# Patient Record
Sex: Male | Born: 1950 | Race: White | Hispanic: No | Marital: Married | Smoking: Never smoker
Health system: Southern US, Community
[De-identification: ages and names within clinical notes are randomized; demographics above are authoritative.]

## PROBLEM LIST (undated history)

## (undated) DIAGNOSIS — M5136 Other intervertebral disc degeneration, lumbar region: Secondary | ICD-10-CM

## (undated) DIAGNOSIS — M51369 Other intervertebral disc degeneration, lumbar region without mention of lumbar back pain or lower extremity pain: Secondary | ICD-10-CM

## (undated) DIAGNOSIS — E669 Obesity, unspecified: Secondary | ICD-10-CM

## (undated) DIAGNOSIS — M1990 Unspecified osteoarthritis, unspecified site: Secondary | ICD-10-CM

## (undated) DIAGNOSIS — I2510 Atherosclerotic heart disease of native coronary artery without angina pectoris: Secondary | ICD-10-CM

## (undated) DIAGNOSIS — E119 Type 2 diabetes mellitus without complications: Secondary | ICD-10-CM

## (undated) DIAGNOSIS — Z955 Presence of coronary angioplasty implant and graft: Secondary | ICD-10-CM

## (undated) DIAGNOSIS — Z973 Presence of spectacles and contact lenses: Secondary | ICD-10-CM

## (undated) DIAGNOSIS — I739 Peripheral vascular disease, unspecified: Secondary | ICD-10-CM

## (undated) DIAGNOSIS — Z87820 Personal history of traumatic brain injury: Secondary | ICD-10-CM

## (undated) DIAGNOSIS — C801 Malignant (primary) neoplasm, unspecified: Secondary | ICD-10-CM

## (undated) DIAGNOSIS — I10 Essential (primary) hypertension: Secondary | ICD-10-CM

## (undated) DIAGNOSIS — I8390 Asymptomatic varicose veins of unspecified lower extremity: Secondary | ICD-10-CM

## (undated) DIAGNOSIS — Z794 Long term (current) use of insulin: Secondary | ICD-10-CM

## (undated) DIAGNOSIS — D494 Neoplasm of unspecified behavior of bladder: Secondary | ICD-10-CM

## (undated) DIAGNOSIS — E785 Hyperlipidemia, unspecified: Secondary | ICD-10-CM

## (undated) HISTORY — PX: TONSILLECTOMY: SUR1361

## (undated) HISTORY — PX: JOINT REPLACEMENT: SHX530

## (undated) HISTORY — DX: Essential (primary) hypertension: I10

## (undated) HISTORY — PX: KNEE ARTHROSCOPY: SUR90

## (undated) HISTORY — PX: OTHER SURGICAL HISTORY: SHX169

## (undated) HISTORY — DX: Unspecified osteoarthritis, unspecified site: M19.90

## (undated) HISTORY — PX: POSTERIOR LUMBAR FUSION: SHX6036

## (undated) HISTORY — PX: CORONARY ANGIOPLASTY WITH STENT PLACEMENT: SHX49

## (undated) HISTORY — PX: BACK SURGERY: SHX140

## (undated) HISTORY — PX: SHOULDER ARTHROSCOPY WITH ROTATOR CUFF REPAIR AND SUBACROMIAL DECOMPRESSION: SHX5686

## (undated) HISTORY — DX: Obesity, unspecified: E66.9

## (undated) HISTORY — PX: APPENDECTOMY: SHX54

## (undated) HISTORY — PX: CORONARY ANGIOPLASTY: SHX604

## (undated) HISTORY — DX: Atherosclerotic heart disease of native coronary artery without angina pectoris: I25.10

## (undated) HISTORY — PX: TOTAL KNEE ARTHROPLASTY: SHX125

## (undated) HISTORY — DX: Hyperlipidemia, unspecified: E78.5

---
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
## 1993-01-17 HISTORY — PX: VARICOSE VEIN SURGERY: SHX832

## 2002-03-22 ENCOUNTER — Encounter (INDEPENDENT_AMBULATORY_CARE_PROVIDER_SITE_OTHER): Payer: Self-pay | Admitting: *Deleted

## 2002-03-22 ENCOUNTER — Ambulatory Visit (HOSPITAL_COMMUNITY): Admission: RE | Admit: 2002-03-22 | Discharge: 2002-03-22 | Payer: Self-pay | Admitting: Gastroenterology

## 2004-01-22 ENCOUNTER — Ambulatory Visit (HOSPITAL_BASED_OUTPATIENT_CLINIC_OR_DEPARTMENT_OTHER): Admission: RE | Admit: 2004-01-22 | Discharge: 2004-01-22 | Payer: Self-pay | Admitting: Orthopedic Surgery

## 2004-01-22 ENCOUNTER — Ambulatory Visit (HOSPITAL_COMMUNITY): Admission: RE | Admit: 2004-01-22 | Discharge: 2004-01-22 | Payer: Self-pay | Admitting: Orthopedic Surgery

## 2004-02-25 ENCOUNTER — Ambulatory Visit: Payer: Self-pay | Admitting: Physical Medicine & Rehabilitation

## 2004-02-25 ENCOUNTER — Inpatient Hospital Stay (HOSPITAL_COMMUNITY): Admission: RE | Admit: 2004-02-25 | Discharge: 2004-02-28 | Payer: Self-pay | Admitting: Orthopedic Surgery

## 2007-10-04 ENCOUNTER — Ambulatory Visit: Payer: Self-pay | Admitting: Cardiology

## 2007-10-10 ENCOUNTER — Ambulatory Visit: Payer: Self-pay

## 2007-10-18 ENCOUNTER — Ambulatory Visit: Payer: Self-pay | Admitting: Cardiology

## 2007-10-18 LAB — CONVERTED CEMR LAB
Basophils Absolute: 0 10*3/uL (ref 0.0–0.1)
Basophils Relative: 0.5 % (ref 0.0–3.0)
Calcium: 9.1 mg/dL (ref 8.4–10.5)
Creatinine, Ser: 1.1 mg/dL (ref 0.4–1.5)
Eosinophils Absolute: 0.3 10*3/uL (ref 0.0–0.7)
GFR calc non Af Amer: 73 mL/min
HCT: 38.9 % — ABNORMAL LOW (ref 39.0–52.0)
Hemoglobin: 13.6 g/dL (ref 13.0–17.0)
MCHC: 35 g/dL (ref 30.0–36.0)
MCV: 89.9 fL (ref 78.0–100.0)
Neutro Abs: 3.3 10*3/uL (ref 1.4–7.7)
RBC: 4.32 M/uL (ref 4.22–5.81)
aPTT: 42.5 s — ABNORMAL HIGH (ref 21.7–29.8)

## 2007-10-22 ENCOUNTER — Inpatient Hospital Stay (HOSPITAL_BASED_OUTPATIENT_CLINIC_OR_DEPARTMENT_OTHER): Admission: RE | Admit: 2007-10-22 | Discharge: 2007-10-22 | Payer: Self-pay | Admitting: Cardiology

## 2007-10-22 ENCOUNTER — Ambulatory Visit: Payer: Self-pay | Admitting: Cardiology

## 2007-10-24 ENCOUNTER — Ambulatory Visit (HOSPITAL_COMMUNITY): Admission: RE | Admit: 2007-10-24 | Discharge: 2007-10-25 | Payer: Self-pay | Admitting: Cardiology

## 2007-10-24 ENCOUNTER — Ambulatory Visit: Payer: Self-pay | Admitting: Cardiology

## 2007-10-24 DIAGNOSIS — Z955 Presence of coronary angioplasty implant and graft: Secondary | ICD-10-CM

## 2007-10-24 HISTORY — DX: Presence of coronary angioplasty implant and graft: Z95.5

## 2007-11-08 ENCOUNTER — Ambulatory Visit: Payer: Self-pay | Admitting: Cardiology

## 2008-04-07 DIAGNOSIS — I10 Essential (primary) hypertension: Secondary | ICD-10-CM

## 2008-04-07 DIAGNOSIS — E785 Hyperlipidemia, unspecified: Secondary | ICD-10-CM

## 2008-04-07 DIAGNOSIS — R5383 Other fatigue: Secondary | ICD-10-CM

## 2008-04-07 DIAGNOSIS — E119 Type 2 diabetes mellitus without complications: Secondary | ICD-10-CM

## 2008-04-07 DIAGNOSIS — R5381 Other malaise: Secondary | ICD-10-CM

## 2008-04-07 DIAGNOSIS — R0602 Shortness of breath: Secondary | ICD-10-CM

## 2008-04-09 ENCOUNTER — Encounter: Payer: Self-pay | Admitting: Cardiology

## 2008-04-09 ENCOUNTER — Ambulatory Visit: Payer: Self-pay | Admitting: Cardiology

## 2008-04-09 DIAGNOSIS — I2510 Atherosclerotic heart disease of native coronary artery without angina pectoris: Secondary | ICD-10-CM | POA: Insufficient documentation

## 2008-06-04 DIAGNOSIS — K649 Unspecified hemorrhoids: Secondary | ICD-10-CM | POA: Insufficient documentation

## 2008-06-05 ENCOUNTER — Ambulatory Visit: Payer: Self-pay | Admitting: Gastroenterology

## 2008-06-05 DIAGNOSIS — R05 Cough: Secondary | ICD-10-CM

## 2008-06-05 DIAGNOSIS — R221 Localized swelling, mass and lump, neck: Secondary | ICD-10-CM

## 2008-06-05 DIAGNOSIS — R220 Localized swelling, mass and lump, head: Secondary | ICD-10-CM

## 2008-06-05 DIAGNOSIS — K219 Gastro-esophageal reflux disease without esophagitis: Secondary | ICD-10-CM

## 2008-06-05 LAB — CONVERTED CEMR LAB
Basophils Relative: 0.5 % (ref 0.0–3.0)
Eosinophils Relative: 2.1 % (ref 0.0–5.0)
HCT: 39.8 % (ref 39.0–52.0)
Iron: 85 ug/dL (ref 42–165)
MCV: 89.2 fL (ref 78.0–100.0)
Monocytes Absolute: 0.5 10*3/uL (ref 0.1–1.0)
Monocytes Relative: 7 % (ref 3.0–12.0)
Neutrophils Relative %: 72.9 % (ref 43.0–77.0)
RBC: 4.46 M/uL (ref 4.22–5.81)
Saturation Ratios: 20.9 % (ref 20.0–50.0)
TSH: 1.39 microintl units/mL (ref 0.35–5.50)
WBC: 6.7 10*3/uL (ref 4.5–10.5)

## 2008-06-13 ENCOUNTER — Ambulatory Visit: Payer: Self-pay | Admitting: Gastroenterology

## 2008-06-13 DIAGNOSIS — K2970 Gastritis, unspecified, without bleeding: Secondary | ICD-10-CM | POA: Insufficient documentation

## 2008-06-13 DIAGNOSIS — K2990 Gastroduodenitis, unspecified, without bleeding: Secondary | ICD-10-CM

## 2008-08-15 ENCOUNTER — Encounter (INDEPENDENT_AMBULATORY_CARE_PROVIDER_SITE_OTHER): Payer: Self-pay | Admitting: *Deleted

## 2008-10-17 ENCOUNTER — Encounter: Payer: Self-pay | Admitting: Cardiology

## 2008-10-30 ENCOUNTER — Ambulatory Visit: Payer: Self-pay | Admitting: Gastroenterology

## 2008-10-30 DIAGNOSIS — R1011 Right upper quadrant pain: Secondary | ICD-10-CM

## 2008-10-31 LAB — CONVERTED CEMR LAB
Alkaline Phosphatase: 58 units/L (ref 39–117)
Bilirubin, Direct: 0 mg/dL (ref 0.0–0.3)
Ferritin: 68.9 ng/mL (ref 22.0–322.0)
Folate: 12.1 ng/mL
Iron: 95 ug/dL (ref 42–165)
Saturation Ratios: 23 % (ref 20.0–50.0)
Total Bilirubin: 0.7 mg/dL (ref 0.3–1.2)
Total Protein: 6.5 g/dL (ref 6.0–8.3)
Transferrin: 295.4 mg/dL (ref 212.0–360.0)

## 2008-11-04 ENCOUNTER — Ambulatory Visit (HOSPITAL_COMMUNITY): Admission: RE | Admit: 2008-11-04 | Discharge: 2008-11-04 | Payer: Self-pay | Admitting: Gastroenterology

## 2008-11-04 ENCOUNTER — Encounter: Payer: Self-pay | Admitting: Cardiology

## 2008-11-17 ENCOUNTER — Encounter: Admission: RE | Admit: 2008-11-17 | Discharge: 2008-11-17 | Payer: Self-pay | Admitting: Otolaryngology

## 2008-11-17 ENCOUNTER — Encounter: Payer: Self-pay | Admitting: Gastroenterology

## 2008-11-19 ENCOUNTER — Encounter: Payer: Self-pay | Admitting: Gastroenterology

## 2008-11-27 ENCOUNTER — Ambulatory Visit (HOSPITAL_COMMUNITY): Admission: RE | Admit: 2008-11-27 | Discharge: 2008-11-27 | Payer: Self-pay | Admitting: Otolaryngology

## 2008-11-27 ENCOUNTER — Encounter (INDEPENDENT_AMBULATORY_CARE_PROVIDER_SITE_OTHER): Payer: Self-pay | Admitting: Otolaryngology

## 2009-04-27 DIAGNOSIS — R079 Chest pain, unspecified: Secondary | ICD-10-CM | POA: Insufficient documentation

## 2009-04-27 DIAGNOSIS — R42 Dizziness and giddiness: Secondary | ICD-10-CM | POA: Insufficient documentation

## 2009-04-27 DIAGNOSIS — R9431 Abnormal electrocardiogram [ECG] [EKG]: Secondary | ICD-10-CM

## 2009-04-28 ENCOUNTER — Ambulatory Visit: Payer: Self-pay | Admitting: Cardiology

## 2009-05-04 ENCOUNTER — Encounter: Admission: RE | Admit: 2009-05-04 | Discharge: 2009-05-04 | Payer: Self-pay | Admitting: Podiatry

## 2009-05-04 ENCOUNTER — Telehealth (INDEPENDENT_AMBULATORY_CARE_PROVIDER_SITE_OTHER): Payer: Self-pay | Admitting: *Deleted

## 2009-05-05 ENCOUNTER — Ambulatory Visit: Payer: Self-pay | Admitting: Cardiovascular Disease

## 2009-05-05 ENCOUNTER — Ambulatory Visit: Payer: Self-pay

## 2009-05-05 ENCOUNTER — Encounter: Payer: Self-pay | Admitting: Cardiology

## 2009-05-05 ENCOUNTER — Encounter (HOSPITAL_COMMUNITY): Admission: RE | Admit: 2009-05-05 | Discharge: 2009-07-17 | Payer: Self-pay | Admitting: Cardiology

## 2009-11-12 ENCOUNTER — Ambulatory Visit (HOSPITAL_BASED_OUTPATIENT_CLINIC_OR_DEPARTMENT_OTHER): Admission: RE | Admit: 2009-11-12 | Discharge: 2009-11-12 | Payer: Self-pay | Admitting: Orthopedic Surgery

## 2010-01-17 HISTORY — PX: COLONOSCOPY: SHX174

## 2010-02-18 NOTE — Letter (Signed)
Summary: [facility] Office Note  [facility] Note   Imported By: Roderic Ovens 05/01/2009 13:37:19  _____________________________________________________________________  External Attachment:    Type:   Image     Comment:   External Document

## 2010-02-18 NOTE — Progress Notes (Signed)
Summary: Nuclear Pre-procedure  Phone Note Outgoing Call   Call placed by: Milana Na, EMT-P,  May 04, 2009 11:26 AM Summary of Call: Reviewed information on Myoview Information Sheet (see scanned document for further details).  Spoke with patient.     Nuclear Med Background Indications for Stress Test: Evaluation for Ischemia, Stent Patency   History: Abnormal EKG, Angioplasty, Heart Catheterization, Myocardial Perfusion Study, Stents  History Comments: 09/09 MPS EF 61% mild anterobasal ischemia 10/10 Heart Cath EF 60% 1 vessel Dz residual N/O CAD (LAD) 10/10 Stent Dx (LAD)  Symptoms: Chest Pain, Diaphoresis, Fatigue    Nuclear Pre-Procedure Cardiac Risk Factors: Family History - CAD, Hypertension, Lipids, NIDDM Height (in): 70  Nuclear Med Study Referring MD:  T.Wall

## 2010-02-18 NOTE — Assessment & Plan Note (Signed)
Summary: Cardiology Nuclear Study  Nuclear Med Background Indications for Stress Test: Evaluation for Ischemia, Stent Patency, PTCA Patency   History: Abnormal EKG, Angioplasty, Heart Catheterization, Myocardial Perfusion Study, Stents  History Comments: 9/09 NWG:NFAO antero-basal ischemia, EF=61%; 10/10 PTCA/Stent-LAD, EF=60%  Symptoms: Chest Pain, Diaphoresis, Fatigue  Symptoms Comments: Last episode of ZH:YQMVHQ, 05/01/09.   Nuclear Pre-Procedure Cardiac Risk Factors: Carotid Disease, Family History - CAD, Hypertension, Lipids, NIDDM, Obesity Caffeine/Decaff Intake: None NPO After: 7:00 PM Lungs: Clear IV 0.9% NS with Angio Cath: 22g     IV Site: (R) AC IV Started by: Irean Hong RN Chest Size (in) 48     Height (in): 70 Weight (lb): 236 BMI: 33.98 Tech Comments: FBS 5:30 am =143.  Nuclear Med Study 1 or 2 day study:  1 day     Stress Test Type:  Stress Reading MD:  Charlton Haws, MD     Referring MD:  Valera Castle, MD Resting Radionuclide:  Technetium 36m Tetrofosmin     Resting Radionuclide Dose:  11 mCi  Stress Radionuclide:  Technetium 69m Tetrofosmin     Stress Radionuclide Dose:  33 mCi   Stress Protocol Exercise Time (min):  9:19 min     Max HR:  141 bpm     Predicted Max HR:  161 bpm  Max Systolic BP: 235 mm Hg     Percent Max HR:  87.58 %     METS: 10.4 Rate Pressure Product:  46962    Stress Test Technologist:  Rea College CMA-N     Nuclear Technologist:  Domenic Polite CNMT  Rest Procedure  Myocardial perfusion imaging was performed at rest 45 minutes following the intravenous administration of Myoview Technetium 57m Tetrofosmin.  Stress Procedure  The patient exercised for 9:19.  The patient stopped due to fatigue and denied any chest pain.  There were no diagnostic ST-T wave changes, there were nonspecific T-wave changes and occasional PAC's.  Myoview was injected at peak exercise and myocardial perfusion imaging was performed after a brief  delay.  QPS Raw Data Images:  Normal; no motion artifact; normal heart/lung ratio. Stress Images:  NI: Uniform and normal uptake of tracer in all myocardial segments. Rest Images:  Normal homogeneous uptake in all areas of the myocardium. Subtraction (SDS):  SDS 5 abnormal inferior base Transient Ischemic Dilatation:  .94  (Normal <1.22)  Lung/Heart Ratio:  .40  (Normal <0.45)  Quantitative Gated Spect Images QGS EDV:  101 ml QGS ESV:  40 ml QGS EF:  60 % QGS cine images:  normal  Findings Low risk nuclear study      Overall Impression  Exercise Capacity: Good exercise capacity. BP Response: Hypertensive blood pressure response. Clinical Symptoms: Dyspnea ECG Impression: 1 mm  ST segment depression in the inferior leads during recovery Overall Impression: Low risk stress nuclear study. Overall Impression Comments: Thinning of the inferior base not thought to be sifnificant.  No scintigraphic evidence of ishcemia.  ECG positive T  Appended Document: Cardiology Nuclear Study no ischemia as before stent. no need for cath. continue current meds.  Appended Document: Cardiology Nuclear Study PT AWARE./CY

## 2010-02-18 NOTE — Assessment & Plan Note (Signed)
Summary: f1y/chest pain/abn ekg/dizziness   Visit Type:  1 yr f/u Referring Provider:  Herb Grays, MD Primary Provider:  Herb Grays, MD  CC:  pt had sharp cp this past Friday..said he felt "clammy"..has extreme fatigue....  History of Present Illness: Chad Yu comes in today because of chest discomfort, extreme fatigue, and abnormal EKG.  He is currently 60 years of age. He has a history of coronary artery disease. He initially presented with dyspnea on exertion. Stress Myoview showed anterior basal ischemia. Cardiac catheterization showed a high-grade diagonal that was openedwith a drug-eluting stent.  Her last several weeks, and had extreme fatigue for no explain reason. He denies any fever, chills, melena or hematochezia. His appetite has not changed, he denies any polyuria, polydipsia. He has lost 20 pounds voluntarily.  He's also had some chest discomfort across his left shoulder and left breast. It is not exertion related. He has not had significant dyspnea on exertion.  Electrocardiogram in primary care early R-wave progression in V2 and V3. Looking back, this is not substantially changed.  Current Medications (verified): 1)  Bc Headache 325-16-95 Mg Tabs (Aspirin-Salicylamide-Caffeine) .... Take 2 Tablets By Mouth Two Times A Day As Needed 2)  Cyclobenzaprine Hcl 10 Mg Tabs (Cyclobenzaprine Hcl) .... Use As Directed.as Needed 3)  Metformin Hcl 500 Mg Tabs (Metformin Hcl) .... 2 Tab Two Times A Day 4)  Lovaza 1 Gm Caps (Omega-3-Acid Ethyl Esters) .... Take 1 Capsule By Mouth Two Times A Day 5)  Quinidine Sulfate 300 Mg Tabs (Quinidine Sulfate) .... As Needed 6)  Nitrostat 0.4 Mg Subl (Nitroglycerin) .Marland Kitchen.. 1 Tablet Under Tongue At Onset of Chest Pain; You May Repeat Every 5 Minutes For Up To 3 Doses.  Allergies: 1)  ! * Statins 2)  ! Lipitor (Atorvastatin) 3)  ! Lisinopril  Past History:  Past Medical History: Last updated: 04/27/2009 CHEST PAIN (ICD-786.50) ABNORMAL  ELECTROCARDIOGRAM (ICD-794.31) DIZZINESS (ICD-780.4) CAD, NATIVE VESSEL (ICD-414.01) HYPERLIPIDEMIA-MIXED (ICD-272.4) HYPERTENSION, UNSPECIFIED (ICD-401.9) FATIGUE / MALAISE (ICD-780.79) DYSPNEA (ICD-786.05) DM (ICD-250.00) GERD (ICD-530.81) ABDOMINAL PAIN-RUQ (ICD-789.01) GASTRITIS (ICD-535.50) SWELLING MASS OR LUMP IN HEAD AND NECK (ICD-784.2) COUGH (ICD-786.2) ADENOCARCINOMA, COLON, FAMILY HX (ICD-V16.0) HEMORRHOIDS (ICD-455.6)  Past Surgical History: Last updated: 04/27/2009 Right knee replaced in 2/06 varicose vein surgery in 1995.  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  Alcohol Use - no Daily Caffeine Use 2 cups/day Illicit Drug Use - no  Risk Factors: Smoking Status: never (04/07/2008)  Review of Systems       negative other than history of present illness  Vital Signs:  Patient profile:   60 year old male Height:      70 inches Weight:      67 pounds Pulse rhythm:   regular BP sitting:   118 / 70  (left arm) Cuff size:   large  Vitals Entered By: Danielle Rankin, CMA (April 28, 2009 1:35 PM)  Physical Exam  General:  obese.  he has lost some weight. No acute distress Head:  normocephalic and atraumatic Eyes:  PERRLA/EOM intact; conjunctiva and lids normal. Neck:  Neck supple, no JVD. No masses, thyromegaly or abnormal cervical nodes. Chest Jolaine Fryberger:  no  deformities or breast masses noted Lungs:  Clear bilaterally to auscultation and percussion. Heart:  Non-displaced PMI, chest non-tender; regular rate and rhythm, S1, S2 without murmurs, rubs or gallops.  Carotid upstroke normal, no bruit. Normal abdominal aortic size, no bruits. Femorals normal pulses, no bruits. Pedals normal pulses. No edema, no varicosities. Abdomen:  Bowel sounds positive; abdomen soft and non-tender without masses, organomegaly, or hernias noted. No hepatosplenomegaly. Msk:  Back normal, normal gait. Muscle strength and tone normal. Pulses:  pulses normal in all 4 extremities Extremities:  No clubbing or cyanosis. Neurologic:  Alert and oriented x 3. Skin:  Intact without lesions or rashes. Psych:  Normal affect.   EKG  Procedure date:  04/28/2009  Findings:      normal sinus rhythm, early R wave progression V2 V3  Impression & Recommendations:  Problem # 1:  CHEST PAIN (ICD-786.50) Assessment New This does not sound coronary. He did not have typical symptoms before. His fatigue unexplained bothers may worsen this does. He has lost weight he should ask him more energy. I will obtain a stress Myoview to rule out any obstructive coronary disease.    Aspirin 325 Mg Tabs (Aspirin) His updated medication list for this problem includes:    Nitrostat 0.4 Mg Subl (Nitroglycerin) .Marland Kitchen... 1 tablet under tongue at onset of chest pain; you may repeat every 5 minutes for up to 3 doses.  Orders: EKG w/ Interpretation (93000) Nuclear Stress Test (Nuc Stress Test)  Problem # 2:  ABNORMAL ELECTROCARDIOGRAM (ICD-794.31) Assessment: Unchanged  The following medications were removed from the medication list:    Aspirin 325 Mg Tabs (Aspirin) His updated medication list for this problem includes:    Quinidine Sulfate 300 Mg Tabs (Quinidine sulfate) .Marland Kitchen... As needed    Nitrostat 0.4 Mg Subl (Nitroglycerin) .Marland Kitchen... 1 tablet under tongue at onset of chest pain; you may repeat every 5 minutes for up to 3 doses.  Problem # 3:  CAD, NATIVE VESSEL (ICD-414.01)  The following medications were removed from the medication list:    Aspirin 325 Mg Tabs (Aspirin) His updated medication list  for this problem includes:    Nitrostat 0.4 Mg Subl (Nitroglycerin) .Marland Kitchen... 1 tablet under tongue at onset of chest pain; you may repeat every 5 minutes for up to 3 doses.  Problem # 4:  FATIGUE / MALAISE (ICD-780.79) Assessment: Deteriorated  Problem # 5:  DYSPNEA (ICD-786.05) Assessment: Improved  The following medications were removed from the medication list:    Aspirin 325 Mg Tabs (Aspirin)  Patient Instructions: 1)  Your physician recommends that you schedule a follow-up appointment in: YEAR WITH DR Daysha Ashmore 2)  Your physician recommends that you continue on your current medications as directed. Please refer to the Current Medication list given to you today. 3)  Your physician has requested that you have an exercise stress myoview.  For further information please visit [email or web address]  Please follow instruction sheet, as given. Prescriptions: NITROSTAT 0.4 MG SUBL (NITROGLYCERIN) 1 tablet under tongue at onset of chest pain; you may repeat every 5 minutes for up to 3 doses.  #25 x 9   Entered by:   Danielle Rankin, CMA   Authorized by:   Gaylord Shih, MD, [facility]   Signed by:   Danielle Rankin, CMA on 04/28/2009   Method used:   Electronically to        [id number] (ret[facility])       [address]  [place], [place]  [address]       Ph: [phone] or [phone]       Fax: [phone]   RxID:   [id number]

## 2010-02-19 NOTE — Letter (Signed)
Summary: [facility] Office Note  [facility] Note   Imported By: Roderic Ovens 05/01/2009 13:37:41  _____________________________________________________________________  External Attachment:    Type:   Image     Comment:   External Document

## 2010-03-31 LAB — BASIC METABOLIC PANEL
BUN: 20 mg/dL (ref 6–23)
Calcium: 9.3 mg/dL (ref 8.4–10.5)
Chloride: 105 mEq/L (ref 96–112)
Creatinine, Ser: 1.07 mg/dL (ref 0.4–1.5)
GFR calc Af Amer: >60 mL/min (ref >60)
GFR calc non Af Amer: >60 mL/min (ref >60)

## 2010-03-31 LAB — GLUCOSE, CAPILLARY: Glucose-Capillary: 113 mg/dL — ABNORMAL HIGH (ref 70–99)

## 2010-03-31 LAB — POCT HEMOGLOBIN-HEMACUE: Hemoglobin: 14.5 g/dL (ref 13.0–17.0)

## 2010-04-21 LAB — URINALYSIS, ROUTINE W REFLEX MICROSCOPIC
Bilirubin Urine: NEGATIVE
Glucose, UA: 100 mg/dL — AB
Hgb urine dipstick: NEGATIVE
Ketones, ur: NEGATIVE mg/dL
Nitrite: NEGATIVE
Protein, ur: NEGATIVE mg/dL
Specific Gravity, Urine: 1.018 (ref 1.005–1.030)
Urobilinogen, UA: 0.2 mg/dL (ref 0.0–1.0)
pH: 5 (ref 5.0–8.0)

## 2010-04-21 LAB — CBC
HCT: 39.6 % (ref 39.0–52.0)
Hemoglobin: 13.9 g/dL (ref 13.0–17.0)
MCHC: 35.1 g/dL (ref 30.0–36.0)
MCV: 89.7 fL (ref 78.0–100.0)
Platelets: 155 10*3/uL (ref 150–400)
RBC: 4.41 MIL/uL (ref 4.22–5.81)
RDW: 13.1 % (ref 11.5–15.5)
WBC: 5.9 K/uL (ref 4.0–10.5)

## 2010-04-21 LAB — BASIC METABOLIC PANEL WITH GFR
Chloride: 102 meq/L (ref 96–112)
GFR calc Af Amer: >60 mL/min (ref >60)
Potassium: 4.4 meq/L (ref 3.5–5.1)
Sodium: 135 meq/L (ref 135–145)

## 2010-04-21 LAB — GLUCOSE, CAPILLARY
Glucose-Capillary: 132 mg/dL — ABNORMAL HIGH (ref 70–99)
Glucose-Capillary: 139 mg/dL — ABNORMAL HIGH (ref 70–99)
Glucose-Capillary: 153 mg/dL — ABNORMAL HIGH (ref 70–99)

## 2010-04-21 LAB — BASIC METABOLIC PANEL
BUN: 15 mg/dL (ref 6–23)
CO2: 24 mEq/L (ref 19–32)
Calcium: 9.3 mg/dL (ref 8.4–10.5)
Creatinine, Ser: 0.98 mg/dL (ref 0.4–1.5)
GFR calc non Af Amer: >60 mL/min (ref >60)
Glucose, Bld: 151 mg/dL — ABNORMAL HIGH (ref 70–99)

## 2010-04-27 LAB — GLUCOSE, CAPILLARY: Glucose-Capillary: 98 mg/dL (ref 70–99)

## 2010-06-01 NOTE — Discharge Summary (Signed)
Chad Yu, Chad Yu             ACCOUNT NO.:  [id number]   MEDICAL RECORD NO.:  [id number]          PATIENT TYPE:  OIB   LOCATION:  6526                         FACILITY:  [facility]   PHYSICIAN:  Chad C. Wall, MD, FACCDATE OF BIRTH:  [birth year]   DATE OF ADMISSION:  10/24/2007  DATE OF DISCHARGE:  10/25/2007                               DISCHARGE SUMMARY   PRIMARY CARDIOLOGIST:  Chad Fus C. Daleen Squibb, MD, [facility]   PRIMARY CARE Chad Yu:  Chad R. Chad Scotland, MD   DISCHARGE DIAGNOSIS:  Unstable angina.   SECONDARY DIAGNOSES:  1. Coronary artery disease, status post successful percutaneous      coronary intervention and stenting of the first diagonal branch of      the left anterior descending with placement of 2.25 x 20-mm Taxus      Liberte Atom drug-eluting stent, this admission.  2. Type 2 diabetes mellitus.  3. Hyperlipidemia.  4. Hypertension.   ALLERGIES:  STATIN.  LIPITOR as caused myalgias in the past.  The  patient is unwilling to take alternate statins.   HISTORY OF PRESENT ILLNESS:  A 60 year old married Caucasian male who  recently had an abnormal stress test showing mild anterior basal  ischemia.  He subsequently underwent left heart cardiac catheterization  on October 22, 2007, revealing an 80% stenosis in the first diagonal, and  otherwise nonobstructive coronary artery disease.  He has EF of 60%.  Decision was made to pursue PCI on an outpatient basis.   HOSPITAL COURSE:  The patient presented to the cath lab on October 24, 2007, and underwent successful PCI and stenting of the first diagonal  branch of the old LAD with placement of 2.25 x 20-mm Taxus Liberte Atom  drug-eluting stent.  The patient tolerated this procedure well and  postprocedure, he has been ambulating without recurrent symptoms or  limitations.  He has been seen by cardiac rehab and will be discharged  home today in good condition.   We have discussed the possibility of initiating statin therapy with  Mr.  Chad Yu.  He has informed us that he has been treated with Lipitor in  the past and this caused significant myalgias.  At this point, he is not  willing to try alternate statin as they would like to continue taking  Lovaza, WelChol, and fibrate therapy, which he is tolerating just fine.  We will not be initiating statin at this time.   DISCHARGE LABORATORIES:  Hemoglobin 13.9, hematocrit 40.6, WBC is 6.6,  platelets 187, and MCV 90.7.  Sodium 129, potassium 4.0, chloride 97,  CO2 of 23, BUN 17, creatinine 1.0, glucose 128, and calcium 9.1.   DISPOSITION:  The patient is being discharged home today in good  condition.   FOLLOWUP PLANS AND APPOINTMENTS:  We would arrange follow up with Dr.  Valera Yu on November 08, 2007, at 12:15 p.m.  He is also to follow up  Dr. Collins Yu as previously scheduled.   DISCHARGE MEDICATIONS:  1. Aspirin 325 mg daily.  2. Plavix 75 mg daily.  3. Lisinopril 20 mg daily.  4. Lovaza 1 g two tablets daily.  5. Metformin 1000 mg b.i.d. to be resumed, October 27, 2007.  6. Nitroglycerin 0.4 mg sublingual p.r.n. chest pain.  7. Trilipix DR 135 mg daily.  8. WelChol 625 mg three tablets b.i.d.  9. Qualaquin 324 mg p.r.n.  10.BC powder p.r.n.   OUTSTANDING LABORATORY STUDIES:  None.   DURATION OF DISCHARGE ENCOUNTER:  45 minutes including physician time.      Nicolasa Ducking, ANP      Jesse Sans. Daleen Squibb, MD, [facility]  Electronically Signed    CB/MEDQ  D:  10/25/2007  T:  10/25/2007  Job:  [id number]   cc:   Chad Yu, M.D.

## 2010-06-01 NOTE — Assessment & Plan Note (Signed)
[facility] HEALTHCARE                            CARDIOLOGY OFFICE NOTE   SHELIA, MAGALLON                      MRN:          [id number]  DATE:11/08/2007                            DOB:          1950-03-02    Mr. Belluomini comes in today after having his coronary intervention.  He  presented with some dyspnea on exertion and some fatigue, very little  chest pain.  He had a positive stress Myoview.  He had stenting of the  first diagonal branch of the LAD with a Taxus Liberte Atom drug-eluting  stent.   He has felt a little bit better, but really cannot tell how much  difference since discharge.  He certainly not had any chest pain.   He denies any problems with his cath site.   His medications are unchanged, except now he is on Plavix.  He  understands the importance of staying on that.   PHYSICAL EXAMINATION:  VITAL SIGNS:  His blood pressure today was  122/78, his pulse is 80 and regular, and his weight is 247.  HEENT:  Unchanged.  NECK:  Carotid upstrokes are equal bilaterally without bruits, no JVD.  Thyroid is not enlarged.  Trachea is midline.  LUNGS:  Clear.  HEART:  Soft S1-S2.  PMI was hard to appreciate.  ABDOMEN:  Unchanged.  EXTREMITIES:  No edema.   EKG is essentially normal, except for some nonspecific T-wave changes.   ASSESSMENT AND PLAN:  Ms. Mcconville is doing well.  We will see him back  in 5 months.  If he has any symptoms, he will let us know.     Thomas C. Daleen Squibb, MD, [facility]  Electronically Signed    TCW/MedQ  DD: 11/08/2007  DT: 11/09/2007  Job #: [id number]   cc:   Tammy R. Collins Scotland, M.D.

## 2010-06-01 NOTE — Cardiovascular Report (Signed)
NAMEPRINCESTON, BLIZZARD             ACCOUNT NO.:  [id number]   MEDICAL RECORD NO.:  [id number]          PATIENT TYPE:  OIB   LOCATION:  1963                         FACILITY:  [facility]   PHYSICIAN:  Everardo Beals. Juanda Chance, MD, FACCDATE OF BIRTH:  05/24/1950   DATE OF PROCEDURE:  DATE OF DISCHARGE:  10/22/2007                            CARDIAC CATHETERIZATION   PAST MEDICAL HISTORY:  Mr. 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  He has multiple risk  factors including diabetes, hypertension, hyperlipidemia, excess weight,  and a family history with a brother who has had stent procedures.  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 Collins Scotland who arranged for him to  see Dr. Daleen Squibb.  He had an exercise Myoview scan which suggested a small  area of anterobasal ischemia.  For this reason, he is scheduled for  evaluation with catheterization.   PROCEDURE:  The procedure was performed via the right femoral artery, an  arterial sheath, and 4-French preformed coronary catheters.  A front  wall arterial puncture was performed and Omnipaque contrast was used.  The patient tolerated the procedure well and left the laboratory in  satisfactory condition.   RESULTS:  The aortic pressure was 138/77 with a mean of 101.  Left  ventricle pressure was 138/15.   Left main coronary artery:  Left main coronary artery was free of  significant disease.   Left anterior descending artery:  The left anterior descending artery  gave rise to 2 septal perforators and diagonal branch.  There was 40%  narrowing in the proximal LAD after the diagonal branch.  There was 80%  narrowing in the midportion of diagonal branch.  This appeared to be  about 2.25 vessel.   The circumflex artery:  The circumflex artery is a dominant vessel, gave  rise to small ramus branch, a marginal branch, a posterolateral  branch,  and a posterior descending branch.  These vessels were free of  significant disease.   The right coronary artery:  The right coronary artery is a nondominant  vessel, gave rise to a conus branch and the right ventricular branch.  These vessels were free of significant disease.   The left ventriculogram:  The left ventriculogram was performed in the  RAO projection showed a good wall motion with no areas of hypokinesis.  The estimated ejection fraction was 60%.   CONCLUSION:  Coronary artery disease with 40% narrowing in the proximal  LAD, 80% narrowing in the first diagonal branch of the LAD, no  significant obstruction in the dominant circumflex artery, and no  significant obstruction in the nondominant right coronary artery and  normal LV function.   RECOMMENDATIONS:  The patient has a lesion in the diagonal branch, which  is probably in the distribution of the ischemia on the Myoview scan.  He  does have some symptoms of chest pain, but his symptoms appear mild at  present.  I will review this with my colleagues before making  a decision  whether we should manage this with a stent procedure of the diagonal  branch versus the medical therapy.      Bruce Elvera Lennox Juanda Chance, MD, [facility]  Electronically Signed     BRB/MEDQ  D:  10/22/2007  T:  10/23/2007  Job:  [id number]   cc:   Tammy R. Collins Scotland, M.D.  [name], MD, [facility]

## 2010-06-01 NOTE — Assessment & Plan Note (Signed)
[facility] HEALTHCARE                            CARDIOLOGY OFFICE NOTE   ANTWANE, GROSE                      MRN:          [id number]  DATE:10/04/2007                            DOB:          [birth year]    I was asked by Dr. Herb Yu to consult on Chad Yu for an  abnormal EKG.   Chad Yu is a pleasant 60 year old married white male who had some  upper left chest pain described as a tightness going up into his left  side of his neck.  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   He went to see Chad Yu who checked a chest x-ray which was within  normal limits and also did an EKG.  EKG showed some small Q-waves  inferiorly with some ST-segment changes.  He was placed on some muscle  relaxant which actually since that time has helped.   He denies any exertional chest tightness or pressure.  He does have some  dyspnea on exertion.   He has a really bad family history with a brother having several stents  in his early 22s.  His father also had heart disease at age 74-60.   He also has diabetes, is overweight.   He does not smoke.  He does not exercise.  He does not have high  cholesterol far as he knows.   He has no known drug allergies.   He is currently on metformin 500 mg p.o. b.i.d., prednisone 20 mg p.o.  daily, cyclobenzaprine 10 mg, and ibuprofen 800 mg.   SOCIAL HISTORY:  He does not smoke.  He does not drink.   SURGICAL HISTORY:  Right knee replacement 2005, varicose vein surgery in  1995.   FAMILY HISTORY:  As above.   REVIEW OF SYSTEMS:  Other than in the HPI is negative.  He denies any  orthopnea, PND, peripheral edema, tachy palpitations, presyncope,  syncope.   PHYSICAL EXAMINATION:  VITAL SIGNS:  His blood pressure today is 150/90,  his pulse 69 and regular.  He is 6 feet tall, weighs 251 pounds.  HEENT:  Essentially normal except for earlobe creases.   His dentition is  in need to repair.  Oral mucosa otherwise normal.  Carotid upstrokes are  equal bilaterally without bruits.  No JVD.  Thyroid is not enlarged.  Trachea is midline.  NECK:  Supple.  LUNGS:  Clear to auscultation and percussion.  HEART:  A nondisplaced PMI.  Soft S1 and S2.  No gallop.  ABDOMEN:  Protuberant, good bowel sounds.  No midline bruit.  No  hepatomegaly.  EXTREMITIES:  No cyanosis, clubbing, or edema.  Pulses are intact.  NEUROLOGIC:  Intact.  SKIN:  Unremarkable.   I repeated an EKG today which is essentially normal.  He has some low  voltage in the inferior leads with slight left axis deviation.   I had a long talk with Chad Yu.  With his age, markedly positive  family history, obesity, and diabetes, I suspect that he has coronary  disease.  The question is whether or not he has any obstructive disease.  After a long discussion, I have recommend exercise rest stress Myoview.  He does heavy duty manual work, and I will make sure he is safe doing  so.   If his stress test is abnormal, he will need a cath.  If not, we will  see him back on a p.r.n. basis.     Chad C. Daleen Squibb, MD, [facility]  Electronically Signed    TCW/MedQ  DD: 10/04/2007  DT: 10/05/2007  Job #: [id number]   cc:   Chad Yu, M.D.

## 2010-06-01 NOTE — Assessment & Plan Note (Signed)
[facility]                            CARDIOLOGY OFFICE NOTE   Chad Yu, Chad Yu                      MRN:          [id number]  DATE:10/18/2007                            DOB:          1950/10/22    CHIEF COMPLAINT:  Shortness of breath and chest tightness.   HISTORY OF PRESENT ILLNESS:  Chad Yu is a 60 year old married white  male who I saw in the office October 04, 2007.  He was referred by Dr.  Herb Yu with the above complaints.   His EKG showed small Q-waves inferiorly with some ST-segment changes.   He has multiple cardiac risk factors including family history, diabetes,  obesity, and sedentary lifestyle.   We performed an exercise stress Myoview.  He went 8 minutes, stopped  secondary to fatigue.  He had no significant ST-segment changes.  Scan  showed EF of 61% with normal contractility and thickening in all areas  of myocardium.  He had some potential mild anterior basal ischemia.   He has been arranged to have a cath.   PAST MEDICAL HISTORY:   ALLERGIES:  He has no known drug allergies.   MEDICATIONS:  He is currently on metformin 500 b.i.d., prednisone 20 mg  a day, cyclobenzaprine 10 mg a day, and ibuprofen 800 mg p.r.n.   He has no dye allergy.   SOCIAL HISTORY:  He does not smoke.  He does not drink.  He is married.   SURGICAL HISTORY:  Right knee replaced in 2005, varicose vein surgery in  1995.   FAMILY HISTORY:  As above.   REVIEW OF SYSTEMS:  Other than HPI is negative.  Please refer to my  original clinic note.   PHYSICAL EXAMINATION:  VITAL SIGNS:  His blood pressure is 130/84, his  pulse 72 and regular, and weight is 247 down 4.  HEENT:  Essentially normal left earlobe creases.  Dentition is in need  of repair.  Oral mucosa is normal.  NECK:  Carotid upstrokes are equal bilaterally without bruits.  No JVD.  Thyroid is not enlarged.  Trachea is midline.  Neck is supple.  LUNGS:  Clear to  auscultation and percussion.  HEART:  Nondisplaced PMI.  Soft S1 and S2.  No gallop.  ABDOMEN:  Protuberant.  No midline bruit.  No organomegaly.  Good bowel  sounds.  EXTREMITIES:  No cyanosis, clubbing, or edema.  Pulses are intact.  NEUROLOGIC:  Grossly intact.  SKIN:  Unremarkable.   EKG in our office actually was essentially normal.  He does have low-  voltage with slight left axis deviation.   ASSESSMENT AND PLAN:  I had a long talk with Chad Yu and his wife  today.  I am suspicious that he may have an obstructive lesion in his  coronaries.  We will arrange for him have an outpatient cath early next  week.  Indications, risks, and potential benefits were discussed.  He  agrees to proceed.     Thomas C. Daleen Squibb, MD, [facility]  Electronically Signed    TCW/MedQ  DD: 10/18/2007  DT: 10/18/2007  Job #: [id number]  cc:   Chad Yu, M.D.

## 2010-06-01 NOTE — Cardiovascular Report (Signed)
Chad Yu, Chad Yu             ACCOUNT NO.:  [id number]   MEDICAL RECORD NO.:  [id number]          PATIENT TYPE:  OIB   LOCATION:  6526                         FACILITY:  [facility]   PHYSICIAN:  Bruce R. Juanda Chance, MD, FACCDATE OF BIRTH:  March 20, 1950   DATE OF PROCEDURE:  10/24/2007  DATE OF DISCHARGE:                            CARDIAC CATHETERIZATION   CLINICAL HISTORY:  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  He recently was seen by Dr. Daleen Squibb for  evaluation of chest pain.  He had a Myoview scan, which showed  anterolateral ischemia.  He had a catheterization in the [facility] lab, which  showed a tight lesion in the diagonal branch of the LAD and was brought  back today for intervention.   PROCEDURE:  The procedure was performed by the right femoral artery  arterial sheath and a 6-French Q-4 guiding catheter with side holes.  The patient had been given antiemetics, bolus infusion, and had been  previously loaded with Plavix and given chewable aspirin.  We passed a  Prowater wire into the diagonal branch across the lesion without  difficulty.  We predilated with a 2.0 x 15-mm apex balloon performing  two inflations up to 8 atmospheres for 30 seconds.  We then deployed a  2.25 x 20-mm Taxus Atom Liberte stent deploying this with one inflation  of 10 atmospheres for 30 seconds.  We postdilated a 2.5 x 15-mm Livingston  Voyager performing three inflations of 15 atmospheres for 30 seconds.  Final diagnostics were then performed through the guiding catheter.  The  patient tolerated the procedure well and left laboratory in satisfactory  condition.  There was TIMI II flow after the stent deployment and this  responded to IC verapamil and restored TIMI III flow.   RESULTS:  Initially, stenosis in the diagonal branch was estimated at  90%.  Following stenting this improved to 0%.   CONCLUSION:  Successful PCI of the diagonal branch of the LAD using a  Taxus  Liberte drug-eluting stent with improvement in the central  narrowing from 90% to 0%.   DISPOSITION:  The patient returned to post angio room for further  observation.      Bruce Elvera Lennox Juanda Chance, MD, [facility]  Electronically Signed     BRB/MEDQ  D:  10/24/2007  T:  10/25/2007  Job:  [id number]   cc:   [name], MD, [facility]  Tammy R. Collins Scotland, M.D.

## 2010-06-04 NOTE — Discharge Summary (Signed)
Chad Yu, PAYSON NO.:  [id number]   MEDICAL RECORD NO.:  [id number]          PATIENT TYPE:  INP   LOCATION:  5016                         FACILITY:  [facility]   PHYSICIAN:  Loreta Ave, M.D. DATE OF BIRTH:  1950-11-10   DATE OF ADMISSION:  02/25/2004  DATE OF DISCHARGE:  02/28/2004                                 DISCHARGE SUMMARY   FINAL DIAGNOSES:  1.  Status post right total knee replacement for end-stage degenerative      joint disease.  2.  Post hemorrhagic anemia.  3.  Long term use of anticoagulants.  4.  Type 2 diabetes mellitus.   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 Presents  to our office for preoperative evaluation for total knee replacement. He has  progressive worsening of symptoms with failed response to conservative  treatment. Significant decrease in his daily activities due to the ongoing  complaint.   HOSPITAL COURSE:  On February 25, 2004 the patient was taken to the [facility] operating room and a right total knee replacement procedure performed.  Surgeon was Loreta Ave, M.D. and assistant Fayrene Fearing _______. Anesthesia  general. Estimated blood loss minimal. Two Hemovac drains were placed. There  were no surgical or anesthesia complications and patient was transferred to  recovery in stable condition. Tourniquet time 90 minutes. Pharmacy protocol  Coumadin.  On February 26, 2004 the patient had good pain control.  Temperature 100.5, pulse 103, respirations 20, blood pressure 123/50.  Hemoglobin 11.0. Sodium 133, potassium 4.2, chloride 100, CO2 27, BUN 20,  creatinine 1.2, glucose 191.  Dressing clean, dry and intact. Calf nontender. Neurovascular was intact.  We ordered PT and OT consults. On February 27, 2004 the patient complained  of some dizziness with standing after getting a PCA pump. Temperature 101.3,  pulse 92,  respirations 20, blood pressure 139/49. Hemoglobin 9.8. Glucose  152. INR 1.7. Wound looked good, staples intact. No signs of infection. Calf  nontender and neurovascular was intact distally. Hemovac drains x2 were  pulled. Discontinued Foley, morphine PCA. Heparin locked IV.  On February 28, 2004 the patient was doing extremely well. Temperature 99.6,  pulse 86, respirations 20, blood pressure 145/83. He has been ambulating  well and completed stairs. Hemoglobin 11.9. Glucose 137. INR 1.6. The wound  looked good, staples intact. No signs of infection. Calf nontender and  neurovascular intact distally. He is ready for discharge home.   DISCHARGE MEDICATIONS:  Percocet, Coumadin, Robaxin.   CONDITION ON DISCHARGE:  Good and stable.   DISPOSITION:  Discharged home.   The patient will work with Home Health physical therapy/occupational therapy  to improve ambulation, range of motion and strengthening. Home Health R. N.  to monitor Coumadin x4 weeks for deep venous thrombosis prophylaxis. Staples  to be removed 2 weeks postoperatively. Follow up with Dr. Eulah Pont in 2-3  weeks for recheck. Return sooner if needed.       DFM/MEDQ  D:  07/03/2004  T:  07/03/2004  Job:  [id number] 

## 2010-06-04 NOTE — Op Note (Signed)
Chad Yu, ZIMBELMAN NO.:  [id number]   MEDICAL RECORD NO.:  [id number]          PATIENT TYPE:  INP   LOCATION:  2899                         FACILITY:  [facility]   PHYSICIAN:  Loreta Ave, M.D. DATE OF BIRTH:  [birth year]   DATE OF PROCEDURE:  02/25/2004  DATE OF DISCHARGE:                                 OPERATIVE REPORT   PREOPERATIVE DIAGNOSIS:  1.  End stage degenerative arthritis, right knee.  2.  Varus alignment and flexion contracture.   POSTOPERATIVE DIAGNOSIS:  1.  End stage degenerative arthritis, right knee.  2.  Varus alignment and flexion contracture.   OPERATION PERFORMED:  Right total knee replacement with soft tissue  balancing.  Cemented #9 posterior stabilized femoral component.  Cemented #9  tibial component with a 12 mm flex insert.  Cemented #7 resurfacing patella  component.   SURGEON:  Loreta Ave, M.D.   ASSISTANT:  Garnette Scheuermann, P.A.   ANESTHESIA:  General.   ESTIMATED BLOOD LOSS:  Minimal.   TOURNIQUET TIME:  One hour, 30 minutes.   SPECIMENS:  Excised bone and soft tissue.   CULTURES:  None.   COMPLICATIONS:  None.   DRESSINGS:  Sterile, compressive with knee immobilizer.   DRAINS:  Hemovac x2.   PROCEDURE IN DETAIL:  The patient was brought to the operating room and  placed on the operating table in the supine position.  After adequate  anesthesia had been obtained tourniquet applied to the upper aspect of the  right leg.  Examined, 5 degrees of flexion contraction, 5 degrees varus  correctable to neutral.  Flexion a little bit better than 90 degrees.   After being prepped and draped in the usual sterile fashion, exsanguinated  with Esmarch a tourniquet was inflated to 350 mmHg.  A straight incision  above the patella down to tibial tubercle.  Medial parapatellar arthrotomy.  Numerous loose bodies and articular spurs, redness of menisci appreciated.  Ligaments all removed.  Distal femur exposed.  A 10 mm  resection set at 5  degrees of valgus.  Sized for #9 component.  Jig was put in place and  definitive cuts made.  Recess exam, all spurs removed.  All evident loose  bodies were removed.  Tibia exposed.  Tibial spine removed with the saw.  The intramedullary guide was placed.  Proximal cut 5 degrees posterior slant  removing 5 mm off the medial side. It gave me a nice bed of good bone  perpendicular to the shaft.  Patella was assessed and best treated with a  resurfacing rather than a recessed component, 10 mm resection.  Sized and  drilled for a #7 component.  The trial was put in place, #9 femur, #9 on  tibia and a #7 on the patella.  With the 12 mm flex insert, full extension,  full flexion, good alignment, no lift-off inflection and good stability in  flexion and extension.  The tibia was marked for appropriate rotation and  hand reamed.  All trials were removed.   Copious irrigation with pulse irrigating device.  Recess exam and all loose  fragments  removed.  Cement placed on all components and were firmly seated.  Polyethylene attached to the tibia.  Patella component firmly cemented.  All  excess cement was removed.  The knee reduced.  Once the cement hardened he  was re-examined.  Good patella and femoral tracking.  Nicely balanced knee  at 5 degrees of valgus after medial cuts were released which had been done  previously.  Full extension, full flexion, good stability.  Wound irrigated.  Hemovacs were placed and brought out through separate stab wounds.   Arthrotomy closed with #1 Vicryl, skin and subcutaneous tissue with Vicryl  and staples.  Large wounds injection of Marcaine as was the knee, Hemovacs  were clamped.  Sterile compression dressing applied.  Tourniquet was  deflated and removed. Knee immobilizer applied.  Anesthesia reversed and he  was brought to the recovery room.  He tolerated the surgery well with no  complications.      DFM/MEDQ  D:  02/25/2004  T:   02/25/2004  Job:  [id number]

## 2010-06-04 NOTE — Op Note (Signed)
   NAMENOCHOLAS, DAMASO                         ACCOUNT NO.:  [id number]   MEDICAL RECORD NO.:  [id number]                   PATIENT TYPE:  AMB   LOCATION:  ENDO                                 FACILITY:  [facility]   PHYSICIAN:  Petra Kuba, M.D.                 DATE OF BIRTH:  1950/02/09   DATE OF PROCEDURE:  03/22/2002  DATE OF DISCHARGE:                                 OPERATIVE REPORT   PROCEDURE PERFORMED:  Colonoscopy.   INDICATIONS FOR PROCEDURE:  Patient with Dad with questionable metastatic  adenocarcinoma of the abdomen due for colonic screening.  Consent was signed  after risks, benefits, methods and options were thoroughly discussed in the  office.   MEDICINES USED:  Demerol 70 and Versed 7.   DESCRIPTION OF PROCEDURE:  Rectal inspection was pertinent for external  hemorrhoids, small.  Digital exam was negative.  Video colonoscope inserted  and easily advanced around the colon to the cecum.  No obvious abnormality  was seen on insertion.  This did not require any abdominal pressure or any  position changes in fact the scope was inserted a short ways into the  terminal ileum which was normal.  Photo documentation was obtained.  The  scope was slowly withdrawn.  On slow withdraw through the colon, the prep  was fairly adequate.  Did require a moderate amount of washing and  suctioning for adequate visualization.  On slow withdrawn back to the rectum  no abnormalities were seen, specifically no polyps, diverticula or masses.  Once back in the rectum, the scope was retroflexed pertinent for some  internal hemorrhoids.  The scope was readvanced a short ways up the left  side of the colon.  Air was suctioned and scope removed.  The patient  tolerated the procedure well.  There was no obvious immediate complication.   ENDOSCOPIC DIAGNOSES:  1. Internal and external hemorrhoids, small.  2. Otherwise within normal limits to the terminal ileum.   PLAN:  Happy to see back  p.r.n.  Return care to Dr. Collins Scotland for the customary  health care maintenance to include yearly rectals and guaiacs.  Otherwise  probable recheck screening in five years.                                               Petra Kuba, M.D.    MEM/MEDQ  D:  03/22/2002  T:  03/22/2002  Job:  [id number]   cc:   Tammy R. Collins Scotland, M.D.  [address]  [place]  [place] [address]  Fax: [phone]

## 2010-06-04 NOTE — Op Note (Signed)
NAMEPROSPERO, MAHNKE             ACCOUNT NO.:  [id number]   MEDICAL RECORD NO.:  [id number]          PATIENT TYPE:  AMB   LOCATION:  DSC                          FACILITY:  [facility]   PHYSICIAN:  Loreta Ave, M.D. DATE OF BIRTH:  1950/05/30   DATE OF PROCEDURE:  01/22/2004  DATE OF DISCHARGE:                                 OPERATIVE REPORT   PREOPERATIVE DIAGNOSES:  Medial meniscal tear with tricompartmental  degenerative arthritis, left knee.   POSTOPERATIVE DIAGNOSES:  Medial meniscal tear with tricompartmental  degenerative arthritis, left knee.   OPERATION PERFORMED:  Left examination under anesthesia, arthroscopy,  partial medial meniscectomy.  Diffuse superficial chondroplasty all three  compartments.   SURGEON:  Loreta Ave, M.D.   ASSISTANT:  Genene Churn. Denton Meek.   ANESTHESIA:  General.   ESTIMATED BLOOD LOSS:  Minimal.   TOURNIQUET TIME:  Not employed.   SPECIMENS:  None.   CULTURES:  None.   COMPLICATIONS:  None.   DRESSING:  Soft compressive.   DESCRIPTION OF PROCEDURE:  The patient was brought to the operating room and  placed on the operating table in supine position.  After adequate anesthesia  had been obtained, the left knee examined.  Full motion, good stability.  Patellofemoral crepitus but good tracking.  Positive medial McMurray's.  Tourniquet and leg holder applied.  Leg prepped and draped in the usual  sterile fashion.  Three portals were created, one superolateral, one each  medial and lateral parapatellar.  Inflow catheter introduced.  Knee  distended.  Arthroscope introduced, knee inspected.  Medial meniscus torn  off the posterior attachment extending into the posterior third,  displaceable.  Posterior third removed, tapered into remaining meniscus  salvaging reasonable amount in the anterior and middle thirds.  Moderate  grade 3 changes, weightbearing dome, femoral condyle, debrided to a stable  surface.  Patellofemoral joint had  some grade 2, mild grade 3 changes,  especially lateral trochlea which was debrided.  Good tracking.  Lateral  meniscus and lateral compartment intact other than some mild focal grade 2  changes on the condyle which were debrided.  Cruciate ligaments were intact.  At completion, the entire knee examined.  No other  findings appreciated.  Instruments and fluid removed.  Portals of the knee  injected with Marcaine.  Portals were closed with 4-0 nylon.  Sterile  compressive dressing applied.  Anesthesia reversed.  Brought to recovery  room.  Tolerated surgery well.  No complications.      Valentino Saxon   DFM/MEDQ  D:  01/22/2004  T:  01/22/2004  Job:  [id number]

## 2010-08-25 ENCOUNTER — Inpatient Hospital Stay (HOSPITAL_COMMUNITY)
Admission: EM | Admit: 2010-08-25 | Discharge: 2010-08-27 | DRG: 251 | Disposition: A | Discharge status: Home / Self Care | Payer: 59 | Attending: Cardiology | Admitting: Cardiology

## 2010-08-25 ENCOUNTER — Emergency Department (HOSPITAL_COMMUNITY): Payer: 59

## 2010-08-25 DIAGNOSIS — Z7902 Long term (current) use of antithrombotics/antiplatelets: Secondary | ICD-10-CM

## 2010-08-25 DIAGNOSIS — I10 Essential (primary) hypertension: Secondary | ICD-10-CM | POA: Diagnosis present

## 2010-08-25 DIAGNOSIS — I200 Unstable angina: Secondary | ICD-10-CM | POA: Diagnosis present

## 2010-08-25 DIAGNOSIS — E119 Type 2 diabetes mellitus without complications: Secondary | ICD-10-CM | POA: Diagnosis present

## 2010-08-25 DIAGNOSIS — R51 Headache: Secondary | ICD-10-CM | POA: Diagnosis not present

## 2010-08-25 DIAGNOSIS — M1990 Unspecified osteoarthritis, unspecified site: Secondary | ICD-10-CM | POA: Diagnosis present

## 2010-08-25 DIAGNOSIS — R079 Chest pain, unspecified: Secondary | ICD-10-CM

## 2010-08-25 DIAGNOSIS — Z9861 Coronary angioplasty status: Secondary | ICD-10-CM

## 2010-08-25 DIAGNOSIS — Z8249 Family history of ischemic heart disease and other diseases of the circulatory system: Secondary | ICD-10-CM

## 2010-08-25 DIAGNOSIS — Z79899 Other long term (current) drug therapy: Secondary | ICD-10-CM

## 2010-08-25 DIAGNOSIS — I2510 Atherosclerotic heart disease of native coronary artery without angina pectoris: Principal | ICD-10-CM | POA: Diagnosis present

## 2010-08-25 DIAGNOSIS — Z7982 Long term (current) use of aspirin: Secondary | ICD-10-CM

## 2010-08-25 DIAGNOSIS — E785 Hyperlipidemia, unspecified: Secondary | ICD-10-CM | POA: Diagnosis present

## 2010-08-25 DIAGNOSIS — E781 Pure hyperglyceridemia: Secondary | ICD-10-CM | POA: Diagnosis present

## 2010-08-25 DIAGNOSIS — Z96659 Presence of unspecified artificial knee joint: Secondary | ICD-10-CM

## 2010-08-25 DIAGNOSIS — Z888 Allergy status to other drugs, medicaments and biological substances status: Secondary | ICD-10-CM

## 2010-08-25 LAB — DIFFERENTIAL
Lymphs Abs: 1.9 10*3/uL (ref 0.7–4.0)
Monocytes Relative: 9 % (ref 3–12)
Neutro Abs: 4.9 10*3/uL (ref 1.7–7.7)
Neutrophils Relative %: 64 % (ref 43–77)

## 2010-08-25 LAB — CBC
HCT: 43 % (ref 39.0–52.0)
Hemoglobin: 15.5 g/dL (ref 13.0–17.0)
MCH: 31.3 pg (ref 26.0–34.0)
MCV: 86.7 fL (ref 78.0–100.0)
RBC: 4.96 MIL/uL (ref 4.22–5.81)

## 2010-08-25 LAB — GLUCOSE, CAPILLARY: Glucose-Capillary: 111 mg/dL — ABNORMAL HIGH (ref 70–99)

## 2010-08-25 LAB — POCT I-STAT TROPONIN I: Troponin i, poc: 0 ng/mL (ref 0.00–0.08)

## 2010-08-25 LAB — CK TOTAL AND CKMB (NOT AT ARMC)
CK, MB: 3.2 ng/mL (ref 0.3–4.0)
CK, MB: 2.9 ng/mL (ref 0.3–4.0)
Relative Index: 2.8 — ABNORMAL HIGH (ref 0.0–2.5)

## 2010-08-25 LAB — BASIC METABOLIC PANEL
CO2: 26 mEq/L (ref 19–32)
Calcium: 10.5 mg/dL (ref 8.4–10.5)
Creatinine, Ser: 0.82 mg/dL (ref 0.50–1.35)
Glucose, Bld: 114 mg/dL — ABNORMAL HIGH (ref 70–99)

## 2010-08-26 DIAGNOSIS — I2510 Atherosclerotic heart disease of native coronary artery without angina pectoris: Secondary | ICD-10-CM

## 2010-08-26 LAB — CBC
HCT: 41.6 % (ref 39.0–52.0)
Hemoglobin: 14.5 g/dL (ref 13.0–17.0)
MCHC: 34.9 g/dL (ref 30.0–36.0)

## 2010-08-26 LAB — LIPID PANEL: Cholesterol: 233 mg/dL — ABNORMAL HIGH (ref 0–200)

## 2010-08-26 LAB — CARDIAC PANEL(CRET KIN+CKTOT+MB+TROPI)
Relative Index: INVALID (ref 0.0–2.5)
Troponin I: <0.3 ng/mL (ref <0.30)

## 2010-08-26 LAB — GLUCOSE, CAPILLARY
Glucose-Capillary: 115 mg/dL — ABNORMAL HIGH (ref 70–99)
Glucose-Capillary: 143 mg/dL — ABNORMAL HIGH (ref 70–99)
Glucose-Capillary: 200 mg/dL — ABNORMAL HIGH (ref 70–99)

## 2010-08-26 LAB — BASIC METABOLIC PANEL
Calcium: 9.6 mg/dL (ref 8.4–10.5)
Chloride: 100 mEq/L (ref 96–112)
Creatinine, Ser: 0.88 mg/dL (ref 0.50–1.35)
GFR calc Af Amer: >60 mL/min (ref >60)
GFR calc non Af Amer: >60 mL/min (ref >60)

## 2010-08-26 LAB — PROTIME-INR: INR: 1.03 (ref 0.00–1.49)

## 2010-08-26 LAB — POCT ACTIVATED CLOTTING TIME: Activated Clotting Time: 457 seconds

## 2010-08-26 LAB — HEMOGLOBIN A1C
Hgb A1c MFr Bld: 8.6 % — ABNORMAL HIGH (ref <5.7)
Mean Plasma Glucose: 200 mg/dL — ABNORMAL HIGH (ref <117)

## 2010-08-27 DIAGNOSIS — I200 Unstable angina: Secondary | ICD-10-CM

## 2010-08-27 LAB — CBC
HCT: 39.4 % (ref 39.0–52.0)
MCHC: 35.3 g/dL (ref 30.0–36.0)
MCV: 86.8 fL (ref 78.0–100.0)
RDW: 12.8 % (ref 11.5–15.5)

## 2010-08-27 LAB — GLUCOSE, CAPILLARY
Glucose-Capillary: 172 mg/dL — ABNORMAL HIGH (ref 70–99)
Glucose-Capillary: 198 mg/dL — ABNORMAL HIGH (ref 70–99)

## 2010-08-27 LAB — BASIC METABOLIC PANEL
BUN: 14 mg/dL (ref 6–23)
Creatinine, Ser: 0.9 mg/dL (ref 0.50–1.35)
Potassium: 3.9 mEq/L (ref 3.5–5.1)

## 2010-08-31 ENCOUNTER — Encounter: Payer: Self-pay | Admitting: *Deleted

## 2010-09-01 ENCOUNTER — Encounter: Payer: Self-pay | Admitting: Cardiology

## 2010-09-02 ENCOUNTER — Encounter: Payer: Self-pay | Admitting: Cardiology

## 2010-09-02 ENCOUNTER — Ambulatory Visit (INDEPENDENT_AMBULATORY_CARE_PROVIDER_SITE_OTHER): Payer: 59 | Admitting: Cardiology

## 2010-09-02 VITALS — BP 138/78 | HR 62 | Ht 72.0 in | Wt 235.0 lb

## 2010-09-02 DIAGNOSIS — E785 Hyperlipidemia, unspecified: Secondary | ICD-10-CM

## 2010-09-02 DIAGNOSIS — I10 Essential (primary) hypertension: Secondary | ICD-10-CM

## 2010-09-02 DIAGNOSIS — I2510 Atherosclerotic heart disease of native coronary artery without angina pectoris: Secondary | ICD-10-CM

## 2010-09-02 NOTE — Cardiovascular Report (Signed)
NAMEBYRL, LATIN NO.:  [id number]  MEDICAL RECORD NO.:  [id number]  LOCATION:  6526                         FACILITY:  [facility]  PHYSICIAN:  Chad Morton. Riley Kill, MD, FACCDATE OF BIRTH:  1950/12/06  DATE OF PROCEDURE:  08/26/2010 DATE OF DISCHARGE:                           CARDIAC CATHETERIZATION   INDICATIONS:  Chad Yu presented with unstable angina.  He previously had a stent placed to the diagonal.  Just in front of the stent is a subtotal occlusion over a segmental area.  It was done from the wrist by Dr. Shirlee Latch.  We discussed the case with the patient and I met with his wife.  Preparations were made for attempted percutaneous intervention.  PROCEDURE:  Percutaneous transluminal coronary angioplasty of the diagonal.  DESCRIPTION OF PROCEDURE:  The patient had a 5-French sheath in for the radial approach.  Bivalirudin was given according to protocol and prasugrel was administered as a 60 mg oral load.  We exchanged a 5- [place] sheath for a 6-French sheath and a JL-3.0 guide was utilized.  We were able to get access with good backup.  ACT was checked and found to be appropriate.  We initially tried to cross with a traverse wire, and it would not cross the near total occlusion just proximal to the stent. A PT2 wire was then attempted followed by a Luge, and with both wires we were unable to cross.  We then placed 2 wires at the lesion, hopefully one in a false channel and one in a true channel, but again we were unable to cross.  I then took a small 1.5-mm 6-mm length over the wire balloon with a Prowater wire.  The wire was taken up to the lesion and the balloon was fashioned down to the site.  We were subsequently able to get the wire across the lesion.  However, multiple attempts to try to get the balloon across were unsuccessful.  Several dilatations were performed right at the entry site, but not quite across the lesion into the stent.  We tried  to reposition the wire perhaps in the center portion of the stent.  We ultimately reached approximately 46 minutes of fluoro time, and 3.37 gray.  The patient remained perfectly stable, but given the radiation load it was felt that further attempts should cease. The case was stopped.  The patient remained stable throughout.  He was taken to the holding area after placement of the TR band with good hemostasis.  There were no major complications.  ANGIOGRAPHIC DATA:  Prior to the procedure, there was a 50% area followed by a subtotal area leading into the stent.  The stent itself is a Taxus stent that remains widely patent prior to the distal bifurcation.  Following attempts, there was no change in the appearance of the lesion.  CONCLUSIONS:  Unsuccessful attempt at percutaneous intervention of the diagonal branch due to inability to cross with a balloon.  DISPOSITION:  We reached our flow limit.  Further attempts to cross were not tried.  I discussed the case with Dr. Excell Seltzer, and we may elect to retry at a later date especially if the patient remains symptomatic.  I will discuss this  with the family in detail.     Chad Morton. Riley Kill, MD, [facility]     TDS/MEDQ  D:  08/26/2010  T:  08/26/2010  Job:  [id number]  cc:   Marca Ancona, MD Tammy R. Collins Scotland, M.D. [facility]  Electronically Signed by Shawnie Pons MD [facility] on 09/02/2010 09:54:01 AM

## 2010-09-02 NOTE — Patient Instructions (Signed)
Your physician has requested that you have an exercise tolerance test. For further information please visit [email or web address] Please also follow instruction sheet, as given. With Dr. Daleen Squibb.

## 2010-09-02 NOTE — Discharge Summary (Signed)
NAMEVIJAY, Chad Yu NO.:  [id number]  MEDICAL RECORD NO.:  [id number]  LOCATION:  6526                         FACILITY:  [facility]  PHYSICIAN:  Arturo Morton. Riley Kill, MD, FACCDATE OF BIRTH:  29-Aug-1950  DATE OF ADMISSION:  08/25/2010 DATE OF DISCHARGE:  08/27/2010                              DISCHARGE SUMMARY   PRIMARY CARE PROVIDER:  Tammy R. Collins Scotland, MD  PRIMARY CARDIOLOGIST:  Jesse Sans. Wall, MD, [facility]  DISCHARGE DIAGNOSIS:  Unstable angina.  SECONDARY DIAGNOSES: 1. Coronary disease status post prior stenting of the diagonal in     2009, with attempted percutaneous coronary intervention of the same     vessel this admission. 2. Hypertension. 3. Hyperlipidemia with history of STATIN intolerance. 4. Type 2 diabetes mellitus. 5. Degenerative joint disease. 6. Hypertriglyceridemia. 7. History of migraine headaches.  ALLERGIES:  Intolerance to STATIN and LISINOPRIL.  PROCEDURES:  Left heart cardiac catheterization performed on August 26, 2010, revealing 99% stenosis in the first diagonal proximal to the previously placed stent which itself was patent.  Right coronary artery was small and nondominant.  The diagonal branch was successfully wired, however balloon was not able to be successfully passed and after multiple attempts in 46 minutes of total fluoro time, the procedure was aborted.  HISTORY OF PRESENT ILLNESS:  A 60 year old male with prior history of coronary artery disease status post stenting of the first diagonal in 2009, who was in his usual state of health until several weeks prior to admission he began to experience increasing fatigue as well as exertional chest discomfort.  The patient went to see his primary care provider on August 25, 2010, for evaluation of leg cramps, but while in the office developed chest discomfort, and was sent to the [facility] ED where ECG was nonacute and point-of-care markers were negative.  The patient admitted for  evaluation.  HOSPITAL COURSE:  The patient ruled out for MI.  He had no recurrent chest discomfort.  Given exertional symptoms, decision was made to pursue cardiac catheterization which took place on August 26, 2010, revealing 99% proximal stenosis in the first diagonal.  The previously placed diagonal stent was widely patent.  Films were reviewed and decision made was made to pursue percutaneous intervention of the proximal diagonal branch.  This area was successfully wired, however, the interventional team could not successfully pass a balloon and after multiple attempts, decision was made to abort the procedure. Postprocedure, the patient complained of the left frontal headache without neurologic changes and this was subsequently managed with oral narcotic.  The patient has had no recurrence of a headache.  The patient has been ambulating well without recurrent symptoms or limitations.  His therapy going forward has been extensively discussed with him, his wife, and Dr. Riley Kill, and for the time being, we are planning trial of medical therapy.  We have added low-dose long-acting nitrate as well as beta-blocker and Plavix therapy to his regimen. Further, given history of STATIN intolerance with hypertriglyceridemia and triglycerides of 674, we would plan to add TriCor therapy at discharge.  The patient will be discharged home today in good condition, and had to follow up with Dr. Riley Kill, next week.  DISCHARGE LABORATORY DATA:  Hemoglobin 13.9, hematocrit 39.4, WBC 7.3, platelets 170, INR 1.03.  Sodium 137, potassium 3.9, chloride 99, CO2 of 26, BUN 14, creatinine 0.90, glucose 147, calcium 9.0, hemoglobin A1c 8.6, CK 97, MB 2.8, troponin-I less than 0.30, total cholesterol 233, triglycerides 674, HDL 33.  MRSA screen was negative.  DISPOSITION:  The patient will be discharged home today in good condition.  FOLLOWUP PLANS AND APPOINTMENTS:  The patient follows up with Dr. Riley Kill  on September 02, 2010, at 11:30 a.m.  Follow up with Dr. Herb Grays as scheduled for further management of diabetes.  DISCHARGE MEDICATIONS: 1. Imdur 30 mg half-tablet daily. 2. Metoprolol 25 mg b.i.d. 3. Nitroglycerin 0.4 mg p.r.n. chest pain. 4. Plavix 75 mg daily. 5. TriCor 145 mg at bedtime. 6. Aspirin 81 mg daily. 7. Metformin 500 mg 3 tabs in the a.m., 2 tabs in the p.m., 2 resumed     on August 28, 2010.  OUTSTANDING LAB STUDIES:  None.  DURATION OF DISCHARGE ENCOUNTER:  Sixty minutes including physician time.     Chad Yu, ANP   ______________________________ Arturo Morton. Riley Kill, MD, [facility]    CB/MEDQ  D:  08/27/2010  T:  08/27/2010  Job:  [id number]  cc:   Tammy R. Collins Scotland, M.D.  Electronically Signed by Chad Yu ANP on 08/30/2010 03:11:57 PM Electronically Signed by Shawnie Pons MD [facility] on 09/02/2010 09:54:05 AM

## 2010-09-04 NOTE — Progress Notes (Signed)
HPI:  Patient is in for a follow up visit.  He is stable in general.  He underwent cath which revealed a high grade stenosis proximal to his previously placed diagonal stent (DES).  We were able to cross after some difficulty, but unable to cross with a balloon despite good distal wire position.  The case had been done diagnostically from the radial approach by one of my colleagues, but the backup with reasonable.  A 1.25 balloon could not cross, and we abandoned the procedure because of radiation concerns at about 45 minutes.  We used several angles, and tried to keep on a larger field of view to minimize radiation.  I have also notified patient of the things to watch out for, given his size.  Please see cath report for information.  Since discharge, he really has not had much in the way of chest discomfort.  He, his wife, and I all reviewed the films today in detail, and I pointed out specifics to them.  We discussed options.  He really feels pretty good.  He had a headache post cath, but this happened last time as well.  He has tolerated so far the isosorbide dinitrate.    Current Outpatient Prescriptions  Medication Sig Dispense Refill  . aspirin 81 MG tablet Take 81 mg by mouth daily.        . clopidogrel (PLAVIX) 75 MG tablet Take 75 mg by mouth daily.        . fenofibrate (TRICOR) 145 MG tablet Take 145 mg by mouth daily.        . isosorbide mononitrate (IMDUR) 30 MG 24 hr tablet Take 15 mg by mouth daily.        . metFORMIN (GLUCOPHAGE) 500 MG tablet Take by mouth. 3 tablets in the morning, 2 tablets in the evening       . metoprolol tartrate (LOPRESSOR) 25 MG tablet Take 25 mg by mouth 2 (two) times daily.        . nitroGLYCERIN (NITROLINGUAL) 0.4 MG/SPRAY spray Place 1 spray under the tongue every 5 (five) minutes as needed.          Allergies  Allergen Reactions  . Atorvastatin     REACTION: myalgies  . Lisinopril   . Statins     Past Medical History  Diagnosis Date  . Coronary  artery disease   . Diabetes mellitus   . Hyperlipidemia   . Hypertension   . Degenerative joint disease     Past Surgical History  Procedure Date  . Right knee replacement  in 2005   . Post varicose surgery-1995   . Left rotator cuff repair     No family history on file.  History   Social History  . Marital Status: Married    Spouse Name: N/A    Number of Children: N/A  . Years of Education: N/A   Occupational History  . Not on file.   Social History Main Topics  . Smoking status: Never Smoker   . Smokeless tobacco: Not on file  . Alcohol Use: No  . Drug Use: Not on file  . Sexually Active: Not on file   Other Topics Concern  . Not on file   Social History Narrative   The patient lives in [place] with his wife. He works in [employer]. He denies tobacco or alcohol abuse . He denies regular exercise.    ROS: Please see the HPI.  All other systems reviewed and negative.  PHYSICAL EXAM:  BP 138/78  Pulse 62  Ht 6' (1.829 m)  Wt 235 lb (106.595 kg)  BMI 31.87 kg/m2  General: Well developed, well nourished, in no acute distress. Head:  Normocephalic and atraumatic. Neck: no JVD Lungs: Clear to auscultation and percussion. Heart: Normal S1 and S2.  No murmur, rubs or gallops.  Abdomen:  Normal bowel sounds; soft; non tender; no organomegaly Pulses: Pulses normal in all 4 extremities. Extremities: No clubbing or cyanosis. No edema. Neurologic: Alert and oriented x 3. Skin:  No changes to suggest burn.  EKG:  NSR.  WNL.  No acute changes.  ASSESSMENT AND PLAN:

## 2010-09-05 NOTE — Assessment & Plan Note (Signed)
Appears to be controlled.  

## 2010-09-05 NOTE — Assessment & Plan Note (Addendum)
See overview regarding anatomy.  Patient had a prior DES to a modest size diag.  This remains open, but proximal to the stent is a lesion that is severe, and the diffuse disease extends back to the ostium.  Despite successfully crossing with a wire and documentation of access to the distal lumen, unable to advance balloon across the lesion which has its most severe contour at the stent entry site.  We could reattempt from the groin with better backup, and likely cross, but the disease in the ostium is also problematic in that the angle of takeoff from the LAD does not leave a good landing zone.  He is only minimally symptomatic at this point, and based on this it seems most prudent to first attempt a trial of medical therapy.  We could retry, but the patient is also of the opinion that medical therapy would be best given the chance of success, and less than optimal stenting characteristics of the lesion.  We will have him do a GXT with Dr. Daleen Squibb, and adjust his isosorbide dose.   He will have follow up in the clinic here.  I also made him aware that we will need to watch out for any rashes on his back, and the he should report them to [place].

## 2010-09-05 NOTE — Assessment & Plan Note (Signed)
Will need to be assessed by Dr. Daleen Squibb in follow up, with appropriate adjustments to medications.

## 2010-09-21 ENCOUNTER — Ambulatory Visit: Payer: 59 | Admitting: Cardiology

## 2010-09-22 ENCOUNTER — Ambulatory Visit (INDEPENDENT_AMBULATORY_CARE_PROVIDER_SITE_OTHER): Payer: 59 | Admitting: Physician Assistant

## 2010-09-22 DIAGNOSIS — I2510 Atherosclerotic heart disease of native coronary artery without angina pectoris: Secondary | ICD-10-CM

## 2010-09-22 MED ORDER — METOPROLOL TARTRATE 50 MG PO TABS: 50.0000 mg | ORAL_TABLET | Freq: Two times a day (BID) | ORAL | Status: DC

## 2010-09-22 MED ORDER — ISOSORBIDE MONONITRATE ER 30 MG PO TB24: 30.0000 mg | ORAL_TABLET | Freq: Every day | ORAL | Status: DC

## 2010-09-22 NOTE — Patient Instructions (Signed)
Increase Metoprolol to 50 mg twice daily.  You can take 2 of the 25 mg to make 50 mg. Schedule follow up with Dr. Daleen Squibb in 3-4 weeks.

## 2010-09-22 NOTE — Progress Notes (Signed)
Exercise Treadmill Test  Pre-Exercise Testing Evaluation Rhythm: normal sinus  Rate: 75   PR:  .17 QRS:  .08  QT:  .37 QTc: .41     Test  Exercise Tolerance Test Ordering MD: Shawnie Pons, MD  Interpreting MD:  Tereso Newcomer, PA-C  Unique Test No: 1  Treadmill:  1  Indication for ETT: known ASHD  Contraindication to ETT: No   Stress Modality: exercise - treadmill  Cardiac Imaging Performed: non   Protocol: standard Bruce - maximal  Max BP: 186/76  Max MPHR (bpm):  160 85% MPR (bpm):  136  MPHR obtained (bpm): 131 % MPHR obtained:  82  Reached 85% MPHR (min:sec):   Total Exercise Time (min-sec):  9:10  Workload in METS:  9.6 Borg Scale: 9.6  Reason ETT Terminated:  patient's desire to stop    ST Segment Analysis At Rest: normal ST segments - no evidence of significant ST depression With Exercise: significant ischemic ST depression  Other Information Arrhythmia:  No Angina during ETT:  absent (0) Quality of ETT:  diagnostic  ETT Interpretation:  abnormal - evidence of ST depression consistent with ischemia  Comments: Fair exercise tolerance. Submaximal ETT with inability to reach target heart rate. No chest pain. Normal BP response to exercise. Inferolateral ST segment depression consistent with ischemia noted at maximal effort.  Recommendations: Discussed with Dr. Riley Kill. Patient had no chest pain. Test done with patient taking metoprolol. EKG changes expected with known CAD. Will advance medical therapy by increasing metoprolol to 50 mg BID. Schedule follow up with Dr. Daleen Squibb.

## 2010-10-02 NOTE — Cardiovascular Report (Signed)
  NAMEFISHER, HARGADON NO.:  [id number]  MEDICAL RECORD NO.:  [id number]  LOCATION:                                 FACILITY:  PHYSICIAN:  Marca Ancona, MD      DATE OF BIRTH:  [birth year]  DATE OF PROCEDURE: DATE OF DISCHARGE:                           CARDIAC CATHETERIZATION   PROCEDURES: 1. Left heart catheterization. 2. Coronary angiography.  OPERATOR:  Marca Ancona, MD  INDICATIONS:  Unstable angina.  PROCEDURE NOTE:  After informed consent was obtained, the patient underwent Allen testing on his right wrist.  The right ulnar artery gave good collateral circulation to the radial side of the hand constituting positive test.  The right radial area was then sterilely prepped and draped.  Lidocaine 1% was used to locally anesthetize the right radial area.  Right radial artery was entered using modified Seldinger technique and a 5-French arterial sheath was placed.  The patient received 3 mg of intraarterial verapamil and 4000 units of IV heparin. The right coronary artery was engaged using the JR-4 catheter.  The left coronary artery was engaged using the JL-3.5 catheter and left ventricle was entered using angled pigtail catheter.  There were no complications.  FINDINGS: 1. Hemodynamics:  LV 113/0.  Aorta 106/64. 2. Left ventriculography.  Left ventriculogram was not done. 3. Right coronary artery:  The right coronary artery was a small,     nondominant vessel. 4. Left main:  The left main was short with no angiographic coronary     artery disease. 5. Left circumflex:  The left circumflex system was dominant.  There     were luminal irregularities.  There was a left-sided PDA as well as a     large PLOM. 6. LAD system:  The LAD itself had luminal irregularities.  There was     a moderate-sized first diagonal.  The first diagonal had a drug-     eluting stent about midway down the vessel.  This was patent,     however, there was a 99% stenosis  just prior to the stent with TIMI     2 flow down the rest of the vessel.  The stenosis extended almost     back, but not quite back to the ostium.  IMPRESSION:  The patient has 99% stenosis in the proximal moderate-sized first diagonal with TIMI 2 flow in the diagonal that is the culprit of his symptoms.  We will plan for percutaneous intervention to the diagonal today.  He will receive prasugrel.     Marca Ancona, MD     DM/MEDQ  D:  08/26/2010  T:  08/26/2010  Job:  [id number]  cc:   Tammy R. Collins Scotland, M.D.  Electronically Signed by Marca Ancona MD on 10/02/2010 10:54:23 PM

## 2010-10-02 NOTE — H&P (Signed)
Chad Yu, Chad Yu NO.:  [id number]  MEDICAL RECORD NO.:  [id number]  LOCATION:  3728                         FACILITY:  [facility]  PHYSICIAN:  Chad Ancona, MD      DATE OF BIRTH:  11-06-1950  DATE OF ADMISSION:  08/25/2010 DATE OF DISCHARGE:                             HISTORY & PHYSICAL   PRIMARY CARDIOLOGIST:  Chad Fus C. Daleen Squibb, MD, [facility]  PRIMARY CARE PROVIDER:  Tammy R. Collins Scotland, MD  CHIEF COMPLAINT:  Chest pain.  HISTORY OF PRESENT ILLNESS:  This is a 60 year old Caucasian gentleman with history of coronary artery disease status post cardiac catheterization in 2009, showing a high-grade diagonal, does open with a drug-eluting stent.  The patient also has a history of diabetes mellitus, hypertension, and hyperlipidemia.  He is currently taking metformin for his diabetes mellitus, but is on no other antihypertensives, aspirin, or statin.  The patient is intolerant to statins.  He states that over the past several years, he has been doing well.  Per note, the patient did have a stress test April, although results are unavailable at this time.  Per patient, they were fine with no need for further workup.  The patient states that over the last several weeks, he has noticed increased fatigue.  He has also noticed progressive exertional chest pain.  He states the pain is a pressure/stabbing sensation in the center of his chest.  He also has bilateral arm fatigue.  The sensation do resolve after approximately 10 minutes of rest.  This pain only comes on with heavy exertion.  He denies pain with rest or ambulation.  He states he may be mildly diaphoretic, but denied shortness of breath, nausea, or vomiting.  He states that fatigue is similar to his prior angina, but he does not recall having chest pain at that time.  The patient went to see Dr. Yehuda Yu for evaluation of leg cramps, but then the chest pain was discussed and Dr. Yehuda Yu sent patient to the emergency  department for further evaluation of unstable angina.  In the emergency department, the patient's EKG demonstrated normal sinus rhythm with nonspecific anterior T-wave flattening, but no acute changes.  First set of cardiac markers were negative x1.  The patient is currently without chest pain.  Vital signs are stable.  Cardiology was asked to admit the patient for further evaluation.  PAST MEDICAL HISTORY: 1. Coronary artery disease status post percutaneous coronary     intervention and stenting of the first diagonal branch of the left     anterior descending with a 2.25 x 20 mm Taxus Liberte Atom drug-     eluting stent. 2. Non-insulin-dependent diabetes mellitus. 3. Hyperlipidemia.     a.     Statin intolerance. 4. Hypertension. 5. Degenerative joint disease. 6. Status post right knee replacement in 2005. 7. Status post varicose vein surgery 1995. 8. Status post left rotator cuff repair.  SOCIAL HISTORY:  The patient lives in [place] with his wife.  He works in [employer].  He denies tobacco or alcohol abuse.  He denies regular exercise.  FAMILY HISTORY:  Pertinent for premature coronary artery disease.  His father with his first myocardial infarction  in 30s.  His brother underwent percutaneous coronary intervention in his early 80s.  ALLERGIES/INTOLERANCES:  STATIN causing muscle aches and extreme fatigue.  HOME MEDICATIONS: 1. Metformin (dose unknown). 2. BC Powders as needed.  REVIEW OF SYSTEMS:  All pertinent positives and negatives as stated in HPI.  Other systems have been reviewed and are negative.  PHYSICAL EXAM:  VITAL SIGNS:  Temperature 97.9, pulse 65-84, respirations 16, blood pressure 132/78, O2 saturation 93% on room air. GENERAL:  This is a well-developed, well-nourished middle-aged gentleman.  He is no acute distress. HEENT:  Normal.  Neck is supple without bruit or JVD. HEART:  Regular rate and rhythm with S1 and S2.  S4 noted.  No  murmur. LUNGS:  Clear to auscultation bilaterally without wheezes, rales, or rhonchi. ABDOMEN:  Soft, nontender, positive bowel sounds x4. EXTREMITIES:  No clubbing, cyanosis, or edema. MUSCULOSKELETAL:  No joint deformity or effusions. NEUROLOGIC:  Alert and oriented x3, cranial nerves II-XII grossly intact.  Chest x-ray show no acute cardiopulmonary findings.  LABS:  Cardiac enzymes negative x1.  Sodium 139, potassium 4.4, BUN 24, creatinine 0.82, glucose 114.  WBC 7.8, hemoglobin 15.5, hematocrit 43.0, platelets 188.  ASSESSMENT AND PLAN:  This is a 60 year old Caucasian gentleman with history of coronary artery disease status post drug-eluting stent to the first diagonal who presents with symptoms worrisome for unstable angina as patient has had progressive exertional chest pain.  1.  Coronary artery disease, now his symptoms concerning for unstable angina.  The patient is currently without chest pain.  With his symptoms and prior coronary artery disease, we will proceed with left heart catheterization in the morning.  Risks, benefits, and indications have been discussed with the patient.  He agrees to proceed.  Cardiac markers will be cycled throughout the evening and heparin will be added if cardiac enzymes become positive or there is recurrent chest pain.  We will also start patient on low-dose aspirin, Lopressor, and lisinopril. We will hold off on adding a statin at this time secondary to intolerance, but can consider Niaspan versus Zetia at discharge.  2.  Diabetes mellitus.  At this time, we will hold patient's metformin secondary to left heart catheterization in the morning.  We will place the patient on a sliding scale insulin.  Further treatment will be dependent upon the above.     Chad Monarch, PA-C   ______________________________ Chad Ancona, MD    NB/MEDQ  D:  08/25/2010  T:  08/26/2010  Job:  [id number]  cc:   Chad C. Wall, MD, [facility] Chad R.  Collins Yu, M.D.  Electronically Signed by Alen Blew P.A. on 08/27/2010 01:44:17 PM Electronically Signed by Chad Ancona MD on 10/02/2010 10:52:29 PM

## 2010-10-18 LAB — CBC
Hemoglobin: 14.6
MCHC: 33.9
MCV: 90.7
Platelets: 187
RBC: 4.48
RBC: 4.75
WBC: 5.1
WBC: 6.6

## 2010-10-18 LAB — BASIC METABOLIC PANEL
BUN: 17
CO2: 26
Calcium: 9.1
Calcium: 9.5
Chloride: 97
Creatinine, Ser: 1
Creatinine, Ser: 1.02
GFR calc Af Amer: >60
GFR calc Af Amer: >60
GFR calc non Af Amer: >60
Sodium: 137

## 2010-10-18 LAB — POCT I-STAT GLUCOSE
Glucose, Bld: 112 — ABNORMAL HIGH
Operator id: 133881

## 2010-10-18 LAB — GLUCOSE, CAPILLARY
Glucose-Capillary: 120 — ABNORMAL HIGH
Glucose-Capillary: 147 — ABNORMAL HIGH
Glucose-Capillary: 192 — ABNORMAL HIGH
Glucose-Capillary: 211 — ABNORMAL HIGH

## 2010-10-19 ENCOUNTER — Ambulatory Visit (INDEPENDENT_AMBULATORY_CARE_PROVIDER_SITE_OTHER): Payer: 59 | Admitting: Cardiology

## 2010-10-19 ENCOUNTER — Encounter: Payer: Self-pay | Admitting: Cardiology

## 2010-10-19 VITALS — BP 122/76 | HR 60 | Ht 66.0 in | Wt 239.0 lb

## 2010-10-19 DIAGNOSIS — R9431 Abnormal electrocardiogram [ECG] [EKG]: Secondary | ICD-10-CM

## 2010-10-19 DIAGNOSIS — I2510 Atherosclerotic heart disease of native coronary artery without angina pectoris: Secondary | ICD-10-CM

## 2010-10-19 DIAGNOSIS — R079 Chest pain, unspecified: Secondary | ICD-10-CM

## 2010-10-19 NOTE — Assessment & Plan Note (Signed)
Stable. Discomfort he is having is not angina or ischemic. Reinforced the use of nitroglycerin and the characteristics of angina as well as an acute coronary syndrome. I will see him back in a year.

## 2010-10-19 NOTE — Progress Notes (Signed)
Chad Yu returns today for his history of coronary disease and chest pain.  Catheterization in August resulted in a inability to open a 99% proximal diagonal stenosis. This lesion was proximal to a previously placed stent. There were no complications.  He has a low-risk stress test and medical therapy is being continued. He is chest pain at rest but none with exertion. There is no ischemic equivalence. He has not had to use nitroglycerin.  Social history, family history, past medical history all the same from previous evaluations.  His examination shows stable vital signs. No other changes noted from previous exams.

## 2010-10-19 NOTE — Assessment & Plan Note (Signed)
Noncardiac

## 2010-10-19 NOTE — Patient Instructions (Signed)
Your physician wants you to follow-up in: 1 year with Dr. Wall. You will receive a reminder letter in the mail two months in advance. If you don't receive a letter, please call our office to schedule the follow-up appointment.  

## 2010-12-15 ENCOUNTER — Encounter (HOSPITAL_COMMUNITY): Payer: Self-pay | Admitting: *Deleted

## 2010-12-15 ENCOUNTER — Inpatient Hospital Stay (HOSPITAL_COMMUNITY)
Admission: EM | Admit: 2010-12-15 | Discharge: 2010-12-17 | DRG: 310 | Disposition: A | Discharge status: Home / Self Care | Payer: 59 | Source: Ambulatory Visit | Attending: Cardiology | Admitting: Cardiology

## 2010-12-15 ENCOUNTER — Emergency Department (HOSPITAL_COMMUNITY): Payer: 59

## 2010-12-15 DIAGNOSIS — E119 Type 2 diabetes mellitus without complications: Secondary | ICD-10-CM | POA: Diagnosis present

## 2010-12-15 DIAGNOSIS — R001 Bradycardia, unspecified: Secondary | ICD-10-CM

## 2010-12-15 DIAGNOSIS — Z7982 Long term (current) use of aspirin: Secondary | ICD-10-CM

## 2010-12-15 DIAGNOSIS — I498 Other specified cardiac arrhythmias: Principal | ICD-10-CM | POA: Diagnosis present

## 2010-12-15 DIAGNOSIS — E785 Hyperlipidemia, unspecified: Secondary | ICD-10-CM | POA: Diagnosis present

## 2010-12-15 DIAGNOSIS — I10 Essential (primary) hypertension: Secondary | ICD-10-CM | POA: Diagnosis present

## 2010-12-15 DIAGNOSIS — Z9861 Coronary angioplasty status: Secondary | ICD-10-CM

## 2010-12-15 DIAGNOSIS — Z888 Allergy status to other drugs, medicaments and biological substances status: Secondary | ICD-10-CM

## 2010-12-15 DIAGNOSIS — R079 Chest pain, unspecified: Secondary | ICD-10-CM

## 2010-12-15 DIAGNOSIS — Z7902 Long term (current) use of antithrombotics/antiplatelets: Secondary | ICD-10-CM

## 2010-12-15 DIAGNOSIS — I2510 Atherosclerotic heart disease of native coronary artery without angina pectoris: Secondary | ICD-10-CM

## 2010-12-15 LAB — COMPREHENSIVE METABOLIC PANEL
AST: 29 U/L (ref 0–37)
Albumin: 4.2 g/dL (ref 3.5–5.2)
Alkaline Phosphatase: 54 U/L (ref 39–117)
BUN: 25 mg/dL — ABNORMAL HIGH (ref 6–23)
Potassium: 4.4 mEq/L (ref 3.5–5.1)
Sodium: 138 mEq/L (ref 135–145)
Total Protein: 7.4 g/dL (ref 6.0–8.3)

## 2010-12-15 LAB — CARDIAC PANEL(CRET KIN+CKTOT+MB+TROPI)
CK, MB: 3.9 ng/mL (ref 0.3–4.0)
Relative Index: 3.6 — ABNORMAL HIGH (ref 0.0–2.5)
Troponin I: <0.3 ng/mL (ref <0.30)

## 2010-12-15 LAB — CBC
HCT: 40.7 % (ref 39.0–52.0)
Hemoglobin: 14.2 g/dL (ref 13.0–17.0)
MCV: 87.3 fL (ref 78.0–100.0)
RBC: 4.66 MIL/uL (ref 4.22–5.81)
WBC: 7.4 10*3/uL (ref 4.0–10.5)

## 2010-12-15 LAB — PROTIME-INR: Prothrombin Time: 13.8 seconds (ref 11.6–15.2)

## 2010-12-15 LAB — PRO B NATRIURETIC PEPTIDE: Pro B Natriuretic peptide (BNP): 66.2 pg/mL (ref 0–125)

## 2010-12-15 MED ORDER — ASPIRIN 81 MG PO TABS: 81.0000 mg | ORAL_TABLET | Freq: Every day | ORAL | Status: DC

## 2010-12-15 MED ORDER — METOPROLOL TARTRATE 25 MG PO TABS
25.0000 mg | ORAL_TABLET | Freq: Two times a day (BID) | ORAL | Status: DC
  Administered 2010-12-16 – 2010-12-17 (×3): 25 mg via ORAL
  Filled 2010-12-15 (×5): qty 1

## 2010-12-15 MED ORDER — ACETAMINOPHEN 325 MG PO TABS: 650.0000 mg | ORAL_TABLET | ORAL | Status: DC | PRN

## 2010-12-15 MED ORDER — SODIUM CHLORIDE 0.9 % IV SOLN
250.0000 mL | INTRAVENOUS | Status: DC | PRN
  Administered 2010-12-15: 250 mL via INTRAVENOUS

## 2010-12-15 MED ORDER — METFORMIN HCL 500 MG PO TABS
500.0000 mg | ORAL_TABLET | Freq: Two times a day (BID) | ORAL | Status: DC
  Administered 2010-12-16 – 2010-12-17 (×3): 500 mg via ORAL
  Filled 2010-12-15 (×5): qty 1

## 2010-12-15 MED ORDER — CLOPIDOGREL BISULFATE 75 MG PO TABS
75.0000 mg | ORAL_TABLET | Freq: Every day | ORAL | Status: DC
  Administered 2010-12-16 – 2010-12-17 (×2): 75 mg via ORAL
  Filled 2010-12-15 (×3): qty 1

## 2010-12-15 MED ORDER — FENOFIBRATE 160 MG PO TABS
160.0000 mg | ORAL_TABLET | Freq: Every day | ORAL | Status: DC
  Administered 2010-12-16 – 2010-12-17 (×2): 160 mg via ORAL
  Filled 2010-12-15 (×2): qty 1

## 2010-12-15 MED ORDER — ENOXAPARIN SODIUM 40 MG/0.4ML ~~LOC~~ SOLN
40.0000 mg | SUBCUTANEOUS | Status: DC
  Administered 2010-12-16 – 2010-12-17 (×2): 40 mg via SUBCUTANEOUS
  Filled 2010-12-15 (×3): qty 0.4

## 2010-12-15 MED ORDER — ISOSORBIDE MONONITRATE ER 30 MG PO TB24
30.0000 mg | ORAL_TABLET | Freq: Every day | ORAL | Status: DC
  Administered 2010-12-16 – 2010-12-17 (×2): 30 mg via ORAL
  Filled 2010-12-15 (×2): qty 1

## 2010-12-15 MED ORDER — ROSUVASTATIN CALCIUM 5 MG PO TABS
5.0000 mg | ORAL_TABLET | Freq: Every day | ORAL | Status: DC
  Administered 2010-12-16: 5 mg via ORAL
  Filled 2010-12-15 (×2): qty 1

## 2010-12-15 MED ORDER — NITROGLYCERIN 0.4 MG SL SUBL: 0.4000 mg | SUBLINGUAL_TABLET | SUBLINGUAL | Status: DC | PRN

## 2010-12-15 MED ORDER — ASPIRIN 81 MG PO CHEW: 324.0000 mg | CHEWABLE_TABLET | ORAL | Status: AC

## 2010-12-15 MED ORDER — ASPIRIN 300 MG RE SUPP
300.0000 mg | RECTAL | Status: AC
  Filled 2010-12-15: qty 1

## 2010-12-15 MED ORDER — SODIUM CHLORIDE 0.9 % IV SOLN: 250.0000 mL | INTRAVENOUS | Status: DC | PRN

## 2010-12-15 MED ORDER — SODIUM CHLORIDE 0.9 % IJ SOLN
3.0000 mL | Freq: Two times a day (BID) | INTRAMUSCULAR | Status: DC
  Administered 2010-12-16 – 2010-12-17 (×4): 3 mL via INTRAVENOUS

## 2010-12-15 MED ORDER — ONDANSETRON HCL 4 MG/2ML IJ SOLN: 4.0000 mg | Freq: Four times a day (QID) | INTRAMUSCULAR | Status: DC | PRN

## 2010-12-15 MED ORDER — SODIUM CHLORIDE 0.9 % IJ SOLN: 3.0000 mL | INTRAMUSCULAR | Status: DC | PRN

## 2010-12-15 MED ORDER — SODIUM CHLORIDE 0.9 % IJ SOLN
3.0000 mL | Freq: Two times a day (BID) | INTRAMUSCULAR | Status: DC
  Administered 2010-12-15: 3 mL via INTRAVENOUS

## 2010-12-15 MED ORDER — ASPIRIN 81 MG PO CHEW
324.0000 mg | CHEWABLE_TABLET | Freq: Once | ORAL | Status: AC
  Administered 2010-12-15: 243 mg via ORAL
  Filled 2010-12-15: qty 4

## 2010-12-15 MED ORDER — SODIUM CHLORIDE 0.9 % IJ SOLN
3.0000 mL | INTRAMUSCULAR | Status: DC | PRN
  Administered 2010-12-16 (×2): 3 mL via INTRAVENOUS

## 2010-12-15 MED ORDER — ASPIRIN EC 81 MG PO TBEC
81.0000 mg | DELAYED_RELEASE_TABLET | Freq: Every day | ORAL | Status: DC
  Administered 2010-12-17: 81 mg via ORAL
  Filled 2010-12-15: qty 1

## 2010-12-15 MED ORDER — NITROGLYCERIN 0.4 MG SL SUBL
0.4000 mg | SUBLINGUAL_TABLET | SUBLINGUAL | Status: DC | PRN
Start: 2010-12-15 — End: 2010-12-17

## 2010-12-15 NOTE — ED Notes (Signed)
Cardiology in with pt.

## 2010-12-15 NOTE — ED Provider Notes (Addendum)
History     CSN: [id number] Arrival date & time: 12/15/2010  2:22 PM   First MD Initiated Contact with Patient 12/15/10 1501      Chief Complaint  Patient presents with  . Chest Pain    (Consider location/radiation/quality/duration/timing/severity/associated sxs/prior treatment) Patient is a 60 y.o. male presenting with chest pain. The history is provided by the patient and the spouse.  Chest Pain The chest pain began 2 days ago. Episode Length: Unknown. Chest pain occurs intermittently. The chest pain is resolved. Associated with: Nothing. At its most intense, the pain is at 5/10. The pain is currently at 0/10. The severity of the pain is moderate. The quality of the pain is described as burning and dull. The pain does not radiate. Exacerbated by: Nothing. Primary symptoms include fatigue, nausea and vomiting. Pertinent negatives for primary symptoms include no fever, no syncope, no shortness of breath, no cough, no wheezing, no palpitations, no abdominal pain and no altered mental status.  The fatigue began today. The fatigue has been resolved since its onset.  Nausea began today. Associated with: Nothing. Exacerbated by:  nothing.  Associated symptoms include near-syncope.  Pertinent negatives for associated symptoms include no claudication, no diaphoresis, no lower extremity edema, no numbness, no orthopnea, no paroxysmal nocturnal dyspnea and no weakness. He tried nitroglycerin for the symptoms. Risk factors include male gender.  His past medical history is significant for CAD, diabetes, hyperlipidemia and hypertension.  Pertinent negatives for past medical history include no CHF.  Procedure history is positive for cardiac catheterization.     Past Medical History  Diagnosis Date  . Coronary artery disease   . Diabetes mellitus   . Hyperlipidemia   . Hypertension   . Degenerative joint disease     Past Surgical History  Procedure Date  . Right knee replacement  in 2005     . Post varicose surgery-1995   . Left rotator cuff repair   . Coronary angioplasty with stent placement     History reviewed. No pertinent family history.  History  Substance Use Topics  . Smoking status: Never Smoker   . Smokeless tobacco: Not on file  . Alcohol Use: No      Review of Systems  Constitutional: Positive for fatigue. Negative for fever, chills, diaphoresis, activity change and appetite change.  HENT: Negative.   Eyes: Negative.   Respiratory: Negative for cough, shortness of breath and wheezing.   Cardiovascular: Positive for chest pain and near-syncope. Negative for palpitations, orthopnea, claudication, leg swelling and syncope.  Gastrointestinal: Positive for nausea and vomiting. Negative for abdominal pain.  Musculoskeletal: Negative.   Skin: Positive for pallor.  Neurological: Negative for weakness and numbness.  Hematological: Negative.   Psychiatric/Behavioral: Negative.  Negative for altered mental status.   the patient is a 60 year old male with a history of coronary artery disease, diabetes mellitus, hyperlipidemia, hypertension and a previous coronary stent as well as known coronary lesions that were not amenable to stents. He presents from his primary care physician's office, Dr. Collins Scotland where he was evaluated earlier this afternoon after having episodes of lightheadedness, nausea, vomiting, and pallor while at work this morning. He denies any chest pain or shortness of breath at the time of symptoms today, however reports that he had been having some burning and dull chest discomfort just right of the sternum over the last couple days. Dr. Collins Scotland sent the patient into the emergency department for further evaluation and for consultation with [facility], the patient's cardiology  group. The patient denies any diaphoresis. He appears in no acute distress and has no chest pain or symptoms at present. Of note, the patient was also found to be bradycardic with  a heart rate in the 40s at his primary care physician's office per the patient. At this time his heart rate is between 55 and 60 beats per minute with a stable blood pressure.  Allergies  Atorvastatin; Lisinopril; and Statins  Home Medications   Current Outpatient Rx  Name Route Sig Dispense Refill  . ASPIRIN 81 MG PO TABS Oral Take 81 mg by mouth daily.      Marland Kitchen CLOPIDOGREL BISULFATE 75 MG PO TABS Oral Take 75 mg by mouth daily.      . FENOFIBRATE 145 MG PO TABS Oral Take 145 mg by mouth daily.      . ISOSORBIDE MONONITRATE ER 30 MG PO TB24 Oral Take 1 tablet (30 mg total) by mouth daily. 30 tablet 11  . METFORMIN HCL 500 MG PO TABS Oral Take by mouth. 3 tablets in the morning, 2 tablets in the evening     . METOPROLOL TARTRATE 50 MG PO TABS Oral Take 50 mg by mouth daily.      Marland Kitchen NITROGLYCERIN 0.4 MG SL SUBL Sublingual Place 0.4 mg under the tongue every 5 (five) minutes as needed.        BP 148/74  Pulse 96  Temp(Src) 97.1 F (36.2 C) (Oral)  Resp 18  SpO2 98%  Physical Exam  Nursing note and vitals reviewed. Constitutional: He is oriented to person, place, and time. He appears well-developed and well-nourished. No distress.  HENT:  Head: Normocephalic and atraumatic.  Mouth/Throat: Oropharynx is clear and moist.  Eyes: EOM are normal. Pupils are equal, round, and reactive to light.  Neck: Normal range of motion. Neck supple. No JVD present. No tracheal deviation present.  Cardiovascular: Normal rate, regular rhythm, S1 normal, S2 normal, normal heart sounds and intact distal pulses.   No extrasystoles are present. PMI is not displaced.  Exam reveals no gallop and no friction rub.   No murmur heard. Pulmonary/Chest: Effort normal and breath sounds normal. No accessory muscle usage or stridor. Not tachypneic. No respiratory distress. He has no decreased breath sounds. He has no wheezes. He has no rhonchi. He has no rales. He exhibits no tenderness, no bony tenderness, no crepitus  and no retraction.  Abdominal: Soft. Bowel sounds are normal. He exhibits no distension and no mass. There is no tenderness. There is no rebound and no guarding.  Musculoskeletal: Normal range of motion. He exhibits no edema and no tenderness.  Neurological: He is alert and oriented to person, place, and time. No cranial nerve deficit. He exhibits normal muscle tone.  Skin: Skin is warm and dry. No rash noted. He is not diaphoretic. No erythema. No pallor.  Psychiatric: He has a normal mood and affect. His behavior is normal. Judgment and thought content normal.    ED Course  Procedures (including critical care time)   Labs Reviewed  PRO B NATRIURETIC PEPTIDE  CBC  CARDIAC PANEL(CRET KIN+CKTOT+MB+TROPI)  COMPREHENSIVE METABOLIC PANEL  PROTIME-INR  APTT   No results found.   No diagnosis found.   Date: 12/15/2010  Rate: 52  Rhythm: sinus bradycardia  QRS Axis: normal  Intervals: normal  ST/T Wave abnormalities: normal  Conduction Disutrbances:none  Narrative Interpretation: Sinus bradycardia, otherwise non-provocative EKG  Old EKG Reviewed: unchanged    MDM  ACS, MI, CAD, Musculoskeletal chest pain,  costochondritis, GERD, Gastrointestinal Chest Pain, Pleuritic Chest Pain, Pneumonia, Pneumothorax, Pulmonary Embolism, Esophageal Spasm, Arrhythmia considered among other potential etiologies in the patient's differential diagnosis.  [facility] consulted to evaluate the patient.       Felisa Bonier, MD 12/15/10 1610  Felisa Bonier, MD 12/15/10 [phone]

## 2010-12-15 NOTE — ED Notes (Signed)
Pt reports mid and right side chest pressure that started this am, having n/v with it. Denies any sob. ekg being done at triage. resp e/u.

## 2010-12-15 NOTE — H&P (Signed)
[facility] Hospital Admission Note Date: 12/15/2010  Patient name: Chad Yu Medical record number: [id number] Date of birth: 06/18/1950 Age: 60 y.o. Gender: male PCP: Herb Grays, MD, MD  Chief Complaint: CP, Bradycardia  History of Present Illness: Patient is a 60 year old male with a PMH significant for HTN, HLD, DM2 and known CAD with last cath performed in August 2012 that showed proximal D1 stenosis (99%) just above prior DES which was placed in 2009, an intervention was unsuccessful to stent the lesion and patient has been medically managed since. Today he presents to [facility] emergency room after symptoms of CP which has been ongoing since Sunday 11/25. 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 This morning the patient went to his PCP for a follow up of this chest pain, before which he felt nauseated and vomited x1, also during the office visit he was noted to be bradycardic with HR in 40's, this prompted his PCP to advise him to going to the ED for further evaluation. Currently patient denies CP, SOB, N/V, fever or chills. Reports compliance with all of his medications and denies any new diet or medication changes.  Meds: Medications Prior to Admission  Medication Dose Route Frequency Provider Last Rate Last Dose  . sodium chloride 0.9 % injection 3 mL  3 mL Intravenous Q12H Felisa Bonier, MD       And  . sodium chloride 0.9 % injection 3 mL  3 mL Intravenous PRN Felisa Bonier, MD       And  . 0.9 %  sodium chloride infusion  250 mL Intravenous PRN Felisa Bonier, MD 20 mL/hr at 12/15/10 1616 250 mL at 12/15/10 1616  . aspirin chewable tablet 324 mg  324 mg Oral Once Felisa Bonier, MD   243 mg at 12/15/10 1618    Medications Prior to Admission  Medication Sig Dispense Refill  . aspirin 81 MG tablet Take 81 mg by mouth daily.        . clopidogrel (PLAVIX) 75 MG tablet Take 75 mg by mouth daily.        . fenofibrate (TRICOR) 145 MG tablet Take 145 mg by mouth daily.        . isosorbide mononitrate (IMDUR) 30 MG 24 hr tablet Take 1 tablet (30 mg total) by mouth daily.  30 tablet  11  . metFORMIN (GLUCOPHAGE) 500 MG tablet Take by mouth. 3 tablets in the morning, 2 tablets in the evening       . metoprolol (LOPRESSOR) 50 MG tablet Take 50 mg by mouth daily.        . nitroGLYCERIN (NITROSTAT) 0.4 MG SL tablet Place 0.4 mg under the tongue every 5 (five) minutes as needed.          Allergies: Atorvastatin; Lisinopril; and Statins Past Medical History  Diagnosis Date  . Coronary artery disease   . Diabetes mellitus   . Hyperlipidemia   . Hypertension   . Degenerative joint disease    Past Surgical History  Procedure Date  . Right knee replacement  in 2005   . Post varicose surgery-1995   . Left rotator cuff repair   . Coronary angioplasty with stent placement  History reviewed. No pertinent family history. History   Social History  . Marital Status: Married    Spouse Name: N/A    Number of Children: N/A  . Years of Education: N/A   Occupational History  . Not on file.   Social History Main Topics  . Smoking status: Never Smoker   . Smokeless tobacco: Not on file  . Alcohol Use: No  . Drug Use: Not on file  . Sexually Active: Not on file   Other Topics Concern  . Not on file   Social History Narrative   The patient lives in [place] with his wife. He works in [employer]. He denies tobacco or alcohol abuse . He denies regular exercise.    Review of Systems: Negative except per HPI  Physical Exam: Blood pressure 148/74, pulse 96, temperature 97.1 F (36.2 C), temperature source Oral, resp. rate 18, SpO2 98.00%. General:  alert, well-developed, and  cooperative to examination.   Head:  normocephalic and atraumatic.   Eyes:  vision grossly intact, pupils equal, pupils round, pupils reactive to light, no injection and anicteric.   Mouth:  pharynx pink and moist, no erythema, and no exudates.   Neck:  supple, full ROM, no thyromegaly, no JVD, and no carotid bruits.   Lungs:  normal respiratory effort, no accessory muscle use, normal breath sounds, no crackles, and no wheezes. Heart:  slow rate, regular rhythm, no murmur, no gallop, and no rub.   Abdomen:  soft, non-tender, normal bowel sounds, no distention, no guarding, no rebound tenderness, no hepatomegaly, and no splenomegaly.   Msk:  no joint swelling, no joint warmth, and no redness over joints.   Pulses:  2+ DP/PT pulses bilaterally Extremities:  No cyanosis, clubbing, edema Neurologic:  alert & oriented X3, cranial nerves II-XII intact, strength normal in all extremities, sensation intact to light touch, and gait normal.   Skin:  turgor normal and no rashes.   Psych:  Oriented X3, memory intact for recent and remote, normally interactive, good eye contact, not anxious appearing, and not depressed appearing.   CBC:  Basename 12/15/10 1544  WBC 7.4  NEUTROABS --  HGB 14.2  HCT 40.7  MCV 87.3  PLT 189   Cardiac Enzymes: Pending  Imaging results:  Dg Chest 2 View  12/15/2010  *RADIOLOGY REPORT*  Clinical Data: Chest pain, history hypertension, coronary artery disease post stenting, diabetes, hyperlipidemia  CHEST - 2 VIEW  Comparison: 08/25/2010  Findings: Normal heart size, mediastinal contours, and pulmonary vascularity. Bronchitic changes without infiltrate or effusion. No pneumothorax. Scattered end plate spur formation thoracic spine.  IMPRESSION: Bronchitic changes.  Original Report Authenticated By: Lollie Marrow, M.D.    Other results: EKG: NSR with rate of 52, no ischemic changes noted. No change from prior EKG.   Assessment & Plan by Problem: Chest Pain: Patient  presents with chest pain at rest since sunday, which has been ongoing since Sunday 11/25, Patient has known CAD with last catheterization done on 08/2010 which shows patent prior stent placement from 2009 in D1, with 99% proximal D1 lesion, an intervention was unsuccessful to stent the lesion and patient has been medically managed since. EKG today showed: HR 52 but no signs of acute MI, no q waves detected.  Differentials include Angina vs ACS vs GERD vs Anxiety, less likely PNA, PTX or MSK pain given normal physical exam and normal x-ray findings. Given the patient's history and presentation, patient most likely is having an episode of  unstable angina. Plan: -Will admit to TELE, and obtain 12 lead EKG in AM. -Cycle CE x3 if Positive, consider anticoagulation vs catheterisation. -Will continue home, ASA, Plavix, Imdur, nitroglycerine, and trial of low dose Crestor -Given current bradycardia will reduce home metoprolol to 25mg  BID.      SignedDarnelle Maffucci 12/15/2010, 4:33 PM  Patient seen and examined. I agree with the assessment and plan as detailed above. See also my additional thoughts below.   The patient has been seen and examined as noted. I had a careful discussion with him and his wife. He does very active work. He has not had any exertional chest discomfort recently. Over the past few days he has had some chest and arm discomfort at rest. He has not done any heavy work since this started. Today he felt some mild dizziness and he had nausea and vomiting with some upper arm discomfort. It was noted that his heart rate was slow.(We do not have a lot of recordings of his heart rate since his beta blocker dose was increased earlier this year)At this point it is not absolutely clear that his recent symptoms represent ischemia. It is known that he has a tight lesion at the beginning of his diagonal before his stent. It has been felt that this area is not optimal for intervention.  I feel it is  most prudent to admit him and watch him overnight. We have cut his beta blocker dose back. It is not clear if his symptoms of nausea and vomiting are related to ischemia. Watching him on the monitor overnight will be helpful to see if he has any marked bradycardia or other arrhythmias. He will also give [place] an opportunity to watch his cardiac enzymes and see if there is any significant change. I have arranged for Dr.Stuckey to be his rounding doctor tomorrow. Dr. Riley Kill performed the last catheterization and was involved in the decision to watch him medically. After that catheterization the patient had a limited treadmill done on medications. The treadmill showed that  he had some EKG changes but no chest pain. If the patient has any enzyme elevation, repeat catheterization should be done. If there is no enzyme elevation the decision about how to proceed will be more difficult. Repeat catheterization could be considered. I think it also would be reasonable to consider following him as an outpatient on a lower dose of beta blocker.  Willa Rough, MD, [facility] 12/15/2010 5:54 PM

## 2010-12-15 NOTE — ED Notes (Signed)
Pt states that for the last 2 nights about 1730-1800 he has had stabbing chest pain on rt side of chest  Took nitro 2 nights ago and it eased ,this am vomited and got lightheaded, went to see dr Collins Scotland and his sugar was 280 and she told him his heart rate was 48 so he was sent here for further tests

## 2010-12-15 NOTE — ED Notes (Signed)
Gave old and new ECG to Dr. Karma Ganja and to Dr. Bebe Shaggy. 2:40 pm JG.

## 2010-12-16 ENCOUNTER — Other Ambulatory Visit: Payer: Self-pay

## 2010-12-16 DIAGNOSIS — R001 Bradycardia, unspecified: Secondary | ICD-10-CM | POA: Diagnosis present

## 2010-12-16 LAB — GLUCOSE, CAPILLARY
Glucose-Capillary: 189 mg/dL — ABNORMAL HIGH (ref 70–99)
Glucose-Capillary: 236 mg/dL — ABNORMAL HIGH (ref 70–99)

## 2010-12-16 LAB — LIPID PANEL
Cholesterol: 193 mg/dL (ref 0–200)
HDL: 34 mg/dL — ABNORMAL LOW (ref >39)
Triglycerides: 323 mg/dL — ABNORMAL HIGH (ref <150)

## 2010-12-16 LAB — COMPREHENSIVE METABOLIC PANEL
Alkaline Phosphatase: 46 U/L (ref 39–117)
BUN: 24 mg/dL — ABNORMAL HIGH (ref 6–23)
CO2: 27 mEq/L (ref 19–32)
Chloride: 98 mEq/L (ref 96–112)
GFR calc Af Amer: 82 mL/min — ABNORMAL LOW (ref >90)
GFR calc non Af Amer: 70 mL/min — ABNORMAL LOW (ref >90)
Glucose, Bld: 294 mg/dL — ABNORMAL HIGH (ref 70–99)
Potassium: 3.9 mEq/L (ref 3.5–5.1)
Total Bilirubin: 0.2 mg/dL — ABNORMAL LOW (ref 0.3–1.2)
Total Protein: 6.5 g/dL (ref 6.0–8.3)

## 2010-12-16 LAB — CBC
MCH: 29.8 pg (ref 26.0–34.0)
MCV: 88 fL (ref 78.0–100.0)
Platelets: 162 10*3/uL (ref 150–400)
Platelets: 165 10*3/uL (ref 150–400)
RBC: 4.4 MIL/uL (ref 4.22–5.81)
RDW: 12.9 % (ref 11.5–15.5)
WBC: 5.9 10*3/uL (ref 4.0–10.5)

## 2010-12-16 LAB — BASIC METABOLIC PANEL
CO2: 27 mEq/L (ref 19–32)
Calcium: 9.1 mg/dL (ref 8.4–10.5)
Creatinine, Ser: 0.97 mg/dL (ref 0.50–1.35)
Glucose, Bld: 182 mg/dL — ABNORMAL HIGH (ref 70–99)
Sodium: 138 mEq/L (ref 135–145)

## 2010-12-16 LAB — CARDIAC PANEL(CRET KIN+CKTOT+MB+TROPI)
CK, MB: 2.9 ng/mL (ref 0.3–4.0)
CK, MB: 3 ng/mL (ref 0.3–4.0)
Total CK: 105 U/L (ref 7–232)
Troponin I: <0.3 ng/mL (ref <0.30)

## 2010-12-16 MED ORDER — ASPIRIN 81 MG PO CHEW: 81.0000 mg | CHEWABLE_TABLET | Freq: Every day | ORAL | Status: DC

## 2010-12-16 NOTE — Progress Notes (Signed)
Inpatient Diabetes Program Recommendations  AACE/ADA: New Consensus Statement on Inpatient Glycemic Control (2009)  Target Ranges:  Prepandial:   less than 140 mg/dL      Peak postprandial:   less than 180 mg/dL (1-2 hours)      Critically ill patients:  140 - 180 mg/dL   Reason for Visit: CBGs  12/15/10  196-294 mg/dl    14/78/29  [id number]$ /dl  Inpatient Diabetes Program Recommendations Correction (SSI): Start Novolog SENSITIVE correction scale TID and HS if CBGs continue greater than 180 mg/dl  Note:

## 2010-12-16 NOTE — Progress Notes (Signed)
Subjective:  History carefully reviewed.  He downplays any chest pain.  Feels good overall.  Denies progressive symptoms this am.  He said he thought he was coming down with the flu at the time.    Objective:  Vital Signs in the last 24 hours: Temp:  [97.1 F (36.2 C)-98.5 F (36.9 C)] 97.7 F (36.5 C) (11/29 0459) Pulse Rate:  [49-96] 59  (11/29 0459) Resp:  [18-20] 20  (11/29 0459) BP: (119-152)/(71-95) 136/88 mmHg (11/29 0459) SpO2:  [95 %-98 %] 98 % (11/29 0459) Weight:  [105.235 kg (232 lb)-105.3 kg (232 lb 2.3 oz)] 232 lb (105.235 kg) (11/28 2253)  Intake/Output from previous day: 11/28 0701 - 11/29 0700 In: 6 [I.V.:6] Out: -    Physical Exam: General: Well developed, well nourished, in no acute distress. Head:  Normocephalic and atraumatic. Lungs: Clear to auscultation and percussion. Heart: Normal S1 and S2.  No murmur, rubs or gallops.  Pulses: Pulses normal in all 4 extremities. Extremities: No clubbing or cyanosis. No edema. Neurologic: Alert and oriented x 3.    Lab Results:  Basename 12/16/10 0540 12/16/10 0007  WBC 5.6 5.9  HGB 13.1 12.9*  PLT 162 165    Basename 12/16/10 0540 12/16/10 0007  NA 138 135  K 3.8 3.9  CL 100 98  CO2 27 27  GLUCOSE 182* 294*  BUN 23 24*  CREATININE 0.97 1.11    Basename 12/16/10 0540 12/16/10 0006  TROPONINI <0.30 <0.30   Hepatic Function Panel  Basename 12/16/10 0007  PROT 6.5  ALBUMIN 3.6  AST 21  ALT 24  ALKPHOS 46  BILITOT 0.2*  BILIDIR --  IBILI --    Basename 12/16/10 0540  CHOL 193   No results found for this basename: PROTIME in the last 72 hours  Imaging: Dg Chest 2 View  12/15/2010  *RADIOLOGY REPORT*  Clinical Data: Chest pain, history hypertension, coronary artery disease post stenting, diabetes, hyperlipidemia  CHEST - 2 VIEW  Comparison: 08/25/2010  Findings: Normal heart size, mediastinal contours, and pulmonary vascularity. Bronchitic changes without infiltrate or effusion. No  pneumothorax. Scattered end plate spur formation thoracic spine.  IMPRESSION: Bronchitic changes.  Original Report Authenticated By: Lollie Marrow, M.D.    EKG:  Normal.  No ST changes.  Cardiac Studies:  Cardiac enzymes negative times three  Assessment/Plan:  Patient Active Hospital Problem List: CHEST PAIN (04/27/2009)   Assessment: He has known diagonal disease with a tight lesion prox to his stent.  However, he has been generally stable for a period of time.     Plan: Enzymes neg and ECG normal.  Will ambulate today and see how he does.  If CP continues, then restudy may be necessary.  Would do from femoral for better back up if an attempt is made.  We were across before, but crossing was difficult, and we could not get a balloon across.  We were working from the femoral approach at that time.    Bradycardia (12/16/2010)   Assessment: Rate is up today   Plan: B blocker was reduced.  Will monitor and see how he does.  He might also have gotten a bit vagal with his nausea.  He feels better today.        Shawnie Pons, MD, [facility], FSCAI 12/16/2010, 8:45 AM

## 2010-12-17 MED ORDER — METOPROLOL SUCCINATE ER 25 MG PO TB24: 25.0000 mg | ORAL_TABLET | Freq: Every day | ORAL | Status: DC

## 2010-12-17 MED ORDER — ROSUVASTATIN CALCIUM 5 MG PO TABS: 5.0000 mg | ORAL_TABLET | Freq: Every day | ORAL | Status: DC

## 2010-12-17 NOTE — Discharge Summary (Signed)
Patient ID: Chad Yu,  MRN: [id number], DOB/AGE: 1950-08-02 60 y.o.  Admit date: 12/15/2010 Discharge date: 12/17/2010  Primary MD: Herb Grays, MD Primary Cardiologist: Valera Castle MD  Discharge Diagnoses: Principal Problem:  *CHEST PAIN Active Problems: CAD, NATIVE VESSEL s/p DES to D1 in 2009 on Plavix  Bradycardia   DM II - A1C  8.1 (12/16/10)  HYPERLIPIDEMIA - LDL 94 (12/16/10)  HYPERTENSION   Allergies Allergies  Allergen Reactions  . Atorvastatin     REACTION: myalgies  . Lisinopril Other (See Comments)    Hair ball in throat   Procedures: None  History of Present Illness: 59yom w/ PMHx significant for HTN, HLD, DMII, and CAD (s/p DES to D1 in 2009) who presented to [facility] on 12/15/10 with intermittent chest pain since 12/12/10. His last cath in August 2012 showed proximal D1 stenosis (99%) just above prior D1 stent, however, an intervention was unsuccessful and the patient has been medically managed.  Hospital Course: In the ED he was chest pain free, his EKG was without acute changes, his initial Troponin was negative, and his CXR was without any acute cardiopulmonary findings. Due to his significant cardiac history and risk factors, he was admitted for further evaluation.  He was found to be bradycardic with HR in the 40s, therefore his Metoprolol tartrate was decreased from 50mg  to 25mg  daily. On discharge his Metoprolol tartrate was changed to Metoprolol Succinate (XL). He was continued on his ASA, Plavix, Tricor, and Imdur. His LDL was found to be 94 and was initiated on low dose Crestor. His A1C was elevated at 8.1 and TSH was normal.   He remained chest pain free without any objective evidence of ischemia on EKG and his cardiac enzymes remained negative throughout the his hospitalization. On day of discharge he was feeling well and able to ambulate without symptoms. He was instructed not to return to work tomorrow and not to drive until evaluated by  Dr. Daleen Squibb next week. He should follow up with his PCP for his elevated A1C and to follow up lipids and LFTs.  Discharge Vitals:  Temp:  [96.7 F (35.9 C)-98.9 F (37.2 C)] 96.7 F (35.9 C) (11/30 0532) Pulse Rate:  [63-84] 64  (11/30 0532) Resp:  [16-18] 16  (11/30 0532) BP: (122-136)/(82-93) 122/82 mmHg (11/30 0532) SpO2:  [93 %-98 %] 95 % (11/30 0532)  Labs:  Lab Results  Component Value Date   WBC 5.6 12/16/2010   HGB 13.1 12/16/2010   HCT 38.7* 12/16/2010   MCV 88.0 12/16/2010   PLT 162 12/16/2010     Lab 12/16/10 0540 12/16/10 0007  NA 138 --  K 3.8 --  CL 100 --  CO2 27 --  BUN 23 --  CREATININE 0.97 --  CALCIUM 9.1 --  PROT -- 6.5  BILITOT -- 0.2*  ALKPHOS -- 46  ALT -- 24  AST -- 21  GLUCOSE 182* --    Basename 12/16/10 0540 12/16/10 0006 12/15/10 1543  CKTOTAL 105 105 109  CKMB 3.0 2.9 3.9  TROPONINI <0.30 <0.30 <0.30     Lab Results  Component Value Date   CHOL 193 12/16/2010    HDL 34* 12/16/2010    LDLCALC 94 12/16/2010    TRIG 323* 12/16/2010    CHOLHDL 5.7 12/16/2010    Basename 12/15/10 1544  LABPROT 13.8  INR 1.04    12/16/2010 00:07  Hemoglobin A1C 8.1 (H)    12/16/2010 00:07  TSH 1.808     Follow-up Information  Follow up with Valera Castle, MD on 12/21/2010. (4:00)    Contact information:   [facility] [address] [place] [address] [phone]       Follow up with Herb Grays, MD. Make an appointment in 2 weeks.   Contact information:   [address] [place] [address] [phone]          Discharge Medications: Discharge Medication List as of 12/17/2010  2:12 PM    START taking these medications   Details  metoprolol succinate (TOPROL XL) 25 MG 24 hr tablet Take 1 tablet (25 mg total) by mouth daily., Starting 12/17/2010, Until Sat 12/17/11, Print    rosuvastatin (CRESTOR) 5 MG tablet Take 1 tablet (5 mg total) by mouth at bedtime., Starting  12/17/2010, Until Sat 12/17/11, Print      CONTINUE these medications which have NOT CHANGED   Details  aspirin 81 MG tablet Take 81 mg by mouth daily.  , Until Discontinued, Historical Med    clopidogrel (PLAVIX) 75 MG tablet Take 75 mg by mouth daily.  , Until Discontinued, Historical Med    fenofibrate (TRICOR) 145 MG tablet Take 145 mg by mouth daily.  , Until Discontinued, Historical Med    isosorbide mononitrate (IMDUR) 30 MG 24 hr tablet Take 1 tablet (30 mg total) by mouth daily., Starting 09/22/2010, Until Discontinued, Normal    metFORMIN (GLUCOPHAGE) 500 MG tablet Take by mouth. 3 tablets in the morning, 2 tablets in the evening , Until Discontinued, Historical Med    nitroGLYCERIN (NITROSTAT) 0.4 MG SL tablet Place 0.4 mg under the tongue every 5 (five) minutes as needed.  , Until Discontinued, Historical Med      STOP taking these medications     metoprolol (LOPRESSOR) 50 MG tablet         Outstanding Labs/Studies: PT WILL REQUIRE F/U LIPIDS AND LFT'S IN 6-8 WKS AS WE INITIATED A NEW STATIN DURING THIS HOSPITALIZATION.  FOLLOW UP ELEVATED A1C (8.1)  Duration of Discharge Encounter: Greater than 30 minutes including physician time.  Signed, Selah Klang, PA-C 12/17/2010, 2:19 PM

## 2010-12-17 NOTE — Progress Notes (Signed)
Subjective:  He is doing well.  Entire history reviewed.  He ambulated yesterday all day with no chest pain.  His wife was with him most of the day, and confirms this.  He also loaded wood last week, no symptoms.  Now pain free.  Enzymes negative.  Objective:  Vital Signs in the last 24 hours: Temp:  [96.7 F (35.9 C)-98.9 F (37.2 C)] 96.7 F (35.9 C) (11/30 0532) Pulse Rate:  [63-84] 64  (11/30 0532) Resp:  [16-18] 16  (11/30 0532) BP: (122-136)/(72-93) 122/82 mmHg (11/30 0532) SpO2:  [93 %-98 %] 95 % (11/30 0532)  Intake/Output from previous day: 11/29 0701 - 11/30 0700 In: 1086 [P.O.:1080; I.V.:6] Out: -    Physical Exam: General: Well developed, well nourished, in no acute distress. Head:  Normocephalic and atraumatic. Lungs: Clear to auscultation and percussion. Heart: Normal S1 and S2.  No murmur, rubs or gallops.  Pulses: Pulses normal in all 4 extremities. Extremities: No clubbing or cyanosis. No edema. Neurologic: Alert and oriented x 3.    Lab Results:  Basename 12/16/10 0540 12/16/10 0007  WBC 5.6 5.9  HGB 13.1 12.9*  PLT 162 165    Basename 12/16/10 0540 12/16/10 0007  NA 138 135  K 3.8 3.9  CL 100 98  CO2 27 27  GLUCOSE 182* 294*  BUN 23 24*  CREATININE 0.97 1.11    Basename 12/16/10 0540 12/16/10 0006  TROPONINI <0.30 <0.30   Hepatic Function Panel  Basename 12/16/10 0007  PROT 6.5  ALBUMIN 3.6  AST 21  ALT 24  ALKPHOS 46  BILITOT 0.2*  BILIDIR --  IBILI --    Basename 12/16/10 0540  CHOL 193   No results found for this basename: PROTIME in the last 72 hours  Imaging: Dg Chest 2 View  12/15/2010  *RADIOLOGY REPORT*  Clinical Data: Chest pain, history hypertension, coronary artery disease post stenting, diabetes, hyperlipidemia  CHEST - 2 VIEW  Comparison: 08/25/2010  Findings: Normal heart size, mediastinal contours, and pulmonary vascularity. Bronchitic changes without infiltrate or effusion. No pneumothorax. Scattered end plate  spur formation thoracic spine.  IMPRESSION: Bronchitic changes.  Original Report Authenticated By: Lollie Marrow, M.D.        Assessment/Plan:  Patient Active Hospital Problem List: CHEST PAIN (04/27/2009)   Assessment: Completely resolved.  He was dizzy when he came in.  His enzymes are negative and he has been ambulating now with no chest pain at all.  ECG yesterday normal.  Enzymes all negative    Plan: Home with early follow up with Dr. Daleen Squibb. Bradycardia (12/16/2010)   Assessment: Heart rate currently stable, but he is on same dose as at home   Plan: Reduce metoprolol to metop XL 25mg  once daily.  No work tomorrow and no driving until seen by Dr. Daleen Squibb next week.  Please arrange follow up.    DC time less than 30 minutes.        Shawnie Pons, MD, [facility], FSCAI 12/17/2010, 11:38 AM

## 2010-12-17 NOTE — Progress Notes (Signed)
Utilization review completed. Kamylle Axelson, RN, BSN. 12/17/10  

## 2010-12-21 ENCOUNTER — Ambulatory Visit (INDEPENDENT_AMBULATORY_CARE_PROVIDER_SITE_OTHER): Payer: 59 | Admitting: Cardiology

## 2010-12-21 ENCOUNTER — Encounter: Payer: Self-pay | Admitting: Cardiology

## 2010-12-21 VITALS — BP 152/92 | HR 80 | Ht 72.0 in | Wt 242.0 lb

## 2010-12-21 DIAGNOSIS — I498 Other specified cardiac arrhythmias: Secondary | ICD-10-CM

## 2010-12-21 DIAGNOSIS — I2510 Atherosclerotic heart disease of native coronary artery without angina pectoris: Secondary | ICD-10-CM

## 2010-12-21 DIAGNOSIS — R42 Dizziness and giddiness: Secondary | ICD-10-CM

## 2010-12-21 DIAGNOSIS — R001 Bradycardia, unspecified: Secondary | ICD-10-CM

## 2010-12-21 NOTE — Assessment & Plan Note (Signed)
Resolved with decreasing his beta blocker. I think his dizzy spells are related to some volume depletion. I will have him take his metoprolol at night however.

## 2010-12-21 NOTE — Assessment & Plan Note (Signed)
Stable. No change in treatment except take metoprolol at night.

## 2010-12-21 NOTE — Progress Notes (Signed)
HPI Chad Yu returns today after being hospitalized for dizziness. He ruled out for myocardial infarction. His found to be bradycardic in the low 50s.His metoprolol was cut back by half. With ambulation his heart rate was in the 60s and his blood pressure was stable. He was discharged home.  Since discharge he continued to have some dizzy spells particularly after he takes his  medicines in the morning. He has been on isosorbide for a long time without problems.  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 He rarely drinks water. His blood sugars also been running high. I suspect yeast ball he completed.  He denies any chest discomfort palpitations presyncope or syncope.  His last catheterization showed a proximal diagonal stenosis just prior to his stent. Intervention was unsuccessful anything treated medically.  Past Medical History  Diagnosis Date  . Coronary artery disease   . Diabetes mellitus   . Hyperlipidemia   . Hypertension   . Degenerative joint disease     Current Outpatient Prescriptions  Medication Sig Dispense Refill  . aspirin 81 MG tablet Take 81 mg by mouth daily.        . clopidogrel (PLAVIX) 75 MG tablet Take 75 mg by mouth daily.        . fenofibrate (TRICOR) 145 MG tablet Take 145 mg by mouth daily.        . isosorbide mononitrate (IMDUR) 30 MG 24 hr tablet Take 1 tablet (30 mg total) by mouth daily.  30 tablet  11  . metFORMIN (GLUCOPHAGE) 500 MG tablet Take by mouth. 3 tablets in the morning, 2 tablets in the evening       . metoprolol succinate (TOPROL XL) 25 MG 24 hr tablet Take 1 tablet (25 mg total) by mouth daily.  30 tablet  6  . nitroGLYCERIN (NITROSTAT) 0.4 MG SL tablet Place 0.4 mg under the tongue every 5 (five) minutes as needed.          Allergies  Allergen Reactions  . Atorvastatin     REACTION: myalgies  . Lisinopril Other (See Comments)    Hair ball in throat  . Rosuvastatin Itching    No family history on  file.  History   Social History  . Marital Status: Married    Spouse Name: N/A    Number of Children: N/A  . Years of Education: N/A   Occupational History  . Not on file.   Social History Main Topics  . Smoking status: Never Smoker   . Smokeless tobacco: Not on file  . Alcohol Use: No  . Drug Use: Not on file  . Sexually Active: Not on file   Other Topics Concern  . Not on file   Social History Narrative   The patient lives in [place] with his wife. He works in [employer]. He denies tobacco or alcohol abuse . He denies regular exercise.    ROS ALL NEGATIVE EXCEPT THOSE NOTED IN HPI  PE  General Appearance: well developed, well nourished in no acute distress, obese, disheveled HEENT: symmetrical face, PERRLA, good dentition  Neck: no JVD, thyromegaly, or adenopathy, trachea midline Chest: symmetric without deformity Cardiac: PMI non-displaced, RRR, normal S1, S2, no gallop or murmur Lung: clear to ausculation and percussion Vascular: all pulses full without bruits  Abdominal: nondistended, nontender, good bowel sounds, no HSM, no bruits Extremities: no cyanosis, clubbing or edema, no sign of DVT, no varicosities  Skin: normal color, no rashes Neuro: alert  and oriented x 3, non-focal Pysch: normal affect  EKG  BMET    Component Value Date/Time   NA 138 12/16/2010 0540   K 3.8 12/16/2010 0540   CL 100 12/16/2010 0540   CO2 27 12/16/2010 0540   GLUCOSE 182* 12/16/2010 0540   BUN 23 12/16/2010 0540   CREATININE 0.97 12/16/2010 0540   CALCIUM 9.1 12/16/2010 0540   GFRNONAA 88* 12/16/2010 0540   GFRAA >90 12/16/2010 0540    Lipid Panel     Component Value Date/Time   CHOL 193 12/16/2010 0540   TRIG 323* 12/16/2010 0540   HDL 34* 12/16/2010 0540   CHOLHDL 5.7 12/16/2010 0540   VLDL 65* 12/16/2010 0540   LDLCALC 94 12/16/2010 0540    CBC    Component Value Date/Time   WBC 5.6 12/16/2010 0540   RBC 4.40 12/16/2010 0540   HGB 13.1  12/16/2010 0540   HCT 38.7* 12/16/2010 0540   PLT 162 12/16/2010 0540   MCV 88.0 12/16/2010 0540   MCH 29.8 12/16/2010 0540   MCHC 33.9 12/16/2010 0540   RDW 12.7 12/16/2010 0540   LYMPHSABS 1.9 08/25/2010 1608   MONOABS 0.7 08/25/2010 1608   EOSABS 0.2 08/25/2010 1608   BASOSABS 0.0 08/25/2010 1608

## 2010-12-21 NOTE — Patient Instructions (Signed)
Your doctor recommends that you drink 2-3 glasses of water in the morning. And that you alternate water with tea throughout the day.  Your doctor also recommends that you take your metoprolol at bedtime.  Your physician recommends that you continue on your current medications as directed. Please refer to the Current Medication list given to you today.  Your physician recommends that you schedule a follow-up appointment in: 3 months with Dr. Daleen Squibb. These are the dates available:  3/6, 3/8, 3/14, 3/20, and 3/26.  Please call to schedule your appt for one of those dates. :)

## 2010-12-21 NOTE — Assessment & Plan Note (Signed)
I think he is volume depleted from excess caffeine and elevated blood sugar. I have asked him to drink several glasses of water in the morning before going to work. A basket alternate water in his teeth throughout the day. Blood sugar control is obviously very important.

## 2011-01-02 ENCOUNTER — Other Ambulatory Visit: Payer: Self-pay | Admitting: Cardiovascular Disease

## 2011-03-31 ENCOUNTER — Ambulatory Visit (INDEPENDENT_AMBULATORY_CARE_PROVIDER_SITE_OTHER): Payer: 59 | Admitting: Cardiology

## 2011-03-31 ENCOUNTER — Encounter: Payer: Self-pay | Admitting: Cardiology

## 2011-03-31 VITALS — BP 152/90 | HR 84 | Ht 72.0 in | Wt 241.0 lb

## 2011-03-31 DIAGNOSIS — E785 Hyperlipidemia, unspecified: Secondary | ICD-10-CM

## 2011-03-31 DIAGNOSIS — R079 Chest pain, unspecified: Secondary | ICD-10-CM

## 2011-03-31 DIAGNOSIS — R0602 Shortness of breath: Secondary | ICD-10-CM

## 2011-03-31 DIAGNOSIS — I2510 Atherosclerotic heart disease of native coronary artery without angina pectoris: Secondary | ICD-10-CM

## 2011-03-31 DIAGNOSIS — I10 Essential (primary) hypertension: Secondary | ICD-10-CM

## 2011-03-31 MED ORDER — ISOSORBIDE MONONITRATE ER 60 MG PO TB24: 60.0000 mg | ORAL_TABLET | Freq: Every day | ORAL | Status: DC

## 2011-03-31 MED ORDER — NITROGLYCERIN 0.4 MG SL SUBL: 0.4000 mg | SUBLINGUAL_TABLET | SUBLINGUAL | Status: DC | PRN

## 2011-03-31 NOTE — Assessment & Plan Note (Signed)
He is having exertional angina only with heavy duty work. I cannot increase his metoprolol because of bradycardia in the past. I will increase his isosorbide 60 mg p.o. Every morning. He'll continue to carry his nitroglycerin. We have reinforced not to overdo it and also how to use nitroglycerin for this discomfort is not relieved by rest. Will plan on seeing him back in 3 months.

## 2011-03-31 NOTE — Patient Instructions (Signed)
Your physician recommends that you schedule a follow-up appointment in: 3 months with Dr. Daleen Squibb.  Increase Imdur to 60mg  daily.  Keep Nitroglycerin with you.

## 2011-03-31 NOTE — Progress Notes (Signed)
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 He generally goes away in 5-10 minutes. He carries nitroglycerin but does not have to take it.  With normal activity he rarely has any angina.  He has a history a 99% diagonal stenosis it could not be intervened on. We've been treating him medically.  He is compliant with his medications but he forgot to take his metoprolol this morning.  Past Medical History  Diagnosis Date  . Coronary artery disease   . Diabetes mellitus   . Hyperlipidemia   . Hypertension   . Degenerative joint disease     Current Outpatient Prescriptions  Medication Sig Dispense Refill  . aspirin 81 MG tablet Take 81 mg by mouth daily.        . clopidogrel (PLAVIX) 75 MG tablet Take 75 mg by mouth daily.        . isosorbide mononitrate (IMDUR) 60 MG 24 hr tablet Take 1 tablet (60 mg total) by mouth daily.  30 tablet  3  . metFORMIN (GLUCOPHAGE) 500 MG tablet Take by mouth. 3 tablets in the morning, 2 tablets in the evening       . metoprolol succinate (TOPROL XL) 25 MG 24 hr tablet Take 1 tablet (25 mg total) by mouth daily.  30 tablet  6  . nitroGLYCERIN (NITROSTAT) 0.4 MG SL tablet Place 1 tablet (0.4 mg total) under the tongue every 5 (five) minutes as needed.  25 tablet  3  . fenofibrate (TRICOR) 145 MG tablet Take 145 mg by mouth daily.          Allergies  Allergen Reactions  . Atorvastatin     REACTION: myalgies  . Lisinopril Other (See Comments)    Hair ball in throat  . Rosuvastatin Itching    No family history on file.  History   Social History  . Marital Status: Married    Spouse Name: N/A    Number of Children: N/A  . Years of Education: N/A   Occupational History  . Not on file.   Social History Main Topics  . Smoking status: Never Smoker   . Smokeless tobacco: Not on file  . Alcohol Use: No  . Drug  Use: Not on file  . Sexually Active: Not on file   Other Topics Concern  . Not on file   Social History Narrative   The patient lives in [place] with his wife. He works in [employer]. He denies tobacco or alcohol abuse . He denies regular exercise.    ROS ALL NEGATIVE EXCEPT THOSE NOTED IN HPI  PE  General Appearance: well developed, well nourished in no acute distress, base HEENT: symmetrical face, PERRLA, good dentition  Neck: no JVD, thyromegaly, or adenopathy, trachea midline Chest: symmetric without deformity Cardiac: PMI non-displaced, RRR, normal S1, S2, no gallop or murmur Lung: clear to ausculation and percussion Vascular: all pulses full without bruits  Abdominal: nondistended, nontender, good bowel sounds, no HSM, no bruits Extremities: no cyanosis, clubbing or edema, no sign of DVT, no varicosities  Skin: normal color, no rashes Neuro: alert and oriented x 3, non-focal Pysch: normal affect  EKG Normal sinus rhythm, normal EKG BMET    Component Value Date/Time   NA 138 12/16/2010 0540   K 3.8 12/16/2010 0540   CL 100 12/16/2010 0540   CO2 27 12/16/2010 0540  GLUCOSE 182* 12/16/2010 0540   BUN 23 12/16/2010 0540   CREATININE 0.97 12/16/2010 0540   CALCIUM 9.1 12/16/2010 0540   GFRNONAA 88* 12/16/2010 0540   GFRAA >90 12/16/2010 0540    Lipid Panel     Component Value Date/Time   CHOL 193 12/16/2010 0540   TRIG 323* 12/16/2010 0540   HDL 34* 12/16/2010 0540   CHOLHDL 5.7 12/16/2010 0540   VLDL 65* 12/16/2010 0540   LDLCALC 94 12/16/2010 0540    CBC    Component Value Date/Time   WBC 5.6 12/16/2010 0540   RBC 4.40 12/16/2010 0540   HGB 13.1 12/16/2010 0540   HCT 38.7* 12/16/2010 0540   PLT 162 12/16/2010 0540   MCV 88.0 12/16/2010 0540   MCH 29.8 12/16/2010 0540   MCHC 33.9 12/16/2010 0540   RDW 12.7 12/16/2010 0540   LYMPHSABS 1.9 08/25/2010 1608   MONOABS 0.7 08/25/2010 1608   EOSABS 0.2 08/25/2010 1608   BASOSABS 0.0  08/25/2010 1608

## 2011-04-06 ENCOUNTER — Other Ambulatory Visit: Payer: Self-pay | Admitting: Cardiovascular Disease

## 2011-05-02 ENCOUNTER — Other Ambulatory Visit: Payer: Self-pay | Admitting: Cardiology

## 2011-06-24 ENCOUNTER — Encounter: Payer: Self-pay | Admitting: Cardiology

## 2011-06-24 ENCOUNTER — Ambulatory Visit (INDEPENDENT_AMBULATORY_CARE_PROVIDER_SITE_OTHER): Payer: 59 | Admitting: Cardiology

## 2011-06-24 VITALS — BP 128/80 | HR 76 | Ht 72.0 in | Wt 238.0 lb

## 2011-06-24 DIAGNOSIS — I2510 Atherosclerotic heart disease of native coronary artery without angina pectoris: Secondary | ICD-10-CM

## 2011-06-24 DIAGNOSIS — R079 Chest pain, unspecified: Secondary | ICD-10-CM

## 2011-06-24 DIAGNOSIS — E785 Hyperlipidemia, unspecified: Secondary | ICD-10-CM

## 2011-06-24 DIAGNOSIS — I10 Essential (primary) hypertension: Secondary | ICD-10-CM

## 2011-06-24 NOTE — Patient Instructions (Signed)
Your physician recommends that you continue on your current medications as directed. Please refer to the Current Medication list given to you today.  Your physician wants you to follow-up in: 1 year. You will receive a reminder letter in the mail two months in advance. If you don't receive a letter, please call our office to schedule the follow-up appointment.  

## 2011-06-24 NOTE — Assessment & Plan Note (Signed)
No recurrent chest pain or angina. Continue secondary preventative therapy. He carries nitroglycerin around his neck. We'll see him back in the office in a year.

## 2011-06-24 NOTE — Assessment & Plan Note (Signed)
Resolved

## 2011-06-24 NOTE — Progress Notes (Signed)
HPI Chad Yu times in today for evaluation and management of his coronary artery disease. Since this failed intervention of diagonal branch in August, he has had no further pain. He is on a good medical program and carries nitroglycerin with him at all times. He continues to work hard manually.  His weight seems to be down a bit.  His labs are checked by his primary care.  Past Medical History  Diagnosis Date  . Coronary artery disease   . Diabetes mellitus   . Hyperlipidemia   . Hypertension   . Degenerative joint disease     Current Outpatient Prescriptions  Medication Sig Dispense Refill  . aspirin 81 MG tablet Take 81 mg by mouth daily.        . clopidogrel (PLAVIX) 75 MG tablet TAKE 1 TABLET BY MOUTH EVERY DAY  30 tablet  6  . isosorbide mononitrate (IMDUR) 60 MG 24 hr tablet Take 1 tablet (60 mg total) by mouth daily.  30 tablet  3  . metFORMIN (GLUCOPHAGE) 500 MG tablet Take by mouth. 3 tablets in the morning, 2 tablets in the evening       . metoprolol succinate (TOPROL XL) 25 MG 24 hr tablet Take 1 tablet (25 mg total) by mouth daily.  30 tablet  6  . nitroGLYCERIN (NITROSTAT) 0.4 MG SL tablet Place 1 tablet (0.4 mg total) under the tongue every 5 (five) minutes as needed.  25 tablet  3  . DISCONTD: fenofibrate (TRICOR) 145 MG tablet Take 145 mg by mouth daily.          Allergies  Allergen Reactions  . Atorvastatin     REACTION: myalgies  . Lisinopril Other (See Comments)    Hair ball in throat  . Rosuvastatin Itching    No family history on file.  History   Social History  . Marital Status: Married    Spouse Name: N/A    Number of Children: N/A  . Years of Education: N/A   Occupational History  . Not on file.   Social History Main Topics  . Smoking status: Never Smoker   . Smokeless tobacco: Not on file  . Alcohol Use: No  . Drug Use: Not on file  . Sexually Active: Not on file   Other Topics Concern  . Not on file   Social History Narrative   The patient lives in [place] with his wife. He works in [employer]. He denies tobacco or alcohol abuse . He denies regular exercise.    ROS ALL NEGATIVE EXCEPT THOSE NOTED IN HPI  PE  General Appearance: well developed, well nourished in no acute distress, overweight HEENT: symmetrical face, PERRLA,   Neck: no JVD, thyromegaly, or adenopathy, trachea midline Chest: symmetric without deformity Cardiac: PMI non-displaced, RRR, normal S1, S2, no gallop or murmur Lung: clear to ausculation and percussion Vascular: all pulses full without bruits  Abdominal: nondistended, nontender, good bowel sounds, no HSM, no bruits Extremities: no cyanosis, clubbing or edema, no sign of DVT, no varicosities  Skin: normal color, no rashes Neuro: alert and oriented x 3, non-focal Pysch: normal affect  EKG  BMET    Component Value Date/Time   NA 138 12/16/2010 0540   K 3.8 12/16/2010 0540   CL 100 12/16/2010 0540   CO2 27 12/16/2010 0540   GLUCOSE 182* 12/16/2010 0540   BUN 23 12/16/2010 0540   CREATININE 0.97 12/16/2010 0540   CALCIUM 9.1 12/16/2010 0540   GFRNONAA 88* 12/16/2010  0540   GFRAA >90 12/16/2010 0540    Lipid Panel     Component Value Date/Time   CHOL 193 12/16/2010 0540   TRIG 323* 12/16/2010 0540   HDL 34* 12/16/2010 0540   CHOLHDL 5.7 12/16/2010 0540   VLDL 65* 12/16/2010 0540   LDLCALC 94 12/16/2010 0540    CBC    Component Value Date/Time   WBC 5.6 12/16/2010 0540   RBC 4.40 12/16/2010 0540   HGB 13.1 12/16/2010 0540   HCT 38.7* 12/16/2010 0540   PLT 162 12/16/2010 0540   MCV 88.0 12/16/2010 0540   MCH 29.8 12/16/2010 0540   MCHC 33.9 12/16/2010 0540   RDW 12.7 12/16/2010 0540   LYMPHSABS 1.9 08/25/2010 1608   MONOABS 0.7 08/25/2010 1608   EOSABS 0.2 08/25/2010 1608   BASOSABS 0.0 08/25/2010 1608

## 2011-07-11 ENCOUNTER — Ambulatory Visit: Payer: 59

## 2011-07-11 ENCOUNTER — Ambulatory Visit (INDEPENDENT_AMBULATORY_CARE_PROVIDER_SITE_OTHER): Payer: 59 | Admitting: Family Medicine

## 2011-07-11 VITALS — BP 148/86 | HR 80 | Temp 98.5°F | Resp 18 | Ht 70.25 in | Wt 233.0 lb

## 2011-07-11 DIAGNOSIS — M25539 Pain in unspecified wrist: Secondary | ICD-10-CM

## 2011-07-11 DIAGNOSIS — M25531 Pain in right wrist: Secondary | ICD-10-CM

## 2011-07-11 MED ORDER — MELOXICAM 15 MG PO TABS: 15.0000 mg | ORAL_TABLET | Freq: Every day | ORAL | Status: AC

## 2011-07-11 NOTE — Progress Notes (Signed)
This is a 61 year old man who was loading a 60 pound log onto his pickup truck today and experienced acute sharp pain in the right wrist, ulnar side. This happened approximately 2 hours prior to arrival. He's had pain continuously since then which is made worse by rotating his wrist or flexing or extending it.  Objective: Mild swelling right wrist, ulnar side near the styloid. He has decreased range of motion secondary to pain limited to 30 flexion and extension, 30 rotation (pronation and supination). There is no bony abnormality. Skin overlying the area is normal. There is no ecchymosis.  [facility] reading (PRIMARY) by  Dr. Milus Glazier:  Right wrist.  No fracture  Assessment: Wrist sprain, right hand  Plan: Splint x5 days and recheck, meloxicam

## 2011-07-18 ENCOUNTER — Ambulatory Visit (INDEPENDENT_AMBULATORY_CARE_PROVIDER_SITE_OTHER): Payer: 59 | Admitting: Family Medicine

## 2011-07-18 VITALS — BP 145/81 | HR 85 | Temp 98.7°F | Resp 17 | Ht 70.5 in | Wt 237.0 lb

## 2011-07-18 DIAGNOSIS — L03319 Cellulitis of trunk, unspecified: Secondary | ICD-10-CM

## 2011-07-18 DIAGNOSIS — L02219 Cutaneous abscess of trunk, unspecified: Secondary | ICD-10-CM

## 2011-07-18 DIAGNOSIS — L02419 Cutaneous abscess of limb, unspecified: Secondary | ICD-10-CM

## 2011-07-18 DIAGNOSIS — M12239 Villonodular synovitis (pigmented), unspecified wrist: Secondary | ICD-10-CM

## 2011-07-18 MED ORDER — SULFAMETHOXAZOLE-TRIMETHOPRIM 800-160 MG PO TABS: 1.0000 | ORAL_TABLET | Freq: Two times a day (BID) | ORAL | Status: AC

## 2011-07-18 NOTE — Patient Instructions (Signed)
Keep abscess clean and covered. Return if he gets in all worse.  Continue to wear the splint on the wrist as long as it is still having a pain.

## 2011-07-18 NOTE — Progress Notes (Signed)
Subjective: Patient is here for 2 things. He needs a recheck of his right wrist which is doing better, but it hurt at the lateral aspect of the distal ulna. He strained a tendon there, and it is been a little less than a week.  He has a possible MRSA abscess coming up on his left chest wall. He just felt a Sunday morning, so it has been there only about 24 hours. He's had a couple of such abscesses in the past.  Objective: Erythematous lesion left chest wall mid axillary line. He has some bruising around degrees squeeze it. It had a whitehead in the center of it. It is not draining right now. I attempted to open it with a 18-gauge needle, and only got blood but no more pus out of it. Did not feel like this needed to be further lanced so I just cultured the drainage that I got.  The wrist is still little bit tender though he has pretty good strength in that hand with grip.  Assessment: Abscess left chest wall, probably MRSA Tendinitis right wrist  Plan:  Continue wearing the splint as necessary until that is feeling much better. Bactrim DS twice a day all cultures pending. If the abscess gets a little worse he is to come back and we will lanced it. 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

## 2011-07-21 LAB — WOUND CULTURE: Gram Stain: NONE SEEN

## 2011-11-16 ENCOUNTER — Other Ambulatory Visit: Payer: Self-pay | Admitting: Cardiology

## 2012-09-05 ENCOUNTER — Other Ambulatory Visit: Payer: Self-pay | Admitting: Neurosurgery

## 2012-10-30 ENCOUNTER — Other Ambulatory Visit (HOSPITAL_COMMUNITY): Payer: 59

## 2012-10-31 ENCOUNTER — Encounter (HOSPITAL_COMMUNITY): Payer: Self-pay | Admitting: Pharmacy Technician

## 2012-11-07 NOTE — Pre-Procedure Instructions (Signed)
Abem Shaddix Sr.  11/07/2012   Your procedure is scheduled on:  Wednesday, November 14, 2012 at 10:35 AM.   Report to [facility] Entrance "A" at 8:30 AM.   Call this number if you have problems the morning of surgery: [phone]   Remember:   Do not eat food or drink liquids after midnight Tuesday, 11/13/12.   Take these medicines the morning of surgery with A SIP OF WATER: nitroGLYCERIN (NITROSTAT) -if needed.  Discontinue Aspirin, Coumadin, Plavix, Effient, Fish Oil and herbal medication 7 days prior to surgery.    Do not wear jewelry.  Do not wear lotions, powders, or cologne. You may wear deodorant.  Do not shave 48 hours prior to surgery. Men may shave face and neck.  Do not bring valuables to the hospital.  [facility] is not responsible                  for any belongings or valuables.               Contacts, dentures or bridgework may not be worn into surgery.  Leave suitcase in the car. After surgery it may be brought to your room.  For patients admitted to the hospital, discharge time is determined by your                treatment team.   Special Instructions: Shower using CHG 2 nights before surgery and the night before surgery.  If you shower the day of surgery use CHG.  Use special wash - you have one bottle of CHG for all showers.  You should use approximately 1/3 of the bottle for each shower.   Please read over the following fact sheets that you were given: Pain Booklet, Coughing and Deep Breathing, Blood Transfusion Information, MRSA Information and Surgical Site Infection Prevention

## 2012-11-08 ENCOUNTER — Encounter (HOSPITAL_COMMUNITY)
Admission: RE | Admit: 2012-11-08 | Discharge: 2012-11-08 | Disposition: A | Discharge status: Home / Self Care | Payer: 59 | Source: Ambulatory Visit | Attending: Neurosurgery | Admitting: Neurosurgery

## 2012-11-08 ENCOUNTER — Encounter (HOSPITAL_COMMUNITY): Payer: Self-pay

## 2012-11-08 DIAGNOSIS — Z01818 Encounter for other preprocedural examination: Secondary | ICD-10-CM | POA: Insufficient documentation

## 2012-11-08 DIAGNOSIS — Z01812 Encounter for preprocedural laboratory examination: Secondary | ICD-10-CM | POA: Insufficient documentation

## 2012-11-08 DIAGNOSIS — Z01810 Encounter for preprocedural cardiovascular examination: Secondary | ICD-10-CM | POA: Insufficient documentation

## 2012-11-08 HISTORY — DX: Peripheral vascular disease, unspecified: I73.9

## 2012-11-08 LAB — CBC
Hemoglobin: 14.2 g/dL (ref 13.0–17.0)
MCH: 30.8 pg (ref 26.0–34.0)
MCV: 90.5 fL (ref 78.0–100.0)
Platelets: 166 10*3/uL (ref 150–400)
RBC: 4.61 MIL/uL (ref 4.22–5.81)
WBC: 5.8 10*3/uL (ref 4.0–10.5)

## 2012-11-08 LAB — BASIC METABOLIC PANEL
BUN: 17 mg/dL (ref 6–23)
CO2: 25 mEq/L (ref 19–32)
Calcium: 9.4 mg/dL (ref 8.4–10.5)
Chloride: 102 mEq/L (ref 96–112)
Creatinine, Ser: 0.9 mg/dL (ref 0.50–1.35)
GFR calc Af Amer: >90 mL/min (ref >90)
Sodium: 138 mEq/L (ref 135–145)

## 2012-11-08 LAB — PROTIME-INR: INR: 0.98 (ref 0.00–1.49)

## 2012-11-08 LAB — APTT: aPTT: 35 seconds (ref 24–37)

## 2012-11-08 LAB — TYPE AND SCREEN
ABO/RH(D): O POS
Antibody Screen: NEGATIVE

## 2012-11-08 LAB — ABO/RH: ABO/RH(D): O POS

## 2012-11-08 NOTE — Progress Notes (Signed)
Positive for obstructive sleep apnea with "stop-bang tool

## 2012-11-09 NOTE — Progress Notes (Signed)
Anesthesia Chart Review: Patient is a 62 year old male scheduled for L3-4, L4-5 PLIF on 11/14/12 by Dr. Franky Macho. History includes non-smoker, CAD s/p DIAG DES '09 with failed intervention to diagonal branch 08/2010, DM2, HLD, PAD, varicose vein procedure '95, HTN, DJD, right TKA, tonsillectomy, appendectomy, left rotator cuff repair.  PCP is Dr. Herb Grays.  Cardiologist is Dr. Daleen Squibb who cleared patient for this procedure.  Cardiac cath on 08/25/10 showed normal LM, LAD and LCx with luminal irregularities. D2 with a patent DES, however, there was a 99% stenosis just prior to the stent with unsuccessful attempt at intervention. RCA small, non-dominant vessel. Medical therapy was recommended.  Nuclear stress test on 05/05/09 was normal, EF 60%.  Preoperative EKG, CXR, and labs noted.  If no acute changes then I anticipate that he can proceed as planned.  Velna Ochs [facility]/Anesthesiology Phone [phone] 11/09/2012 4:14 PM

## 2012-11-13 MED ORDER — CEFAZOLIN SODIUM-DEXTROSE 2-3 GM-% IV SOLR
2.0000 g | INTRAVENOUS | Status: AC
  Administered 2012-11-14 (×2): 2 g via INTRAVENOUS
  Filled 2012-11-13: qty 50

## 2012-11-14 ENCOUNTER — Inpatient Hospital Stay (HOSPITAL_COMMUNITY): Payer: 59

## 2012-11-14 ENCOUNTER — Encounter (HOSPITAL_COMMUNITY): Payer: 59 | Admitting: Vascular Surgery

## 2012-11-14 ENCOUNTER — Encounter (HOSPITAL_COMMUNITY)
Admission: RE | Disposition: A | Discharge status: Home / Self Care | Payer: 59 | Source: Ambulatory Visit | Attending: Neurosurgery

## 2012-11-14 ENCOUNTER — Encounter (HOSPITAL_COMMUNITY): Payer: Self-pay | Admitting: *Deleted

## 2012-11-14 ENCOUNTER — Inpatient Hospital Stay (HOSPITAL_COMMUNITY)
Admission: RE | Admit: 2012-11-14 | Discharge: 2012-11-19 | DRG: 460 | Disposition: A | Discharge status: Home / Self Care | Payer: 59 | Source: Ambulatory Visit | Attending: Neurosurgery | Admitting: Neurosurgery

## 2012-11-14 ENCOUNTER — Inpatient Hospital Stay (HOSPITAL_COMMUNITY): Payer: 59 | Admitting: Anesthesiology

## 2012-11-14 DIAGNOSIS — M51379 Other intervertebral disc degeneration, lumbosacral region without mention of lumbar back pain or lower extremity pain: Secondary | ICD-10-CM | POA: Diagnosis present

## 2012-11-14 DIAGNOSIS — M4316 Spondylolisthesis, lumbar region: Secondary | ICD-10-CM

## 2012-11-14 DIAGNOSIS — M47817 Spondylosis without myelopathy or radiculopathy, lumbosacral region: Principal | ICD-10-CM | POA: Diagnosis present

## 2012-11-14 DIAGNOSIS — Z7902 Long term (current) use of antithrombotics/antiplatelets: Secondary | ICD-10-CM

## 2012-11-14 DIAGNOSIS — Z79899 Other long term (current) drug therapy: Secondary | ICD-10-CM

## 2012-11-14 DIAGNOSIS — E7800 Pure hypercholesterolemia, unspecified: Secondary | ICD-10-CM | POA: Diagnosis present

## 2012-11-14 DIAGNOSIS — Z888 Allergy status to other drugs, medicaments and biological substances status: Secondary | ICD-10-CM

## 2012-11-14 DIAGNOSIS — G9741 Accidental puncture or laceration of dura during a procedure: Secondary | ICD-10-CM | POA: Diagnosis not present

## 2012-11-14 DIAGNOSIS — Y9210 Unspecified residential institution as the place of occurrence of the external cause: Secondary | ICD-10-CM | POA: Diagnosis not present

## 2012-11-14 DIAGNOSIS — Z9861 Coronary angioplasty status: Secondary | ICD-10-CM

## 2012-11-14 DIAGNOSIS — M5137 Other intervertebral disc degeneration, lumbosacral region: Secondary | ICD-10-CM | POA: Diagnosis present

## 2012-11-14 DIAGNOSIS — Q762 Congenital spondylolisthesis: Secondary | ICD-10-CM

## 2012-11-14 DIAGNOSIS — IMO0002 Reserved for concepts with insufficient information to code with codable children: Secondary | ICD-10-CM | POA: Diagnosis not present

## 2012-11-14 DIAGNOSIS — I252 Old myocardial infarction: Secondary | ICD-10-CM

## 2012-11-14 DIAGNOSIS — E119 Type 2 diabetes mellitus without complications: Secondary | ICD-10-CM | POA: Diagnosis present

## 2012-11-14 DIAGNOSIS — Z96659 Presence of unspecified artificial knee joint: Secondary | ICD-10-CM

## 2012-11-14 DIAGNOSIS — M7130 Other bursal cyst, unspecified site: Secondary | ICD-10-CM | POA: Diagnosis present

## 2012-11-14 LAB — GLUCOSE, CAPILLARY
Glucose-Capillary: 202 mg/dL — ABNORMAL HIGH (ref 70–99)
Glucose-Capillary: 88 mg/dL (ref 70–99)
Glucose-Capillary: 110 mg/dL — ABNORMAL HIGH (ref 70–99)
Glucose-Capillary: 185 mg/dL — ABNORMAL HIGH (ref 70–99)

## 2012-11-14 SURGERY — POSTERIOR LUMBAR FUSION 2 LEVEL
Anesthesia: General | Site: Back | Wound class: Clean

## 2012-11-14 MED ORDER — OXYCODONE-ACETAMINOPHEN 5-325 MG PO TABS
1.0000 | ORAL_TABLET | ORAL | Status: DC | PRN
  Administered 2012-11-14 – 2012-11-15 (×5): 2 via ORAL
  Administered 2012-11-16 (×2): 1 via ORAL
  Administered 2012-11-16 – 2012-11-19 (×5): 2 via ORAL
  Filled 2012-11-14 (×7): qty 2
  Filled 2012-11-14: qty 1
  Filled 2012-11-14 (×2): qty 2
  Filled 2012-11-14: qty 1
  Filled 2012-11-14: qty 2

## 2012-11-14 MED ORDER — SODIUM CHLORIDE 0.9 % IV SOLN
10.0000 mg | INTRAVENOUS | Status: DC | PRN
  Administered 2012-11-14: 50 ug/min via INTRAVENOUS

## 2012-11-14 MED ORDER — MENTHOL 3 MG MT LOZG: 1.0000 | LOZENGE | OROMUCOSAL | Status: DC | PRN

## 2012-11-14 MED ORDER — NITROGLYCERIN 0.4 MG SL SUBL: 0.4000 mg | SUBLINGUAL_TABLET | SUBLINGUAL | Status: DC | PRN

## 2012-11-14 MED ORDER — SODIUM CHLORIDE 0.9 % IJ SOLN: 3.0000 mL | INTRAMUSCULAR | Status: DC | PRN

## 2012-11-14 MED ORDER — SODIUM CHLORIDE 0.9 % IJ SOLN
3.0000 mL | Freq: Two times a day (BID) | INTRAMUSCULAR | Status: DC
  Administered 2012-11-16 – 2012-11-17 (×2): 3 mL via INTRAVENOUS

## 2012-11-14 MED ORDER — OMEGA-3-ACID ETHYL ESTERS 1 G PO CAPS
2.0000 g | ORAL_CAPSULE | Freq: Two times a day (BID) | ORAL | Status: DC
  Administered 2012-11-14 – 2012-11-19 (×10): 2 g via ORAL
  Filled 2012-11-14 (×11): qty 2

## 2012-11-14 MED ORDER — ACETAMINOPHEN 325 MG PO TABS: 650.0000 mg | ORAL_TABLET | ORAL | Status: DC | PRN

## 2012-11-14 MED ORDER — 0.9 % SODIUM CHLORIDE (POUR BTL) OPTIME
TOPICAL | Status: DC | PRN
  Administered 2012-11-14 (×2): 1000 mL

## 2012-11-14 MED ORDER — OXYCODONE HCL 5 MG PO TABS
5.0000 mg | ORAL_TABLET | Freq: Once | ORAL | Status: DC | PRN
Start: 2012-11-14 — End: 2012-11-14

## 2012-11-14 MED ORDER — ACETAMINOPHEN 650 MG RE SUPP: 650.0000 mg | RECTAL | Status: DC | PRN

## 2012-11-14 MED ORDER — SODIUM CHLORIDE 0.9 % IV SOLN: 250.0000 mL | INTRAVENOUS | Status: DC

## 2012-11-14 MED ORDER — LIDOCAINE-EPINEPHRINE 0.5 %-1:200000 IJ SOLN
INTRAMUSCULAR | Status: DC | PRN
  Administered 2012-11-14: 10 mL

## 2012-11-14 MED ORDER — NEOSTIGMINE METHYLSULFATE 1 MG/ML IJ SOLN
INTRAMUSCULAR | Status: DC | PRN
  Administered 2012-11-14: 4 mg via INTRAVENOUS

## 2012-11-14 MED ORDER — LACTATED RINGERS IV SOLN
INTRAVENOUS | Status: DC
  Administered 2012-11-14 (×3): via INTRAVENOUS

## 2012-11-14 MED ORDER — GLIMEPIRIDE 4 MG PO TABS
8.0000 mg | ORAL_TABLET | Freq: Every day | ORAL | Status: DC
  Administered 2012-11-15 – 2012-11-19 (×5): 8 mg via ORAL
  Filled 2012-11-14 (×6): qty 2

## 2012-11-14 MED ORDER — PHENOL 1.4 % MT LIQD: 1.0000 | OROMUCOSAL | Status: DC | PRN

## 2012-11-14 MED ORDER — MIDAZOLAM HCL 5 MG/5ML IJ SOLN
INTRAMUSCULAR | Status: DC | PRN
  Administered 2012-11-14: 2 mg via INTRAVENOUS

## 2012-11-14 MED ORDER — GLYCOPYRROLATE 0.2 MG/ML IJ SOLN
INTRAMUSCULAR | Status: DC | PRN
  Administered 2012-11-14: .2 mg via INTRAVENOUS
  Administered 2012-11-14: .8 mg via INTRAVENOUS

## 2012-11-14 MED ORDER — LIDOCAINE HCL (CARDIAC) 20 MG/ML IV SOLN
INTRAVENOUS | Status: DC | PRN
  Administered 2012-11-14: 50 mg via INTRAVENOUS

## 2012-11-14 MED ORDER — THROMBIN 20000 UNITS EX SOLR
CUTANEOUS | Status: DC | PRN
  Administered 2012-11-14: 12:00:00 via TOPICAL

## 2012-11-14 MED ORDER — KETOROLAC TROMETHAMINE 30 MG/ML IJ SOLN: 30.0000 mg | Freq: Once | INTRAMUSCULAR | Status: DC

## 2012-11-14 MED ORDER — ARTIFICIAL TEARS OP OINT
TOPICAL_OINTMENT | OPHTHALMIC | Status: DC | PRN
  Administered 2012-11-14: 1 via OPHTHALMIC

## 2012-11-14 MED ORDER — CEFAZOLIN SODIUM-DEXTROSE 2-3 GM-% IV SOLR
INTRAVENOUS | Status: AC
  Filled 2012-11-14: qty 50

## 2012-11-14 MED ORDER — PROPOFOL 10 MG/ML IV BOLUS
INTRAVENOUS | Status: DC | PRN
  Administered 2012-11-14: 200 mg via INTRAVENOUS

## 2012-11-14 MED ORDER — VECURONIUM BROMIDE 10 MG IV SOLR
INTRAVENOUS | Status: DC | PRN
  Administered 2012-11-14 (×2): 1 mg via INTRAVENOUS
  Administered 2012-11-14 (×3): 2 mg via INTRAVENOUS
  Administered 2012-11-14: 1 mg via INTRAVENOUS

## 2012-11-14 MED ORDER — METFORMIN HCL 500 MG PO TABS
1000.0000 mg | ORAL_TABLET | Freq: Two times a day (BID) | ORAL | Status: DC
  Filled 2012-11-14: qty 3

## 2012-11-14 MED ORDER — FENTANYL CITRATE 0.05 MG/ML IJ SOLN
INTRAMUSCULAR | Status: DC | PRN
  Administered 2012-11-14 (×4): 50 ug via INTRAVENOUS
  Administered 2012-11-14: 250 ug via INTRAVENOUS

## 2012-11-14 MED ORDER — METFORMIN HCL 500 MG PO TABS
1000.0000 mg | ORAL_TABLET | Freq: Every day | ORAL | Status: DC
  Administered 2012-11-15 – 2012-11-19 (×5): 1000 mg via ORAL
  Filled 2012-11-14 (×5): qty 2

## 2012-11-14 MED ORDER — HYDROMORPHONE HCL PF 1 MG/ML IJ SOLN: 0.2500 mg | INTRAMUSCULAR | Status: DC | PRN

## 2012-11-14 MED ORDER — ALBUMIN HUMAN 5 % IV SOLN
INTRAVENOUS | Status: DC | PRN
  Administered 2012-11-14 (×2): via INTRAVENOUS

## 2012-11-14 MED ORDER — OXYCODONE HCL 5 MG/5ML PO SOLN: 5.0000 mg | Freq: Once | ORAL | Status: DC | PRN

## 2012-11-14 MED ORDER — DIAZEPAM 5 MG PO TABS
5.0000 mg | ORAL_TABLET | Freq: Four times a day (QID) | ORAL | Status: DC | PRN
  Administered 2012-11-14 – 2012-11-16 (×4): 5 mg via ORAL
  Filled 2012-11-14 (×4): qty 1

## 2012-11-14 MED ORDER — HYDROMORPHONE HCL PF 1 MG/ML IJ SOLN
0.5000 mg | INTRAMUSCULAR | Status: DC | PRN
  Administered 2012-11-16: 1 mg via INTRAVENOUS
  Filled 2012-11-14: qty 1

## 2012-11-14 MED ORDER — ONDANSETRON HCL 4 MG/2ML IJ SOLN
INTRAMUSCULAR | Status: DC | PRN
  Administered 2012-11-14: 4 mg via INTRAVENOUS

## 2012-11-14 MED ORDER — SENNA 8.6 MG PO TABS
1.0000 | ORAL_TABLET | Freq: Two times a day (BID) | ORAL | Status: DC
  Administered 2012-11-14 – 2012-11-19 (×7): 8.6 mg via ORAL
  Filled 2012-11-14 (×11): qty 1

## 2012-11-14 MED ORDER — HYDROCODONE-ACETAMINOPHEN 5-325 MG PO TABS: 1.0000 | ORAL_TABLET | ORAL | Status: DC | PRN

## 2012-11-14 MED ORDER — POLYETHYLENE GLYCOL 3350 17 G PO PACK
17.0000 g | PACK | Freq: Every day | ORAL | Status: DC | PRN
  Filled 2012-11-14: qty 1

## 2012-11-14 MED ORDER — ROCURONIUM BROMIDE 100 MG/10ML IV SOLN
INTRAVENOUS | Status: DC | PRN
  Administered 2012-11-14: 10 mg via INTRAVENOUS
  Administered 2012-11-14: 20 mg via INTRAVENOUS
  Administered 2012-11-14 (×2): 10 mg via INTRAVENOUS
  Administered 2012-11-14: 50 mg via INTRAVENOUS

## 2012-11-14 MED ORDER — POTASSIUM CHLORIDE IN NACL 20-0.9 MEQ/L-% IV SOLN
INTRAVENOUS | Status: DC
  Administered 2012-11-14 – 2012-11-15 (×3): via INTRAVENOUS
  Filled 2012-11-14 (×6): qty 1000

## 2012-11-14 MED ORDER — METFORMIN HCL 500 MG PO TABS
1500.0000 mg | ORAL_TABLET | Freq: Every day | ORAL | Status: DC
  Administered 2012-11-15 – 2012-11-19 (×5): 1500 mg via ORAL
  Filled 2012-11-14 (×6): qty 3

## 2012-11-14 MED ORDER — ONDANSETRON HCL 4 MG/2ML IJ SOLN: 4.0000 mg | INTRAMUSCULAR | Status: DC | PRN

## 2012-11-14 MED ORDER — SODIUM CHLORIDE 0.9 % IV SOLN
INTRAVENOUS | Status: DC | PRN
  Administered 2012-11-14: 17:00:00 via INTRAVENOUS

## 2012-11-14 SURGICAL SUPPLY — 69 items
ADH SKN CLS APL DERMABOND .7 (GAUZE/BANDAGES/DRESSINGS) ×1
APL SKNCLS STERI-STRIP NONHPOA (GAUZE/BANDAGES/DRESSINGS) ×1
APL SRG 60D 8 XTD TIP BNDBL (TIP) ×1
BAG DECANTER FOR FLEXI CONT (MISCELLANEOUS) ×2 IMPLANT
BENZOIN TINCTURE PRP APPL 2/3 (GAUZE/BANDAGES/DRESSINGS) ×1 IMPLANT
BLADE SURG ROTATE 9660 (MISCELLANEOUS) IMPLANT
BUR MATCHSTICK NEURO 3.0 LAGG (BURR) ×2 IMPLANT
CAGE COROENT MP 11X23X9 (Cage) ×2 IMPLANT
CANISTER SUCT 3000ML (MISCELLANEOUS) ×3 IMPLANT
CONT SPEC 4OZ CLIKSEAL STRL BL (MISCELLANEOUS) ×2 IMPLANT
COVER BACK TABLE 24X17X13 BIG (DRAPES) IMPLANT
DECANTER SPIKE VIAL GLASS SM (MISCELLANEOUS) ×2 IMPLANT
DERMABOND ADVANCED (GAUZE/BANDAGES/DRESSINGS) ×1
DERMABOND ADVANCED .7 DNX12 (GAUZE/BANDAGES/DRESSINGS) ×1 IMPLANT
DRAPE C-ARM 42X72 X-RAY (DRAPES) ×4 IMPLANT
DRAPE C-ARMOR (DRAPES) ×1 IMPLANT
DRAPE LAPAROTOMY 100X72X124 (DRAPES) ×2 IMPLANT
DRAPE POUCH INSTRU U-SHP 10X18 (DRAPES) ×2 IMPLANT
DRAPE SURG 17X23 STRL (DRAPES) ×2 IMPLANT
DRESSING TELFA 8X3 (GAUZE/BANDAGES/DRESSINGS) IMPLANT
DRSG OPSITE POSTOP 4X8 (GAUZE/BANDAGES/DRESSINGS) ×1 IMPLANT
DURAFORM COLLAGEN 1X1 5-PACK (Neuro Prosthesis/Implant) ×1 IMPLANT
DURAPREP 26ML APPLICATOR (WOUND CARE) ×3 IMPLANT
DURASEAL APPLICATOR TIP (TIP) ×1 IMPLANT
DURASEAL SPINE SEALANT 3ML (MISCELLANEOUS) ×1 IMPLANT
ELECT REM PT RETURN 9FT ADLT (ELECTROSURGICAL) ×2
ELECTRODE REM PT RTRN 9FT ADLT (ELECTROSURGICAL) ×1 IMPLANT
GAUZE SPONGE 4X4 16PLY XRAY LF (GAUZE/BANDAGES/DRESSINGS) IMPLANT
GLOVE ECLIPSE 6.5 STRL STRAW (GLOVE) ×4 IMPLANT
GLOVE EXAM NITRILE LRG STRL (GLOVE) IMPLANT
GLOVE EXAM NITRILE MD LF STRL (GLOVE) IMPLANT
GLOVE EXAM NITRILE XL STR (GLOVE) IMPLANT
GLOVE EXAM NITRILE XS STR PU (GLOVE) IMPLANT
GLOVE INDICATOR 8.5 STRL (GLOVE) ×1 IMPLANT
GLOVE SURG SS PI 8.0 STRL IVOR (GLOVE) ×1 IMPLANT
GOWN BRE IMP SLV AUR LG STRL (GOWN DISPOSABLE) ×4 IMPLANT
GOWN BRE IMP SLV AUR XL STRL (GOWN DISPOSABLE) ×1 IMPLANT
GOWN STRL REIN 2XL LVL4 (GOWN DISPOSABLE) IMPLANT
KIT BASIN OR (CUSTOM PROCEDURE TRAY) ×2 IMPLANT
KIT POSITION SURG JACKSON T1 (MISCELLANEOUS) ×2 IMPLANT
KIT ROOM TURNOVER OR (KITS) ×2 IMPLANT
MILL MEDIUM DISP (BLADE) ×1 IMPLANT
NDL HYPO 25X1 1.5 SAFETY (NEEDLE) ×1 IMPLANT
NDL SPNL 18GX3.5 QUINCKE PK (NEEDLE) IMPLANT
NEEDLE HYPO 25X1 1.5 SAFETY (NEEDLE) ×2 IMPLANT
NEEDLE SPNL 18GX3.5 QUINCKE PK (NEEDLE) IMPLANT
NS IRRIG 1000ML POUR BTL (IV SOLUTION) ×3 IMPLANT
PACK LAMINECTOMY NEURO (CUSTOM PROCEDURE TRAY) ×2 IMPLANT
PAD ARMBOARD 7.5X6 YLW CONV (MISCELLANEOUS) ×6 IMPLANT
PATTIES SURGICAL .5 X.5 (GAUZE/BANDAGES/DRESSINGS) ×1 IMPLANT
ROD 50MM LUMBAR (Rod) ×2 IMPLANT
SCREW LOCK (Screw) ×12 IMPLANT
SCREW LOCK FXNS SPNE MAS PL (Screw) IMPLANT
SCREW PRECEPT 6.5X45 (Screw) ×2 IMPLANT
SCREW SHANK 6.5X45 (Screw) ×4 IMPLANT
SCREW TULIP 5.5 (Screw) ×4 IMPLANT
SPONGE GAUZE 4X4 12PLY (GAUZE/BANDAGES/DRESSINGS) IMPLANT
SPONGE LAP 4X18 X RAY DECT (DISPOSABLE) IMPLANT
SPONGE SURGIFOAM ABS GEL 100 (HEMOSTASIS) ×3 IMPLANT
STRIP CLOSURE SKIN 1/2X4 (GAUZE/BANDAGES/DRESSINGS) ×1 IMPLANT
SUT PROLENE 6 0 BV (SUTURE) ×2 IMPLANT
SUT VIC AB 0 CT1 18XCR BRD8 (SUTURE) ×1 IMPLANT
SUT VIC AB 0 CT1 8-18 (SUTURE) ×4
SUT VIC AB 2-0 CT1 18 (SUTURE) ×2 IMPLANT
SUT VIC AB 3-0 SH 8-18 (SUTURE) ×2 IMPLANT
SYR 20ML ECCENTRIC (SYRINGE) ×2 IMPLANT
TOWEL OR 17X24 6PK STRL BLUE (TOWEL DISPOSABLE) ×2 IMPLANT
TOWEL OR 17X26 10 PK STRL BLUE (TOWEL DISPOSABLE) ×2 IMPLANT
WATER STERILE IRR 1000ML POUR (IV SOLUTION) ×2 IMPLANT

## 2012-11-14 NOTE — Anesthesia Preprocedure Evaluation (Addendum)
Anesthesia Evaluation  Patient identified by MRN, date of birth, ID band Patient awake    Reviewed: Allergy & Precautions, H&P , NPO status , Patient's Chart, lab work & pertinent test results  Airway Mallampati: III TM Distance: >3 FB Neck ROM: Full    Dental no notable dental hx. (+) Teeth Intact and Dental Advisory Given   Pulmonary neg pulmonary ROS,  breath sounds clear to auscultation  Pulmonary exam normal       Cardiovascular hypertension, + CAD and + Cardiac Stents Rhythm:Regular Rate:Normal     Neuro/Psych negative neurological ROS  negative psych ROS   GI/Hepatic Neg liver ROS, GERD-  Controlled,  Endo/Other  diabetes, Type 2, Oral Hypoglycemic Agents  Renal/GU negative Renal ROS  negative genitourinary   Musculoskeletal   Abdominal   Peds  Hematology negative hematology ROS (+)   Anesthesia Other Findings   Reproductive/Obstetrics negative OB ROS                          Anesthesia Physical Anesthesia Plan  ASA: III  Anesthesia Plan: General   Post-op Pain Management:    Induction: Intravenous  Airway Management Planned: Oral ETT  Additional Equipment:   Intra-op Plan:   Post-operative Plan: Extubation in OR  Informed Consent: I have reviewed the patients History and Physical, chart, labs and discussed the procedure including the risks, benefits and alternatives for the proposed anesthesia with the patient or authorized representative who has indicated his/her understanding and acceptance.   Dental advisory given  Plan Discussed with: CRNA  Anesthesia Plan Comments:         Anesthesia Quick Evaluation

## 2012-11-14 NOTE — Preoperative (Signed)
Beta Blockers   Reason not to administer Beta Blockers:Not Applicable 

## 2012-11-14 NOTE — Transfer of Care (Signed)
Immediate Anesthesia Transfer of Care Note  Patient: Chad Heier Sr.  Procedure(s) Performed: Procedure(s) with comments: POSTERIOR LUMBAR INTERBODYFUSION LUMBAR THREE-FOUR LUMBAR FOUR-FIVE - POSTERIOR LUMBAR INTERBODYFUSION LUMBAR THREE-FOUR LUMBAR FOUR-FIVE  Patient Location: PACU  Anesthesia Type:General  Level of Consciousness: awake and alert   Airway & Oxygen Therapy: Patient Spontanous Breathing and Patient connected to nasal cannula oxygen  Post-op Assessment: Report given to PACU RN and Post -op Vital signs reviewed and stable  Post vital signs: Reviewed and stable  Complications: No apparent anesthesia complications

## 2012-11-14 NOTE — Anesthesia Procedure Notes (Signed)
Procedure Name: Intubation Date/Time: 11/14/2012 12:11 PM Performed by: Carmela Rima Pre-anesthesia Checklist: Patient identified, Timeout performed, Emergency Drugs available, Suction available and Patient being monitored Patient Re-evaluated:Patient Re-evaluated prior to inductionOxygen Delivery Method: Circle system utilized Preoxygenation: Pre-oxygenation with 100% oxygen Intubation Type: IV induction Ventilation: Mask ventilation without difficulty Laryngoscope Size: Mac and 3 Grade View: Grade II Tube type: Oral Tube size: 7.5 mm Number of attempts: 1 Placement Confirmation: ETT inserted through vocal cords under direct vision,  positive ETCO2 and breath sounds checked- equal and bilateral Secured at: 23 cm Tube secured with: Tape Dental Injury: Teeth and Oropharynx as per pre-operative assessment

## 2012-11-14 NOTE — Anesthesia Postprocedure Evaluation (Signed)
Anesthesia Post Note  Patient: Chad Epple Sr.  Procedure(s) Performed: Procedure(s): POSTERIOR LUMBAR INTERBODYFUSION LUMBAR THREE-FOUR LUMBAR FOUR-FIVE  Anesthesia type: General  Patient location: PACU  Post pain: Pain level controlled and Adequate analgesia  Post assessment: Post-op Vital signs reviewed, Patient's Cardiovascular Status Stable, Respiratory Function Stable, Patent Airway and Pain level controlled  Last Vitals:  Filed Vitals:   11/14/12 2015  BP: 154/78  Pulse: 67  Temp: 36.3 C  Resp: 18    Post vital signs: Reviewed and stable  Level of consciousness: awake, alert  and oriented  Complications: No apparent anesthesia complications

## 2012-11-14 NOTE — H&P (Signed)
Today I saw Chad Yu in the office for evaluation of an electrical-like sensation going down his legs. He states he has had this for now about a month. No antecedent trauma. He was doing some cleanup at his home bending over and felt sharp pain in his back, but he is not sure if this has any relation to it. 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 He has this electrical sensation in both legs. No real pain involved. His arms feel fine. The sensation in the legs is something which is always present. Past medical history: Illnesses: Significant for myocardial infarction and diabetes. Surgeries: He has had stents placed. He has undergone a right knee replacement and left rotator cuff surgery. Medications: Metformin, clopidogrel, fenofibrate, and glimepiride. He takes hydrocodone and Spiriva. ALLERGIES: HE IS ALLERGIC TO STATINS AND SOME OTHER CHOLESTEROL MEDICATIONS. MORPHINE CAUSES HIM TO HAVE AN ALTERED HEART RATE. Family history: Mother is 61, in good health. Father is deceased. Cancer also present in the family history. Social history: He does not smoke. He does not use alcohol. He does not use illicit drugs. Review of systems: Positive for back pain, leg pain, arthritis, hypercholesterolemia and leg pain with walking along with diabetes and eye glasses. Denies Constitutional, Eye, Ears, Nose, Throat, Mouth, Respiratory, Gastrointestinal, Genitourinary, Skin, Neurological, Psychiatric, Hematologic, and Allergic problems. On his pain chart, he does list the sensation down the legs. Physical examination: He is 70.87 inches in height, weight is 198 pounds, blood pressure is 154/79, and pulse is 74. On examination, he is alert and oriented x4 and answers all questions appropriately. Memory, language, attention span and fund of knowledge are normal. Well kempt and in no distress. Proprioception is intact in both upper and lower extremities. Reflexes are not elicited except at  the brachioradialis bilaterally which are 1+, but the biceps, triceps, knees, and ankles are 0. Normal muscle tone, bulk and coordination. Romberg test is negative. He can toe walk and heel walk. Light touch is intact. Pinprick is intact in the lower and upper extremities and along the face. Pupils are equal, round and reactive to light. Full extraocular movements. Full visual fields. Hearing intact to voice. Uvula elevates in the midline. Shoulder shrug is normal. Tongue protrudes in the midline. Symmetric facial sensation and movement. Imaging Studies: No studies for review. Impression and Plan: I will have Chad Yu undergo an MRI of the lumbar spine. I will also see him in the office after he does that. I have no real idea why he has the electrical-like sensation in his back, but we will see what it shows.  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 He also has a pars defect of L4, anterolisthesis grade 1 of L4 and L5, what appears to be close to a fusion of those levels, significant canal compromise, and a synovial cyst present at the L3-4 facet joint causing stenosis. This is the reason that he has had these problems in his lower extremities.  INTERVAL PFSH: Unchanged from 08/28/2012.  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  PHYSICAL EXAMINATION: On exam, good strength in the lower extremities. He is 63.39 inches, weighs 242 pounds. Blood pressure is 152/81 today. Pulse is 71. BMI is 42.35.  IMPRESSION/PLAN: As I spoke to him, I explained that I think the only thing that can be done  is a lumbar decompression and subsequent fusion. He would like to wait until 10/2012 for personal reasons.  Chad Yu understands what it is that I have proposed. We did discuss what other possibilities there were, but I do not really think that there are other reasonable  options. Injections will not change his physical state, nor would physical therapy, and more time is of absolutely no use. I gave him a detailed instruction sheet with regards to the procedure.

## 2012-11-14 NOTE — Op Note (Signed)
11/14/2012  7:09 PM  PATIENT:  Chad Leriche Sr.  62 y.o. male  PRE-OPERATIVE DIAGNOSIS:  Spondylolisthesis L3/4,4/5, Lumbar stenosis, lumbar spondylosis, degenerative disc disease  POST-OPERATIVE DIAGNOSIS:  Spondylolisthesis L3/4,4/5, Lumbar stenosis, lumbar spondylosis, degenerative disc disease   PROCEDURE:  Procedure(s): POSTERIOR LUMBAR INTERBODYFUSION LUMBAR THREE-FOUR, 11mm nuvasive peek interbodies, morselized local autograft  Posterolateral arthrodesis L3/4,4/5 Lumbar decompression beyond what was necessary for a Plif at L3/4, complete L3 inferior  Facetectomies, decompression of the lateral recesses Segmental nuvasive medial to lateral pedicle screw fixation SURGEON:  Surgeon(s): Carmela Hurt, MD Mariam Dollar, MD  ASSISTANTS:Cram, Jillyn Hidden  ANESTHESIA:   general  EBL:   1 liter  BLOOD ADMINISTERED:460 CC CELLSAVER  CELL SAVER GIVEN:yes  COUNT:per nursing  DRAINS: none   SPECIMEN:  No Specimen  DICTATION: Chad Zieger Sr. is a 62 y.o. male whom was taken to the operating room intubated, and placed under a general anesthetic without difficulty. A foley catheter was placed under sterile conditions. He was positioned prone on a Jackson stable with all pressure points properly padded.  His lumbar region was prepped and draped in a sterile manner. I infiltrated 20cc's 1/2%lidocaine/1:2000,000 strength epinephrine into the planned incision. I opened the skin with a 10 blade and took the incision down to the thoracolumbar fascia. I exposed the lamina of L3,4,and 5 in a subperiosteal fashion bilaterally. I confirmed my location with an intraoperative xray.  I placed self retaining retractors and started the decompression.  I decompressed the spinal canal by performing a complete laminectomy of L3, and a small amount of L2 inferiorly, and the upper 1/3 of L4 bilaterally. I removed the inferior facets of L3 bilaterally to decompress the L3 roots throughout their course out of  the spinal canal. I performed the discetomies bilaterally at L3/4 using curettes, the Kerrison punches, and various instruments used in the disc space. The shavers, osteotomes, and rasps. When the disc, and soft tissue was removed and good bony surfaces exposed to accept the cages I measured the space to accept 11mm short cages. They were filled with autograft, and placed without difficulty into the disc space on each side. Bone was also packed anterior to the cages before they were placed.  I placed the pedicle screws with fluoroscopic guidance in a medal to lateral trajectory at each pedicle. I first drilled a small opening in the bone for an entry site, then drilled the pedicle, then tapped with a 6.93mm tap, then placed the pedicle screws. Final screw holes were checked for integrity and were without cutouts. All screws were 45mm in length(nuvasive hardware). I decorticated the lateral bony margins then placed the autograft over the bone to complete the lateral arthrodesis.  I checked position of the final construct after placing the rods with Dr. Dola Argyle assistance, and his help placing the screws. The construct looked good. I irrigated then closed the wound. There was a pinhole durotomy which I closed primarily using muscle to help seal the hole, along with duraseal. I closed the wound in layered fashion approximating the thoracolumbar fascia, subcutaneous, and subcuticular planes with vicryls, and the skin with a nylon. I applied a sterile dressing.    PLAN OF CARE: Admit to inpatient   PATIENT DISPOSITION:  PACU - hemodynamically stable.   Delay start of Pharmacological VTE agent (>24hrs) due to surgical blood loss or risk of bleeding:  yes

## 2012-11-15 MED ORDER — PNEUMOCOCCAL VAC POLYVALENT 25 MCG/0.5ML IJ INJ
0.5000 mL | INJECTION | INTRAMUSCULAR | Status: DC
  Filled 2012-11-15: qty 0.5

## 2012-11-15 NOTE — Progress Notes (Signed)
   CARE MANAGEMENT NOTE 11/15/2012  Patient:  Chad Yu, Chad Yu   Account Number:  [id number]  Date Initiated:  11/15/2012  Documentation initiated by:  Jiles Crocker  Subjective/Objective Assessment:   ADMITTED FOR SURGERY - LUMBAR DECOMPRESSION     Action/Plan:   CM FOLLOWING FOR DCP   Anticipated DC Date:  11/20/2012   Anticipated DC Plan:  POSSIBLY HOME W HOME HEALTH SERVICES, AWAITING  FOR PT/OT EVALS;     DC Planning Services  CM consult          Status of service:  In process, will continue to follow Medicare Important Message given?  NA - LOS <3 / Initial given by admissions (If response is "NO", the following Medicare IM given date fields will be blank)  Per UR Regulation:  Reviewed for med. necessity/level of care/duration of stay  Comments:  10/30/2014Abelino Derrick RN,BSN,MHA [phone]

## 2012-11-15 NOTE — Progress Notes (Signed)
OT Cancellation Note  Patient Details Name: Chad Altland Sr. MRN: [id number] DOB: November 30, 1950   Cancelled Treatment:      Reason Eval/Treat Not Completed - Cx medical: Patient not medically ready. RN notes pt is on 48hr bedrest due to dural tear. Please write orders for d/c bedrest and when pt can begin activity.   Roselie Awkward Dixon 11/15/2012, 9:24 AM

## 2012-11-15 NOTE — Progress Notes (Signed)
PT Cancellation Note  Patient Details Name: Chad Giraud Sr. MRN: [id number] DOB: 06-15-1950   Cancelled Treatment:    Reason Eval/Treat Not Completed: Patient not medically ready.  Spoke with RN who notes pt is on 48hr bedrest due to dural tear.  Please write orders for bedrest and when pt can begin activity.  Thanks.     Sunny Schlein, [place] [phone] 11/15/2012, 9:18 AM

## 2012-11-15 NOTE — Progress Notes (Signed)
Pt arrived to room from pacu around 2045 via stretcher bed. Pt arousal, follows command, MAEx4; Honey comb dsg to back dry and intact with scant old blood stain drainage noted. Pt denies any numbness or tingling and reports full sensation x. Spouse visited. Pt denies any pain or discomfort, placed on 2L oxygen; oriented to room with call light with in reach. Incoming RN notified. Will continue to monitor.

## 2012-11-15 NOTE — Progress Notes (Signed)
Patient ID: Chad Althaus Sr., male   DOB: 05/27/1950, 62 y.o.   MRN: [id number] BP 138/66  Pulse 95  Temp(Src) 99.4 F (37.4 C) (Oral)  Resp 20  Ht 5\' 11"  (1.803 m)  Wt 108.892 kg (240 lb 1 oz)  BMI 33.5 kg/m2  SpO2 95% Alert and oriented x 4 Moving lower extremities well Will stay in bed for now.

## 2012-11-16 LAB — GLUCOSE, CAPILLARY
Glucose-Capillary: 204 mg/dL — ABNORMAL HIGH (ref 70–99)
Glucose-Capillary: 149 mg/dL — ABNORMAL HIGH (ref 70–99)

## 2012-11-16 MED FILL — Sodium Chloride IV Soln 0.9%: INTRAVENOUS | Qty: 3000 | Status: AC

## 2012-11-16 MED FILL — Heparin Sodium (Porcine) Inj 1000 Unit/ML: INTRAMUSCULAR | Qty: 30 | Status: AC

## 2012-11-16 NOTE — Progress Notes (Signed)
PT Cancellation Note  Patient Details Name: Chad Catanzaro Sr. MRN: [id number] DOB: 05-Nov-1950   Cancelled Treatment:    Reason Eval/Treat Not Completed: Patient not medically ready.  Pt continues to be on bedrest.  Will f/u tomorrow.     Sunny Schlein, [place] [phone] 11/16/2012, 7:29 AM

## 2012-11-16 NOTE — Progress Notes (Signed)
OT Cancellation Note  Patient Details Name: Chad Sear Sr. MRN: [id number] DOB: Dec 06, 1950   Cancelled Treatment:     Reason Eval/Treat Not Completed: Patient not medically ready. Pt continues to be on bedrest. Will f/u as able.    Roselie Awkward Dixon 11/16/2012, 7:36 AM

## 2012-11-17 LAB — GLUCOSE, CAPILLARY: Glucose-Capillary: 184 mg/dL — ABNORMAL HIGH (ref 70–99)

## 2012-11-17 MED ORDER — BACITRACIN-NEOMYCIN-POLYMYXIN OINTMENT TUBE
TOPICAL_OINTMENT | CUTANEOUS | Status: DC | PRN
  Filled 2012-11-17: qty 15

## 2012-11-17 NOTE — Evaluation (Signed)
Physical Therapy Evaluation Patient Details Name: Chad Yu. MRN: [id number] DOB: 10-17-1950 Today's Date: 11/17/2012 Time: 4098-1191 PT Time Calculation (min): 23 min  PT Assessment / Plan / Recommendation History of Present Illness  Patient is a 62 yo male s/p L2-3 PLIF.  Patient with dural tear and on bedrest until 11/16/12 at 9:00 pm.  Clinical Impression  Patient presents with problems listed below.  Will benefit from acute PT to maximize independence prior to discharge home.  May need HHPT f/u depending on progress with mobility.    PT Assessment  Patient needs continued PT services    Follow Up Recommendations  Home health PT;Supervision/Assistance - 24 hour (Depending on progress )    Does the patient have the potential to tolerate intense rehabilitation      Barriers to Discharge        Equipment Recommendations  3in1 (PT)    Recommendations for Other Services     Frequency Min 5X/week    Precautions / Restrictions Precautions Precautions: Back Precaution Booklet Issued: Yes (comment) Precaution Comments: Reviewed back precautions and education with patient. Required Braces or Orthoses: Spinal Brace Spinal Brace: Lumbar corset;Applied in sitting position Restrictions Weight Bearing Restrictions: No   Pertinent Vitals/Pain       Mobility  Bed Mobility Bed Mobility: Rolling Right;Right Sidelying to Sit;Sitting - Scoot to Edge of Bed Rolling Right: 4: Min guard;With rail Right Sidelying to Sit: 4: Min assist;HOB flat;With rails Sitting - Scoot to Edge of Bed: 4: Min guard Details for Bed Mobility Assistance: Verbal cues for technique to maintain back precautions.  Assist to raise trunk to sitting position.  Mod assist to don back brace. Transfers Transfers: Sit to Stand;Stand to Sit Sit to Stand: 4: Min assist;With upper extremity assist;From bed Stand to Sit: 4: Min assist;With upper extremity assist;With armrests;To chair/3-in-1 Details for  Transfer Assistance: Verbal cues for hand placement and technique.  Assist to rise to standing and for balance initially. Ambulation/Gait Ambulation/Gait Assistance: 4: Min assist Ambulation Distance (Feet): 160 Feet Assistive device: Rolling walker Ambulation/Gait Assistance Details: Verbal cues for safe use of RW.  Cues to stand upright and look forward.  Patient with flexed posture, looking at floor. Gait Pattern: Step-through pattern;Decreased stride length;Trunk flexed Gait velocity: Slow gait speed    Exercises     PT Diagnosis: Difficulty walking;Acute pain;Generalized weakness  PT Problem List: Decreased strength;Decreased activity tolerance;Decreased balance;Decreased mobility;Decreased knowledge of use of DME;Decreased knowledge of precautions;Pain PT Treatment Interventions: DME instruction;Gait training;Stair training;Functional mobility training;Patient/family education     PT Goals(Current goals can be found in the care plan section) Acute Rehab PT Goals Patient Stated Goal: To get stronger PT Goal Formulation: With patient Time For Goal Achievement: 11/24/12 Potential to Achieve Goals: Good  Visit Information  Last PT Received On: 11/17/12 Assistance Needed: +1 History of Present Illness: Patient is a 62 yo male s/p L2-3 PLIF.  Patient with dural tear and on bedrest until 11/16/12 at 9:00 pm.       Prior Functioning  Home Living Family/patient expects to be discharged to:: Private residence (Wife's home) Living Arrangements: Spouse/significant other Available Help at Discharge: Family;Available 24 hours/day Type of Home: House Home Access: Stairs to enter [employer] of Steps: 2 Entrance Stairs-Rails: None Home Layout: One level Home Equipment: Walker - 4 wheels;Shower seat - built in Prior Function Level of Independence: Independent Comments: Works full time - operates backhoe. Communication Communication: No difficulties    Cognition   Cognition Arousal/Alertness: Awake/alert Behavior  During Therapy: WFL for tasks assessed/performed Overall Cognitive Status: Within Functional Limits for tasks assessed    Extremity/Trunk Assessment Upper Extremity Assessment Upper Extremity Assessment: Overall WFL for tasks assessed Lower Extremity Assessment Lower Extremity Assessment: Overall WFL for tasks assessed   Balance    End of Session PT - End of Session Equipment Utilized During Treatment: Gait belt;Back brace Activity Tolerance: Patient limited by fatigue;Patient limited by pain Patient left: in chair;with call bell/phone within reach Nurse Communication: Mobility status  GP     Vena Austria 11/17/2012, 11:39 AM Durenda Hurt. Renaldo Fiddler, [facility] Acute Rehab Services Pager [phone]

## 2012-11-17 NOTE — Progress Notes (Signed)
Doing well. C/o appropriate incisional soreness. Amb/ voiding well No HA  Temp:  [99.7 F (37.6 C)-101.2 F (38.4 C)] 99.8 F (37.7 C) (11/01 0540) Pulse Rate:  [77-98] 77 (11/01 0540) Resp:  [18] 18 (11/01 0540) BP: (112-164)/(59-91) 112/59 mmHg (11/01 0540) SpO2:  [95 %-98 %] 98 % (11/01 0540) Good strength and sensation Incision CDI  Plan: Increase activity  -

## 2012-11-18 LAB — GLUCOSE, CAPILLARY
Glucose-Capillary: 186 mg/dL — ABNORMAL HIGH (ref 70–99)
Glucose-Capillary: 164 mg/dL — ABNORMAL HIGH (ref 70–99)
Glucose-Capillary: 176 mg/dL — ABNORMAL HIGH (ref 70–99)
Glucose-Capillary: 278 mg/dL — ABNORMAL HIGH (ref 70–99)

## 2012-11-18 MED ORDER — INSULIN ASPART 100 UNIT/ML ~~LOC~~ SOLN: 0.0000 [IU] | Freq: Every day | SUBCUTANEOUS | Status: DC

## 2012-11-18 MED ORDER — INSULIN ASPART 100 UNIT/ML ~~LOC~~ SOLN
0.0000 [IU] | Freq: Three times a day (TID) | SUBCUTANEOUS | Status: DC
  Administered 2012-11-19 (×3): 3 [IU] via SUBCUTANEOUS

## 2012-11-18 NOTE — Progress Notes (Signed)
Physical Therapy Treatment Patient Details Name: Chad Yu. MRN: [id number] DOB: 1950/08/05 Today's Date: 11/18/2012 Time: 9562-1308 PT Time Calculation (min): 16 min  PT Assessment / Plan / Recommendation  History of Present Illness Patient is a 61 yo male s/p L2-3 PLIF.  Patient with dural tear and on bedrest until 11/16/12 at 9:00 pm.   PT Comments   Patient making good progress with mobility and gait.  Able to negotiate steps with 1 rail.  Recommend HHPT for continued therapy.  Follow Up Recommendations  Home health PT;Supervision/Assistance - 24 hour     Does the patient have the potential to tolerate intense rehabilitation     Barriers to Discharge        Equipment Recommendations  3in1 (PT)    Recommendations for Other Services    Frequency Min 5X/week   Progress towards PT Goals Progress towards PT goals: Progressing toward goals  Plan Current plan remains appropriate    Precautions / Restrictions Precautions Precautions: Back Precaution Comments: Patient able to recall 2/3 back precautions.  Reviewed with patient. Required Braces or Orthoses: Spinal Brace Spinal Brace: Lumbar corset;Applied in sitting position Restrictions Weight Bearing Restrictions: No   Pertinent Vitals/Pain     Mobility  Bed Mobility Bed Mobility: Rolling Right;Right Sidelying to Sit;Sitting - Scoot to Edge of Bed Rolling Right: 5: Supervision;With rail Right Sidelying to Sit: 5: Supervision;With rails;HOB elevated Sitting - Scoot to Edge of Bed: 5: Supervision Details for Bed Mobility Assistance: Verbal cues to maintain back precautions.  Assist for safety.  Patient able to don back brace independently. Transfers Transfers: Sit to Stand;Stand to Sit Sit to Stand: 4: Min guard;With upper extremity assist;From bed Stand to Sit: 4: Min guard;With upper extremity assist;With armrests;To chair/3-in-1 Details for Transfer Assistance: Verbal cues for hand placement - attempts to reach  for RW to pull up.  Assist for balance/safety. Ambulation/Gait Ambulation/Gait Assistance: 4: Min guard Ambulation Distance (Feet): 250 Feet Assistive device: Rolling walker Ambulation/Gait Assistance Details: Patient demonstrates safe use of RW.  Verbal cues to stand upright and look up during gait. Gait Pattern: Step-through pattern;Decreased stride length Gait velocity: Slow gait speed Stairs: Yes Stairs Assistance: 4: Min guard Stair Management Technique: One rail Right;Alternating pattern;Step to pattern;Forwards Number of Stairs: 2 (practiced up/down x2)      PT Goals (current goals can now be found in the care plan section)    Visit Information  Last PT Received On: 11/18/12 Assistance Needed: +1 History of Present Illness: Patient is a 62 yo male s/p L2-3 PLIF.  Patient with dural tear and on bedrest until 11/16/12 at 9:00 pm.    Subjective Data  Subjective: "I think I'm going home tomorrow   Cognition  Cognition Arousal/Alertness: Awake/alert Behavior During Therapy: WFL for tasks assessed/performed Overall Cognitive Status: Within Functional Limits for tasks assessed    Balance     End of Session PT - End of Session Equipment Utilized During Treatment: Gait belt;Back brace Activity Tolerance: Patient tolerated treatment well Patient left: in chair;with call bell/phone within reach Nurse Communication: Mobility status   GP     Vena Austria 11/18/2012, 11:55 AM Durenda Hurt. Renaldo Fiddler, [facility] Acute Rehab Services Pager [phone]

## 2012-11-18 NOTE — Evaluation (Signed)
Occupational Therapy Evaluation and Discharge Patient Details Name: Chad Stordahl Sr. MRN: [id number] DOB: [birth year] Today's Date: 11/18/2012 Time: 1212-1226 OT Time Calculation (min): 14 min  OT Assessment / Plan / Recommendation History of present illness Patient is a 62 yo male s/p L2-3 PLIF.  Patient with dural tear and on bedrest until 11/16/12 at 9:00 pm.   Clinical Impression   This 62 yo male s/p above presents to acute OT with all education completed, all DME needed per his report, and wife to A prn. Will D/C pt from acute OT.    OT Assessment  Patient does not need any further OT services    Follow Up Recommendations  No OT follow up       Equipment Recommendations  None recommended by OT          Precautions / Restrictions Precautions Precautions: Back Precaution Comments: Patient able to recall 3/3 back precautions.  Reviewed with patient. Required Braces or Orthoses: Spinal Brace Spinal Brace: Lumbar corset;Applied in sitting position Restrictions Weight Bearing Restrictions: No       ADL  Eating/Feeding: Independent Where Assessed - Eating/Feeding: Chair Grooming: Set up Where Assessed - Grooming: Unsupported standing Upper Body Bathing: Set up Where Assessed - Upper Body Bathing: Unsupported sit to stand Lower Body Bathing: Set up;Supervision/safety (without AE, can cross legs to get to feet while seated) Where Assessed - Lower Body Bathing: Unsupported sit to stand Upper Body Dressing: Set up Where Assessed - Upper Body Dressing: Unsupported sitting Lower Body Dressing: Supervision/safety;Set up (without AE, can cross legs to get to feet while seated) Where Assessed - Lower Body Dressing: Unsupported sit to stand Toilet Transfer: Supervision/safety Toilet Transfer Method: Sit to Barista:  (Recliner around to other side of bed) Toileting - Clothing Manipulation and Hygiene: Set up (talked about pt using wet wipes v. toliet  paper to avoid twisting) Where Assessed - Toileting Clothing Manipulation and Hygiene: Sit to stand from 3-in-1 or toilet Equipment Used: Rolling walker;Back brace Transfers/Ambulation Related to ADLs: S for all with RW ADL Comments: wife can A prn      Acute Rehab OT Goals Patient Stated Goal: To go home tomorrow  Visit Information  Last OT Received On: 11/18/12 Assistance Needed: +1 History of Present Illness: Patient is a 62 yo male s/p L2-3 PLIF.  Patient with dural tear and on bedrest until 11/16/12 at 9:00 pm.       Prior Functioning     Home Living Family/patient expects to be discharged to:: Private residence Living Arrangements: Spouse/significant other Available Help at Discharge: Available 24 hours/day;Family Type of Home: House Home Access: Stairs to enter [employer] of Steps: 2 Entrance Stairs-Rails: None Home Layout: One level Home Equipment: Walker - 4 wheels;Shower seat - built in;Shower seat; 3n1 Prior Function Level of Independence: Independent Comments: Works full time - operates backhoe. Communication Communication: No difficulties Dominant Hand: Right         Vision/Perception Vision - History Patient Visual Report: No change from baseline   Cognition  Cognition Arousal/Alertness: Awake/alert Behavior During Therapy: WFL for tasks assessed/performed Overall Cognitive Status: Within Functional Limits for tasks assessed    Extremity/Trunk Assessment Upper Extremity Assessment Upper Extremity Assessment: Overall WFL for tasks assessed     Mobility Bed Mobility Bed Mobility: Sit to Sidelying Right Sit to Sidelying Right: 5: Supervision;HOB flat Transfers Transfers: Sit to Stand;Stand to Sit Sit to Stand: 5: Supervision;With upper extremity assist;With armrests;From chair/3-in-1 Stand to Sit: 5: Supervision;With  upper extremity assist;To bed          End of Session OT - End of Session Equipment Utilized During Treatment:  Rolling walker;Back brace Activity Tolerance: Patient tolerated treatment well Patient left: in bed;with call bell/phone within reach;with bed alarm set       Evette Georges [phone] 11/18/2012, 12:33 PM

## 2012-11-18 NOTE — Progress Notes (Signed)
Doing well.up only few times yesterday , no HA  Temp:  [98.3 F (36.8 C)-99.9 F (37.7 C)] 98.9 F (37.2 C) (11/02 0700) Pulse Rate:  [83-94] 84 (11/02 0700) Resp:  [16-18] 18 (11/02 0700) BP: (120-143)/(64-83) 132/80 mmHg (11/02 0700) SpO2:  [93 %-97 %] 97 % (11/02 0700) Good strength and sensation Incision slight pink around sutures  -   Plan: CPM  - increase activity

## 2012-11-19 LAB — GLUCOSE, CAPILLARY
Glucose-Capillary: 169 mg/dL — ABNORMAL HIGH (ref 70–99)
Glucose-Capillary: 152 mg/dL — ABNORMAL HIGH (ref 70–99)

## 2012-11-19 MED ORDER — CYCLOBENZAPRINE HCL 10 MG PO TABS: 10.0000 mg | ORAL_TABLET | Freq: Three times a day (TID) | ORAL | Status: DC | PRN

## 2012-11-19 MED ORDER — OXYCODONE-ACETAMINOPHEN 5-325 MG PO TABS: 1.0000 | ORAL_TABLET | Freq: Four times a day (QID) | ORAL | Status: DC | PRN

## 2012-11-19 NOTE — Care Management Note (Signed)
    Page 1 of 2   11/19/2012     7:18:41 PM   CARE MANAGEMENT NOTE 11/19/2012  Patient:  Chad Yu, Chad Yu   Account Number:  [id number]  Date Initiated:  11/15/2012  Documentation initiated by:  Jiles Crocker  Subjective/Objective Assessment:   ADMITTED FOR SURGERY - LUMBAR DECOMPRESSION     Action/Plan:   CM FOLLOWING FOR DCP   Anticipated DC Date:  11/20/2012   Anticipated DC Plan:  HOME W HOME HEALTH SERVICES      DC Planning Services  CM consult      [facility] Choice  HOME HEALTH   Choice offered to / List presented to:  C-1 Patient        HH arranged  HH-2 PT      [facility] agency  [facility]   Status of service:  In process, will continue to follow Medicare Important Message given?  NA - LOS <3 / Initial given by admissions (If response is "NO", the following Medicare IM given date fields will be blank) Date Medicare IM given:   Date Additional Medicare IM given:    Discharge Disposition:  HOME W HOME HEALTH SERVICES  Per UR Regulation:  Reviewed for med. necessity/level of care/duration of stay  Comments:  11-19-12 1900 L. Floyce Stakes [facility] [phone]- On call CM received a call from [facility] the RN taking care of patient. She stated that patient had a discharge for this pm and needs Home PT starting tomorrow on 11-20-12 Tuesday.  CM spoke to patient via phone and [facility] choices reviewed with patient and patient chose to use [facility].  CM called [phone] and spoke to [facility] and gave patient's information to her regarding referral for Home PT. Demographics reviewed and correct address written on patient's face sheet and this face sheet and order faxed to [facility] attn: [place] fax: [phone]. Patient and patient's wife in agreement with plan.   10/30/2014Abelino Derrick RN,BSN,MHA [phone]

## 2012-11-19 NOTE — Progress Notes (Signed)
Physical Therapy Treatment Patient Details Name: Chad Leasure Sr. MRN: [id number] DOB: 24-Aug-1950 Today's Date: 11/19/2012 Time: 4696-2952 PT Time Calculation (min): 15 min  PT Assessment / Plan / Recommendation  History of Present Illness Patient is a 62 yo male s/p L2-3 PLIF.  Patient with dural tear and on bedrest until 11/16/12 at 9:00 pm.   PT Comments   Pt cont's to make good progress with mobility & gait.  Demonstrated proper technique for bed mobility without needing any cues.     Follow Up Recommendations  Home health PT;Supervision/Assistance - 24 hour     Does the patient have the potential to tolerate intense rehabilitation     Barriers to Discharge        Equipment Recommendations  3in1 (PT)    Recommendations for Other Services    Frequency Min 5X/week   Progress towards PT Goals Progress towards PT goals: Progressing toward goals  Plan Current plan remains appropriate    Precautions / Restrictions Precautions Precautions: Back Precaution Comments: Patient able to recall 3/3 back precautions.  Reviewed with patient. Required Braces or Orthoses: Spinal Brace Spinal Brace: Lumbar corset;Applied in sitting position Restrictions Weight Bearing Restrictions: No       Mobility  Bed Mobility Bed Mobility: Rolling Right;Right Sidelying to Sit;Sitting - Scoot to Edge of Bed;Sit to Sidelying Right Rolling Right: 6: Modified independent (Device/Increase time) Right Sidelying to Sit: 6: Modified independent (Device/Increase time);HOB flat Sitting - Scoot to Edge of Bed: 6: Modified independent (Device/Increase time) Sit to Sidelying Right: 6: Modified independent (Device/Increase time);HOB flat Transfers Transfers: Stand to Sit Stand to Sit: 5: Supervision;To bed Details for Transfer Assistance: cues to reach back for seated surface Ambulation/Gait Ambulation/Gait Assistance: 5: Supervision Ambulation Distance (Feet): 300 Feet Assistive device: Rolling  walker Ambulation/Gait Assistance Details: cues for more upright posture & to relax UE's.   Gait Pattern: Step-through pattern;Trunk flexed Stairs: No      PT Goals (current goals can now be found in the care plan section) Acute Rehab PT Goals PT Goal Formulation: With patient Time For Goal Achievement: 11/24/12 Potential to Achieve Goals: Good  Visit Information  Last PT Received On: 11/19/12 Assistance Needed: +1 History of Present Illness: Patient is a 62 yo male s/p L2-3 PLIF.  Patient with dural tear and on bedrest until 11/16/12 at 9:00 pm.    Subjective Data      Cognition  Cognition Arousal/Alertness: Awake/alert Behavior During Therapy: WFL for tasks assessed/performed Overall Cognitive Status: Within Functional Limits for tasks assessed    Balance     End of Session PT - End of Session Equipment Utilized During Treatment: Back brace Activity Tolerance: Patient tolerated treatment well Patient left: in bed;with call bell/phone within reach Nurse Communication: Mobility status   GP     Lara Mulch 11/19/2012, 2:24 PM   Verdell Face, PTA [phone] 11/19/2012

## 2012-11-19 NOTE — Discharge Summary (Signed)
Physician Discharge Summary  Patient ID: Chad Baller Sr. MRN: [id number] DOB/AGE: 1950/03/23 62 y.o.  Admit date: 11/14/2012 Discharge date: 11/19/2012  Admission Diagnoses:Lumbar spondylolisthesis, L3/4,4/5 Lumbar stenosis Lumbar spondylosis without myelopathy  Discharge Diagnoses:Diagnoses:Lumbar spondylolisthesis, L3/4,4/5 Lumbar stenosis Lumbar spondylosis without myelopathy   Active Problems:   * No active hospital problems. *   Discharged Condition: good  Hospital Course: [name] was taken to the operating room where he underwent a lumbar fusion and decompression. Post op after a period of bedrest for a csf leak, he did very well. His wound is clean, dry, and without leak or infection. He is moving all extremities well. He is voiding, tolerating a regular diet, and ambulating well.  Consults: None  Significant Diagnostic Studies: none Treatments: surgery: POSTERIOR LUMBAR INTERBODYFUSION LUMBAR THREE-FOUR, 11mm nuvasive peek interbodies, morselized local autograft  Posterolateral arthrodesis L3/4,4/5  Lumbar decompression beyond what was necessary for a Plif at L3/4, complete L3 inferior Facetectomies, decompression of the lateral recesses  Segmental nuvasive medial to lateral pedicle screw fixation   Discharge Exam: Blood pressure 158/78, pulse 86, temperature 98.3 F (36.8 C), temperature source Oral, resp. rate 18, height 5\' 11"  (1.803 m), weight 108.892 kg (240 lb 1 oz), SpO2 98.00%. as above  Disposition: 01-Home or Self Care     Medication List         ARTHRITIS STRENGTH BC POWDER PO  Take 2 packets by mouth every morning. Patient may take 2 additional packets in the evening if still in pain.     clopidogrel 75 MG tablet  Commonly known as:  PLAVIX  Take 75 mg by mouth daily.     cyclobenzaprine 10 MG tablet  Commonly known as:  FLEXERIL  Take 1 tablet (10 mg total) by mouth 3 (three) times daily as needed for muscle spasms.     glimepiride  4 MG tablet  Commonly known as:  AMARYL  Take 8 mg by mouth daily before breakfast.     metFORMIN 500 MG tablet  Commonly known as:  GLUCOPHAGE  Take 1,000-1,500 mg by mouth 2 (two) times daily with a meal. 3 tablets in the morning, 2 tablets in the evening     nitroGLYCERIN 0.4 MG SL tablet  Commonly known as:  NITROSTAT  Place 1 tablet (0.4 mg total) under the tongue every 5 (five) minutes as needed.     omega-3 acid ethyl esters 1 G capsule  Commonly known as:  LOVAZA  Take 2 g by mouth 2 (two) times daily.     oxyCODONE-acetaminophen 5-325 MG per tablet  Commonly known as:  PERCOCET/ROXICET  Take 1-2 tablets by mouth every 6 (six) hours as needed for pain.           Follow-up Information   Follow up with Creed Kail L, MD In 1 week. (call to make appointment for suture removal)    Specialty:  Neurosurgery   Contact information:   [address]                         [address] [place] [address] [phone]       Signed: Harumi Yamin L 11/19/2012, 6:26 PM

## 2013-05-13 ENCOUNTER — Encounter: Payer: Self-pay | Admitting: Internal Medicine

## 2013-07-25 ENCOUNTER — Other Ambulatory Visit: Payer: Self-pay

## 2013-07-25 MED ORDER — CLOPIDOGREL BISULFATE 75 MG PO TABS: 75.0000 mg | ORAL_TABLET | Freq: Every day | ORAL | Status: DC

## 2013-09-12 ENCOUNTER — Ambulatory Visit (INDEPENDENT_AMBULATORY_CARE_PROVIDER_SITE_OTHER): Payer: 59 | Admitting: Cardiovascular Disease

## 2013-09-12 ENCOUNTER — Encounter: Payer: Self-pay | Admitting: Cardiovascular Disease

## 2013-09-12 VITALS — BP 150/80 | HR 56 | Ht 72.0 in | Wt 240.0 lb

## 2013-09-12 DIAGNOSIS — I10 Essential (primary) hypertension: Secondary | ICD-10-CM

## 2013-09-12 DIAGNOSIS — E785 Hyperlipidemia, unspecified: Secondary | ICD-10-CM

## 2013-09-12 DIAGNOSIS — I2510 Atherosclerotic heart disease of native coronary artery without angina pectoris: Secondary | ICD-10-CM

## 2013-09-12 MED ORDER — CLOPIDOGREL BISULFATE 75 MG PO TABS: 75.0000 mg | ORAL_TABLET | Freq: Every day | ORAL | Status: DC

## 2013-09-12 NOTE — Patient Instructions (Signed)
Your physician wants you to follow-up in:  12 months.  You will receive a reminder letter in the mail two months in advance. If you don't receive a letter, please call our office to schedule the follow-up appointment.   

## 2013-09-12 NOTE — Progress Notes (Signed)
History of Present Illness: 63 yo male with history of DM, HTN, HLD, CAD here today for cardiac follow up. He has been followed by Dr. Verl Blalock. Last cath in August 2012 per Dr. [name]. The previously stented Diagonal branch had a patent Taxus stent but there was a sub-total occlusion prior to the stent in the diagonal branch that was unable to be opened. He has done well with medical management since then. He is intolerant to statins.   He is here today for follow up. He has been feeling well. No chest pain or SOB. He is very active and busy at work. No LE edema.   Primary Care Physician: Florina Ou  Last Lipid Profile: Followed in primary care.    Past Medical History  Diagnosis Date  . Coronary artery disease   . Diabetes mellitus   . Hyperlipidemia   . Hypertension   . Degenerative joint disease   . Peripheral vascular disease     Past Surgical History  Procedure Laterality Date  . Right knee replacement  in 2005    . Post varicose surgery-1995    . Left rotator cuff repair    . Coronary angioplasty with stent placement    . Tonsillectomy    . Appendectomy    . Joint replacement    . Colonoscopy  2012    Current Outpatient Prescriptions  Medication Sig Dispense Refill  . Aspirin-Salicylamide-Caffeine (ARTHRITIS STRENGTH BC POWDER PO) Take 2 packets by mouth every morning. Patient may take 2 additional packets in the evening if still in pain.      Marland Kitchen clopidogrel (PLAVIX) 75 MG tablet Take 1 tablet (75 mg total) by mouth daily.  90 tablet  0  . cyclobenzaprine (FLEXERIL) 10 MG tablet Take 10 mg by mouth as needed for muscle spasms.      Marland Kitchen glimepiride (AMARYL) 4 MG tablet Take 8 mg by mouth daily before breakfast.      . insulin glargine (LANTUS) 100 UNIT/ML injection Inject into the skin at bedtime.      . metFORMIN (GLUCOPHAGE) 500 MG tablet Take 1,000-1,500 mg by mouth 2 (two) times daily with a meal. 3 tablets in the morning, 2 tablets in the evening      .  nitroGLYCERIN (NITROSTAT) 0.4 MG SL tablet Place 1 tablet (0.4 mg total) under the tongue every 5 (five) minutes as needed.  25 tablet  3  . Omega-3 Fatty Acids (FISH OIL) 1000 MG CAPS Take by mouth.      . [DISCONTINUED] fenofibrate (TRICOR) 145 MG tablet Take 145 mg by mouth daily.         No current facility-administered medications for this visit.    Allergies  Allergen Reactions  . Atorvastatin     REACTION: myalgies  . Lisinopril Other (See Comments)    Hair ball in throat  . Rosuvastatin Itching    History   Social History  . Marital Status: Married    Spouse Name: N/A    Number of Children: 4  . Years of Education: N/A   Occupational History  . 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 Smoking status: Never Smoker   . Smokeless tobacco: Never Used  . Alcohol Use: No  . Drug Use: No  . Sexual Activity: Not on file   Other Topics Concern  . Not on file   Social History Narrative   The patient lives in [place] with his wife. He works  in the Maintenance Department. He denies tobacco or alcohol abuse . He denies regular exercise.    Family History  Problem Relation Age of Onset  . Heart attack Father   . Heart attack Brother     Review of Systems:  As stated in the HPI and otherwise negative.   BP 150/80  Pulse 56  Ht 6' (1.829 m)  Wt 240 lb (108.863 kg)  BMI 32.54 kg/m2  Physical Examination: General: Well developed, well nourished, NAD HEENT: OP clear, mucus membranes moist SKIN: warm, dry. No rashes. Neuro: No focal deficits Musculoskeletal: Muscle strength 5/5 all ext Psychiatric: Mood and affect normal Neck: No JVD, no carotid bruits, no thyromegaly, no lymphadenopathy. Lungs:Clear bilaterally, no wheezes, rhonci, crackles Cardiovascular: Regular rate and rhythm. No murmurs, gallops or rubs. Abdomen:Soft. Bowel sounds present. Non-tender.  Extremities: No lower extremity edema. Pulses are 2 + in the  bilateral DP/PT.  EKG: Sinus brady, rate 58 bpm.   Cardiac cath 08/26/10: 1. Right coronary artery: The right coronary artery was a small,  nondominant vessel.  2. Left main: The left main was short with no angiographic coronary  artery disease.  3. Left circumflex: The left circumflex system was dominant. There  were luminal irregularities. There was a left-sided PDA as well as a  large PLOM.  4. LAD system: The LAD itself had luminal irregularities. There was  a moderate-sized first diagonal. The first diagonal had a drug-  eluting stent about midway down the vessel. This was patent,  however, there was a 99% stenosis just prior to the stent with TIMI  2 flow down the rest of the vessel. The stenosis extended almost  back, but not quite back to the ostium.   Assessment and Plan:   1. CAD: Stable. No chest pains. He takes Plavix daily. He does not tolerate statins. He is not on a beta blocker due to bradycardia. He has had a reaction to Lisinopril. Will consider stress test at f/u visit in one year.   2. HTN: BP elevated today. He refuses to consider adding a medication  3. HLD: Followed in primary care. Intolerant of statins.

## 2013-10-29 ENCOUNTER — Ambulatory Visit (INDEPENDENT_AMBULATORY_CARE_PROVIDER_SITE_OTHER): Payer: 59

## 2013-10-29 ENCOUNTER — Other Ambulatory Visit: Payer: Self-pay | Admitting: Neurosurgery

## 2013-10-29 DIAGNOSIS — M4310 Spondylolisthesis, site unspecified: Secondary | ICD-10-CM

## 2013-10-29 DIAGNOSIS — M4317 Spondylolisthesis, lumbosacral region: Secondary | ICD-10-CM

## 2014-03-21 ENCOUNTER — Encounter: Payer: Self-pay | Admitting: Internal Medicine

## 2014-08-08 ENCOUNTER — Encounter: Payer: 59 | Attending: Internal Medicine | Admitting: *Deleted

## 2014-08-08 ENCOUNTER — Encounter: Payer: Self-pay | Admitting: *Deleted

## 2014-08-08 VITALS — Ht 72.0 in | Wt 239.0 lb

## 2014-08-08 DIAGNOSIS — Z794 Long term (current) use of insulin: Secondary | ICD-10-CM | POA: Insufficient documentation

## 2014-08-08 DIAGNOSIS — Z713 Dietary counseling and surveillance: Secondary | ICD-10-CM | POA: Diagnosis not present

## 2014-08-08 DIAGNOSIS — E118 Type 2 diabetes mellitus with unspecified complications: Secondary | ICD-10-CM | POA: Insufficient documentation

## 2014-08-08 DIAGNOSIS — IMO0002 Reserved for concepts with insufficient information to code with codable children: Secondary | ICD-10-CM

## 2014-08-08 DIAGNOSIS — E1165 Type 2 diabetes mellitus with hyperglycemia: Secondary | ICD-10-CM

## 2014-08-08 NOTE — Patient Instructions (Signed)
Plan:  Aim for 4 Carb Choices per meal (60 grams) +/- 1 either way  Aim for 0-2 Carbs per snack if hungry  Include protein in moderation with your meals and snacks Consider reading food labels for Total Carbohydrate of foods Continue with your activity level daily as tolerated Continue checking BG at alternate times per day as directed by MD  Continue taking diabetes medication as directed by MD

## 2014-08-15 NOTE — Progress Notes (Signed)
Diabetes Self-Management Education  Visit Type: First/Initial  Appt. Start Time: 1000 Appt. End Time: 1130  08/15/2014  Mr. Chad Yu, identified by name and date of birth, is a 64 y.o. male with a diagnosis of Diabetes: Type 2 (had a long time).  Other people present during visit:  Patient   ASSESSMENT  Height 6' (1.829 m), weight 239 lb (108.41 kg). Body mass index is 32.41 kg/(m^2).  Initial Visit Information:  Are you currently following a meal plan?: No   Are you taking your medications as prescribed?: Yes Are you checking your feet?: Yes How many days per week are you checking your feet?: 7 How often do you need to have someone help you when you read instructions, pamphlets, or other written materials from your doctor or pharmacy?: 1 - Never    Psychosocial:     Patient Belief/Attitude about Diabetes: Motivated to manage diabetes Self-care barriers: None Other persons present: Patient Patient Concerns: Nutrition/Meal planning Special Needs: None Preferred Learning Style: Auditory, Hands on Learning Readiness: Ready  Complications:   Last HgB A1C per patient/outside source: 9.2 % How often do you check your blood sugar?: 1-2 times/day Fasting Blood glucose range (mg/dL): 130-179, 70-129 Postprandial Blood glucose range (mg/dL): 130-179, 180-200 Number of hypoglycemic episodes per month: 0 Have you had a dilated eye exam in the past 12 months?: No Have you had a dental exam in the past 12 months?: Yes  Diet Intake:  Breakfast: skips occasionally - egg sandwich OR bacon and eggs if planning to mow that day OR pancakes OR  Lunch: chinese with  meat and vegetables, no rice OR chef's salad OR cheese burger with fries, unsweet tea Dinner: tuna or ham sandwich with baked chips OR 4 hot dogs on rye bread, water Snack (evening): not usually Beverage(s): water, coffee, unsweet tea  Exercise:  Exercise: Light (walking / raking leaves) (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  Patient education plan per assessed needs and concerns is to attend individual sessions for     Education Topics Reviewed with Patient Today:  Definition of diabetes, type 1 and 2, and the diagnosis of diabetes Role of diet in the treatment of diabetes and the relationship between the three main macronutrients and blood glucose level, Food label reading, portion sizes and measuring food., Carbohydrate counting Role of exercise on diabetes management, blood pressure control and cardiac health. Reviewed patients medication for diabetes, action, purpose, timing of dose and side effects. Identified appropriate SMBG and/or A1C goals. Taught treatment of hypoglycemia - the 15 rule. 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) Medications: take my medication as prescribed Monitoring : test blood glucose pre and post meals as discussed Reducing Risk: treat hypoglycemia with 15 grams of carbs if blood glucose less than 70mg /dL (carry Glucose Tabs )  Plan:   Patient Instructions  Plan:  Aim for 4 Carb Choices per meal (60 grams) +/- 1 either way  Aim for 0-2 Carbs per snack if hungry  Include protein in moderation with your meals and snacks Consider  reading food labels for Total Carbohydrate of foods Continue with your activity level daily as tolerated Continue checking BG at alternate times per day as directed by MD  Continue taking diabetes medication as directed by MD      Expected Outcomes:   Demonstrated interest in learning. Expect positive outcomes  Education material provided: Living Well with Diabetes, A1C conversion sheet, Meal plan card and Carbohydrate counting sheet  If problems or questions, patient to contact team via:  Phone and Email  Future DSME appointment: PRN

## 2014-12-24 ENCOUNTER — Encounter: Payer: Self-pay | Admitting: Gastroenterology

## 2015-01-07 ENCOUNTER — Encounter: Payer: Self-pay | Admitting: Cardiovascular Disease

## 2015-01-22 ENCOUNTER — Encounter: Payer: Self-pay | Admitting: Cardiology

## 2015-01-22 ENCOUNTER — Telehealth (HOSPITAL_COMMUNITY): Payer: Self-pay | Admitting: *Deleted

## 2015-01-22 ENCOUNTER — Ambulatory Visit (INDEPENDENT_AMBULATORY_CARE_PROVIDER_SITE_OTHER): Payer: Medicare Other | Admitting: Cardiology

## 2015-01-22 VITALS — BP 180/90 | HR 60 | Ht 72.0 in | Wt 250.4 lb

## 2015-01-22 DIAGNOSIS — R0789 Other chest pain: Secondary | ICD-10-CM | POA: Diagnosis not present

## 2015-01-22 MED ORDER — CLOPIDOGREL BISULFATE 75 MG PO TABS: 75.0000 mg | ORAL_TABLET | Freq: Every day | ORAL | Status: DC

## 2015-01-22 NOTE — Progress Notes (Signed)
01/22/2015 Chad Stack Sr.   [birth year]  [id number]  Primary Physician [name], MD Primary Cardiologist: Dr. [name]   Reason for Visit/CC: Routine F/u for CAD  HPI:  65 yo male with history of DM, HTN, HLD, CAD here today for cardiac follow up. He was followed by Dr. Verl Blalock in the past but is now followed by Dr. [name]. Last cath was in August 2012, performed by Dr. [name]. The previously stented diagonal branch had a patent Taxus stent but there was a sub-total occlusion prior to the stent in the diagonal branch that was unable to be opened. He was placed on medical therapy and did well. He is intolerant to statins.  He has also had reaction to lisinopril in the past. He has not been on a BB due to baseline bradycardia.   He is here today for follow up. It has been over a year since his last OV. He was last seen by Dr. [name] 09/12/13. At that time, he was felt to be stable from a cardiac standpoint. Today, he reports that he has been feeling fairly well. He states that his PCP recently started him on a PCSK9 (Praluent) 6 months ago and he has seen significant reduction in LDL. He is tolerating this well. He does report a 2-3 week history of intermittnet subseternal chest discomfort. Feels like chest pressure. Similar to his previous angina but occurs with no particular pattern. It can occur at rest but not worsened by exertion. No relationship with meals. Does not radiate. Not associated with any other symptoms. Resolves spontaneously. He is currently CP free. His EKG shows NSR with a HR of 60 bpm and no ischemia. His BP is elevated today at 180/90 but he states he has a cold and has been using OTC cold and flu medications for the last several days. He notes that it is usually well controlled at home.     Current Outpatient Prescriptions  Medication Sig Dispense Refill  . Aspirin-Salicylamide-Caffeine (ARTHRITIS STRENGTH BC POWDER PO) Take 2 packets by mouth every morning. Patient may  take 2 additional packets in the evening if still in pain.    Marland Kitchen clopidogrel (PLAVIX) 75 MG tablet Take 1 tablet (75 mg total) by mouth daily. 30 tablet 11  . insulin glargine (LANTUS) 100 UNIT/ML injection Inject into the skin at bedtime.    . nitroGLYCERIN (NITROSTAT) 0.4 MG SL tablet Place 0.4 mg under the tongue every 5 (five) minutes as needed for chest pain (x 3 doses).    . Omega-3 Fatty Acids (FISH OIL) 1000 MG CAPS Take by mouth.    . quiNINE (QUALAQUIN) 324 MG capsule Take 1 capsule by mouth daily.  2  . [DISCONTINUED] fenofibrate (TRICOR) 145 MG tablet Take 145 mg by mouth daily.       No current facility-administered medications for this visit.    Allergies  Allergen Reactions  . Atorvastatin     REACTION: myalgies  . Lisinopril Other (See Comments)    Hair ball in throat  . Rosuvastatin Itching  . Statins Other (See Comments) and Hives    Other reaction(s): Cough (ALLERGY/intolerance), Myalgias (intolerance) No cholesterol meds    Social History   Social History  . Marital Status: Married    Spouse Name: N/A  . Number of Children: 4  . Years of Education: N/A   Occupational History  . 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 Smoking status: Never Smoker   . Smokeless tobacco:  Never Used  . Alcohol Use: No  . Drug Use: No  . Sexual Activity: Not on file   Other Topics Concern  . Not on file   Social History Narrative   The patient lives in [place] with his wife. He works in Leisure centre manager. He denies tobacco or alcohol abuse . He denies regular exercise.     Review of Systems: General: negative for chills, fever, night sweats or weight changes.  Cardiovascular: negative for chest pain, dyspnea on exertion, edema, orthopnea, palpitations, paroxysmal nocturnal dyspnea or shortness of breath Dermatological: negative for rash Respiratory: negative for cough or wheezing Urologic: negative for  hematuria Abdominal: negative for nausea, vomiting, diarrhea, bright red blood per rectum, melena, or hematemesis Neurologic: negative for visual changes, syncope, or dizziness All other systems reviewed and are otherwise negative except as noted above.    Blood pressure 180/90, pulse 60, height 6' (1.829 m), weight 250 lb 6.4 oz (113.581 kg).  General appearance: alert, cooperative and no distress Neck: no carotid bruit and no JVD Lungs: clear to auscultation bilaterally Heart: regular rate and rhythm, S1, S2 normal, no murmur, click, rub or gallop Extremities: no LEE Pulses: 2+ and symmetric Skin: Skin color, texture, turgor normal. No rashes or lesions Neurologic: Alert and oriented X 3, normal strength and tone. Normal symmetric reflexes. Normal coordination and gait  EKG NSR. 60 bmp. No ischemia.   ASSESSMENT AND PLAN:   1. CAD: s/p prior stenting to the circumflex. Last cath in August 2012 per Dr. [name]. The previously stented Diagonal branch had a patent Taxus stent but there was a sub-total occlusion prior to the stent in the diagonal branch that was unable to be opened. He has been on medical therapy. Now with 2-3 week h/o CP with mixed typical + atypical features. Will order a Lexiscan Myoview to risk stratify.  Continue medical therapy with Plavix and PCSK9. He is not on a beta blocker due to bradycardia. He has had a reaction to Lisinopril.  2. HTN: BP is high today but this may be from frequent use of OTC cold and flu medications. Patient instructed to monitor pressures at home, once he recovers from his cold, and to notify our office if levels remain high. He has been intolerant to lisinopril and we will need to avoid use of BB due to slow resting HR. Can consider adding amlodipine or Imdur if needed.   3. HLD: followed by PCP. Intolerant to statins. Now on PCSK9 for the past 6 months and he notes significant reduction in LDL. We will attempt to obtain lipid profile from his  PCP to place in our records.   4. DM: followed by PCP. On insulin .   PLAN  F/u with Dr. [name] in 6 months. Plavix refill order placed. Rx written for PRN SL NTG.   [name] PA-C 01/22/2015 9:30 AM

## 2015-01-22 NOTE — Patient Instructions (Addendum)
Medication Instructions:  Your physician recommends that you continue on your current medications as directed. Please refer to the Current Medication list given to you today.  Labwork: NONE  Testing/Procedures: Your physician has requested that you have a lexiscan myoview. For further information please visit [email or web address]. Please follow instruction sheet, as given.  Follow-Up: Your physician wants you to follow-up in: 6 months with Dr. [name]. You will receive a reminder letter in the mail two months in advance. If you don't receive a letter, please call our office to schedule the follow-up appointment.    If you need a refill on your cardiac medications before your next appointment, please call your pharmacy.

## 2015-01-22 NOTE — Telephone Encounter (Signed)
Patient given detailed instructions per Myocardial Perfusion Study Information Sheet for the test on 01/27/15 at 745. Patient notified to arrive 15 minutes early and that it is imperative to arrive on time for appointment to keep from having the test rescheduled.  If you need to cancel or reschedule your appointment, please call the office within 24 hours of your appointment. Failure to do so may result in a cancellation of your appointment, and a $50 no show fee. Patient verbalized understanding.Hubbard Robinson, RN

## 2015-01-26 ENCOUNTER — Encounter: Payer: Self-pay | Admitting: Physician Assistant

## 2015-01-26 ENCOUNTER — Encounter: Payer: Self-pay | Admitting: Cardiology

## 2015-01-27 ENCOUNTER — Ambulatory Visit (HOSPITAL_COMMUNITY): Payer: Medicare Other | Attending: Cardiology

## 2015-01-27 DIAGNOSIS — R0789 Other chest pain: Secondary | ICD-10-CM | POA: Diagnosis not present

## 2015-01-27 DIAGNOSIS — I10 Essential (primary) hypertension: Secondary | ICD-10-CM | POA: Diagnosis not present

## 2015-01-27 DIAGNOSIS — E119 Type 2 diabetes mellitus without complications: Secondary | ICD-10-CM | POA: Insufficient documentation

## 2015-01-27 LAB — MYOCARDIAL PERFUSION IMAGING
CHL CUP RESTING HR STRESS: 60 {beats}/min
LV dias vol: 114 mL
LVSYSVOL: 43 mL
Peak HR: 89 {beats}/min
RATE: 0.28
SDS: 1
SRS: 1
SSS: 2
TID: 0.92

## 2015-01-27 MED ORDER — REGADENOSON 0.4 MG/5ML IV SOLN
0.4000 mg | Freq: Once | INTRAVENOUS | Status: AC
  Administered 2015-01-27: 0.4 mg via INTRAVENOUS

## 2015-01-27 MED ORDER — TECHNETIUM TC 99M SESTAMIBI GENERIC - CARDIOLITE
31.5000 | Freq: Once | INTRAVENOUS | Status: AC | PRN
  Administered 2015-01-27: 32 via INTRAVENOUS

## 2015-01-27 MED ORDER — TECHNETIUM TC 99M SESTAMIBI GENERIC - CARDIOLITE
10.2000 | Freq: Once | INTRAVENOUS | Status: AC | PRN
  Administered 2015-01-27: 10 via INTRAVENOUS

## 2015-12-07 ENCOUNTER — Telehealth: Payer: Self-pay | Admitting: Cardiovascular Disease

## 2015-12-07 NOTE — Telephone Encounter (Signed)
I spoke with pt and made appointment for him to see B. Rosita Fire, [place] on 12/09/15 at 8:30

## 2015-12-07 NOTE — Telephone Encounter (Signed)
°  New Prob   Request for surgical clearance:  1. What type of surgery is being performed? L knee replacement    2. When is this surgery scheduled? Not scheduled yet   3. Are there any medications that need to be held prior to surgery and how long? No   4. Name of physician performing surgery? Dr. Amada Jupiter   5. What is your office phone and fax number? [place] [phone]   Pt is unsure if he needs to come in for an appointment for clearance or if something can be faxed over to office. Please call.

## 2015-12-09 ENCOUNTER — Ambulatory Visit (INDEPENDENT_AMBULATORY_CARE_PROVIDER_SITE_OTHER): Payer: Medicare Other | Admitting: Cardiology

## 2015-12-09 ENCOUNTER — Telehealth: Payer: Self-pay

## 2015-12-09 ENCOUNTER — Encounter (INDEPENDENT_AMBULATORY_CARE_PROVIDER_SITE_OTHER): Payer: Self-pay

## 2015-12-09 ENCOUNTER — Encounter: Payer: Self-pay | Admitting: Cardiology

## 2015-12-09 VITALS — BP 140/62 | HR 70 | Ht 72.0 in | Wt 245.8 lb

## 2015-12-09 DIAGNOSIS — Z01810 Encounter for preprocedural cardiovascular examination: Secondary | ICD-10-CM | POA: Diagnosis not present

## 2015-12-09 NOTE — Patient Instructions (Signed)
Medication Instructions:  Your physician recommends that you continue on your current medications as directed. Please refer to the Current Medication list given to you today.   Labwork: None ordered  Testing/Procedures: None ordered  Follow-Up: Your physician wants you to follow-up in: 6 months with Dr.Mcalhany You will receive a reminder letter in the mail two months in advance. If you don't receive a letter, please call our office to schedule the follow-up appointment.   Any Other Special Instructions Will Be Listed Below (If Applicable). You have been  Cleared for your upcoming surgery with Dr.Murphy     If you need a refill on your cardiac medications before your next appointment, please call your pharmacy.

## 2015-12-09 NOTE — Telephone Encounter (Signed)
Cardiac clearance placed in MR nurse fax box to be faxed to [place]

## 2015-12-09 NOTE — Progress Notes (Signed)
12/09/2015 Chad Stack Sr.   10-28-1950  [id number]  Primary Physician Chad Mariscal, MD Primary Cardiologist: Dr. Angelena Yu   Reason for Visit/CC: Surgical Clearance   HPI:  65 yo male with history of DM, HTN, HLD and CAD here today for cardiac follow up. He was followed by Dr. Verl Yu in the past but is now followed by Dr. Angelena Yu. Last cath was in August 2012, performed by Dr. Lia Yu. The previously stented diagonal branch had a patent Taxus stent but there was a sub-total occlusion prior to the stent in the diagonal branch that was unable to be opened. He was placed on medical therapy and did well. He is intolerant to statins, but has had success with PCSK9 (Praluent). His PCP follows his lipid profile. He has also had reaction to lisinopril in the past. He has not been on a BB due to baseline bradycardia.   I evaluated him on 01/22/2015. He presented for routine f/u and had noted 2-3 week h/o CP with mixed typical + atypical features. I ordered a [employer]. This was performed on 01/27/15. This was a normal study. There was no ischemia or infarction by perfusion imaging. LV systolic function was normal. EF was estimated at 62%.  He now presents back to clinic for surgical clearance. He is needing to undergo left TKR for OA, with Dr. Amada Yu. He denies any anginal symptomatology. He is able to ambulate a flight of stairs w/o exertional CP or dyspnea. No LEE, orthopnea, PND, palpitations, lightheadedness/ dizziness. EKG shows NRS. No ischemia. Unchanged from prior. VSS. HR and BP both well controlled. He notes she stopped Plavix a while back but takes ASA.    No outpatient prescriptions have been marked as taking for the 12/09/15 encounter (Office Visit) with Chad Pandy, PA-C.   Allergies  Allergen Reactions  . Atorvastatin     REACTION: myalgies  . Lisinopril Other (See Comments)    Hair ball in throat  . Rosuvastatin Itching  . Statins Other (See Comments) and Hives   Other reaction(s): Cough (ALLERGY/intolerance), Myalgias (intolerance) No cholesterol meds   Past Medical History:  Diagnosis Date  . Coronary artery disease   . Degenerative joint disease   . Diabetes mellitus   . Hyperlipidemia   . Hypertension   . Peripheral vascular disease ([place])    Family History  Problem Relation Age of Onset  . Heart attack Father   . Hypertension Father   . Heart attack Brother    Past Surgical History:  Procedure Laterality Date  . APPENDECTOMY    . COLONOSCOPY  2012  . CORONARY ANGIOPLASTY WITH STENT PLACEMENT    . JOINT REPLACEMENT    . left rotator cuff repair    . post varicose surgery-1995    . right knee replacement  in 2005    . TONSILLECTOMY     Social History   Social History  . Marital status: Married    Spouse name: N/A  . Number of children: 4  . Years of education: N/A   Occupational History  . 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 Smoking status: Never Smoker  . Smokeless tobacco: Never Used  . Alcohol use No  . Drug use: No  . Sexual activity: Not on file   Other Topics Concern  . Not on file   Social History Narrative   The patient lives in [place] with his wife. He works in Leisure centre manager. He denies tobacco  or alcohol abuse . He denies regular exercise.     Review of Systems: General: negative for chills, fever, night sweats or weight changes.  Cardiovascular: negative for chest pain, dyspnea on exertion, edema, orthopnea, palpitations, paroxysmal nocturnal dyspnea or shortness of breath Dermatological: negative for rash Respiratory: negative for cough or wheezing Urologic: negative for hematuria Abdominal: negative for nausea, vomiting, diarrhea, bright red blood per rectum, melena, or hematemesis Neurologic: negative for visual changes, syncope, or dizziness All other systems reviewed and are otherwise negative except as noted above.   Physical Exam:    Height 6' (1.829 m), weight 245 lb 12.8 oz (111.5 kg).  General appearance: alert, cooperative and no distress Neck: no carotid bruit and no JVD Lungs: clear to auscultation bilaterally Heart: regular rate and rhythm, S1, S2 normal, no murmur, click, rub or gallop Extremities: extremities normal, atraumatic, no cyanosis or edema Pulses: 2+ and symmetric Skin: Skin color, texture, turgor normal. No rashes or lesions Neurologic: Grossly normal  EKG   ASSESSMENT AND PLAN:   1. CAD: s/p prior stenting to the circumflex. Last cath in August 2012 per Dr. Lia Yu. The previously stented Diagonal branch had a patent Taxus stent but there was a sub-total occlusion prior to the stent in the diagonal branch that was unable to be opened. He has been on medical therapy. Lexiscan Myoview, less than 1 year ago, 01/27/15, was a low risk study w/o ischemia. Normal EF. Continue ASA and PCSK9. He is not on a beta blocker due to bradycardia. He has had a reaction to Lisinopril.  2. HTN: BP controlled on current regimen.   3. HLD: followed by PCP. Intolerant to statins. Now on PCSK9 and he notes significant reduction in LDL. Currently in the donut hole, but hopes to restart first of the year.   4. DM: followed by PCP. On insulin .   5. Pre-operative Assessment: EKG is negative for ischemia. He had a NST less than 1 year ago that showed no ischemia. EF normal. He is able to ambulate a flight of stairs w/o exertional CP or dyspnea. Physical exam is benign. BP and HR both well controlled. He can be cleared from a cardiac standpoint for TKR. No need for additional pre-operative testing. He will be low to moderate risk.   PLAN  F/u with Dr. Angelena Yu in 6 months.   Kellan Raffield PA-C 12/09/2015 8:30 AM

## 2016-02-02 NOTE — H&P (Signed)
TOTAL KNEE ADMISSION H&P  Patient is being admitted for left total knee arthroplasty.  Subjective:  Chief Complaint:left knee pain.  HPI: Chad Eyles Sr., 66 y.o. male, has a history of pain and functional disability in the left knee due to arthritis and has failed non-surgical conservative treatments for greater than 12 weeks to includeNSAID's and/or analgesics and corticosteriod injections.  Onset of symptoms was abrupt, starting 1 years ago with rapidlly worsening course since that time. The patient noted no past surgery on the left knee(s).  Patient currently rates pain in the left knee(s) at 4 out of 10 with activity. Patient has night pain, worsening of pain with activity and weight bearing and crepitus.  Patient has evidence of subchondral sclerosis and joint space narrowing by imaging studies . There is no active infection.  Patient Active Problem List   Diagnosis Date Noted  . Bradycardia 12/16/2010  . DIZZINESS 04/27/2009  . CHEST PAIN 04/27/2009  . ABNORMAL ELECTROCARDIOGRAM 04/27/2009  . ABDOMINAL PAIN-RUQ 10/30/2008  . GASTRITIS 06/13/2008  . GERD 06/05/2008  . SWELLING MASS OR LUMP IN HEAD AND NECK 06/05/2008  . COUGH 06/05/2008  . HEMORRHOIDS 06/04/2008  . CAD, NATIVE VESSEL 04/09/2008  . DM 04/07/2008  . HYPERLIPIDEMIA-MIXED 04/07/2008  . HYPERTENSION, UNSPECIFIED 04/07/2008  . FATIGUE / MALAISE 04/07/2008  . DYSPNEA 04/07/2008   Past Medical History:  Diagnosis Date  . Coronary artery disease   . Degenerative joint disease   . Diabetes mellitus   . Hyperlipidemia   . Hypertension   . Peripheral vascular disease [facility])     Past Surgical History:  Procedure Laterality Date  . APPENDECTOMY    . COLONOSCOPY  2012  . CORONARY ANGIOPLASTY WITH STENT PLACEMENT    . JOINT REPLACEMENT    . left rotator cuff repair    . post varicose surgery-1995    . right knee replacement  in 2005    . TONSILLECTOMY      No prescriptions prior to admission.   Allergies   Allergen Reactions  . Atorvastatin     REACTION: myalgies  . Lisinopril Other (See Comments)    Severe cough Hair ball in throat  . Rosuvastatin Itching  . Statins Other (See Comments) and Hives    Other reaction(s): Cough (ALLERGY/intolerance), Myalgias (intolerance) No cholesterol meds    Social History  Substance Use Topics  . Smoking status: Never Smoker  . Smokeless tobacco: Never Used  . Alcohol use No    Family History  Problem Relation Age of Onset  . Heart attack Father   . Hypertension Father   . Heart attack Brother      Review of Systems  Constitutional: Negative.   HENT: Negative.   Eyes: Negative.   Respiratory: Negative.   Cardiovascular: Negative.   Gastrointestinal: Negative.   Genitourinary: Negative.   Musculoskeletal: Positive for joint pain.  Skin: Negative.   Neurological: Negative.   Endo/Heme/Allergies: Negative.   Psychiatric/Behavioral: Negative.     Objective:  Physical Exam  Constitutional: He is oriented to person, place, and time. He appears well-developed and well-nourished.  HENT:  Head: Normocephalic and atraumatic.  Eyes: EOM are normal. Pupils are equal, round, and reactive to light.  Neck: Normal range of motion. Neck supple.  Cardiovascular: Normal rate and regular rhythm.   Respiratory: Effort normal and breath sounds normal.  GI: Soft. Bowel sounds are normal.  Musculoskeletal:  Antalgic gait with a varus thrust on the left.  Negative log roll of both hips.  Negative straight leg raise, both sides.  The right knee has a well healed incision.  Full extension, 120 degrees of flexion.  The left knee 5 degrees of varus.  Tibiofemoral and patellofemoral crepitus.  Motion 5-110 degrees.  Neurovascularly intact distally.    Neurological: He is alert and oriented to person, place, and time.  Skin: Skin is warm and dry.  Psychiatric: He has a normal mood and affect. His behavior is normal. Judgment and thought content normal.     Vital signs in last 24 hours:    Labs:   Estimated body mass index is 33.34 kg/m as calculated from the following:   Height as of 12/09/15: 6' (1.829 m).   Weight as of 12/09/15: 111.5 kg (245 lb 12.8 oz).   Imaging Review Plain radiographs demonstrate severe degenerative joint disease of the left knee(s). The overall alignment ismild varus. The bone quality appears to be fair for age and reported activity level.  Assessment/Plan:  End stage arthritis, left knee   The patient history, physical examination, clinical judgment of the provider and imaging studies are consistent with end stage degenerative joint disease of the left knee(s) and total knee arthroplasty is deemed medically necessary. The treatment options including medical management, injection therapy arthroscopy and arthroplasty were discussed at length. The risks and benefits of total knee arthroplasty were presented and reviewed. The risks due to aseptic loosening, infection, stiffness, patella tracking problems, thromboembolic complications and other imponderables were discussed. The patient acknowledged the explanation, agreed to proceed with the plan and consent was signed. Patient is being admitted for inpatient treatment for surgery, pain control, PT, OT, prophylactic antibiotics, VTE prophylaxis, progressive ambulation and ADL's and discharge planning. The patient is planning to be discharged home with home health services

## 2016-02-03 ENCOUNTER — Encounter (HOSPITAL_COMMUNITY): Payer: Self-pay

## 2016-02-03 ENCOUNTER — Encounter (HOSPITAL_COMMUNITY)
Admission: RE | Admit: 2016-02-03 | Discharge: 2016-02-03 | Disposition: A | Discharge status: Home / Self Care | Payer: Medicare Other | Source: Ambulatory Visit | Attending: Orthopedic Surgery | Admitting: Orthopedic Surgery

## 2016-02-03 DIAGNOSIS — Z01812 Encounter for preprocedural laboratory examination: Secondary | ICD-10-CM | POA: Diagnosis not present

## 2016-02-03 LAB — TYPE AND SCREEN
ABO/RH(D): O POS
ANTIBODY SCREEN: NEGATIVE

## 2016-02-03 LAB — CBC
HCT: 40.3 % (ref 39.0–52.0)
HEMOGLOBIN: 13.8 g/dL (ref 13.0–17.0)
MCH: 30.6 pg (ref 26.0–34.0)
MCHC: 34.2 g/dL (ref 30.0–36.0)
MCV: 89.4 fL (ref 78.0–100.0)
PLATELETS: 168 10*3/uL (ref 150–400)
RBC: 4.51 MIL/uL (ref 4.22–5.81)
RDW: 13.5 % (ref 11.5–15.5)
WBC: 5.4 10*3/uL (ref 4.0–10.5)

## 2016-02-03 LAB — BASIC METABOLIC PANEL
Anion gap: 7 (ref 5–15)
BUN: 16 mg/dL (ref 6–20)
CALCIUM: 9.4 mg/dL (ref 8.9–10.3)
CO2: 25 mmol/L (ref 22–32)
CREATININE: 0.98 mg/dL (ref 0.61–1.24)
Chloride: 107 mmol/L (ref 101–111)
GFR calc Af Amer: >60 mL/min (ref >60)
GLUCOSE: 115 mg/dL — AB (ref 65–99)
Potassium: 4.1 mmol/L (ref 3.5–5.1)
Sodium: 139 mmol/L (ref 135–145)

## 2016-02-03 LAB — SURGICAL PCR SCREEN
MRSA, PCR: NEGATIVE
STAPHYLOCOCCUS AUREUS: POSITIVE — AB

## 2016-02-03 LAB — GLUCOSE, CAPILLARY: GLUCOSE-CAPILLARY: 116 mg/dL — AB (ref 65–99)

## 2016-02-03 NOTE — Pre-Procedure Instructions (Addendum)
Chad Yu  02/03/2016      [id number] - [place], [place] - [address] [place] [address] Phone: [phone] Fax: [place] [address] [place] - [id number] [place] [place] [address] Phone: [phone] Fax: [phone]    Your procedure is scheduled on Wed. Jan. 31  Report to [facility] Admitting at  6:45 A.M.  Call this number if you have problems the morning of surgery:  [phone]   Remember:  Do not eat food or drink liquids after midnight on Tues. Jan. 30   Take these medicines the morning of surgery with A SIP OF WATER : none             1 Week prior to surgery stop:advil, motrin, ibuprofen, aleve, BC Powders, Goody's , vit. E,,voltaren gel     How to Manage Your Diabetes Before and After Surgery  Why is it important to control my blood sugar before and after surgery? . Improving blood sugar levels before and after surgery helps healing and can limit problems. . A way of improving blood sugar control is eating a healthy diet by: o  Eating less sugar and carbohydrates o  Increasing activity/exercise o  Talking with your doctor about reaching your blood sugar goals . High blood sugars (greater than 180 mg/dL) can raise your risk of infections and slow your recovery, so you will need to focus on controlling your diabetes during the weeks before surgery. . Make sure that the doctor who takes care of your diabetes knows about your planned surgery including the date and location.  How do I manage my blood sugar before surgery? . Check your blood sugar at least 4 times a day, starting 2 days before surgery, to make sure that the level is not too high or low. o Check your blood sugar the morning of your surgery when you wake up and every 2 hours until you get to the Short Stay unit. . If your blood sugar is less than 70 mg/dL, you will need  to treat for low blood sugar: o Do not take insulin. o Treat a low blood sugar (less than 70 mg/dL) with  cup of clear juice (cranberry or apple), 4 glucose tablets, OR glucose gel. o Recheck blood sugar in 15 minutes after treatment (to make sure it is greater than 70 mg/dL). If your blood sugar is not greater than 70 mg/dL on recheck, call [phone] for further instructions. . Report your blood sugar to the short stay nurse when you get to Short Stay.  . If you are admitted to the hospital after surgery: o Your blood sugar will be checked by the staff and you will probably be given insulin after surgery (instead of oral diabetes medicines) to make sure you have good blood sugar levels. o The goal for blood sugar control after surgery is 80-180 mg/dL       WHAT DO I DO ABOUT MY DIABETES MEDICATION?   Marland Kitchen Do not take oral diabetes medicines (pills) the morning of surgery.  . THE NIGHT BEFORE SURGERY, take ______28_____ units of ___lantus________insulin.          Do not wear jewelry.  Do not wear lotions, powders, or perfumes, or deoderant.  Do not shave 48 hours prior to surgery.  Men may shave face and neck.  Do not bring valuables to the hospital.  [facility]  is not responsible for any belongings or valuables.  Contacts, dentures or bridgework may not be worn into surgery.  Leave your suitcase in the car.  After surgery it may be brought to your room.  For patients admitted to the hospital, discharge time will be determined by your treatment team.  Patients discharged the day of surgery will not be allowed to drive home.    Special instructions:   [facility]- Preparing For Surgery  Before surgery, you can play an important role. Because skin is not sterile, your skin needs to be as free of germs as possible. You can reduce the number of germs on your skin by washing with CHG (chlorahexidine gluconate) Soap before surgery.  CHG is an antiseptic cleaner which kills germs  and bonds with the skin to continue killing germs even after washing.  Please do not use if you have an allergy to CHG or antibacterial soaps. If your skin becomes reddened/irritated stop using the CHG.  Do not shave (including legs and underarms) for at least 48 hours prior to first CHG shower. It is OK to shave your face.  Please follow these instructions carefully.   1. Shower the NIGHT BEFORE SURGERY and the MORNING OF SURGERY with CHG.   2. If you chose to wash your hair, wash your hair first as usual with your normal shampoo.  3. After you shampoo, rinse your hair and body thoroughly to remove the shampoo.  4. Use CHG as you would any other liquid soap. You can apply CHG directly to the skin and wash gently with a scrungie or a clean washcloth.   5. Apply the CHG Soap to your body ONLY FROM THE NECK DOWN.  Do not use on open wounds or open sores. Avoid contact with your eyes, ears, mouth and genitals (private parts). Wash genitals (private parts) with your normal soap.  6. Wash thoroughly, paying special attention to the area where your surgery will be performed.  7. Thoroughly rinse your body with warm water from the neck down.  8. DO NOT shower/wash with your normal soap after using and rinsing off the CHG Soap.  9. Pat yourself dry with a CLEAN TOWEL.   10. Wear CLEAN PAJAMAS   11. Place CLEAN SHEETS on your bed the night of your first shower and DO NOT SLEEP WITH PETS.    Day of Surgery: Do not apply any deodorants/lotions. Please wear clean clothes to the hospital/surgery center.      Please read over the following fact sheets that you were given. Coughing and Deep Breathing, Total Joint Packet and MRSA Information

## 2016-02-03 NOTE — Progress Notes (Signed)
   02/03/16 1340  OBSTRUCTIVE SLEEP APNEA  Have you ever been diagnosed with sleep apnea through a sleep study? No  Do you snore loudly (loud enough to be heard through closed doors)?  1  Do you often feel tired, fatigued, or sleepy during the daytime (such as falling asleep during driving or talking to someone)? 0  Has anyone observed you stop breathing during your sleep? 0  Do you have, or are you being treated for high blood pressure? 1  BMI more than 35 kg/m2? 0  Age > 50 (1-yes) 1  Neck circumference greater than:Male 16 inches or larger, Male 17inches or larger? 1  Male Gender (Yes=1) 1  Obstructive Sleep Apnea Score 5  Score 5 or greater  Results sent to PCP

## 2016-02-03 NOTE — Progress Notes (Addendum)
PCP: Dr. Nancy Fetter @ [facility] on [address] in [place]--requested hgb A1C results  Cardiologist : Dr. Estrella Myrtle  Fasting sugars 116-176

## 2016-02-04 NOTE — Progress Notes (Signed)
Mupirocin Ointment Rx called into [facility] on [address] for positive PCR of Staph. Pt notified and voiced understanding.  

## 2016-02-05 ENCOUNTER — Inpatient Hospital Stay (HOSPITAL_COMMUNITY): Admission: RE | Admit: 2016-02-05 | Payer: Medicare Other | Source: Ambulatory Visit

## 2016-02-16 MED ORDER — TRANEXAMIC ACID 1000 MG/10ML IV SOLN
1000.0000 mg | INTRAVENOUS | Status: DC
  Filled 2016-02-16: qty 10

## 2016-02-16 NOTE — Anesthesia Preprocedure Evaluation (Addendum)
Anesthesia Evaluation  Patient identified by MRN, date of birth, ID band Patient awake    Reviewed: Allergy & Precautions, NPO status , Patient's Chart, lab work & pertinent test results  Airway Mallampati: II  TM Distance: >3 FB Neck ROM: Full    Dental   Pulmonary neg pulmonary ROS,    breath sounds clear to auscultation       Cardiovascular hypertension, (-) angina+ CAD, + Cardiac Stents and + Peripheral Vascular Disease   Rhythm:Regular Rate:Normal  01/2015  Nuclear stress EF: 62%.  There was no ST segment deviation noted during stress.  The study is normal.  The left ventricular ejection fraction is normal (55-65%).   1. No ischemia or infarction by perfusion imaging.  2. Normal LV systolic function and wall motion.    Neuro/Psych negative neurological ROS     GI/Hepatic Neg liver ROS, GERD  ,  Endo/Other  diabetes, Type 2, Insulin Dependent  Renal/GU negative Renal ROS     Musculoskeletal  (+) Arthritis ,   Abdominal   Peds  Hematology   Anesthesia Other Findings   Reproductive/Obstetrics                            Lab Results  Component Value Date   WBC 5.4 02/03/2016   HGB 13.8 02/03/2016   HCT 40.3 02/03/2016   MCV 89.4 02/03/2016   PLT 168 02/03/2016   Lab Results  Component Value Date   CREATININE 0.98 02/03/2016   BUN 16 02/03/2016   NA 139 02/03/2016   K 4.1 02/03/2016   CL 107 02/03/2016   CO2 25 02/03/2016    Anesthesia Physical Anesthesia Plan  ASA: III  Anesthesia Plan: Spinal   Post-op Pain Management:  Regional for Post-op pain   Induction: Intravenous  Airway Management Planned: Natural Airway and Simple Face Mask  Additional Equipment:   Intra-op Plan:   Post-operative Plan:   Informed Consent: I have reviewed the patients History and Physical, chart, labs and discussed the procedure including the risks, benefits and alternatives for the  proposed anesthesia with the patient or authorized representative who has indicated his/her understanding and acceptance.     Plan Discussed with:   Anesthesia Plan Comments:        Anesthesia Quick Evaluation

## 2016-02-17 ENCOUNTER — Inpatient Hospital Stay (HOSPITAL_COMMUNITY): Payer: Medicare Other

## 2016-02-17 ENCOUNTER — Encounter (HOSPITAL_COMMUNITY): Payer: Self-pay | Admitting: *Deleted

## 2016-02-17 ENCOUNTER — Inpatient Hospital Stay (HOSPITAL_COMMUNITY)
Admission: RE | Admit: 2016-02-17 | Discharge: 2016-02-19 | DRG: 470 | Disposition: A | Discharge status: Home Health | Payer: Medicare Other | Source: Ambulatory Visit | Attending: Orthopedic Surgery | Admitting: Orthopedic Surgery

## 2016-02-17 ENCOUNTER — Inpatient Hospital Stay (HOSPITAL_COMMUNITY): Payer: Medicare Other | Admitting: Certified Registered"

## 2016-02-17 ENCOUNTER — Encounter (HOSPITAL_COMMUNITY)
Admission: RE | Disposition: A | Discharge status: Home Health | Payer: Self-pay | Source: Ambulatory Visit | Attending: Orthopedic Surgery

## 2016-02-17 DIAGNOSIS — Z955 Presence of coronary angioplasty implant and graft: Secondary | ICD-10-CM | POA: Diagnosis not present

## 2016-02-17 DIAGNOSIS — M24562 Contracture, left knee: Secondary | ICD-10-CM | POA: Diagnosis present

## 2016-02-17 DIAGNOSIS — M21162 Varus deformity, not elsewhere classified, left knee: Secondary | ICD-10-CM | POA: Diagnosis not present

## 2016-02-17 DIAGNOSIS — I2510 Atherosclerotic heart disease of native coronary artery without angina pectoris: Secondary | ICD-10-CM | POA: Diagnosis not present

## 2016-02-17 DIAGNOSIS — Z794 Long term (current) use of insulin: Secondary | ICD-10-CM | POA: Diagnosis not present

## 2016-02-17 DIAGNOSIS — Z96651 Presence of right artificial knee joint: Secondary | ICD-10-CM | POA: Diagnosis present

## 2016-02-17 DIAGNOSIS — I10 Essential (primary) hypertension: Secondary | ICD-10-CM | POA: Diagnosis present

## 2016-02-17 DIAGNOSIS — E1151 Type 2 diabetes mellitus with diabetic peripheral angiopathy without gangrene: Secondary | ICD-10-CM | POA: Diagnosis present

## 2016-02-17 DIAGNOSIS — Z888 Allergy status to other drugs, medicaments and biological substances status: Secondary | ICD-10-CM | POA: Diagnosis not present

## 2016-02-17 DIAGNOSIS — K219 Gastro-esophageal reflux disease without esophagitis: Secondary | ICD-10-CM | POA: Diagnosis not present

## 2016-02-17 DIAGNOSIS — M1712 Unilateral primary osteoarthritis, left knee: Principal | ICD-10-CM | POA: Diagnosis present

## 2016-02-17 DIAGNOSIS — D62 Acute posthemorrhagic anemia: Secondary | ICD-10-CM | POA: Diagnosis not present

## 2016-02-17 DIAGNOSIS — Z96659 Presence of unspecified artificial knee joint: Secondary | ICD-10-CM

## 2016-02-17 HISTORY — PX: TOTAL KNEE ARTHROPLASTY: SHX125

## 2016-02-17 LAB — GLUCOSE, CAPILLARY
GLUCOSE-CAPILLARY: 169 mg/dL — AB (ref 65–99)
GLUCOSE-CAPILLARY: 222 mg/dL — AB (ref 65–99)
GLUCOSE-CAPILLARY: 195 mg/dL — AB (ref 65–99)
Glucose-Capillary: 141 mg/dL — ABNORMAL HIGH (ref 65–99)

## 2016-02-17 SURGERY — ARTHROPLASTY, KNEE, TOTAL
Anesthesia: General | Laterality: Left

## 2016-02-17 MED ORDER — OXYCODONE-ACETAMINOPHEN 5-325 MG PO TABS: 1.0000 | ORAL_TABLET | ORAL | 0 refills | Status: DC | PRN

## 2016-02-17 MED ORDER — ACETAMINOPHEN 325 MG PO TABS
650.0000 mg | ORAL_TABLET | Freq: Four times a day (QID) | ORAL | Status: DC | PRN
  Administered 2016-02-17 – 2016-02-18 (×3): 650 mg via ORAL
  Filled 2016-02-17 (×3): qty 2

## 2016-02-17 MED ORDER — PHENYLEPHRINE HCL 10 MG/ML IJ SOLN
INTRAVENOUS | Status: DC | PRN
  Administered 2016-02-17: 25 ug/min via INTRAVENOUS

## 2016-02-17 MED ORDER — MENTHOL 3 MG MT LOZG: 1.0000 | LOZENGE | OROMUCOSAL | Status: DC | PRN

## 2016-02-17 MED ORDER — PROPOFOL 10 MG/ML IV BOLUS
INTRAVENOUS | Status: DC | PRN
  Administered 2016-02-17: 200 mg via INTRAVENOUS

## 2016-02-17 MED ORDER — APIXABAN 2.5 MG PO TABS: ORAL_TABLET | ORAL | 0 refills | Status: DC

## 2016-02-17 MED ORDER — SODIUM CHLORIDE 0.9 % IR SOLN
Status: DC | PRN
  Administered 2016-02-17: 3000 mL
  Administered 2016-02-17: 1000 mL

## 2016-02-17 MED ORDER — ROPIVACAINE HCL 7.5 MG/ML IJ SOLN
INTRAMUSCULAR | Status: DC | PRN
  Administered 2016-02-17: 20 mL via PERINEURAL

## 2016-02-17 MED ORDER — HYDROMORPHONE HCL 2 MG/ML IJ SOLN
0.5000 mg | INTRAMUSCULAR | Status: DC | PRN
  Administered 2016-02-17 – 2016-02-18 (×3): 1 mg via INTRAVENOUS
  Filled 2016-02-17 (×3): qty 1

## 2016-02-17 MED ORDER — ACETAMINOPHEN 650 MG RE SUPP: 650.0000 mg | Freq: Four times a day (QID) | RECTAL | Status: DC | PRN

## 2016-02-17 MED ORDER — LIDOCAINE HCL (CARDIAC) 20 MG/ML IV SOLN
INTRAVENOUS | Status: DC | PRN
  Administered 2016-02-17: 40 mg via INTRATRACHEAL

## 2016-02-17 MED ORDER — METOCLOPRAMIDE HCL 5 MG PO TABS
5.0000 mg | ORAL_TABLET | Freq: Three times a day (TID) | ORAL | Status: DC | PRN
Start: 2016-02-17 — End: 2016-02-19

## 2016-02-17 MED ORDER — MIDAZOLAM HCL 2 MG/2ML IJ SOLN
INTRAMUSCULAR | Status: AC
  Filled 2016-02-17: qty 2

## 2016-02-17 MED ORDER — PROMETHAZINE HCL 25 MG/ML IJ SOLN: 6.2500 mg | INTRAMUSCULAR | Status: DC | PRN

## 2016-02-17 MED ORDER — BUPIVACAINE HCL 0.5 % IJ SOLN
INTRAMUSCULAR | Status: DC | PRN
  Administered 2016-02-17: 10 mL

## 2016-02-17 MED ORDER — CEFAZOLIN SODIUM-DEXTROSE 2-4 GM/100ML-% IV SOLN
2.0000 g | INTRAVENOUS | Status: AC
Start: 2016-02-17 — End: 2016-02-17
  Administered 2016-02-17: 2 g via INTRAVENOUS
  Filled 2016-02-17: qty 100

## 2016-02-17 MED ORDER — LACTATED RINGERS IV SOLN
INTRAVENOUS | Status: DC
  Administered 2016-02-17 (×2): via INTRAVENOUS

## 2016-02-17 MED ORDER — FENTANYL CITRATE (PF) 100 MCG/2ML IJ SOLN
INTRAMUSCULAR | Status: AC
  Filled 2016-02-17: qty 2

## 2016-02-17 MED ORDER — CEFAZOLIN SODIUM-DEXTROSE 2-4 GM/100ML-% IV SOLN
2.0000 g | Freq: Four times a day (QID) | INTRAVENOUS | Status: AC
  Administered 2016-02-17 (×2): 2 g via INTRAVENOUS
  Filled 2016-02-17 (×2): qty 100

## 2016-02-17 MED ORDER — DIPHENHYDRAMINE HCL 12.5 MG/5ML PO ELIX: 12.5000 mg | ORAL_SOLUTION | ORAL | Status: DC | PRN

## 2016-02-17 MED ORDER — DIAZEPAM 2 MG PO TABS
2.0000 mg | ORAL_TABLET | Freq: Three times a day (TID) | ORAL | Status: DC | PRN
  Administered 2016-02-17 – 2016-02-19 (×3): 2 mg via ORAL
  Filled 2016-02-17 (×2): qty 1

## 2016-02-17 MED ORDER — DIAZEPAM 5 MG PO TABS
ORAL_TABLET | ORAL | Status: AC
  Filled 2016-02-17: qty 1

## 2016-02-17 MED ORDER — POLYETHYLENE GLYCOL 3350 17 G PO PACK: 17.0000 g | PACK | Freq: Every day | ORAL | Status: DC | PRN

## 2016-02-17 MED ORDER — ONDANSETRON HCL 4 MG PO TABS: 4.0000 mg | ORAL_TABLET | Freq: Three times a day (TID) | ORAL | 0 refills | Status: DC | PRN

## 2016-02-17 MED ORDER — PHENOL 1.4 % MT LIQD: 1.0000 | OROMUCOSAL | Status: DC | PRN

## 2016-02-17 MED ORDER — POTASSIUM CHLORIDE IN NACL 20-0.9 MEQ/L-% IV SOLN
INTRAVENOUS | Status: DC
  Administered 2016-02-17 – 2016-02-18 (×2): via INTRAVENOUS
  Filled 2016-02-17 (×2): qty 1000

## 2016-02-17 MED ORDER — METOCLOPRAMIDE HCL 5 MG/ML IJ SOLN: 5.0000 mg | Freq: Three times a day (TID) | INTRAMUSCULAR | Status: DC | PRN

## 2016-02-17 MED ORDER — INSULIN ASPART 100 UNIT/ML ~~LOC~~ SOLN: 0.0000 [IU] | Freq: Every day | SUBCUTANEOUS | Status: DC

## 2016-02-17 MED ORDER — OXYCODONE HCL 5 MG PO TABS
5.0000 mg | ORAL_TABLET | ORAL | Status: DC | PRN
  Administered 2016-02-17 (×2): 5 mg via ORAL
  Administered 2016-02-18 – 2016-02-19 (×7): 10 mg via ORAL
  Filled 2016-02-17: qty 2
  Filled 2016-02-17: qty 1
  Filled 2016-02-17 (×6): qty 2

## 2016-02-17 MED ORDER — ALUM & MAG HYDROXIDE-SIMETH 200-200-20 MG/5ML PO SUSP: 30.0000 mL | ORAL | Status: DC | PRN

## 2016-02-17 MED ORDER — MIDAZOLAM HCL 5 MG/5ML IJ SOLN
INTRAMUSCULAR | Status: DC | PRN
  Administered 2016-02-17: 2 mg via INTRAVENOUS

## 2016-02-17 MED ORDER — FENTANYL CITRATE (PF) 100 MCG/2ML IJ SOLN
INTRAMUSCULAR | Status: DC | PRN
  Administered 2016-02-17: 50 ug via INTRAVENOUS
  Administered 2016-02-17: 25 ug via INTRAVENOUS
  Administered 2016-02-17 (×2): 50 ug via INTRAVENOUS
  Administered 2016-02-17: 25 ug via INTRAVENOUS

## 2016-02-17 MED ORDER — ONDANSETRON HCL 4 MG/2ML IJ SOLN
INTRAMUSCULAR | Status: DC | PRN
  Administered 2016-02-17: 4 mg via INTRAVENOUS

## 2016-02-17 MED ORDER — BUPIVACAINE LIPOSOME 1.3 % IJ SUSP
20.0000 mL | Freq: Once | INTRAMUSCULAR | Status: DC
  Filled 2016-02-17: qty 20

## 2016-02-17 MED ORDER — SODIUM CHLORIDE 0.9 % IJ SOLN
INTRAMUSCULAR | Status: DC | PRN
  Administered 2016-02-17: 40 mL via INTRAVENOUS

## 2016-02-17 MED ORDER — MAGNESIUM CITRATE PO SOLN: 1.0000 | Freq: Once | ORAL | Status: DC | PRN

## 2016-02-17 MED ORDER — BUPIVACAINE HCL (PF) 0.5 % IJ SOLN
INTRAMUSCULAR | Status: AC
  Filled 2016-02-17: qty 10

## 2016-02-17 MED ORDER — BUPIVACAINE IN DEXTROSE 0.75-8.25 % IT SOLN
INTRATHECAL | Status: DC | PRN
  Administered 2016-02-17: 2 mL via INTRATHECAL

## 2016-02-17 MED ORDER — HYDROMORPHONE HCL 1 MG/ML IJ SOLN
0.2500 mg | INTRAMUSCULAR | Status: DC | PRN
  Administered 2016-02-17: 0.5 mg via INTRAVENOUS

## 2016-02-17 MED ORDER — CHLORHEXIDINE GLUCONATE 4 % EX LIQD: 60.0000 mL | Freq: Once | CUTANEOUS | Status: DC

## 2016-02-17 MED ORDER — CELECOXIB 200 MG PO CAPS
200.0000 mg | ORAL_CAPSULE | Freq: Two times a day (BID) | ORAL | Status: DC
  Administered 2016-02-17 – 2016-02-19 (×4): 200 mg via ORAL
  Filled 2016-02-17 (×4): qty 1

## 2016-02-17 MED ORDER — ONDANSETRON HCL 4 MG/2ML IJ SOLN: 4.0000 mg | Freq: Four times a day (QID) | INTRAMUSCULAR | Status: DC | PRN

## 2016-02-17 MED ORDER — APIXABAN 2.5 MG PO TABS
2.5000 mg | ORAL_TABLET | Freq: Two times a day (BID) | ORAL | Status: DC
  Administered 2016-02-18 – 2016-02-19 (×3): 2.5 mg via ORAL
  Filled 2016-02-17 (×3): qty 1

## 2016-02-17 MED ORDER — ONDANSETRON HCL 4 MG PO TABS: 4.0000 mg | ORAL_TABLET | Freq: Four times a day (QID) | ORAL | Status: DC | PRN

## 2016-02-17 MED ORDER — INSULIN ASPART 100 UNIT/ML ~~LOC~~ SOLN
0.0000 [IU] | Freq: Three times a day (TID) | SUBCUTANEOUS | Status: DC
  Administered 2016-02-17: 3 [IU] via SUBCUTANEOUS
  Administered 2016-02-18 (×2): 2 [IU] via SUBCUTANEOUS
  Administered 2016-02-18 – 2016-02-19 (×2): 3 [IU] via SUBCUTANEOUS

## 2016-02-17 MED ORDER — PROPOFOL 10 MG/ML IV BOLUS
INTRAVENOUS | Status: AC
  Filled 2016-02-17: qty 40

## 2016-02-17 MED ORDER — HYDROMORPHONE HCL 1 MG/ML IJ SOLN: 0.5000 mg | INTRAMUSCULAR | Status: DC | PRN

## 2016-02-17 MED ORDER — GLIMEPIRIDE 2 MG PO TABS
2.0000 mg | ORAL_TABLET | Freq: Every day | ORAL | Status: DC
  Administered 2016-02-18 – 2016-02-19 (×2): 2 mg via ORAL
  Filled 2016-02-17 (×3): qty 1

## 2016-02-17 MED ORDER — BISACODYL 5 MG PO TBEC: 5.0000 mg | DELAYED_RELEASE_TABLET | Freq: Every day | ORAL | Status: DC | PRN

## 2016-02-17 MED ORDER — HYDROMORPHONE HCL 1 MG/ML IJ SOLN
INTRAMUSCULAR | Status: AC
  Filled 2016-02-17: qty 0.5

## 2016-02-17 MED ORDER — DOCUSATE SODIUM 100 MG PO CAPS
100.0000 mg | ORAL_CAPSULE | Freq: Two times a day (BID) | ORAL | Status: DC
  Administered 2016-02-17 – 2016-02-19 (×4): 100 mg via ORAL
  Filled 2016-02-17 (×4): qty 1

## 2016-02-17 MED ORDER — BUPIVACAINE LIPOSOME 1.3 % IJ SUSP
INTRAMUSCULAR | Status: DC | PRN
  Administered 2016-02-17: 20 mL

## 2016-02-17 MED ORDER — OXYCODONE HCL 5 MG PO TABS
ORAL_TABLET | ORAL | Status: AC
  Filled 2016-02-17: qty 1

## 2016-02-17 SURGICAL SUPPLY — 69 items
APL SKNCLS STERI-STRIP NONHPOA (GAUZE/BANDAGES/DRESSINGS) ×1
BANDAGE ACE 4X5 VEL STRL LF (GAUZE/BANDAGES/DRESSINGS) ×3 IMPLANT
BANDAGE ACE 6X5 VEL STRL LF (GAUZE/BANDAGES/DRESSINGS) ×3 IMPLANT
BANDAGE ELASTIC 6 VELCRO ST LF (GAUZE/BANDAGES/DRESSINGS) ×2 IMPLANT
BANDAGE ESMARK 6X9 LF (GAUZE/BANDAGES/DRESSINGS) ×1 IMPLANT
BENZOIN TINCTURE PRP APPL 2/3 (GAUZE/BANDAGES/DRESSINGS) ×3 IMPLANT
BLADE SAG 18X100X1.27 (BLADE) ×6 IMPLANT
BNDG CMPR 9X6 STRL LF SNTH (GAUZE/BANDAGES/DRESSINGS) ×1
BNDG ESMARK 6X9 LF (GAUZE/BANDAGES/DRESSINGS) ×3
BOWL SMART MIX CTS (DISPOSABLE) ×3 IMPLANT
CAPT KNEE TOTAL 3 ×2 IMPLANT
CEMENT BONE SIMPLEX SPEEDSET (Cement) ×6 IMPLANT
CLOSURE WOUND 1/2 X4 (GAUZE/BANDAGES/DRESSINGS) ×1
COVER SURGICAL LIGHT HANDLE (MISCELLANEOUS) ×3 IMPLANT
CUFF TOURNIQUET SINGLE 34IN LL (TOURNIQUET CUFF) ×3 IMPLANT
DRAPE HALF SHEET 40X57 (DRAPES) ×3 IMPLANT
DRAPE IMP U-DRAPE 54X76 (DRAPES) ×3 IMPLANT
DRAPE PROXIMA HALF (DRAPES) ×3 IMPLANT
DRAPE U-SHAPE 47X51 STRL (DRAPES) ×3 IMPLANT
DRSG AQUACEL AG ADV 3.5X10 (GAUZE/BANDAGES/DRESSINGS) ×3 IMPLANT
DURAPREP 26ML APPLICATOR (WOUND CARE) ×6 IMPLANT
ELECT CAUTERY BLADE 6.4 (BLADE) ×3 IMPLANT
ELECT REM PT RETURN 9FT ADLT (ELECTROSURGICAL) ×3
ELECTRODE REM PT RTRN 9FT ADLT (ELECTROSURGICAL) ×1 IMPLANT
EVACUATOR 1/8 PVC DRAIN (DRAIN) IMPLANT
FACESHIELD WRAPAROUND (MASK) ×6 IMPLANT
FACESHIELD WRAPAROUND OR TEAM (MASK) ×2 IMPLANT
GLOVE BIOGEL PI IND STRL 7.0 (GLOVE) ×1 IMPLANT
GLOVE BIOGEL PI INDICATOR 7.0 (GLOVE) ×2
GLOVE ECLIPSE 7.0 STRL STRAW (GLOVE) ×2 IMPLANT
GLOVE ORTHO TXT STRL SZ7.5 (GLOVE) ×3 IMPLANT
GLOVE SURG ORTHO 7.0 STRL STRW (GLOVE) ×3 IMPLANT
GLOVE SURG SS PI 8.0 STRL IVOR (GLOVE) ×4 IMPLANT
GOWN STRL REUS W/ TWL LRG LVL3 (GOWN DISPOSABLE) ×2 IMPLANT
GOWN STRL REUS W/ TWL XL LVL3 (GOWN DISPOSABLE) ×1 IMPLANT
GOWN STRL REUS W/TWL LRG LVL3 (GOWN DISPOSABLE) ×6
GOWN STRL REUS W/TWL XL LVL3 (GOWN DISPOSABLE) ×3
HANDPIECE INTERPULSE COAX TIP (DISPOSABLE) ×3
IMMOBILIZER KNEE 22 UNIV (SOFTGOODS) ×3 IMPLANT
IMMOBILIZER KNEE 24 THIGH 36 (MISCELLANEOUS) IMPLANT
IMMOBILIZER KNEE 24 UNIV (MISCELLANEOUS)
KIT BASIN OR (CUSTOM PROCEDURE TRAY) ×3 IMPLANT
KIT ROOM TURNOVER OR (KITS) ×3 IMPLANT
MANIFOLD NEPTUNE II (INSTRUMENTS) ×3 IMPLANT
NDL 18GX1X1/2 (RX/OR ONLY) (NEEDLE) ×1 IMPLANT
NDL HYPO 25GX1X1/2 BEV (NEEDLE) ×1 IMPLANT
NEEDLE 18GX1X1/2 (RX/OR ONLY) (NEEDLE) ×3 IMPLANT
NEEDLE HYPO 25GX1X1/2 BEV (NEEDLE) ×3 IMPLANT
NS IRRIG 1000ML POUR BTL (IV SOLUTION) ×3 IMPLANT
PACK TOTAL JOINT (CUSTOM PROCEDURE TRAY) ×3 IMPLANT
PACK UNIVERSAL I (CUSTOM PROCEDURE TRAY) ×3 IMPLANT
PAD ARMBOARD 7.5X6 YLW CONV (MISCELLANEOUS) ×6 IMPLANT
SET HNDPC FAN SPRY TIP SCT (DISPOSABLE) ×1 IMPLANT
STRIP CLOSURE SKIN 1/2X4 (GAUZE/BANDAGES/DRESSINGS) ×3 IMPLANT
SUCTION FRAZIER HANDLE 10FR (MISCELLANEOUS) ×2
SUCTION TUBE FRAZIER 10FR DISP (MISCELLANEOUS) ×1 IMPLANT
SUT MNCRL AB 4-0 PS2 18 (SUTURE) ×3 IMPLANT
SUT VIC AB 0 CT1 27 (SUTURE)
SUT VIC AB 0 CT1 27XBRD ANBCTR (SUTURE) IMPLANT
SUT VIC AB 1 CTX 36 (SUTURE) ×3
SUT VIC AB 1 CTX36XBRD ANBCTR (SUTURE) ×1 IMPLANT
SUT VIC AB 2-0 CT1 27 (SUTURE) ×6
SUT VIC AB 2-0 CT1 TAPERPNT 27 (SUTURE) ×2 IMPLANT
SYR 50ML LL SCALE MARK (SYRINGE) ×3 IMPLANT
SYR CONTROL 10ML LL (SYRINGE) ×3 IMPLANT
TOWEL OR 17X24 6PK STRL BLUE (TOWEL DISPOSABLE) ×3 IMPLANT
TOWEL OR 17X26 10 PK STRL BLUE (TOWEL DISPOSABLE) ×3 IMPLANT
TRAY CATH 16FR W/PLASTIC CATH (SET/KITS/TRAYS/PACK) IMPLANT
WATER STERILE IRR 1000ML POUR (IV SOLUTION) ×3 IMPLANT

## 2016-02-17 NOTE — Discharge Summary (Addendum)
Patient ID: Chad Yu MRN: [id number] DOB/AGE: May 25, 1950 66 y.o.  Admit date: 02/17/2016 Discharge date: 02/19/2016  Admission Diagnoses:  Active Problems:   Primary localized osteoarthritis of left knee   Discharge Diagnoses:  Same  Past Medical History:  Diagnosis Date  . Coronary artery disease   . Degenerative joint disease   . Diabetes mellitus   . Hyperlipidemia   . Hypertension   . Peripheral vascular disease ([place])     Surgeries: Procedure(s): TOTAL KNEE ARTHROPLASTY on 02/17/2016   Consultants:   Discharged Condition: Improved  Hospital Course: Chad Yu is an 66 y.o. male who was admitted 02/17/2016 for operative treatment of primary localized osteoarthritis left knee. Patient has severe unremitting pain that affects sleep, daily activities, and work/hobbies. After pre-op clearance the patient was taken to the operating room on 02/17/2016 and underwent  Procedure(s): TOTAL KNEE ARTHROPLASTY.    Patient was given perioperative antibiotics:  Anti-infectives    Start     Dose/Rate Route Frequency Ordered Stop   02/17/16 1530  ceFAZolin (ANCEF) IVPB 2g/100 mL premix     2 g 200 mL/hr over 30 Minutes Intravenous Every 6 hours 02/17/16 1010 02/17/16 2207   02/17/16 0629  ceFAZolin (ANCEF) IVPB 2g/100 mL premix     2 g 200 mL/hr over 30 Minutes Intravenous On call to O.R. 02/17/16 [id number] 02/17/16 [id number]       Patient was given sequential compression devices, early ambulation, and chemoprophylaxis to prevent DVT.  Patient benefited maximally from hospital stay and there were no complications.    Recent vital signs:  Patient Vitals for the past 24 hrs:  BP Temp Temp src Pulse Resp SpO2  02/19/16 0525 128/73 98.7 F (37.1 C) Oral 81 - 94 %  02/18/16 2100 (!) 149/77 99.7 F (37.6 C) Oral 91 17 94 %  02/18/16 1300 131/74 98.3 F (36.8 C) Oral 83 18 94 %     Recent laboratory studies:   Recent Labs  02/18/16 0329 02/19/16 0534  WBC 7.7 6.8  HGB  12.1* 11.2*  HCT 36.2* 32.9*  PLT 154 142*  NA 133* 134*  K 4.0 3.7  CL 100* 100*  CO2 25 25  BUN 20 12  CREATININE 1.15 1.06  GLUCOSE 214* 242*  CALCIUM 8.0* 8.3*     Discharge Medications:   Allergies as of 02/19/2016      Reactions   Lisinopril Other (See Comments)   Severe cough Hair ball in throat   Statins Hives, Other (See Comments)   Other reaction(s): Cough (ALLERGY/intolerance), Myalgias (intolerance) No cholesterol meds   Atorvastatin Other (See Comments)   REACTION: myalgies   Rosuvastatin Itching      Medication List    STOP taking these medications   ARTHRITIS STRENGTH BC POWDER PO   diclofenac sodium 1 % Gel Commonly known as:  VOLTAREN   ICY HOT EX   OVER THE COUNTER MEDICATION     TAKE these medications   apixaban 2.5 MG Tabs tablet Commonly known as:  ELIQUIS Take 1 tab po q12 hours x 14 days following surgery to prevent blood clots   glimepiride 2 MG tablet Commonly known as:  AMARYL Take 2 mg by mouth daily with breakfast.   insulin glargine 100 UNIT/ML injection Commonly known as:  LANTUS Inject 56 Units into the skin at bedtime.   ondansetron 4 MG tablet Commonly known as:  ZOFRAN Take 1 tablet (4 mg total) by mouth every 8 (eight) hours as needed for nausea or  vomiting.   oxyCODONE-acetaminophen 5-325 MG tablet Commonly known as:  PERCOCET Take 1-2 tablets by mouth every 4 (four) hours as needed for severe pain.   POTASSIMIN PO Take 3 tablets by mouth at bedtime.   vitamin E 1000 UNIT capsule Take 1,000 Units by mouth daily.       Diagnostic Studies: Dg Knee Left Port  Result Date: 02/17/2016 CLINICAL DATA:  Postop total left knee. EXAM: PORTABLE LEFT KNEE - 1-2 VIEW COMPARISON:  None. FINDINGS: Changes of left knee replacement. Soft tissue and joint space gas noted. No hardware or bony complicating feature. IMPRESSION: Left knee replacement.  No complicating feature. Electronically Signed   By: Rolm Baptise M.D.   On:  02/17/2016 09:56    Disposition: 01-Home or Self Care    Follow-up Information    Ninetta Lights, MD. Schedule an appointment as soon as possible for a visit in 2 week(s).   Specialty:  Orthopedic Surgery Contact information: [place] [address] [phone]        [facility] Follow up.   Specialty:  [place] Why:  Someone from [facility] will contact you to arrange start date and time for therapy. Contact information: [address] [place] [address] [phone]            Signed: Fannie Knee 02/19/2016, 8:10 AM

## 2016-02-17 NOTE — Anesthesia Postprocedure Evaluation (Signed)
Anesthesia Post Note  Patient: Chad Yu  Procedure(s) Performed: Procedure(s) (LRB): TOTAL KNEE ARTHROPLASTY (Left)  Patient location during evaluation: PACU Anesthesia Type: General Level of consciousness: awake and alert Pain management: pain level controlled Vital Signs Assessment: post-procedure vital signs reviewed and stable Respiratory status: spontaneous breathing, nonlabored ventilation, respiratory function stable and patient connected to nasal cannula oxygen Cardiovascular status: blood pressure returned to baseline and stable Postop Assessment: no signs of nausea or vomiting Anesthetic complications: no       Last Vitals:  Vitals:   02/17/16 1025 02/17/16 1035  BP: (!) 151/86   Pulse: 71 72  Resp: 19 18  Temp:      Last Pain:  Vitals:   02/17/16 1035  TempSrc:   PainSc: 0-No pain                 Tiajuana Amass

## 2016-02-17 NOTE — Evaluation (Signed)
Physical Therapy Evaluation Patient Details Name: Chad Yu MRN: [id number] DOB: 19-Mar-1950 Today's Date: 02/17/2016   History of Present Illness  66 y.o. male admitted to [facility] on 02/17/16 for elective L TKA.  Pt with significant PMHX of PVD, HTN, DM, CAD, R TKA (2005), L RTC repair, and back surgery.   Clinical Impression  Pt is POD #0 and is moving well, min assist overall with gait with RW into the hallway.  He will likely progress well enough to d/c home with wife and HH therapies.   PT to follow acutely for deficits listed below.       Follow Up Recommendations Home health PT;Supervision for mobility/OOB    Equipment Recommendations  None recommended by PT    Recommendations for Other Services   NA    Precautions / Restrictions Precautions Precautions: Knee Precaution Booklet Issued: Yes (comment) Precaution Comments: knee exercise handout given Restrictions Weight Bearing Restrictions: Yes LLE Weight Bearing: Weight bearing as tolerated      Mobility  Bed Mobility Overal bed mobility: Needs Assistance Bed Mobility: Supine to Sit     Supine to sit: Min assist;HOB elevated     General bed mobility comments: Min assist to help progress left leg over EOB.   Transfers Overall transfer level: Needs assistance Equipment used: Rolling walker (2 wheeled) Transfers: Sit to/from Stand Sit to Stand: Min assist         General transfer comment: Min assist to help support trunk during transitions. Verbal cues for safe hand placement.   Ambulation/Gait Ambulation/Gait assistance: Min assist Ambulation Distance (Feet): 70 Feet Assistive device: Rolling walker (2 wheeled) Gait Pattern/deviations: Step-through pattern;Antalgic Gait velocity: decreased Gait velocity interpretation: Below normal speed for age/gender General Gait Details: Pt with mildly antalgic gait pattern.  Verbal cues for upright posture and LE sequencing.  RW adjusted down to better fit height and  arm length at end of session.          Balance Overall balance assessment: Needs assistance Sitting-balance support: Feet supported;No upper extremity supported Sitting balance-Leahy Scale: Good     Standing balance support: Bilateral upper extremity supported Standing balance-Leahy Scale: Poor                               Pertinent Vitals/Pain Pain Assessment: 0-10 Pain Score: 5  Pain Location: left knee Pain Descriptors / Indicators: Aching;Burning Pain Intervention(s): Limited activity within patient's tolerance;Monitored during session;Repositioned    Home Living Family/patient expects to be discharged to:: Private residence Living Arrangements: Spouse/significant other Available Help at Discharge: Family Type of Home: House Home Access: Stairs to enter Entrance Stairs-Rails: Left Entrance Stairs-Number of Steps: 4 Home Layout: One level Home Equipment: Environmental consultant - 2 wheels;Hand held shower head      Prior Function Level of Independence: Independent                  Extremity/Trunk Assessment   Upper Extremity Assessment Upper Extremity Assessment: Defer to OT evaluation    Lower Extremity Assessment Lower Extremity Assessment: LLE deficits/detail LLE Deficits / Details: left leg with normal post op pain and weakness.  Ankle 3/5, knee 2/5, hip flexion 2+/5    Cervical / Trunk Assessment Cervical / Trunk Assessment: Other exceptions Cervical / Trunk Exceptions: h/o back surgery  Communication   Communication: No difficulties  Cognition Arousal/Alertness: Lethargic Behavior During Therapy: WFL for tasks assessed/performed Overall Cognitive Status: Within Functional Limits for tasks assessed  Exercises Total Joint Exercises Ankle Circles/Pumps: AROM;Both;20 reps   Assessment/Plan    PT Assessment Patient needs continued PT services  PT Problem List Decreased strength;Decreased range of  motion;Decreased balance;Decreased activity tolerance;Decreased mobility;Decreased knowledge of use of DME;Decreased knowledge of precautions;Pain          PT Treatment Interventions Stair training;Gait training;DME instruction;Functional mobility training;Therapeutic activities;Therapeutic exercise;Balance training;Patient/family education;Manual techniques;Modalities    PT Goals (Current goals can be found in the Care Plan section)  Acute Rehab PT Goals Patient Stated Goal: to get back home PT Goal Formulation: With patient Time For Goal Achievement: 02/24/16 Potential to Achieve Goals: Good    Frequency 7X/week           End of Session Equipment Utilized During Treatment: Gait belt;Left knee immobilizer Activity Tolerance: Patient limited by pain Patient left: in chair;with call bell/phone within reach;with family/visitor present           Time: 1659-1736 PT Time Calculation (min) (ACUTE ONLY): 37 min   Charges:   PT Evaluation $PT Eval Moderate Complexity: 1 Procedure PT Treatments $Gait Training: 8-22 mins        Chaunta Bejarano B. [place], [place], DPT [phone]   02/17/2016, 5:42 PM

## 2016-02-17 NOTE — Anesthesia Procedure Notes (Signed)
Anesthesia Regional Block:  Adductor canal block  Pre-Anesthetic Checklist: ,, timeout performed, Correct Patient, Correct Site, Correct Laterality, Correct Procedure, Correct Position, site marked, Risks and benefits discussed,  Surgical consent,  Pre-op evaluation,  At surgeon's request and post-op pain management  Laterality: Left  Prep: chloraprep       Needles:  Injection technique: Single-shot  Needle Type: Echogenic Needle     Needle Length: 9cm 9 cm Needle Gauge: 21 and 21 G    Additional Needles:  Procedures: ultrasound guided (picture in chart) Adductor canal block Narrative:  Start time: 02/17/2016 7:04 AM End time: 02/17/2016 7:11 AM Injection made incrementally with aspirations every 5 mL.  Performed by: Personally  Anesthesiologist: Suzette Battiest

## 2016-02-17 NOTE — Transfer of Care (Signed)
Immediate Anesthesia Transfer of Care Note  Patient: Chad Yu  Procedure(s) Performed: Procedure(s): TOTAL KNEE ARTHROPLASTY (Left)  Patient Location: PACU  Anesthesia Type:Spinal and GA combined with regional for post-op pain  Level of Consciousness: awake, alert  and oriented  Airway & Oxygen Therapy: Patient Spontanous Breathing and Patient connected to nasal cannula oxygen  Post-op Assessment: Report given to RN and Post -op Vital signs reviewed and stable  Post vital signs: Reviewed and stable  Last Vitals:  Vitals:   02/17/16 0940 02/17/16 0945  BP: (!) 141/86   Pulse: 75 72  Resp: 14 (!) 23  Temp: 36.4 C     Last Pain:  Vitals:   02/17/16 0629  TempSrc: Oral      Patients Stated Pain Goal: 6 ([phone] [id number])  Complications: No apparent anesthesia complications

## 2016-02-17 NOTE — Progress Notes (Signed)
Report back to R. Mancel Bale RN as primary caregiver.

## 2016-02-17 NOTE — Progress Notes (Signed)
Orthopedic Tech Progress Note Patient Details:  Chad Yu [birth year] [id number]  CPM Left Knee CPM Left Knee: On Left Knee Flexion (Degrees): 90 Left Knee Extension (Degrees): 0 Additional Comments: trapeze bar patient helper   Hildred Priest 02/17/2016, 10:10 AM Viewed order from doctor's order list

## 2016-02-17 NOTE — Anesthesia Procedure Notes (Signed)
Spinal  Staffing Anesthesiologist: Suzette Battiest Performed: anesthesiologist  Preanesthetic Checklist Completed: patient identified, site marked, surgical consent, pre-op evaluation, timeout performed, IV checked, risks and benefits discussed and monitors and equipment checked Spinal Block Patient position: sitting Prep: site prepped and draped and DuraPrep Patient monitoring: heart rate, continuous pulse ox and blood pressure Approach: right paramedian Location: L4-5 Injection technique: single-shot Needle Needle type: Quincke  Needle gauge: 22 G Needle length: 9 cm Assessment Events: failed spinal Additional Notes Free flowing CSF and aspiration before and after injection LA. No level present after 10 min. Converted to general anesthesia.

## 2016-02-17 NOTE — Interval H&P Note (Signed)
History and Physical Interval Note:  02/17/2016 7:27 AM  Chad Yu  has presented today for surgery, with the diagnosis of djd left knee  The various methods of treatment have been discussed with the patient and family. After consideration of risks, benefits and other options for treatment, the patient has consented to  Procedure(s): TOTAL KNEE ARTHROPLASTY (Left) as a surgical intervention .  The patient's history has been reviewed, patient examined, no change in status, stable for surgery.  I have reviewed the patient's chart and labs.  Questions were answered to the patient's satisfaction.     Ninetta Lights

## 2016-02-17 NOTE — Anesthesia Procedure Notes (Signed)
Procedure Name: LMA Insertion Date/Time: 02/17/2016 7:51 AM Performed by: Mariea Clonts Pre-anesthesia Checklist: Patient identified, Emergency Drugs available, Suction available and Patient being monitored Patient Re-evaluated:Patient Re-evaluated prior to inductionOxygen Delivery Method: Circle System Utilized Preoxygenation: Pre-oxygenation with 100% oxygen Intubation Type: IV induction Ventilation: Mask ventilation without difficulty LMA: LMA inserted LMA Size: 4.0 and 5.0 Number of attempts: 1 Airway Equipment and Method: Bite block Placement Confirmation: positive ETCO2 Tube secured with: Tape Dental Injury: Teeth and Oropharynx as per pre-operative assessment

## 2016-02-18 ENCOUNTER — Encounter (HOSPITAL_COMMUNITY): Payer: Self-pay | Admitting: Orthopedic Surgery

## 2016-02-18 DIAGNOSIS — E1151 Type 2 diabetes mellitus with diabetic peripheral angiopathy without gangrene: Secondary | ICD-10-CM | POA: Diagnosis not present

## 2016-02-18 DIAGNOSIS — Z794 Long term (current) use of insulin: Secondary | ICD-10-CM | POA: Diagnosis not present

## 2016-02-18 DIAGNOSIS — M1712 Unilateral primary osteoarthritis, left knee: Secondary | ICD-10-CM | POA: Diagnosis present

## 2016-02-18 DIAGNOSIS — Z96651 Presence of right artificial knee joint: Secondary | ICD-10-CM | POA: Diagnosis not present

## 2016-02-18 DIAGNOSIS — K219 Gastro-esophageal reflux disease without esophagitis: Secondary | ICD-10-CM | POA: Diagnosis not present

## 2016-02-18 DIAGNOSIS — I10 Essential (primary) hypertension: Secondary | ICD-10-CM | POA: Diagnosis not present

## 2016-02-18 DIAGNOSIS — M24562 Contracture, left knee: Secondary | ICD-10-CM | POA: Diagnosis not present

## 2016-02-18 DIAGNOSIS — Z888 Allergy status to other drugs, medicaments and biological substances status: Secondary | ICD-10-CM | POA: Diagnosis not present

## 2016-02-18 DIAGNOSIS — M21162 Varus deformity, not elsewhere classified, left knee: Secondary | ICD-10-CM | POA: Diagnosis not present

## 2016-02-18 DIAGNOSIS — Z955 Presence of coronary angioplasty implant and graft: Secondary | ICD-10-CM | POA: Diagnosis not present

## 2016-02-18 DIAGNOSIS — D62 Acute posthemorrhagic anemia: Secondary | ICD-10-CM | POA: Diagnosis not present

## 2016-02-18 DIAGNOSIS — I2510 Atherosclerotic heart disease of native coronary artery without angina pectoris: Secondary | ICD-10-CM | POA: Diagnosis not present

## 2016-02-18 LAB — CBC
HCT: 36.2 % — ABNORMAL LOW (ref 39.0–52.0)
HEMOGLOBIN: 12.1 g/dL — AB (ref 13.0–17.0)
MCH: 30.6 pg (ref 26.0–34.0)
MCHC: 33.4 g/dL (ref 30.0–36.0)
MCV: 91.4 fL (ref 78.0–100.0)
Platelets: 154 10*3/uL (ref 150–400)
RBC: 3.96 MIL/uL — ABNORMAL LOW (ref 4.22–5.81)
RDW: 13.6 % (ref 11.5–15.5)
WBC: 7.7 10*3/uL (ref 4.0–10.5)

## 2016-02-18 LAB — GLUCOSE, CAPILLARY
Glucose-Capillary: 166 mg/dL — ABNORMAL HIGH (ref 65–99)
Glucose-Capillary: 193 mg/dL — ABNORMAL HIGH (ref 65–99)
Glucose-Capillary: 213 mg/dL — ABNORMAL HIGH (ref 65–99)
Glucose-Capillary: 150 mg/dL — ABNORMAL HIGH (ref 65–99)

## 2016-02-18 LAB — HEMOGLOBIN A1C
Hgb A1c MFr Bld: 8.7 % — ABNORMAL HIGH (ref 4.8–5.6)
Mean Plasma Glucose: 203 mg/dL

## 2016-02-18 LAB — BASIC METABOLIC PANEL
ANION GAP: 8 (ref 5–15)
BUN: 20 mg/dL (ref 6–20)
CALCIUM: 8 mg/dL — AB (ref 8.9–10.3)
CHLORIDE: 100 mmol/L — AB (ref 101–111)
CO2: 25 mmol/L (ref 22–32)
CREATININE: 1.15 mg/dL (ref 0.61–1.24)
GFR calc non Af Amer: >60 mL/min (ref >60)
Glucose, Bld: 214 mg/dL — ABNORMAL HIGH (ref 65–99)
Potassium: 4 mmol/L (ref 3.5–5.1)
SODIUM: 133 mmol/L — AB (ref 135–145)

## 2016-02-18 NOTE — Progress Notes (Signed)
OT Evaluation Addendum: G Code Addition    Mar 08, 2016 1500  OT G-codes **NOT FOR INPATIENT CLASS**  Functional Assessment Tool Used Clinical Judgement  Functional Limitation Self care  Self Care Current Status [phone]) CK  Self Care Goal Status [id number]) CI  Self Care Discharge Status [phone]) CK   Hulda Humphrey OTR/L [phone]

## 2016-02-18 NOTE — Progress Notes (Signed)
Physical Therapy Treatment Patient Details Name: Chad Yu MRN: [id number] DOB: [birth year] Today's Date: 02/18/2016    History of Present Illness 66 y.o. male admitted to [facility] on 02/17/16 for elective L TKA.  Pt with significant PMHX of PVD, HTN, DM, CAD, R TKA (2005), L RTC repair, and back surgery.     PT Comments    Pt is POD #1 and is moving well. He was able to start stair training and continues to progress his HEP.  PT will continue to follow acutely to progress mobility and reinforce education.   Follow Up Recommendations  Home health PT;Supervision for mobility/OOB     Equipment Recommendations  None recommended by PT    Recommendations for Other Services   NA     Precautions / Restrictions Precautions Precautions: Knee Precaution Booklet Issued: Yes (comment) Precaution Comments: reviewed no pillow under knee Restrictions Weight Bearing Restrictions: Yes LLE Weight Bearing: Weight bearing as tolerated    Mobility  Bed Mobility Overal bed mobility: Needs Assistance Bed Mobility: Supine to Sit     Supine to sit: Supervision;HOB elevated     General bed mobility comments: Pt was OOB in recliner chair when PT entered the room.   Transfers Overall transfer level: Needs assistance Equipment used: Rolling walker (2 wheeled) Transfers: Sit to/from Stand Sit to Stand: Min guard         General transfer comment: Min guard assist for safety, verbal cues for safe hand placement during transitions.   Ambulation/Gait Ambulation/Gait assistance: Min guard Ambulation Distance (Feet): 85 Feet Assistive device: Rolling walker (2 wheeled) Gait Pattern/deviations: Step-to pattern;Antalgic;Trunk flexed Gait velocity: decreased Gait velocity interpretation: Below normal speed for age/gender General Gait Details: Pt with moderately antalgic gait pattern, verbal cues for upright posture.  KI doneed before ambulating.    Stairs Stairs: Yes   Stair Management: One  rail Left;Step to pattern;Forwards Number of Stairs: 2 General stair comments: Min guard assist for safety, verbal cues for LE sequencing and safe hand placement.          Balance Overall balance assessment: Needs assistance Sitting-balance support: Feet supported;No upper extremity supported Sitting balance-Leahy Scale: Good Sitting balance - Comments: sitting EOB with no back support   Standing balance support: Bilateral upper extremity supported Standing balance-Leahy Scale: Poor Standing balance comment: standing for sink level ADL                    Cognition Arousal/Alertness: Awake/alert Behavior During Therapy: WFL for tasks assessed/performed Overall Cognitive Status: Within Functional Limits for tasks assessed                      Exercises Total Joint Exercises Ankle Circles/Pumps: AROM;Both;20 reps Quad Sets: AROM;Left;10 reps Towel Squeeze: AROM;Both;10 reps Heel Slides: AAROM;Left;10 reps        Pertinent Vitals/Pain Pain Assessment: 0-10 Pain Score: 5  Pain Location: left knee Pain Descriptors / Indicators: Aching;Burning Pain Intervention(s): Limited activity within patient's tolerance;Monitored during session;Repositioned    Home Living Family/patient expects to be discharged to:: Private residence Living Arrangements: Spouse/significant other Available Help at Discharge: Family;Available PRN/intermittently Type of Home: House Home Access: Stairs to enter Entrance Stairs-Rails: Left Home Layout: One level Home Equipment: Walker - 2 wheels;Hand held shower head (3 in 1)      Prior Function Level of Independence: Independent      Comments: works at [employer] part time   PT Goals (current goals can now be found in  the care plan section) Acute Rehab PT Goals Patient Stated Goal: get back to work soon Progress towards PT goals: Progressing toward goals    Frequency    7X/week      PT Plan Current plan  remains appropriate       End of Session Equipment Utilized During Treatment: Gait belt;Left knee immobilizer Activity Tolerance: Patient limited by pain Patient left: in chair;with call bell/phone within reach     Time: 1111-1131 PT Time Calculation (min) (ACUTE ONLY): 20 min  Charges:  $Gait Training: 8-22 mins                      Taylor Levick B. [place], [place], DPT [phone]   02/18/2016, 4:46 PM

## 2016-02-18 NOTE — Evaluation (Signed)
Occupational Therapy Evaluation and Discharge Patient Details Name: Chad Yu MRN: [id number] DOB: 03/24/1950 Today's Date: 02/18/2016    History of Present Illness 66 y.o. male admitted to [facility] on 02/17/16 for elective L TKA.  Pt with significant PMHX of PVD, HTN, DM, CAD, R TKA (2005), L RTC repair, and back surgery.    Clinical Impression   PTA Pt independent in ADL and mobility. Pt currently mod A for LB ADL and supervision for mobility with RW. Pt fully assessed, and received all education on compensatory strategies, safety, and transfers. Pt with no questions or concerns at the end of session. OT to sign off at this time as education is complete. Thank you for this referral.     Follow Up Recommendations  No OT follow up;Supervision - Intermittent    Equipment Recommendations  None recommended by OT (Pt has appropriate DME)    Recommendations for Other Services       Precautions / Restrictions Precautions Precautions: Knee Precaution Booklet Issued: Yes (comment) Precaution Comments: reviewed no pillow under knee Restrictions Weight Bearing Restrictions: Yes LLE Weight Bearing: Weight bearing as tolerated      Mobility Bed Mobility Overal bed mobility: Needs Assistance Bed Mobility: Supine to Sit     Supine to sit: Supervision;HOB elevated     General bed mobility comments: Pt used RLE to support LLE over EOB  Transfers Overall transfer level: Needs assistance Equipment used: Rolling walker (2 wheeled) Transfers: Sit to/from Stand Sit to Stand: Min guard         General transfer comment: vc for safe hand placement    Balance Overall balance assessment: Needs assistance Sitting-balance support: Feet supported;No upper extremity supported Sitting balance-Leahy Scale: Good Sitting balance - Comments: sitting EOB with no back support   Standing balance support: Bilateral upper extremity supported;Single extremity supported;During functional  activity Standing balance-Leahy Scale: Poor (approaching fair) Standing balance comment: standing for sink level ADL                            ADL Overall ADL's : Needs assistance/impaired Eating/Feeding: Modified independent;Sitting   Grooming: Wash/dry hands;Oral care;Supervision/safety;Standing Grooming Details (indicate cue type and reason): sink level Upper Body Bathing: Modified independent   Lower Body Bathing: Moderate assistance   Upper Body Dressing : Modified independent   Lower Body Dressing: Moderate assistance;With caregiver independent assisting;Sit to/from stand   Toilet Transfer: Min Forensic psychologist Details (indicate cue type and reason): simulated with recliner     Tub/ Shower Transfer: Tub transfer;Min guard;Cueing for sequencing;Anterior/posterior;3 in Mudlogger Details (indicate cue type and reason): educated on non-operated leg in first, and have caregiver present for safety Functional mobility during ADLs: Supervision/safety;Rolling walker       Vision Vision Assessment?: No apparent visual deficits   Perception     Praxis      Pertinent Vitals/Pain Pain Assessment: 0-10 Pain Score: 5  Pain Location: left knee Pain Descriptors / Indicators: Aching;Burning Pain Intervention(s): Monitored during session;Repositioned;Ice applied;Limited activity within patient's tolerance     Hand Dominance Right   Extremity/Trunk Assessment Upper Extremity Assessment Upper Extremity Assessment: Overall WFL for tasks assessed   Lower Extremity Assessment Lower Extremity Assessment: LLE deficits/detail LLE Deficits / Details: left leg with normal post op pain and weakness.   Cervical / Trunk Assessment Cervical / Trunk Assessment: Other exceptions Cervical / Trunk Exceptions: h/o back surgery   Communication Communication Communication: No difficulties  Cognition Arousal/Alertness:  Awake/alert Behavior During Therapy: WFL for tasks assessed/performed Overall Cognitive Status: Within Functional Limits for tasks assessed                     General Comments       Exercises       Shoulder Instructions      Home Living Family/patient expects to be discharged to:: Private residence Living Arrangements: Spouse/significant other Available Help at Discharge: Family;Available PRN/intermittently Type of Home: House Home Access: Stairs to enter [employer] of Steps: 4 Entrance Stairs-Rails: Left Home Layout: One level     Bathroom Shower/Tub: Tub/shower unit;Walk-in shower Shower/tub characteristics: Architectural technologist: Standard Bathroom Accessibility: Yes How Accessible: Accessible via walker Home Equipment: Walker - 2 wheels;Hand held shower head (3 in 1)          Prior Functioning/Environment Level of Independence: Independent        Comments: works at the [employer] part time        OT Problem List:     OT Treatment/Interventions:      OT Goals(Current goals can be found in the care plan section) Acute Rehab OT Goals Patient Stated Goal: get back to work soon OT Goal Formulation: With patient Time For Goal Achievement: 03/03/16 Potential to Achieve Goals: Good  OT Frequency:     Barriers to D/C:            Co-evaluation              End of Session Equipment Utilized During Treatment: Gait belt;Rolling walker CPM Left Knee CPM Left Knee: Off Nurse Communication: Mobility status;Weight bearing status;Other (comment) (OT complete)  Activity Tolerance: Patient tolerated treatment well Patient left: in chair;with call bell/phone within reach;Other (comment) (in bone foam)   Time: [id number] OT Time Calculation (min): 18 min Charges:  OT General Charges $OT Visit: 1 Procedure OT Evaluation $OT Eval Moderate Complexity: 1 Procedure G-Codes:    Merri Ray Shellby Schlink 2016-03-10, 3:18 PM

## 2016-02-18 NOTE — Discharge Instructions (Addendum)

## 2016-02-18 NOTE — Progress Notes (Signed)
Orthopedic Tech Progress Note Patient Details:  Chad Yu 09-May-1950 [id number]  Patient ID: Chad Yu, male   DOB: [birth year], 66 y.o.   MRN: [id number] Applied cpm 0-60  Karolee Stamps 02/18/2016, 5:42 AM

## 2016-02-18 NOTE — Care Management Obs Status (Signed)
[place] NOTIFICATION   Patient Details  Name: Chad Yu MRN: [id number] Date of Birth: 31-Jul-1950   Medicare Observation Status Notification Given:  Yes    Ninfa Meeker, RN 02/18/2016, 2:28 PM

## 2016-02-18 NOTE — Progress Notes (Signed)
Subjective: 1 Day Post-Op Procedure(s) (LRB): TOTAL KNEE ARTHROPLASTY (Left) Patient reports pain as mild.    Objective: Vital signs in last 24 hours: Temp:  [97.5 F (36.4 C)-99.4 F (37.4 C)] 99.4 F (37.4 C) (02/01 0415) Pulse Rate:  [68-96] 84 (02/01 0415) Resp:  [10-23] 18 (02/01 0415) BP: (133-163)/(70-97) 139/72 (02/01 0415) SpO2:  [92 %-100 %] 95 % (02/01 0415)  Intake/Output from previous day: 01/31 0701 - 02/01 0700 In: 3215 [P.O.:1080; I.V.:2035; IV Piggyback:100] Out: 1950 [Urine:1750; Blood:200] Intake/Output this shift: No intake/output data recorded.   Recent Labs  02/18/16 0329  HGB 12.1*    Recent Labs  02/18/16 0329  WBC 7.7  RBC 3.96*  HCT 36.2*  PLT 154    Recent Labs  02/18/16 0329  NA 133*  K 4.0  CL 100*  CO2 25  BUN 20  CREATININE 1.15  GLUCOSE 214*  CALCIUM 8.0*   No results for input(s): LABPT, INR in the last 72 hours.  Neurologically intact Neurovascular intact Sensation intact distally Intact pulses distally Dorsiflexion/Plantar flexion intact Incision: dressing C/D/I No cellulitis present Compartment soft  Assessment/Plan: 1 Day Post-Op Procedure(s) (LRB): TOTAL KNEE ARTHROPLASTY (Left) Advance diet Up with therapy D/C IV fluids Discharge home with home health after second session of PT as long as he feels ready WBAT LLE ABLA-mild and stable Please remove ace bandage and apply ted hose to LLE prior to d/c  Fannie Knee 02/18/2016, 7:26 AM

## 2016-02-18 NOTE — Progress Notes (Signed)
Physical Therapy Treatment Patient Details Name: Chad Yu MRN: [id number] DOB: Jun 08, 1950 Today's Date: 02/18/2016    History of Present Illness 66 y.o. male admitted to [facility] on 02/17/16 for elective L TKA.  Pt with significant PMHX of PVD, HTN, DM, CAD, R TKA (2005), L RTC repair, and back surgery.     PT Comments    Pt is POD #1 and this is his second session.  He walked further into the hallway, supervision overall, and we reviewed stair training.  He was placed back in CPM at the end of our session.  He will need to review pg 2 and 3 of his HEP in AM prior to d/c.  PT will continue to follow acutely.   Follow Up Recommendations  Home health PT;Supervision for mobility/OOB     Equipment Recommendations  None recommended by PT    Recommendations for Other Services   NA     Precautions / Restrictions Precautions Precautions: Knee Precaution Booklet Issued: Yes (comment) Precaution Comments: reviewed no pillow under knee Restrictions Weight Bearing Restrictions: Yes LLE Weight Bearing: Weight bearing as tolerated    Mobility  Bed Mobility Overal bed mobility: Needs Assistance Bed Mobility: Supine to Sit;Sit to Supine     Supine to sit: Supervision Sit to supine: Supervision   General bed mobility comments: HOB elevated and no rail.   Transfers Overall transfer level: Needs assistance Equipment used: Rolling walker (2 wheeled) Transfers: Sit to/from Stand Sit to Stand: Supervision         General transfer comment: Supervision for safety, reinforcement of safe hand placement.   Ambulation/Gait Ambulation/Gait assistance: Min guard Ambulation Distance (Feet): 100 Feet Assistive device: Rolling walker (2 wheeled) Gait Pattern/deviations: Step-to pattern;Antalgic;Trunk flexed Gait velocity: decreased Gait velocity interpretation: Below normal speed for age/gender General Gait Details: Pt with moderately antalgic gait pattern, verbal cues for upright  posture.  KI doneed before ambulating.    Stairs Stairs: Yes   Stair Management: One rail Left;Step to pattern;Forwards Number of Stairs: 2 General stair comments: Min guard assist for safety, pt demonstrated safe sequencing and hand placement.          Balance Overall balance assessment: Needs assistance Sitting-balance support: Feet supported;No upper extremity supported Sitting balance-Leahy Scale: Good Sitting balance - Comments: sitting EOB with no back support   Standing balance support: Bilateral upper extremity supported Standing balance-Leahy Scale: Poor Standing balance comment: standing for sink level ADL                    Cognition Arousal/Alertness: Awake/alert Behavior During Therapy: WFL for tasks assessed/performed Overall Cognitive Status: Within Functional Limits for tasks assessed                      Exercises Total Joint Exercises Ankle Circles/Pumps: AROM;Both;20 reps Quad Sets: AROM;Left;10 reps Towel Squeeze: AROM;Both;10 reps Heel Slides: AAROM;Left;10 reps Goniometric ROM: 5-70        Pertinent Vitals/Pain Pain Assessment: 0-10 Pain Score: 6  Pain Location: left knee Pain Descriptors / Indicators: Aching;Burning Pain Intervention(s): Limited activity within patient's tolerance;Monitored during session;Repositioned    Home Living Family/patient expects to be discharged to:: Private residence Living Arrangements: Spouse/significant other Available Help at Discharge: Family;Available PRN/intermittently Type of Home: House Home Access: Stairs to enter Entrance Stairs-Rails: Left Home Layout: One level Home Equipment: Walker - 2 wheels;Hand held shower head (3 in 1)      Prior Function Level of Independence: Independent  Comments: works at [employer] part time   PT Goals (current goals can now be found in the care plan section) Acute Rehab PT Goals Patient Stated Goal: get back to work  soon Progress towards PT goals: Progressing toward goals    Frequency    7X/week      PT Plan Current plan remains appropriate       End of Session Equipment Utilized During Treatment: Gait belt;Left knee immobilizer Activity Tolerance: Patient limited by pain Patient left: in chair;with call bell/phone within reach     Time: 1549-1618 PT Time Calculation (min) (ACUTE ONLY): 29 min  Charges:  $Gait Training: 8-22 mins $Therapeutic Activity: 8-22 mins                     Bennie Scaff B. [place], [place], DPT [phone]   02/18/2016, 4:50 PM

## 2016-02-18 NOTE — Progress Notes (Signed)
   02/17/16 1736  PT G-Codes **NOT FOR INPATIENT CLASS**  Functional Assessment Tool Used assist level  Functional Limitation Mobility: Walking and moving around  Mobility: Walking and Moving Around Current Status [phone]) CJ  Mobility: Walking and Moving Around Goal Status [id number]) CI  02/18/2016 late entry g-code Keithon Mccoin B. [place], [place], DPT [phone]

## 2016-02-18 NOTE — Op Note (Signed)
Chad Yu, Chad Yu NO.:  [id number]  MEDICAL RECORD NO.:  [id number]  LOCATION:                                 FACILITY:  PHYSICIAN:  Ninetta Lights, M.D. DATE OF BIRTH:  [birth year]  DATE OF PROCEDURE:  02/17/2016 DATE OF DISCHARGE:                              OPERATIVE REPORT   PREOPERATIVE DIAGNOSES:  Left knee end-stage arthritis, primary generalized.  Varus alignment.  Flexion contracture.  POSTOPERATIVE DIAGNOSES:  Left knee end-stage arthritis, primary generalized.  Varus alignment.  Flexion contracture.  PROCEDURE:  Left knee modified minimally invasive total knee replacement Stryker triathlon prosthesis.  Cemented pegged posterior stabilized #6 femoral component.  Cemented #6 tibial component and 9-mm PS insert. Cemented resurfacing 32-mm patellar component.  SURGEON:  Ninetta Lights, M.D.  ASSISTANT:  Elmyra Ricks, PA, present throughout the entire case and necessary for timely completion of procedure.  ANESTHESIA:  Attempted spinal followed by general.  SPECIMENS:  None.  CULTURES:  None.  COMPLICATIONS:  None.  DRESSINGS:  Soft compressive knee immobilizer.  TOURNIQUET TIME:  45 minutes.  DESCRIPTION OF PROCEDURE:  The patient was brought to the operating room and after adequate anesthesia had been obtained, tourniquet applied, prepped and draped in usual sterile fashion.  Exsanguinated with elevation of Esmarch.  Tourniquet inflated to 350 mmHg.  Straight incision above the patella down to tibial tubercle.  Skin and subcutaneous tissue divided.  Medial arthrotomy, vastus splitting, preserving quad tendon.  A flexible intramedullary guide distal femur. A 10-mm resection and 5 degrees of valgus.  Using epicondylar axis, the femur was sized, cut, and fitted for a posterior stabilized pegged #6 femoral component.  Proximal tibial resection with extramedullary guide. A 3-degree posterior slope cut.  Sized to #6 component,  rotation set with trials and hand reamed.  Patella exposed, spurs removed, posterior 10 mm removed.  A drill sized and fitted for a 32-mm component.  Copious irrigation with pulse irrigating device.  Cement prepared, placed on all components, firmly seated.  Polyethylene attached to tibia, knee reduced.  Patella held with a clamp.  Once the cement hardened, the knee was irrigated again.  Soft tissue was injected with Exparel.  Arthrotomy closed with #1 Vicryl with a subcutaneous and subcuticular closure.  Margins were injected with Marcaine.  Sterile compressive dressing applied.  Tourniquet deflated and removed.  Knee immobilizer applied.  Anesthesia reversed.  Brought to the recovery room.  Tolerated the surgery well.  No complications.     Ninetta Lights, M.D.   ______________________________ Ninetta Lights, M.D.    DFM/MEDQ  D:  02/17/2016  T:  02/18/2016  Job:  [id number]

## 2016-02-18 NOTE — Care Management Note (Signed)
Case Management Note  Patient Details  Name: Elaine Basore MRN: [id number] Date of Birth: [birth year]  Subjective/Objective:   66 yr old gentleman s/p left total knee arthroplasty.                 Action/Plan: Case manager spoke with patient concerning [place] and DME needs. Choice was offered for [facility], shortly after CM received notification that patient has been setup with [facility]. Patient states he has a rolling walker at home and that he ahs a 3in1 that his family will get out of the storage shed. He will have family support at discharge.    Expected Discharge Date:  02/18/16               Expected Discharge Plan:  [place]  In-House Referral:  NA  Discharge planning Services  CM Consult  Post Acute Care Choice:  Home Health Choice offered to:  Patient  DME Arranged:   (patient states he has rolling walker and 3in1 ) DME Agency:  NA  HH Arranged:  PT HH Agency:  [facility]  Status of Service:  Completed, signed off  If discussed at [employer], dates discussed:    Additional Comments:  Ninfa Meeker, RN 02/18/2016, 2:47 PM

## 2016-02-18 NOTE — Care Management CC44 (Signed)
Condition Code 44 Documentation Completed  Patient Details  Name: Eyden Schey MRN: [id number] Date of Birth: 04/21/1950   Condition Code 44 given:  Yes Patient signature on Condition Code 44 notice:  Yes Documentation of 2 MD's agreement:  Yes Code 44 added to claim:  Yes    Ninfa Meeker, RN 02/18/2016, 2:28 PM

## 2016-02-18 NOTE — Progress Notes (Signed)
Orthopedic Tech Progress Note Patient Details:  Chad Yu [birth year] [id number]  CPM Left Knee CPM Left Knee: On Left Knee Flexion (Degrees): 60 Left Knee Extension (Degrees): 0 Additional Comments: trapeze bar patient helper   [name] 02/18/2016, 4:39 PM

## 2016-02-19 DIAGNOSIS — M1712 Unilateral primary osteoarthritis, left knee: Secondary | ICD-10-CM | POA: Diagnosis not present

## 2016-02-19 LAB — BASIC METABOLIC PANEL
Anion gap: 9 (ref 5–15)
BUN: 12 mg/dL (ref 6–20)
CALCIUM: 8.3 mg/dL — AB (ref 8.9–10.3)
CO2: 25 mmol/L (ref 22–32)
CREATININE: 1.06 mg/dL (ref 0.61–1.24)
Chloride: 100 mmol/L — ABNORMAL LOW (ref 101–111)
GFR calc Af Amer: >60 mL/min (ref >60)
GLUCOSE: 242 mg/dL — AB (ref 65–99)
Potassium: 3.7 mmol/L (ref 3.5–5.1)
SODIUM: 134 mmol/L — AB (ref 135–145)

## 2016-02-19 LAB — CBC
HCT: 32.9 % — ABNORMAL LOW (ref 39.0–52.0)
Hemoglobin: 11.2 g/dL — ABNORMAL LOW (ref 13.0–17.0)
MCH: 30.7 pg (ref 26.0–34.0)
MCHC: 34 g/dL (ref 30.0–36.0)
MCV: 90.1 fL (ref 78.0–100.0)
PLATELETS: 142 10*3/uL — AB (ref 150–400)
RBC: 3.65 MIL/uL — ABNORMAL LOW (ref 4.22–5.81)
RDW: 13.1 % (ref 11.5–15.5)
WBC: 6.8 10*3/uL (ref 4.0–10.5)

## 2016-02-19 LAB — GLUCOSE, CAPILLARY
GLUCOSE-CAPILLARY: 203 mg/dL — AB (ref 65–99)
Glucose-Capillary: 245 mg/dL — ABNORMAL HIGH (ref 65–99)

## 2016-02-19 NOTE — Progress Notes (Signed)
Physical Therapy Treatment Patient Details Name: Chad Yu MRN: [id number] DOB: 1950/05/26 Today's Date: 02/19/2016    History of Present Illness 66 y.o. male admitted to [facility] on 02/17/16 for elective L TKA.  Pt with significant PMHX of PVD, HTN, DM, CAD, R TKA (2005), L RTC repair, and back surgery.     PT Comments    Patient is making good progress with PT.  From a mobility standpoint anticipate patient will be ready for DC home when medically ready.     Follow Up Recommendations  Home health PT;Supervision for mobility/OOB     Equipment Recommendations  None recommended by PT    Recommendations for Other Services       Precautions / Restrictions Precautions Precautions: Knee Precaution Booklet Issued: Yes (comment) Precaution Comments: reviewed no pillow under knee Restrictions Weight Bearing Restrictions: Yes LLE Weight Bearing: Weight bearing as tolerated    Mobility  Bed Mobility               General bed mobility comments: pt OOB in chair upon arrival  Transfers Overall transfer level: Needs assistance Equipment used: Rolling walker (2 wheeled) Transfers: Sit to/from Stand Sit to Stand: Supervision         General transfer comment: Supervision for safety, reinforcement of safe hand placement.   Ambulation/Gait Ambulation/Gait assistance: Supervision Ambulation Distance (Feet): 180 Feet Assistive device: Rolling walker (2 wheeled) Gait Pattern/deviations: Trunk flexed;Step-through pattern;Decreased stance time - left;Decreased step length - right;Decreased stride length;Decreased weight shift to left Gait velocity: decreased   General Gait Details: cues for posture and sequencing of gait with use of AD, proximity of RW   Stairs     Stair Management: One rail Left;Step to pattern;Forwards Number of Stairs: 2 General stair comments: Min guard assist for safety, pt demonstrated safe sequencing and hand placement.   Wheelchair Mobility     Modified Rankin (Stroke Patients Only)       Balance Overall balance assessment: Needs assistance Sitting-balance support: Feet supported;No upper extremity supported Sitting balance-Leahy Scale: Good     Standing balance support: Bilateral upper extremity supported Standing balance-Leahy Scale: Poor                      Cognition Arousal/Alertness: Awake/alert Behavior During Therapy: WFL for tasks assessed/performed Overall Cognitive Status: Within Functional Limits for tasks assessed                      Exercises Total Joint Exercises Quad Sets: AROM;Left;10 reps Short Arc Quad: AROM;Left;10 reps Heel Slides: AROM;Left;10 reps Hip ABduction/ADduction: AROM;Left;10 reps Straight Leg Raises: AROM;Left;10 reps Long Arc Quad: AROM;Left;10 reps Knee Flexion: AROM;Left;5 reps;Seated;Other (comment) (10 sec holds) Goniometric ROM: 5-85    General Comments        Pertinent Vitals/Pain Pain Assessment: Faces Faces Pain Scale: Hurts little more Pain Location: left knee Pain Descriptors / Indicators: Sore Pain Intervention(s): Limited activity within patient's tolerance;Monitored during session;Premedicated before session;Repositioned    Home Living                      Prior Function            PT Goals (current goals can now be found in the care plan section) Acute Rehab PT Goals Patient Stated Goal: get back to work soon Progress towards PT goals: Progressing toward goals    Frequency    7X/week      PT Plan Current plan remains  appropriate    Co-evaluation             End of Session Equipment Utilized During Treatment: Gait belt Activity Tolerance: Patient tolerated treatment well Patient left: in chair;with call bell/phone within reach;with family/visitor present     Time: 0842-0909 PT Time Calculation (min) (ACUTE ONLY): 27 min  Charges:  $Gait Training: 8-22 mins $Therapeutic Exercise: 8-22 mins                     G Codes:      Salina April, PTA Pager: [phone]   02/19/2016, 10:08 AM

## 2016-02-19 NOTE — Progress Notes (Signed)
Reviewed discharge/medications with patient.  Answered his questions.  Patient is ready for discharge.

## 2016-02-19 NOTE — Progress Notes (Signed)
Subjective: 2 Days Post-Op Procedure(s) (LRB): TOTAL KNEE ARTHROPLASTY (Left) Patient reports pain as mild.    Objective: Vital signs in last 24 hours: Temp:  [98.3 F (36.8 C)-99.7 F (37.6 C)] 98.7 F (37.1 C) (02/02 0525) Pulse Rate:  [81-91] 81 (02/02 0525) Resp:  [17-18] 17 (02/01 2100) BP: (128-149)/(73-77) 128/73 (02/02 0525) SpO2:  [94 %] 94 % (02/02 0525)  Intake/Output from previous day: 02/01 0701 - 02/02 0700 In: 720 [P.O.:720] Out: 1200 [Urine:1200] Intake/Output this shift: No intake/output data recorded.   Recent Labs  02/18/16 0329 02/19/16 0534  HGB 12.1* 11.2*    Recent Labs  02/18/16 0329 02/19/16 0534  WBC 7.7 6.8  RBC 3.96* 3.65*  HCT 36.2* 32.9*  PLT 154 142*    Recent Labs  02/18/16 0329 02/19/16 0534  NA 133* 134*  K 4.0 3.7  CL 100* 100*  CO2 25 25  BUN 20 12  CREATININE 1.15 1.06  GLUCOSE 214* 242*  CALCIUM 8.0* 8.3*   No results for input(s): LABPT, INR in the last 72 hours.  Neurologically intact Neurovascular intact Sensation intact distally Intact pulses distally Dorsiflexion/Plantar flexion intact Incision: dressing C/D/I No cellulitis present Compartment soft  Assessment/Plan: 2 Days Post-Op Procedure(s) (LRB): TOTAL KNEE ARTHROPLASTY (Left) Advance diet Up with therapy Discharge home with home health after PT session WBAT LLE ABLA-mild and stable  Fannie Knee 02/19/2016, 8:25 AM

## 2016-02-19 NOTE — Progress Notes (Addendum)
Inpatient Diabetes Program Recommendations  AACE/ADA: New Consensus Statement on Inpatient Glycemic Control (2015)  Target Ranges:  Prepandial:   less than 140 mg/dL      Peak postprandial:   less than 180 mg/dL (1-2 hours)      Critically ill patients:  140 - 180 mg/dL   Lab Results  Component Value Date   GLUCAP 203 (H) 02/19/2016   HGBA1C 8.7 (H) 02/17/2016   Results for LEMARCUS, CHRISTINE (MRN [id number]) as of 02/19/2016 10:10  Ref. Range 02/18/2016 11:25 02/18/2016 12:34 02/18/2016 16:52 02/18/2016 22:38 02/19/2016 06:49  Glucose-Capillary Latest Ref Range: 65 - 99 mg/dL 245 (H) 193 (H) 213 (H) 150 (H) 203 (H)   Review of Glycemic Control  Diabetes history: DM2, Obesity  Outpatient Diabetes medications: Amaryl 2 mg daily, Lantus 56 units daily Current orders for Inpatient glycemic control: Sensitive correction scale Novolog 0-9 units TIDAC and 0-5 units QHS, Amaryl 2 mg daily in morning  Inpatient Diabetes Program Recommendations:    Noted that patient currently not receiving Lantus inpatient.      Please consider discharging patient on a smaller dose of Lantus.    Recommend follow up with PCP for DM management.    Thank you,  Windy Carina, RN, MSN Diabetes Coordinator Inpatient Diabetes Program [phone] (Team Pager)

## 2016-04-11 ENCOUNTER — Telehealth: Payer: Self-pay | Admitting: *Deleted

## 2016-04-11 NOTE — Telephone Encounter (Signed)
Surgical clearance form received in office from [facility]. I spoke with pt. He reports he is not planning on surgery until this fall.  I scheduled him for 6 month follow up on May 24,2018.   He reports primary care doctor is trying to get coverage for injectable cholesterol medication  but it has been denied.  I told him pharmacist in our office may be able to assist with this.  He will discuss with Dr. [name] at appt in May.

## 2016-05-24 ENCOUNTER — Encounter: Payer: Self-pay | Admitting: Cardiovascular Disease

## 2016-06-09 ENCOUNTER — Ambulatory Visit: Payer: Medicare Other | Admitting: Cardiovascular Disease

## 2016-09-07 ENCOUNTER — Telehealth: Payer: Self-pay | Admitting: Cardiovascular Disease

## 2016-09-07 NOTE — Telephone Encounter (Signed)
I spoke with Vaughan Basta at [facility]. Pt is being seen there for a research study for pt's intolerant to statins.  Vaughan Basta is asking if our records show what dose of Atorvastatin pt has tried.  Chart reviewed and first records from cardiology are from 2009 and pt was intolerant to Atorvastatin.  Vaughan Basta will try and follow up with pt's primary care provider from that time

## 2016-09-07 NOTE — Telephone Encounter (Signed)
New message   Pt c/o medication issue:  1. Name of Medication: ATORVISTATIN  2. How are you currently taking this medication (dosage and times per day)? UNSURE  3. Are you having a reaction (difficulty breathing--STAT)? no  4. What is your medication issue? Gaynelle Arabian from [facility] is calling because they need to know the dosage of this patient's atorvistatin. [place] requests a call back

## 2016-10-03 ENCOUNTER — Ambulatory Visit: Payer: Medicare Other | Admitting: Cardiovascular Disease

## 2016-10-03 NOTE — Progress Notes (Deleted)
History of Present Illness: 66 yo male with history of DM, HTN, HLD, CAD here today for cardiac follow up. Last cath in August 2012 per Dr. [name] and at that time he had a subtotal occlusion of the Diagonal branch that could not be opened. The Diagonal branch had an old Taxus stent. He is intolerant to statins. Nuclear stress test January 2017 with no ischemia.   He is here today for follow up of his CAD. The patient denies any chest pain, dyspnea, palpitations, lower extremity edema, orthopnea, PND, dizziness, near syncope or syncope.     Primary Care Physician: [name], MD   Past Medical History:  Diagnosis Date  . Coronary artery disease   . Degenerative joint disease   . Diabetes mellitus   . Hyperlipidemia   . Hypertension   . Peripheral vascular disease [facility])     Past Surgical History:  Procedure Laterality Date  . APPENDECTOMY    . BACK SURGERY    . COLONOSCOPY  2012  . CORONARY ANGIOPLASTY WITH STENT PLACEMENT    . JOINT REPLACEMENT    . left rotator cuff repair    . post varicose surgery-1995    . right knee replacement  in 2005    . TONSILLECTOMY    . TOTAL KNEE ARTHROPLASTY Left 02/17/2016  . TOTAL KNEE ARTHROPLASTY Left 02/17/2016   Procedure: TOTAL KNEE ARTHROPLASTY;  Surgeon: Ninetta Lights, MD;  Location: [place];  Service: Orthopedics;  Laterality: Left;    Current Outpatient Prescriptions  Medication Sig Dispense Refill  . apixaban (ELIQUIS) 2.5 MG TABS tablet Take 1 tab po q12 hours x 14 days following surgery to prevent blood clots 28 tablet 0  . glimepiride (AMARYL) 2 MG tablet Take 2 mg by mouth daily with breakfast.    . insulin glargine (LANTUS) 100 UNIT/ML injection Inject 56 Units into the skin at bedtime.     . ondansetron (ZOFRAN) 4 MG tablet Take 1 tablet (4 mg total) by mouth every 8 (eight) hours as needed for nausea or vomiting. 40 tablet 0  . oxyCODONE-acetaminophen (PERCOCET) 5-325 MG tablet Take 1-2 tablets by mouth every 4 (four)  hours as needed for severe pain. 60 tablet 0  . Potassium (POTASSIMIN PO) Take 3 tablets by mouth at bedtime.    . vitamin E 1000 UNIT capsule Take 1,000 Units by mouth daily.     No current facility-administered medications for this visit.     Allergies  Allergen Reactions  . Lisinopril Other (See Comments)    Severe cough Hair ball in throat  . Statins Hives and Other (See Comments)    Other reaction(s): Cough (ALLERGY/intolerance), Myalgias (intolerance) No cholesterol meds  . Atorvastatin Other (See Comments)    REACTION: myalgies  . Rosuvastatin Itching    Social History   Social History  . Marital status: Married    Spouse name: N/A  . Number of children: 4  . Years of education: N/A   Occupational History  . 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 Smoking status: Never Smoker  . Smokeless tobacco: Never Used  . Alcohol use No  . Drug use: No  . Sexual activity: Not on file   Other Topics Concern  . Not on file   Social History Narrative   The patient lives in [place] with his wife. He works in Leisure centre manager. He denies tobacco or alcohol abuse . He denies regular exercise.  Family History  Problem Relation Age of Onset  . Heart attack Father   . Hypertension Father   . Heart attack Brother     Review of Systems:  As stated in the HPI and otherwise negative.   There were no vitals taken for this visit.  Physical Examination:  General: Well developed, well nourished, NAD  HEENT: OP clear, mucus membranes moist  SKIN: warm, dry. No rashes. Neuro: No focal deficits  Musculoskeletal: Muscle strength 5/5 all ext  Psychiatric: Mood and affect normal  Neck: No JVD, no carotid bruits, no thyromegaly, no lymphadenopathy.  Lungs:Clear bilaterally, no wheezes, rhonci, crackles Cardiovascular: Regular rate and rhythm. No murmurs, gallops or rubs. Abdomen:Soft. Bowel sounds present. Non-tender.    Extremities: No lower extremity edema. Pulses are 2 + in the bilateral DP/PT.  EKG:  EKG {ACTION; IS/IS [id number]} ordered today. The ekg ordered today demonstrates ***  Recent Labs: 02/19/2016: BUN 12; Creatinine, Ser 1.06; Hemoglobin 11.2; Platelets 142; Potassium 3.7; Sodium 134   Lipid Panel    Component Value Date/Time   CHOL 193 12/16/2010 0540   TRIG 323 (H) 12/16/2010 0540   HDL 34 (L) 12/16/2010 0540   CHOLHDL 5.7 12/16/2010 0540   VLDL 65 (H) 12/16/2010 0540   LDLCALC 94 12/16/2010 0540     Wt Readings from Last 3 Encounters:  02/17/16 247 lb (112 kg)  02/03/16 247 lb 8 oz (112.3 kg)  12/09/15 245 lb 12.8 oz (111.5 kg)     Other studies Reviewed: Additional studies/ records that were reviewed today include: ***. Review of the above records demonstrates: ***  Assessment and Plan:   1. CAD without angina: He has no chest pain suggestive of angina. He is currently on ***. He is intolerant of statins. Last LDL ***. He is not on a beta blocker due to bradycardia. .   2. HTN: BP is ***  3. HLD: Lipids followed in primary care. He has not tolerated statins. Last LDL *** He has been on a PCSK9 inh.   Current medicines are reviewed at length with the patient today.  The patient {ACTIONS; HAS/DOES NOT HAVE:19233} concerns regarding medicines.  The following changes have been made:  {PLAN; NO CHANGE:13088:s}  Labs/ tests ordered today include: *** No orders of the defined types were placed in this encounter.    Disposition:   FU with *** in {gen number [id number]} {TIME; UNITS DAY/WEEK/MONTH:19136}   Signed, Lauree Chandler, MD 10/03/2016 5:59 AM    [facility] [address], [place], [place]  [address] Phone: [phone]; Fax: [phone]

## 2016-10-18 ENCOUNTER — Ambulatory Visit (INDEPENDENT_AMBULATORY_CARE_PROVIDER_SITE_OTHER): Payer: Medicare Other | Admitting: Cardiology

## 2016-10-18 ENCOUNTER — Encounter: Payer: Self-pay | Admitting: Cardiology

## 2016-10-18 VITALS — BP 150/80 | HR 74 | Ht 72.0 in | Wt 239.8 lb

## 2016-10-18 DIAGNOSIS — Z01810 Encounter for preprocedural cardiovascular examination: Secondary | ICD-10-CM

## 2016-10-18 DIAGNOSIS — I2510 Atherosclerotic heart disease of native coronary artery without angina pectoris: Secondary | ICD-10-CM | POA: Diagnosis not present

## 2016-10-18 NOTE — Patient Instructions (Addendum)
Medication Instructions:  Your physician recommends that you continue on your current medications as directed. Please refer to the Current Medication list given to you today.  Labwork: NONE  Testing/Procedures: NONE  Follow-Up: Your physician wants you to follow-up in: 6 months with Dr. [name]. You will receive a reminder letter in the mail two months in advance. If you don't receive a letter, please call our office to schedule the follow-up appointment.   If you need a refill on your cardiac medications before your next appointment, please call your pharmacy.

## 2016-10-18 NOTE — Progress Notes (Signed)
10/18/2016 Chad Yu   27-Jan-1950  [id number]  Primary Physician [name], MD Primary Cardiologist: Dr. [name]   Reason for Visit/CC: Pre-operative Clearance  HPI:  Chad Yu is a 66 y.o. male , followed by Dr. [name], who presents to clinic today for surgical clearance. He is needing to undergo orthopedic surgery, left arthoscopic shoulder surgery for bone spurs, with Dr. Percell Miller.   He has known cardiac history. H/o CAD, HTN, HLD and DM. He was followed by Dr. Verl Blalock in the past but is now followed by Dr. [name]. Last cath was in August 2012, performed by Dr. [name]. The previously stented diagonal branch had a patent Taxus stent but there was a sub-total occlusion prior to the stent in the diagonal branch that was unable to be opened. He was placed on medical therapy and did well. He is intolerant to statins, but has had success with PCSK9 (Praluent). This is followed by his PCP Dr. Nancy Fetter. He refuses to take BP meds. He has had reactions to lisinopril in the past. No BBs given borderline bradycardia. He denies tobacco use.   He was evaluated last year for surgical clearance for TKR. He had a NST 01/2015 that was negative for ischemia. He was cleared for surgery and did well w/o cardiac complications. Today in clinic, he reports that he has done well. He denies any exertional CP or dyspnea. He is able to complete 4 METs of physical activity (walk a flight of stairs, walk 4 blocks) w/o exertional symptoms. EKG shows NSR. No ischemic abnormalities. Physical exam is benign.    Current Meds  Medication Sig  . glimepiride (AMARYL) 2 MG tablet Take 2 mg by mouth daily with breakfast.  . insulin glargine (LANTUS) 100 UNIT/ML injection Inject 56 Units into the skin at bedtime.   Marland Kitchen oxyCODONE-acetaminophen (PERCOCET) 5-325 MG tablet Take 1-2 tablets by mouth every 4 (four) hours as needed for severe pain.  Marland Kitchen Potassium (POTASSIMIN PO) Take 3 tablets by mouth at bedtime.  . vitamin E 1000  UNIT capsule Take 1,000 Units by mouth daily.   Allergies  Allergen Reactions  . Lisinopril Other (See Comments)    Severe cough Hair ball in throat  . Statins Hives and Other (See Comments)    Other reaction(s): Cough (ALLERGY/intolerance), Myalgias (intolerance) No cholesterol meds  . Atorvastatin Other (See Comments)    REACTION: myalgies  . Rosuvastatin Itching   Past Medical History:  Diagnosis Date  . Coronary artery disease   . Degenerative joint disease   . Diabetes mellitus   . Hyperlipidemia   . Hypertension   . Peripheral vascular disease ([place])    Family History  Problem Relation Age of Onset  . Heart attack Father   . Hypertension Father   . Heart attack Brother    Past Surgical History:  Procedure Laterality Date  . APPENDECTOMY    . BACK SURGERY    . COLONOSCOPY  2012  . CORONARY ANGIOPLASTY WITH STENT PLACEMENT    . JOINT REPLACEMENT    . left rotator cuff repair    . post varicose surgery-1995    . right knee replacement  in 2005    . TONSILLECTOMY    . TOTAL KNEE ARTHROPLASTY Left 02/17/2016  . TOTAL KNEE ARTHROPLASTY Left 02/17/2016   Procedure: TOTAL KNEE ARTHROPLASTY;  Surgeon: Ninetta Lights, MD;  Location: [place];  Service: Orthopedics;  Laterality: Left;   Social History   Social History  . Marital status: Married  Spouse name: N/A  . Number of children: 4  . Years of education: N/A   Occupational History  . 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 Smoking status: Never Smoker  . Smokeless tobacco: Never Used  . Alcohol use No  . Drug use: No  . Sexual activity: Not on file   Other Topics Concern  . Not on file   Social History Narrative   The patient lives in [place] with his wife. He works in Leisure centre manager. He denies tobacco or alcohol abuse . He denies regular exercise.     Review of Systems: General: negative for chills, fever, night sweats or weight changes.    Cardiovascular: negative for chest pain, dyspnea on exertion, edema, orthopnea, palpitations, paroxysmal nocturnal dyspnea or shortness of breath Dermatological: negative for rash Respiratory: negative for cough or wheezing Urologic: negative for hematuria Abdominal: negative for nausea, vomiting, diarrhea, bright red blood per rectum, melena, or hematemesis Neurologic: negative for visual changes, syncope, or dizziness All other systems reviewed and are otherwise negative except as noted above.   Physical Exam:  Blood pressure (!) 150/80, pulse 74, height 6' (1.829 m), weight 239 lb 12.8 oz (108.8 kg), SpO2 95 %.  General appearance: alert, cooperative and no distress Neck: no carotid bruit and no JVD Lungs: clear to auscultation bilaterally Heart: regular rate and rhythm, S1, S2 normal, no murmur, click, rub or gallop Extremities: extremities normal, atraumatic, no cyanosis or edema Pulses: 2+ and symmetric Skin: Skin color, texture, turgor normal. No rashes or lesions Neurologic: Grossly normal  EKG NSR no ischemic abnormalites -- personally reviewed   ASSESSMENT AND PLAN:   1. CAD: stable w/o anginal symptoms. No exertional CP or dyspnea. NST 01/2015 low risk. EKG NSR w/o ischemic abnormalities. Continue medical therapy.   2. HLD: intolerant to statins. PCP has him on PCSK9 inhibitor therapy. Tolerating well. Pt reports significant reduction in LDL. PCP to continue to follow.   3. HTN: mildly elevated but pt refuses medications, despite my recommendations for medical management for risk factor reduction. We discussed exercise for weight loss and low sodium diet. Pt encouraged to monitor at home.   4. Pre-operative Assessment: Pt denies any exertional CP or dyspnea. He is able to complete 4 METs of physical activity (walk a flight of stairs, walk 4 blocks) w/o exertional symptoms. EKG shows NSR. No ischemic abnormalities. Physical exam is benign. He is scheduled for a low risk  surgery. Based on assessment, there is no indication for further cardiac testing prior to surgery. He can be cleared for surgery. He is of acceptable risk.    Follow-Up w/ Dr. [name] in 6 months.   Ladashia Demarinis Ladoris Gene, MHS [facility] 10/18/2016 2:41 PM

## 2016-10-20 ENCOUNTER — Ambulatory Visit: Payer: Medicare Other | Admitting: Cardiovascular Disease

## 2016-10-20 ENCOUNTER — Ambulatory Visit: Payer: Medicare Other | Admitting: Cardiology

## 2017-01-17 HISTORY — PX: SHOULDER ARTHROSCOPY: SHX128

## 2017-03-16 ENCOUNTER — Telehealth: Payer: Self-pay

## 2017-03-16 NOTE — Telephone Encounter (Signed)
   [facility] Pre-operative Risk Assessment    Request for surgical clearance:  1. What type of surgery is being performed? Right shoulder scope   2. When is this surgery scheduled? 04/06/2017   3. What type of clearance is required (medical clearance vs. Pharmacy clearance to hold med vs. Both)? Medical  4. Are there any medications that need to be held prior to surgery and how long? None Specified   5. Practice name and name of physician performing surgery? Dr. Mardelle Matte [facility])   6. What is your office phone and fax number?  1. Phone: [phone] 2. Fax: [phone]   7. Anesthesia type (None, local, MAC, general) ? Not specified  **Please send most recent notes, labs, EKGs, or special studies back with clearance**

## 2017-03-16 NOTE — Telephone Encounter (Signed)
   Primary Cardiologist: No primary care provider on file.  Chart reviewed as part of pre-operative protocol coverage. Given past medical history and time since last visit, based on ACC/AHA guidelines, Chad Yu would be at acceptable risk for the planned procedure without further cardiovascular testing. He is able to achieve > 4 mets of activity.   I will route this recommendation to the requesting party via Epic fax function and remove from pre-op pool.  Please call with questions.  [place], [place] 03/16/2017, 3:44 PM

## 2017-03-22 ENCOUNTER — Telehealth: Payer: Self-pay | Admitting: Cardiovascular Disease

## 2017-03-22 NOTE — Telephone Encounter (Signed)
New message     Chad Yu has not received pre op clearance letter.   Practice name and name of physician performing surgery? Dr. Mardelle Matte (Chad Yu)   What is your office phone and fax number?  1. Phone: [phone] 2. Fax: [phone]

## 2017-03-22 NOTE — Telephone Encounter (Signed)
Refaxed pts clearance over to [facility], [phone].

## 2017-04-02 ENCOUNTER — Encounter: Payer: Self-pay | Admitting: Physician Assistant

## 2017-04-02 NOTE — Progress Notes (Deleted)
Cardiology Office Note    Date:  04/02/2017  ID:  Chad Yu, DOB February 21, 1950, MRN [id number] PCP:  [name], MD  Cardiologist:  Dr. [name]    History of Present Illness:  Chad Yu is a 67 y.o. male with history of CAD (prior PCI), HTN (refuses to take blood pressure medication), HLD, obesity, and DM who presents for 6 month follow-up. He has history of prior PCI. Last cath was in August 2012 which showed the previously stented diagonal branch had a patent Taxus stent but there was a sub-total occlusion prior to the stent in the diagonal branch that was unable to be opened. He was placed on medical therapy and has done well. Nuclear stress test 01/2015 was normal, EF 62%. He is intolerant to statins, but has had success with PCSK9 (Praluent). This is followed by his PCP Dr. Nancy Yu. He refuses to take BP meds. He has had reactions to lisinopril in the past. No BBs given borderline bradycardia. Last labs showed Hgb 14.6, Plt 178, Na 138, K 4.4, Cr 1.17, glucose 232, normal LFTs,  LDL 102, HDL 31.  ldl too high pvd?  CAD Essential HTN Hyperlipidemia H/o bradycardia   Past Medical History:  Diagnosis Date  . Coronary artery disease    a. prior stenting.  . Degenerative joint disease   . Diabetes mellitus   . Hyperlipidemia   . Hypertension   . Obesity   . Peripheral vascular disease [facility])     Past Surgical History:  Procedure Laterality Date  . APPENDECTOMY    . BACK SURGERY    . COLONOSCOPY  2012  . CORONARY ANGIOPLASTY WITH STENT PLACEMENT    . JOINT REPLACEMENT    . left rotator cuff repair    . post varicose surgery-1995    . right knee replacement  in 2005    . TONSILLECTOMY    . TOTAL KNEE ARTHROPLASTY Left 02/17/2016  . TOTAL KNEE ARTHROPLASTY Left 02/17/2016   Procedure: TOTAL KNEE ARTHROPLASTY;  Surgeon: Ninetta Lights, MD;  Location: [place];  Service: Orthopedics;  Laterality: Left;    Current Medications: No outpatient medications have been marked as  taking for the 04/03/17 encounter (Appointment) with [name], PA-C.   ***   Allergies:   Lisinopril; Statins; Atorvastatin; and Rosuvastatin   Social History   Socioeconomic History  . Marital status: Married    Spouse name: Not on file  . Number of children: 4  . Years of education: Not on file  . Highest education level: Not on file  Social Needs  . Financial resource strain: Not on file  . Food insecurity - worry: Not on file  . Food insecurity - inability: Not on file  . Transportation needs - medical: Not on file  . Transportation needs - non-medical: Not on file  Occupational History  . Occupation: [employer]    Employer: UNEMPLOYED  Tobacco Use  . Smoking status: Never Smoker  . Smokeless tobacco: Never Used  Substance and Sexual Activity  . Alcohol use: No  . Drug use: No  . Sexual activity: Not on file  Other Topics Concern  . Not on file  Social History Narrative   The patient lives in [place] with his wife. He works in Leisure centre manager. He denies tobacco or alcohol abuse . He denies regular exercise.     Family History:  Family History  Problem Relation Age of Onset  . Heart attack Father   .  Hypertension Father   . Heart attack Brother    ***  ROS:   Please see the history of present illness. Otherwise, review of systems is positive for ***.  All other systems are reviewed and otherwise negative.    PHYSICAL EXAM:   VS:  There were no vitals taken for this visit.  BMI: There is no height or weight on file to calculate BMI. GEN: Well nourished, well developed, in no acute distress  HEENT: normocephalic, atraumatic Neck: no JVD, carotid bruits, or masses Cardiac: ***RRR; no murmurs, rubs, or gallops, no edema  Respiratory:  clear to auscultation bilaterally, normal work of breathing GI: soft, nontender, nondistended, + BS MS: no deformity or atrophy  Skin: warm and dry, no rash Neuro:  Alert and Oriented x  3, Strength and sensation are intact, follows commands Psych: euthymic mood, full affect  Wt Readings from Last 3 Encounters:  10/18/16 239 lb 12.8 oz (108.8 kg)  02/17/16 247 lb (112 kg)  02/03/16 247 lb 8 oz (112.3 kg)      Studies/Labs Reviewed:   EKG:  EKG was ordered today and personally reviewed by me and demonstrates *** EKG was not ordered today.***  Recent Labs: No results found for requested labs within last 8760 hours.   Lipid Panel    Component Value Date/Time   CHOL 193 12/16/2010 0540   TRIG 323 (H) 12/16/2010 0540   HDL 34 (L) 12/16/2010 0540   CHOLHDL 5.7 12/16/2010 0540   VLDL 65 (H) 12/16/2010 0540   LDLCALC 94 12/16/2010 0540    Additional studies/ records that were reviewed today include: Summarized above.***    ASSESSMENT & PLAN:   1. ***  Disposition: F/u with ***   Medication Adjustments/Labs and Tests Ordered: Current medicines are reviewed at length with the patient today.  Concerns regarding medicines are outlined above. Medication changes, Labs and Tests ordered today are summarized above and listed in the Patient Instructions accessible in Encounters.   Signed, [name], PA-C  04/02/2017 11:29 AM    [address], [place], [place]  [address] Phone: [phone]; Fax: [phone]

## 2017-04-03 ENCOUNTER — Ambulatory Visit: Payer: Medicare Other | Admitting: Physician Assistant

## 2017-04-03 DIAGNOSIS — R0989 Other specified symptoms and signs involving the circulatory and respiratory systems: Secondary | ICD-10-CM

## 2017-04-04 ENCOUNTER — Encounter: Payer: Self-pay | Admitting: Physician Assistant

## 2018-06-22 ENCOUNTER — Other Ambulatory Visit: Payer: Self-pay | Admitting: Urology

## 2018-06-25 ENCOUNTER — Other Ambulatory Visit: Payer: Self-pay | Admitting: Urology

## 2018-08-02 ENCOUNTER — Other Ambulatory Visit: Payer: Self-pay

## 2018-08-02 ENCOUNTER — Other Ambulatory Visit (HOSPITAL_COMMUNITY)
Admission: RE | Admit: 2018-08-02 | Discharge: 2018-08-02 | Disposition: A | Discharge status: Home / Self Care | Payer: Medicare Other | Source: Ambulatory Visit | Attending: Urology | Admitting: Urology

## 2018-08-02 ENCOUNTER — Encounter (HOSPITAL_BASED_OUTPATIENT_CLINIC_OR_DEPARTMENT_OTHER): Payer: Self-pay | Admitting: *Deleted

## 2018-08-02 DIAGNOSIS — Z1159 Encounter for screening for other viral diseases: Secondary | ICD-10-CM | POA: Diagnosis present

## 2018-08-02 LAB — SARS CORONAVIRUS 2 (TAT 6-24 HRS): SARS Coronavirus 2: NEGATIVE

## 2018-08-02 NOTE — Progress Notes (Signed)
   08/02/18 1718  OBSTRUCTIVE SLEEP APNEA  Have you ever been diagnosed with sleep apnea through a sleep study? No  Do you snore loudly (loud enough to be heard through closed doors)?  1  Do you often feel tired, fatigued, or sleepy during the daytime (such as falling asleep during driving or talking to someone)? 0  Has anyone observed you stop breathing during your sleep? 1  Do you have, or are you being treated for high blood pressure? 1  BMI more than 35 kg/m2? 0  Age > 38 (1-yes) 1  Male Gender (Yes=1) 1  Obstructive Sleep Apnea Score 5  Score 5 or greater  Results sent to PCP

## 2018-08-02 NOTE — Progress Notes (Addendum)
Spoke w/ pt via phone for pre-op interview.  Npo after mn.  Arrive at [facility].  Needs istat 8 and ekg.  Pt had covid test done today.  Pt to call back with medication list.  Addendum:  Pt called back via phone with medication list.  Updated list in epic.  Pt verbalized understanding do not take any medication morning of surgery and night before surgery do half dose lantus insulin. Chart to be reviewed by anesthesia, Willeen Cass NP.  ADDENDUM:  Willeen Cass NP reviewed chart with dr germeroth mda, stated pt will need cardiac clearance before having surgery.  Called and spoke w/ Coni, or scheduler for dr Karsten Ro, informed her that she will need to call pt cardiology for clearance.

## 2018-08-02 NOTE — Progress Notes (Signed)

## 2018-08-03 ENCOUNTER — Encounter (HOSPITAL_COMMUNITY): Payer: Self-pay | Admitting: Emergency Medicine

## 2018-08-03 NOTE — Progress Notes (Signed)
Anesthesia Chart Review:   Case: [id number] Date/Time: 08/06/18 1000   Procedure: TRANSURETHRAL RESECTION OF BLADDER TUMOR (TURBT) WITH CYSTOSCOPY (N/A )   Anesthesia type: General   Pre-op diagnosis: BLADDER TUMOR   Location: [place] OR ROOM 2 / [place]   Surgeon: Kathie Rhodes, MD      DISCUSSION:  Pt is a 68 year old male with hx CAD (s/p DES to diagonal 2009), HTN, DM  - Pt denies CV symptoms during call with RN.  - Reviewed case with Dr. Lissa Hoard.  Pt has known CAD with DES to D1 in 2009 and 99% blockage just prior to stent in D1 in 2012 (PCI was unsuccessful).  Pt has not seen cardiology in almost 2 years. Pt will need cardiology clearance prior to surgery. Coni in Dr. Simone Curia office was notified (by Jeral Fruit, RN)   PROVIDERS: - PCP is [name], MD  - Used to see cardiologist Lauree Chandler, MD. Last office visit 10/18/16 with Lyda Jester, PA.    LABS: Will be obtained day of surgery    EKG: Will be obtained day of surgery    CV:  Nuclear stress test 01/27/15:   Nuclear stress EF: 62%.  There was no ST segment deviation noted during stress.  The study is normal.  The left ventricular ejection fraction is normal (55-65%). 1. No ischemia or infarction by perfusion imaging.  2. Normal LV systolic function and wall motion.   Cardiac cath 08/26/10:  - RCA: small, nondominant vessel. - LM: short with no angiographic coronary artery disease. - LCx: dominant. Luminal irregularities.  Left-sided PDA as well as a large PLOM. - LAD itself had luminal irregularities. Moderate-sized first diagonal had a drug- eluting stent about midway down the vessel.  This was patent, however, there was a 99% stenosis just prior to the stent with TIMI 2 flow down the rest of the vessel.  The stenosis extended almost back, but not quite back to the ostium. - PCI attempt later 08/26/10 was unsuccessful.     Past Medical History:  Diagnosis Date  . Bladder  tumor   . Coronary artery disease cardiologist-- dr [name] (per pt last office visit 10/ 2018)   s/p  PCI w/ DES x1 to diagonal branch of LAD  . DDD (degenerative disc disease), lumbar   . Degenerative joint disease   . History of concussion    per pt age 68 w/ brief loc--- no residual  . Hyperlipidemia   . Hypertension   . OA (osteoarthritis)   . S/P drug eluting coronary stent placement 10/24/2007   diagonal branch of LAD  . Type 2 diabetes mellitus treated with insulin ([place])    followed by pcp  . Varicose vein of leg   . Wears glasses     Past Surgical History:  Procedure Laterality Date  . APPENDECTOMY  age 69  . COLONOSCOPY  2012  . CORONARY ANGIOPLASTY  08-26-2010   dr [name]   previous stent patent,  proximal prior to previous stent 99% stenosis;  Unsuccessful multiple attempts PCI, unability to cross with a ballon;  medical management  . CORONARY ANGIOPLASTY WITH STENT PLACEMENT  10-23-2017  dr Darnell Level brodie   positive ischemia on myoview;   40% pLAD and D1 80%,  diagonal branch treated w/ PCI and DES   . EXCISION MIDLINE SUPERIOR THYOID ISTHMUS MASS  11-27-2008   dr Ernesto Rutherford   bening lipoma  . KNEE ARTHROSCOPY Left 01-22-2004   dr Percell Miller  .  POSTERIOR LUMBAR FUSION  11-14-2012   dr Christella Noa @[facility]    L3 -- 5  . SHOULDER ARTHROSCOPY Right 2019  . SHOULDER ARTHROSCOPY WITH ROTATOR CUFF REPAIR AND SUBACROMIAL DECOMPRESSION Left 11-12-2009   dr Percell Miller  @[facility]    w/ Labral debridement and DCR  . TONSILLECTOMY  age 44  . TOTAL KNEE ARTHROPLASTY Left 02/17/2016   Procedure: TOTAL KNEE ARTHROPLASTY;  Surgeon: Ninetta Lights, MD;  Location: [place];  Service: Orthopedics;  Laterality: Left;  . TOTAL KNEE ARTHROPLASTY Right 02-25-2004   dr Percell Miller  @[facility]   . VARICOSE VEIN SURGERY  1995    MEDICATIONS: No current facility-administered medications for this encounter.    . Dulaglutide (TRULICITY) 1.5 YI/5.0YD SOPN  . empagliflozin (JARDIANCE) 25 MG TABS tablet  . insulin glargine  (LANTUS) 100 UNIT/ML injection  . metFORMIN (GLUCOPHAGE-XR) 500 MG 24 hr tablet  . olmesartan (BENICAR) 40 MG tablet  . Potassium Citrate 15 MEQ (1620 MG) TBCR   Willeen Cass, FNP-BC [facility]/Anesthesiology Phone: [phone] 08/03/2018 4:13 PM

## 2018-08-06 ENCOUNTER — Ambulatory Visit (HOSPITAL_BASED_OUTPATIENT_CLINIC_OR_DEPARTMENT_OTHER): Admission: RE | Admit: 2018-08-06 | Payer: Medicare Other | Source: Home / Self Care | Admitting: Urology

## 2018-08-06 HISTORY — DX: Unspecified osteoarthritis, unspecified site: M19.90

## 2018-08-06 HISTORY — DX: Asymptomatic varicose veins of unspecified lower extremity: I83.90

## 2018-08-06 HISTORY — DX: Other intervertebral disc degeneration, lumbar region without mention of lumbar back pain or lower extremity pain: M51.369

## 2018-08-06 HISTORY — DX: Personal history of traumatic brain injury: Z87.820

## 2018-08-06 HISTORY — DX: Other intervertebral disc degeneration, lumbar region: M51.36

## 2018-08-06 HISTORY — DX: Type 2 diabetes mellitus without complications: Z79.4

## 2018-08-06 HISTORY — DX: Presence of spectacles and contact lenses: Z97.3

## 2018-08-06 HISTORY — DX: Type 2 diabetes mellitus without complications: E11.9

## 2018-08-06 HISTORY — DX: Neoplasm of unspecified behavior of bladder: D49.4

## 2018-08-06 SURGERY — TURBT (TRANSURETHRAL RESECTION OF BLADDER TUMOR)
Anesthesia: General

## 2018-08-09 ENCOUNTER — Other Ambulatory Visit: Payer: Self-pay

## 2018-08-09 ENCOUNTER — Encounter: Payer: Self-pay | Admitting: Cardiology

## 2018-08-09 ENCOUNTER — Ambulatory Visit (INDEPENDENT_AMBULATORY_CARE_PROVIDER_SITE_OTHER): Payer: Medicare Other | Admitting: Cardiology

## 2018-08-09 VITALS — BP 122/64 | HR 75 | Ht 72.0 in | Wt 235.6 lb

## 2018-08-09 DIAGNOSIS — I2510 Atherosclerotic heart disease of native coronary artery without angina pectoris: Secondary | ICD-10-CM

## 2018-08-09 DIAGNOSIS — Z01810 Encounter for preprocedural cardiovascular examination: Secondary | ICD-10-CM

## 2018-08-09 NOTE — Progress Notes (Signed)
08/09/2018 Chad Yu   1950-04-15  [id number]  Primary Physician [name], MD Primary Cardiologist: Dr. [name] Electrophysiologist: None   Reason for Visit/CC: pre operative evaluation   HPI:  Chad Yu is a 68 year old male presenting to clinic today for preoperative cardiac evaluation prior to needed surgery, to resect a bladder tumor. This is scheduled to be a day surgery with urology.  He has h/o CAD, HTN, HLD and DM. He was followed by Dr. Verl Blalock in the past but is now followed by Dr. [name]. Last cath was in August 2012, performed by Dr. [name]. The previously stented diagonal branch had a patent Taxus stent but there was a sub-total occlusion prior to the stent in the diagonal branch that was unable to be opened. He was placed on medical therapy and did well. He is intolerant to statins, but has had success with PCSK9 (Praluent). This is followed by his PCP Dr. Nancy Fetter.  He also states that he is enrolled in a research study at [facility] for cholesterol management.  No BBs given borderline bradycardia. He denies tobacco use. I saw him in Oct 2018 for clearance for TKR. He was doing well and cleared for surgery. He had no cardiac complications. In Feb 2019, he needed clearance for rt shoulder scope. He was cleared via the preop clinic (did not require office visit due to time of last visit and lack of cardiac symptoms).   He is now needing to undergo a urologic procedure, transurethral resection of a bladder tumor.  He has done well from a cardiac standpoint.  He denies any anginal symptomatology.  No exertional chest pain or dyspnea.  He is able to ambulate a flight of stairs, 4 METS, without any anginal symptoms.  His EKG shows normal sinus rhythm without any ischemic abnormalities.  Physical exam is benign.  Vital signs stable.   No outpatient medications have been marked as taking for the 08/09/18 encounter (Office Visit) with [name], PA-C.   Allergies   Allergen Reactions  . Lisinopril Other (See Comments)    Severe cough Hair ball in throat  . Statins Hives and Other (See Comments)    Other reaction(s): Cough (ALLERGY/intolerance), Myalgias (intolerance) No cholesterol meds  . Atorvastatin Other (See Comments)    REACTION: myalgies  . Rosuvastatin Itching   Past Medical History:  Diagnosis Date  . Bladder tumor   . Coronary artery disease cardiologist-- dr [name] (per pt last office visit 10/ 2018)   s/p  PCI w/ DES x1 to diagonal branch of LAD  . DDD (degenerative disc disease), lumbar   . Degenerative joint disease   . History of concussion    per pt age 27 w/ brief loc--- no residual  . Hyperlipidemia   . Hypertension   . OA (osteoarthritis)   . S/P drug eluting coronary stent placement 10/24/2007   diagonal branch of LAD  . Type 2 diabetes mellitus treated with insulin ([place])    followed by pcp  . Varicose vein of leg   . Wears glasses    Family History  Problem Relation Age of Onset  . Heart attack Father   . Hypertension Father   . Heart attack Brother    Past Surgical History:  Procedure Laterality Date  . APPENDECTOMY  age 62  . COLONOSCOPY  2012  . CORONARY ANGIOPLASTY  08-26-2010   dr [name]   previous stent patent,  proximal prior to previous stent 99% stenosis;  Unsuccessful multiple attempts  PCI, unability to cross with a ballon;  medical management  . CORONARY ANGIOPLASTY WITH STENT PLACEMENT  10-23-2017  dr Darnell Level brodie   positive ischemia on myoview;   40% pLAD and D1 80%,  diagonal branch treated w/ PCI and DES   . EXCISION MIDLINE SUPERIOR THYOID ISTHMUS MASS  11-27-2008   dr Ernesto Rutherford   bening lipoma  . KNEE ARTHROSCOPY Left 01-22-2004   dr Percell Miller  . POSTERIOR LUMBAR FUSION  11-14-2012   dr Christella Noa @[facility]    L3 -- 5  . SHOULDER ARTHROSCOPY Right 2019  . SHOULDER ARTHROSCOPY WITH ROTATOR CUFF REPAIR AND SUBACROMIAL DECOMPRESSION Left 11-12-2009   dr Percell Miller  @[facility]    w/ Labral debridement and DCR  .  TONSILLECTOMY  age 46  . TOTAL KNEE ARTHROPLASTY Left 02/17/2016   Procedure: TOTAL KNEE ARTHROPLASTY;  Surgeon: Ninetta Lights, MD;  Location: [place];  Service: Orthopedics;  Laterality: Left;  . TOTAL KNEE ARTHROPLASTY Right 02-25-2004   dr Percell Miller  @[facility]   . [place]   Social History   Socioeconomic History  . Marital status: Married    Spouse name: Not on file  . Number of children: 4  . Years of education: Not on file  . Highest education level: Not on file  Occupational History  . 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 Financial resource strain: Not on file  . Food insecurity    Worry: Not on file    Inability: Not on file  . Transportation needs    Medical: Not on file    Non-medical: Not on file  Tobacco Use  . Smoking status: Never Smoker  . Smokeless tobacco: Never Used  Substance and Sexual Activity  . Alcohol use: No  . Drug use: No  . Sexual activity: Not on file  Lifestyle  . Physical activity    Days per week: Not on file    Minutes per session: Not on file  . Stress: Not on file  Relationships  . Social Herbalist on phone: Not on file    Gets together: Not on file    Attends religious service: Not on file    Active member of club or organization: Not on file    Attends meetings of clubs or organizations: Not on file    Relationship status: Not on file  . Intimate partner violence    Fear of current or ex partner: Not on file    Emotionally abused: Not on file    Physically abused: Not on file    Forced sexual activity: Not on file  Other Topics Concern  . Not on file  Social History Narrative   The patient lives in [place] with his wife. He works in Leisure centre manager. He denies tobacco or alcohol abuse . He denies regular exercise.     Lipid Panel     Component Value Date/Time   CHOL 193 12/16/2010 0540   TRIG 323 (H) 12/16/2010 0540   HDL 34 (L)  12/16/2010 0540   CHOLHDL 5.7 12/16/2010 0540   VLDL 65 (H) 12/16/2010 0540   LDLCALC 94 12/16/2010 0540    Review of Systems: General: negative for chills, fever, night sweats or weight changes.  Cardiovascular: negative for chest pain, dyspnea on exertion, edema, orthopnea, palpitations, paroxysmal nocturnal dyspnea or shortness of breath Dermatological: negative for rash Respiratory: negative for cough or wheezing Urologic: negative for hematuria Abdominal: negative for  nausea, vomiting, diarrhea, bright red blood per rectum, melena, or hematemesis Neurologic: negative for visual changes, syncope, or dizziness All other systems reviewed and are otherwise negative except as noted above.   Physical Exam:  Blood pressure 122/64, pulse 75, height 6' (1.829 m), weight 235 lb 9.6 oz (106.9 kg), SpO2 96 %.  General appearance: alert, cooperative and no distress Neck: no carotid bruit and no JVD Lungs: clear to auscultation bilaterally Heart: regular rate and rhythm, S1, S2 normal, no murmur, click, rub or gallop Extremities: extremities normal, atraumatic, no cyanosis or edema Pulses: 2+ and symmetric Skin: Skin color, texture, turgor normal. No rashes or lesions Neurologic: Grossly normal  EKG normal sinus rhythm, 75 bpm low voltage in aVF and V1and lead III flattened T waves in V1, AVL-- personally reviewed   ASSESSMENT AND PLAN:   1.  CAD: Per above.  He denies any anginal symptomatology.  Continue medical therapy.  2.  Hyperlipidemia: Followed by PCP and research/clinical trial team at [facility].  He is currently enrolled in a lipid study.  3.  Preoperative cardiovascular examination:  He denies any anginal symptomatology.  No exertional chest pain or dyspnea.  He is able to ambulate a flight of stairs, 4 METS, without any anginal symptoms.  His EKG shows normal sinus rhythm without any ischemic abnormalities.  Physical exam is benign.  Vital signs stable.  Patient can  be cleared to undergo urologic procedure without need for further cardiac testing.  He is of acceptable risk.   Follow-Up in 1 year with Dr. Mosetta Putt Ladoris Gene, [facility] 08/09/2018 12:26 PM

## 2018-08-09 NOTE — Patient Instructions (Signed)
Medication Instructions:  none If you need a refill on your cardiac medications before your next appointment, please call your pharmacy.   Lab work: none If you have labs (blood work) drawn today and your tests are completely normal, you will receive your results only by: Marland Kitchen MyChart Message (if you have MyChart) OR . A paper copy in the mail If you have any lab test that is abnormal or we need to change your treatment, we will call you to review the results.  Testing/Procedures: none  Follow-Up: At [facility], you and your health needs are our priority.  As part of our continuing mission to provide you with exceptional heart care, we have created designated Provider Care Teams.  These Care Teams include your primary Cardiologist (physician) and Advanced Practice Providers (APPs -  Physician Assistants and Nurse Practitioners) who all work together to provide you with the care you need, when you need it. You will need a follow up appointment in 1 years.  Please call our office 2 months in advance to schedule this appointment.  You may see Dr [name] or one of the following Advanced Practice Providers on your designated Care Team:   Lyda Jester, PA-C Melina Copa, PA-C . Ermalinda Barrios, PA-C  Any Other Special Instructions Will Be Listed Below (If Applicable).

## 2018-08-10 ENCOUNTER — Other Ambulatory Visit: Payer: Self-pay | Admitting: Urology

## 2018-08-22 NOTE — Patient Instructions (Addendum)
YOU NEED TO HAVE A COVID 19 TEST ON__Tues 8/11_____ @___10 :55____, THIS TEST MUST BE DONE BEFORE SURGERY, COME  Chad Yu , [id number]. ONCE YOUR COVID TEST IS COMPLETED, PLEASE BEGIN THE QUARANTINE INSTRUCTIONS AS OUTLINED IN YOUR HANDOUT.                Chad Yu   Your procedure is scheduled [facility] 08/31/18    Report to [facility]  Entrance Report to admitting at  9:00 AM   1 VISITOR IS ALLOWED TO WAIT IN WAITING ROOM  ONLY DAY OF YOUR SURGERY.  No family in short stay at this time   Call this number if you have problems the morning of surgery [phone]     Remember: Do not eat food or drink liquids :After Midnight . BRUSH YOUR TEETH MORNING OF SURGERY AND RINSE YOUR MOUTH OUT, NO CHEWING GUM CANDY OR MINTS.     Take these medicines the morning of surgery with A SIP OF WATER: None         How to Manage Your Diabetes Before and After Surgery  Why is it important to control my blood sugar before and after surgery? . Improving blood sugar levels before and after surgery helps healing and can limit problems. . A way of improving blood sugar control is eating a healthy diet by: o  Eating less sugar and carbohydrates o  Increasing activity/exercise o  Talking with your doctor about reaching your blood sugar goals . High blood sugars (greater than 180 mg/dL) can raise your risk of infections and slow your recovery, so you will need to focus on controlling your diabetes during the weeks before surgery. . Make sure that the doctor who takes care of your diabetes knows about your planned surgery including the date and location.  How do I manage my blood sugar before surgery? . Check your blood sugar at least 4 times a day, starting 2 days before surgery, to make sure that the level is not too high or low. o Check your blood sugar the morning of your surgery when you wake up and every 2 hours until you get to the Short Stay unit. . If your  blood sugar is less than 70 mg/dL, you will need to treat for low blood sugar: o Do not take insulin. o Treat a low blood sugar (less than 70 mg/dL) with  cup of clear juice (cranberry or apple), 4 glucose tablets, OR glucose gel. o Recheck blood sugar in 15 minutes after treatment (to make sure it is greater than 70 mg/dL). If your blood sugar is not greater than 70 mg/dL on recheck, call [phone] for further instructions. . Report your blood sugar to the short stay nurse when you get to Short Stay.  . If you are admitted to the hospital after surgery: o Your blood sugar will be checked by the staff and you will probably be given insulin after surgery (instead of oral diabetes medicines) to make sure you have good blood sugar levels. o The goal for blood sugar control after surgery is 80-180 mg/dL.   WHAT DO I DO ABOUT MY DIABETES MEDICATION?  Marland Kitchen Do not take oral diabetes medicines (pills) the morning of surgery.  . THE NIGHT BEFORE SURGERY, take  22  units of  glargine (Lantus)      insulin.       . The day of surgery, do not take other diabetes injectables, including Byetta (exenatide), Bydureon (  exenatide ER), .  Victoza (liraglutide), or Trulicity (dulaglutide).                   You may not have any metal on your body including piercings              Do not wear jewelry,  lotions, powders or  deodorant                         Men may shave face and neck.  [place] - Preparing for Surgery Before surgery, you can play an important role.   Because skin is not sterile, your skin needs to be as free of germs as possible .  You can reduce the number of germs on your skin by washing with CHG (chlorahexidine gluconate) soap before surgery .  CHG is an antiseptic cleaner which kills germs and bonds with the skin to continue killing germs even after washing. Please DO NOT use if you have an allergy to CHG or antibacterial soaps .  If your skin becomes reddened/irritated stop  using the CHG and inform your nurse when you arrive at Short Stay. .  You may shave your face/neck. Please follow these instructions carefully:  1.  Shower with CHG Soap the night before surgery and the  morning of Surgery.  2.  If you choose to wash your hair, wash your hair first as usual with your  normal  shampoo.  3.  After you shampoo, rinse your hair and body thoroughly to remove the  shampoo.                                        4.  Use CHG as you would any other liquid soap.  You can apply chg directly  to the skin and wash                       Gently with a scrungie or clean washcloth.  5.  Apply the CHG Soap to your body ONLY FROM THE NECK DOWN.   Do not use on face/ open                           Wound or open sores. Avoid contact with eyes, ears mouth and genitals (private parts).                       Wash face,  Genitals (private parts) with your normal soap.             6.  Wash thoroughly, paying special attention to the area where your surgery  will be performed.  7.  Thoroughly rinse your body with warm water from the neck down.  8.  DO NOT shower/wash with your normal soap after using and rinsing off  the CHG Soap.             9.  Pat yourself dry with a clean towel.            10.  Wear clean pajamas.            11.  Place clean sheets on your bed the night of your first shower and do not  sleep with pets. Day of Surgery : Do not apply any lotions/deodorants the morning of  surgery.  Please wear clean clothes to the hospital/surgery center.  FAILURE TO FOLLOW THESE INSTRUCTIONS MAY RESULT IN THE CANCELLATION OF YOUR SURGERY PATIENT SIGNATURE_________________________________  NURSE SIGNATURE__________________________________  ________________________________________________________________________   Do not bring valuables to the hospital. [facility] IS NOT             RESPONSIBLE   FOR VALUABLES.  Contacts, dentures or bridgework may not be worn into  surgery.       Patients discharged the day of surgery will not be allowed to drive home.  IF YOU ARE HAVING SURGERY AND GOING HOME THE SAME DAY, YOU MUST HAVE AN ADULT TO DRIVE YOU HOME AND BE WITH YOU FOR 24 HOURS.  YOU MAY GO HOME BY TAXI OR UBER OR ORTHERWISE, BUT AN ADULT MUST ACCOMPANY YOU HOME AND STAY WITH YOU FOR 24 HOURS.  Name and phone number of your driver:  Special Instructions: N/A

## 2018-08-23 ENCOUNTER — Encounter (HOSPITAL_COMMUNITY): Payer: Self-pay

## 2018-08-23 ENCOUNTER — Encounter (HOSPITAL_COMMUNITY)
Admission: RE | Admit: 2018-08-23 | Discharge: 2018-08-23 | Disposition: A | Discharge status: Home / Self Care | Payer: Medicare Other | Source: Ambulatory Visit | Attending: Urology | Admitting: Urology

## 2018-08-23 ENCOUNTER — Telehealth: Payer: Self-pay | Admitting: Cardiovascular Disease

## 2018-08-23 ENCOUNTER — Other Ambulatory Visit: Payer: Self-pay

## 2018-08-23 DIAGNOSIS — Z01812 Encounter for preprocedural laboratory examination: Secondary | ICD-10-CM | POA: Insufficient documentation

## 2018-08-23 DIAGNOSIS — E119 Type 2 diabetes mellitus without complications: Secondary | ICD-10-CM | POA: Diagnosis not present

## 2018-08-23 DIAGNOSIS — E785 Hyperlipidemia, unspecified: Secondary | ICD-10-CM | POA: Insufficient documentation

## 2018-08-23 DIAGNOSIS — D494 Neoplasm of unspecified behavior of bladder: Secondary | ICD-10-CM | POA: Diagnosis not present

## 2018-08-23 DIAGNOSIS — I2510 Atherosclerotic heart disease of native coronary artery without angina pectoris: Secondary | ICD-10-CM | POA: Insufficient documentation

## 2018-08-23 DIAGNOSIS — I10 Essential (primary) hypertension: Secondary | ICD-10-CM | POA: Diagnosis not present

## 2018-08-23 LAB — HEMOGLOBIN A1C
Hgb A1c MFr Bld: 7.6 % — ABNORMAL HIGH (ref 4.8–5.6)
Mean Plasma Glucose: 171.42 mg/dL

## 2018-08-23 LAB — CBC
HCT: 47.4 % (ref 39.0–52.0)
Hemoglobin: 15.6 g/dL (ref 13.0–17.0)
MCH: 31 pg (ref 26.0–34.0)
MCHC: 32.9 g/dL (ref 30.0–36.0)
MCV: 94.2 fL (ref 80.0–100.0)
Platelets: 193 10*3/uL (ref 150–400)
RBC: 5.03 MIL/uL (ref 4.22–5.81)
RDW: 12.9 % (ref 11.5–15.5)
WBC: 5.7 10*3/uL (ref 4.0–10.5)
nRBC: 0 % (ref 0.0–0.2)

## 2018-08-23 LAB — SURGICAL PCR SCREEN
MRSA, PCR: NEGATIVE
Staphylococcus aureus: NEGATIVE

## 2018-08-23 LAB — BASIC METABOLIC PANEL
Anion gap: 10 (ref 5–15)
BUN: 35 mg/dL — ABNORMAL HIGH (ref 8–23)
CO2: 24 mmol/L (ref 22–32)
Calcium: 9.3 mg/dL (ref 8.9–10.3)
Chloride: 103 mmol/L (ref 98–111)
Creatinine, Ser: 1.34 mg/dL — ABNORMAL HIGH (ref 0.61–1.24)
GFR calc Af Amer: >60 mL/min (ref >60)
GFR calc non Af Amer: 54 mL/min — ABNORMAL LOW (ref >60)
Glucose, Bld: 261 mg/dL — ABNORMAL HIGH (ref 70–99)
Potassium: 4.9 mmol/L (ref 3.5–5.1)
Sodium: 137 mmol/L (ref 135–145)

## 2018-08-23 LAB — GLUCOSE, CAPILLARY: Glucose-Capillary: 245 mg/dL — ABNORMAL HIGH (ref 70–99)

## 2018-08-23 NOTE — Telephone Encounter (Signed)
Pre-admission testing called stating they need a note stating patient is cleared to surgery that his is cleared for surgery.  Ned Card  can be reached at [phone] fax number [phone]

## 2018-08-23 NOTE — Telephone Encounter (Signed)
I printed OV and having Medical Records fax to [phone].

## 2018-08-27 NOTE — Progress Notes (Signed)
Anesthesia Chart Review   Case: [id number] Date/Time: 08/31/18 1045   Procedure: TRANSURETHRAL RESECTION OF BLADDER TUMOR (TURBT) WITH INSTILLATION OF POST OPERATIVE CHEMOTHERAPY (N/A )   Anesthesia type: General   Pre-op diagnosis: BLADDER TUMOR   Location: [place] / [facility]   Surgeon: Kathie Rhodes, MD      DISCUSSION:68 y.o. never smoker with h/o HLD, DM II, CAD (DES to LAD 2009), bladder tumor scheduled for above procedure 08/31/2018 with Dr. Kathie Rhodes.    Pt last seen by cardiology, 08/09/2018.  Seen by Lyda Jester, PA-C.  Per OV note, "He denies any anginal symptomatology.  No exertional chest pain or dyspnea.  He is able to ambulate a flight of stairs, 4 METS, without any anginal symptoms.  His EKG shows normal sinus rhythm without any ischemic abnormalities.  Physical exam is benign.  Vital signs stable.  Patient can be cleared to undergo urologic procedure without need for further cardiac testing.  He is of acceptable risk."  Anticipate pt can proceed with planned procedure barring acute status change.    VS: BP 130/69   Pulse 83   Temp 36.5 C (Oral)   Resp 20   Ht 6' (1.829 m)   Wt 105.2 kg   SpO2 98%   BMI 31.46 kg/m   PROVIDERS: [name], MD  Is PCP   Lauree Chandler, MD is Cardiologist  LABS: Labs reviewed: Acceptable for surgery. (all labs ordered are listed, but only abnormal results are displayed)  Labs Reviewed  HEMOGLOBIN A1C - Abnormal; Notable for the following components:      Result Value   Hgb A1c MFr Bld 7.6 (*)    All other components within normal limits  BASIC METABOLIC PANEL - Abnormal; Notable for the following components:   Glucose, Bld 261 (*)    BUN 35 (*)    Creatinine, Ser 1.34 (*)    GFR calc non Af Amer 54 (*)    All other components within normal limits  GLUCOSE, CAPILLARY - Abnormal; Notable for the following components:   Glucose-Capillary 245 (*)    All other components within normal limits  SURGICAL PCR  SCREEN  CBC     IMAGES:   EKG: 08/09/2018 Rate 75 bpm Normal sinus rhythm   CV: Stress Test 01/27/15  Nuclear stress EF: 62%.  There was no ST segment deviation noted during stress.  The study is normal.  The left ventricular ejection fraction is normal (55-65%).   1. No ischemia or infarction by perfusion imaging.  2. Normal LV systolic function and wall motion.   Past Medical History:  Diagnosis Date  . Bladder tumor   . Coronary artery disease cardiologist-- dr [name] (per pt last office visit 10/ 2018)   s/p  PCI w/ DES x1 to diagonal branch of LAD  . DDD (degenerative disc disease), lumbar   . Degenerative joint disease   . History of concussion    per pt age 68 w/ brief loc--- no residual  . Hyperlipidemia   . Hypertension   . OA (osteoarthritis)   . S/P drug eluting coronary stent placement 10/24/2007   diagonal branch of LAD  . Type 2 diabetes mellitus treated with insulin ([place])    followed by pcp  . Varicose vein of leg   . Wears glasses     Past Surgical History:  Procedure Laterality Date  . APPENDECTOMY  age 4  . COLONOSCOPY  2012  . CORONARY ANGIOPLASTY  08-26-2010  dr [name]   previous stent patent,  proximal prior to previous stent 99% stenosis;  Unsuccessful multiple attempts PCI, unability to cross with a ballon;  medical management  . CORONARY ANGIOPLASTY WITH STENT PLACEMENT  10-23-2017  dr Darnell Level brodie   positive ischemia on myoview;   40% pLAD and D1 80%,  diagonal branch treated w/ PCI and DES   . EXCISION MIDLINE SUPERIOR THYOID ISTHMUS MASS  11-27-2008   dr Ernesto Rutherford   bening lipoma  . KNEE ARTHROSCOPY Left 01-22-2004   dr Percell Miller  . POSTERIOR LUMBAR FUSION  11-14-2012   dr Christella Noa @[facility]    L3 -- 5  . SHOULDER ARTHROSCOPY Right 2019  . SHOULDER ARTHROSCOPY WITH ROTATOR CUFF REPAIR AND SUBACROMIAL DECOMPRESSION Left 11-12-2009   dr Percell Miller  @[facility]    w/ Labral debridement and DCR  . TONSILLECTOMY  age 54  . TOTAL KNEE ARTHROPLASTY Left  02/17/2016   Procedure: TOTAL KNEE ARTHROPLASTY;  Surgeon: Ninetta Lights, MD;  Location: [facility];  Service: Orthopedics;  Laterality: Left;  . TOTAL KNEE ARTHROPLASTY Right 02-25-2004   dr Percell Miller  @[facility]   . VARICOSE VEIN SURGERY  1995    MEDICATIONS: . Aspirin-Salicylamide-Caffeine (BC FAST PAIN RELIEF) 650-195-33.3 MG PACK  . Cholecalciferol (VITAMIN D3 PO)  . diclofenac sodium (VOLTAREN) 1 % GEL  . Dulaglutide (TRULICITY) 1.5 GY/6.5LD SOPN  . empagliflozin (JARDIANCE) 25 MG TABS tablet  . GARLIC PO  . Homeopathic Products ([place])  . insulin glargine (LANTUS) 100 UNIT/ML injection  . metFORMIN (GLUCOPHAGE-XR) 500 MG 24 hr tablet  . Multiple Vitamin (MULTIVITAMIN WITH MINERALS) TABS tablet  . olmesartan (BENICAR) 40 MG tablet  . Potassium Citrate 15 MEQ (1620 MG) TBCR  . POTASSIUM PO  . tetrahydrozoline 0.05 % ophthalmic solution   No current facility-administered medications for this encounter.      Maia Plan [facility] Pre-Surgical Testing [phone] 08/30/18  2:52 PM

## 2018-08-28 ENCOUNTER — Other Ambulatory Visit (HOSPITAL_COMMUNITY)
Admission: RE | Admit: 2018-08-28 | Discharge: 2018-08-28 | Disposition: A | Discharge status: Home / Self Care | Payer: Medicare Other | Source: Ambulatory Visit | Attending: Urology | Admitting: Urology

## 2018-08-28 DIAGNOSIS — Z01812 Encounter for preprocedural laboratory examination: Secondary | ICD-10-CM | POA: Insufficient documentation

## 2018-08-28 DIAGNOSIS — Z20828 Contact with and (suspected) exposure to other viral communicable diseases: Secondary | ICD-10-CM | POA: Diagnosis not present

## 2018-08-28 LAB — SARS CORONAVIRUS 2 (TAT 6-24 HRS): SARS Coronavirus 2: NEGATIVE

## 2018-08-30 NOTE — Discharge Instructions (Signed)

## 2018-08-30 NOTE — H&P (Signed)
HPI: Chad Yu is a 68 year-old male with a blabber tumor.     CC/HPI: 06/12/18: A recent renal ultrasound was performed that revealed what appeared to be a bladder mass. He reports that he has seen hematuria and passed what he said looked like some fleshy material about a month ago. He has been intermittently experiencing some gross hematuria.  ALLERGIES: Statins    MEDICATIONS: Urocit-K 15 meq (1,620 mg) tablet, extended release 1 tablet PO BID  Farxiga 10 mg tablet  Lantas  Metformin Hcl Er 500 mg tablet, extended release 24 hr  Olmesartan Medoxomil 20 mg tablet  Trulicity     GU PSH: None     PSH Notes: Bilateral knees, right 20 years ago left knee 2018, Back Surgery 2015, Bilateral Shoulders left 2014, right 2017   NON-GU PSH: None   GU PMH: Renal calculus (Stable), Bilateral, He will begin potassium citrate and follow-up in 6 months for a renal ultrasound. - 03/05/2018, Bilateral, I have given him information on stone prevention. He will then return in 6 months for a KUB. In the meantime I have sent his stone off for compositional analysis., - 02/15/2018      PMH Notes: Calculus disease: He reports that he has passed 3 stones. His 1st was in 11/19 and then he just recently passed his 43rd. He really did not have any pain with the 3rd stone. With the 1st 1 he had some hesitancy with urination and then passed the stone.  Stone analysis: Uric acid 80% and calcium oxalate 20%     NON-GU PMH: Arthritis Diabetes Type 2 GERD Hypercholesterolemia Hypertension Sleep Apnea    FAMILY HISTORY: 1 Daughter - Daughter 3 Son's - Son Diabetes - Mother, Brother Heart Disease - Father stroke - Father   SOCIAL HISTORY: Marital Status: Married Preferred Language: English; Race: White Current Smoking Status: Patient has never smoked.   Tobacco Use Assessment Completed: Used Tobacco in last 30 days? Has never drank.  Drinks 1 caffeinated drink per day.    REVIEW OF SYSTEMS:     GU Review Male:   Patient denies frequent urination, hard to postpone urination, burning/ pain with urination, get up at night to urinate, leakage of urine, stream starts and stops, trouble starting your stream, have to strain to urinate , erection problems, and penile pain.  Gastrointestinal (Upper):   Patient denies nausea, vomiting, and indigestion/ heartburn.  Gastrointestinal (Lower):   Patient denies diarrhea and constipation.  Constitutional:   Patient denies fever, night sweats, weight loss, and fatigue.  Skin:   Patient denies skin rash/ lesion and itching.  Eyes:   Patient denies blurred vision and double vision.  Ears/ Nose/ Throat:   Patient denies sore throat and sinus problems.  Hematologic/Lymphatic:   Patient denies swollen glands and easy bruising.  Cardiovascular:   Patient denies leg swelling and chest pains.  Respiratory:   Patient denies cough and shortness of breath.  Endocrine:   Patient denies excessive thirst.  Musculoskeletal:   Patient denies back pain and joint pain.  Neurological:   Patient denies headaches and dizziness.  Psychologic:   Patient denies depression and anxiety.   VITAL SIGNS:    Weight 230 lb / 104.33 kg  Height 71 in / 180.34 cm  BP 158/93 mmHg  Pulse 65 /min  BMI 32.1 kg/m   GU PHYSICAL EXAMINATION:    Urethral Meatus: Normal size. No lesion, no wart, no discharge, no polyp. Normal location.  Penis: Circumcised, no warts,  no cracks. No dorsal Peyronie's plaques, no left corporal Peyronie's plaques, no right corporal Peyronie's plaques, no scarring, no warts. No balanitis, no meatal stenosis.   MULTI-SYSTEM PHYSICAL EXAMINATION:    Constitutional: Well-nourished. No physical deformities. Normally developed. Good grooming.  Neck: Neck symmetrical, not swollen. Normal tracheal position.  Respiratory: No labored breathing, no use of accessory muscles.   Cardiovascular: Normal temperature, normal extremity pulses, no swelling, no varicosities.   Lymphatic: No enlargement of neck, axillae, groin.  Skin: No paleness, no jaundice, no cyanosis. No lesion, no ulcer, no rash.  Neurologic / Psychiatric: Oriented to time, oriented to place, oriented to person. No depression, no anxiety, no agitation.  Gastrointestinal: No mass, no tenderness, no rigidity, non obese abdomen.  Eyes: Normal conjunctivae. Normal eyelids.  Ears, Nose, Mouth, and Throat: Left ear no scars, no lesions, no masses. Right ear no scars, no lesions, no masses. Nose no scars, no lesions, no masses. Normal hearing. Normal lips.  Musculoskeletal: Normal gait and station of head and neck.      PAST DATA REVIEWED:  Source Of History:  Patient  Lab Test Review:   BUN/Creatinine, Calcium, Uric Acid  Records Review:   Previous Doctor Records, Previous Patient Records, POC Tool  X-Ray Review: Renal Ultrasound: Reviewed Report. Renal ultrasound on 05/24/18 revealed bilateral, nonobstructing renal calculi with no hydronephrosis. Study was performed at an outside facility and therefore images were unavailable for independent review and not supplied.    Notes:                     His creatinine in 2/20 was 0.8 with a uric acid level of 6.3 and a calcium of 9.5.   PROCEDURES:         Flexible Cystoscopy - 06/12/18  Meatus:  Normal size. Normal location. Normal condition.  Urethra:  No strictures.  External Sphincter:  Normal.  Verumontanum:  Normal.  Prostate:  Borderline obstructing. Mild hyperplasia.  Bladder Neck:  Non-obstructing.  Ureteral Orifices:  Normal location. Normal size. Normal shape. Effluxed clear urine.  Bladder:  Mild trabeculation. 2 cm tumor. Solitary tumor. It was located on the floor of the bladder just posterior to the left ureteral orifice but did not involve the orifice. Normal mucosa. No stones.               Urinalysis w/Scope Dipstick Dipstick Cont'd Micro  Color: Yellow Bilirubin: Neg mg/dL WBC/hpf: NS (Not Seen)  Appearance: Clear Ketones:  Neg mg/dL RBC/hpf: 3 - 10/hpf  Specific Gravity: 1.020 Blood: 1+ ery/uL Bacteria: NS (Not Seen)  pH: <=5.0 Protein: Neg mg/dL Cystals: NS (Not Seen)  Glucose: 3+ mg/dL Urobilinogen: 0.2 mg/dL Casts: NS (Not Seen)    Nitrites: Neg Trichomonas: Not Present    Leukocyte Esterase: Neg leu/uL Mucous: Not Present      Epithelial Cells: NS (Not Seen)      Yeast: NS (Not Seen)      Sperm: Not Present    ASSESSMENT/PLAN:     ICD-10 Details  1 GU:   Bladder tumor/neoplasm - D41.4 He will be scheduled for TURBT as an outpatient with a postoperative gemcitabine. He will then likely need CT scan once pathology results return.  2   Renal calculus - N20.0 Stable - He has bilateral renal calculi. He has not been taking potassium citrate and will need to be restarted on this medication          Notes:   I went overmy cystoscopic findings today  which have revealed a bladder mass consistent with transitional cell carcinoma. Further characterization of the lesion is required for grading and staging purposes. We discussed proceeding with evaluation using transurethral resection of the lesion. I have discussed the procedure in detail as well as the potential risks and complications associated with this form of surgery. We also discussed the probability of successful resection of the intravesical portion of this lesion. I have recommended, as long as there is no contraindication at the time of surgery, the placement of intravesical Gemcitabine in order to reduce the risk of recurrence. We did discuss the potential side effects of this form of intravesical chemotherapy. The procedure will be performed under anesthesia as an outpatient.

## 2018-08-30 NOTE — Anesthesia Preprocedure Evaluation (Addendum)
Anesthesia Evaluation  Patient identified by MRN, date of birth, ID band Patient awake    Reviewed: Allergy & Precautions, NPO status , Patient's Chart, lab work & pertinent test results  Airway Mallampati: III  TM Distance: >3 FB     Dental  (+) Dental Advisory Given   Pulmonary neg pulmonary ROS,    breath sounds clear to auscultation       Cardiovascular hypertension, Pt. on medications + CAD and + Cardiac Stents   Rhythm:Regular Rate:Normal     Neuro/Psych negative neurological ROS     GI/Hepatic Neg liver ROS, GERD  ,  Endo/Other  diabetes, Type 2, Insulin Dependent  Renal/GU negative Renal ROS     Musculoskeletal  (+) Arthritis ,   Abdominal   Peds  Hematology negative hematology ROS (+)   Anesthesia Other Findings   Reproductive/Obstetrics                            Lab Results  Component Value Date   WBC 5.7 08/23/2018   HGB 15.6 08/23/2018   HCT 47.4 08/23/2018   MCV 94.2 08/23/2018   PLT 193 08/23/2018   Lab Results  Component Value Date   CREATININE 1.34 (H) 08/23/2018   BUN 35 (H) 08/23/2018   NA 137 08/23/2018   K 4.9 08/23/2018   CL 103 08/23/2018   CO2 24 08/23/2018    Anesthesia Physical Anesthesia Plan  ASA: III  Anesthesia Plan: General   Post-op Pain Management:    Induction: Intravenous  PONV Risk Score and Plan: 2 and Dexamethasone, Ondansetron and Treatment may vary due to age or medical condition  Airway Management Planned: LMA  Additional Equipment:   Intra-op Plan:   Post-operative Plan: Extubation in OR  Informed Consent: I have reviewed the patients History and Physical, chart, labs and discussed the procedure including the risks, benefits and alternatives for the proposed anesthesia with the patient or authorized representative who has indicated his/her understanding and acceptance.     Dental advisory given  Plan Discussed  with: CRNA  Anesthesia Plan Comments: (See PAT note 08/23/2018, Konrad Felix, PA-C)       Anesthesia Quick Evaluation

## 2018-08-31 ENCOUNTER — Encounter (HOSPITAL_COMMUNITY)
Admission: RE | Disposition: A | Discharge status: Home / Self Care | Payer: Self-pay | Source: Home / Self Care | Attending: Urology

## 2018-08-31 ENCOUNTER — Ambulatory Visit (HOSPITAL_COMMUNITY)
Admission: RE | Admit: 2018-08-31 | Discharge: 2018-08-31 | Disposition: A | Discharge status: Home / Self Care | Payer: Medicare Other | Attending: Urology | Admitting: Urology

## 2018-08-31 ENCOUNTER — Other Ambulatory Visit: Payer: Self-pay

## 2018-08-31 ENCOUNTER — Ambulatory Visit (HOSPITAL_COMMUNITY): Payer: Medicare Other | Admitting: Anesthesiology

## 2018-08-31 ENCOUNTER — Ambulatory Visit (HOSPITAL_COMMUNITY): Payer: Medicare Other | Admitting: Physician Assistant

## 2018-08-31 ENCOUNTER — Encounter (HOSPITAL_COMMUNITY): Payer: Self-pay | Admitting: *Deleted

## 2018-08-31 DIAGNOSIS — C679 Malignant neoplasm of bladder, unspecified: Secondary | ICD-10-CM | POA: Diagnosis not present

## 2018-08-31 DIAGNOSIS — E7800 Pure hypercholesterolemia, unspecified: Secondary | ICD-10-CM | POA: Insufficient documentation

## 2018-08-31 DIAGNOSIS — N200 Calculus of kidney: Secondary | ICD-10-CM | POA: Diagnosis not present

## 2018-08-31 DIAGNOSIS — M1990 Unspecified osteoarthritis, unspecified site: Secondary | ICD-10-CM | POA: Insufficient documentation

## 2018-08-31 DIAGNOSIS — E119 Type 2 diabetes mellitus without complications: Secondary | ICD-10-CM | POA: Diagnosis not present

## 2018-08-31 DIAGNOSIS — K219 Gastro-esophageal reflux disease without esophagitis: Secondary | ICD-10-CM | POA: Diagnosis not present

## 2018-08-31 DIAGNOSIS — Z955 Presence of coronary angioplasty implant and graft: Secondary | ICD-10-CM | POA: Insufficient documentation

## 2018-08-31 DIAGNOSIS — Z7984 Long term (current) use of oral hypoglycemic drugs: Secondary | ICD-10-CM | POA: Diagnosis not present

## 2018-08-31 DIAGNOSIS — I10 Essential (primary) hypertension: Secondary | ICD-10-CM | POA: Diagnosis not present

## 2018-08-31 DIAGNOSIS — I2510 Atherosclerotic heart disease of native coronary artery without angina pectoris: Secondary | ICD-10-CM | POA: Diagnosis not present

## 2018-08-31 DIAGNOSIS — G4730 Sleep apnea, unspecified: Secondary | ICD-10-CM | POA: Insufficient documentation

## 2018-08-31 DIAGNOSIS — D494 Neoplasm of unspecified behavior of bladder: Secondary | ICD-10-CM

## 2018-08-31 HISTORY — PX: TRANSURETHRAL RESECTION OF BLADDER TUMOR: SHX2575

## 2018-08-31 LAB — GLUCOSE, CAPILLARY
Glucose-Capillary: 154 mg/dL — ABNORMAL HIGH (ref 70–99)
Glucose-Capillary: 135 mg/dL — ABNORMAL HIGH (ref 70–99)

## 2018-08-31 SURGERY — TURBT (TRANSURETHRAL RESECTION OF BLADDER TUMOR)
Anesthesia: General | Site: Urethra

## 2018-08-31 MED ORDER — LACTATED RINGERS IV SOLN
INTRAVENOUS | Status: DC
  Administered 2018-08-31 (×2): via INTRAVENOUS

## 2018-08-31 MED ORDER — PROPOFOL 10 MG/ML IV BOLUS
INTRAVENOUS | Status: DC | PRN
  Administered 2018-08-31: 40 mg via INTRAVENOUS
  Administered 2018-08-31: 180 mg via INTRAVENOUS

## 2018-08-31 MED ORDER — PHENAZOPYRIDINE HCL 200 MG PO TABS: 200.0000 mg | ORAL_TABLET | Freq: Three times a day (TID) | ORAL | 0 refills | Status: DC | PRN

## 2018-08-31 MED ORDER — FENTANYL CITRATE (PF) 100 MCG/2ML IJ SOLN
INTRAMUSCULAR | Status: AC
  Filled 2018-08-31: qty 2

## 2018-08-31 MED ORDER — LIDOCAINE 2% (20 MG/ML) 5 ML SYRINGE
INTRAMUSCULAR | Status: AC
  Filled 2018-08-31: qty 5

## 2018-08-31 MED ORDER — PHENYLEPHRINE 40 MCG/ML (10ML) SYRINGE FOR IV PUSH (FOR BLOOD PRESSURE SUPPORT)
PREFILLED_SYRINGE | INTRAVENOUS | Status: DC | PRN
  Administered 2018-08-31: 100 ug via INTRAVENOUS

## 2018-08-31 MED ORDER — HYDROCODONE-ACETAMINOPHEN 10-325 MG PO TABS: 1.0000 | ORAL_TABLET | ORAL | 0 refills | Status: DC | PRN

## 2018-08-31 MED ORDER — EPHEDRINE SULFATE-NACL 50-0.9 MG/10ML-% IV SOSY
PREFILLED_SYRINGE | INTRAVENOUS | Status: DC | PRN
  Administered 2018-08-31: 10 mg via INTRAVENOUS
  Administered 2018-08-31: 5 mg via INTRAVENOUS
  Administered 2018-08-31 (×2): 10 mg via INTRAVENOUS

## 2018-08-31 MED ORDER — PROMETHAZINE HCL 25 MG/ML IJ SOLN: 6.2500 mg | INTRAMUSCULAR | Status: DC | PRN

## 2018-08-31 MED ORDER — FENTANYL CITRATE (PF) 100 MCG/2ML IJ SOLN: 25.0000 ug | INTRAMUSCULAR | Status: DC | PRN

## 2018-08-31 MED ORDER — ONDANSETRON HCL 4 MG/2ML IJ SOLN
INTRAMUSCULAR | Status: AC
  Filled 2018-08-31: qty 2

## 2018-08-31 MED ORDER — DEXAMETHASONE SODIUM PHOSPHATE 10 MG/ML IJ SOLN
INTRAMUSCULAR | Status: DC | PRN
  Administered 2018-08-31: 5 mg via INTRAVENOUS

## 2018-08-31 MED ORDER — PROPOFOL 10 MG/ML IV BOLUS
INTRAVENOUS | Status: AC
  Filled 2018-08-31: qty 20

## 2018-08-31 MED ORDER — SODIUM CHLORIDE 0.9 % IR SOLN
Status: DC | PRN
  Administered 2018-08-31 (×4): 6000 mL via INTRAVESICAL

## 2018-08-31 MED ORDER — DEXAMETHASONE SODIUM PHOSPHATE 10 MG/ML IJ SOLN
INTRAMUSCULAR | Status: AC
  Filled 2018-08-31: qty 1

## 2018-08-31 MED ORDER — LIDOCAINE HCL (CARDIAC) PF 100 MG/5ML IV SOSY
PREFILLED_SYRINGE | INTRAVENOUS | Status: DC | PRN
  Administered 2018-08-31: 60 mg via INTRAVENOUS

## 2018-08-31 MED ORDER — FENTANYL CITRATE (PF) 100 MCG/2ML IJ SOLN
INTRAMUSCULAR | Status: DC | PRN
  Administered 2018-08-31: 25 ug via INTRAVENOUS
  Administered 2018-08-31: 50 ug via INTRAVENOUS
  Administered 2018-08-31: 25 ug via INTRAVENOUS
  Administered 2018-08-31: 50 ug via INTRAVENOUS
  Administered 2018-08-31 (×2): 25 ug via INTRAVENOUS

## 2018-08-31 MED ORDER — ONDANSETRON HCL 4 MG/2ML IJ SOLN
INTRAMUSCULAR | Status: DC | PRN
  Administered 2018-08-31: 4 mg via INTRAVENOUS

## 2018-08-31 MED ORDER — CIPROFLOXACIN IN D5W 400 MG/200ML IV SOLN
400.0000 mg | Freq: Once | INTRAVENOUS | Status: AC
  Administered 2018-08-31: 400 mg via INTRAVENOUS
  Filled 2018-08-31: qty 200

## 2018-08-31 MED ORDER — GEMCITABINE CHEMO FOR BLADDER INSTILLATION 2000 MG
2000.0000 mg | Freq: Once | INTRAVENOUS | Status: DC
  Filled 2018-08-31: qty 52.6

## 2018-08-31 SURGICAL SUPPLY — 19 items
BAG URINE DRAINAGE (UROLOGICAL SUPPLIES) ×1 IMPLANT
BAG URO CATCHER STRL LF (MISCELLANEOUS) ×2 IMPLANT
CATH FOLEY 2WAY SLVR  5CC 22FR (CATHETERS) ×1
CATH FOLEY 2WAY SLVR 5CC 22FR (CATHETERS) IMPLANT
COVER WAND RF STERILE (DRAPES) IMPLANT
ELECT REM PT RETURN 15FT ADLT (MISCELLANEOUS) ×2 IMPLANT
EVACUATOR MICROVAS BLADDER (UROLOGICAL SUPPLIES) ×1 IMPLANT
GLOVE BIOGEL M 8.0 STRL (GLOVE) ×2 IMPLANT
GOWN STRL REUS W/TWL XL LVL3 (GOWN DISPOSABLE) ×2 IMPLANT
HOLDER FOLEY CATH W/STRAP (MISCELLANEOUS) ×1 IMPLANT
KIT TURNOVER KIT A (KITS) ×1 IMPLANT
LOOP CUT BIPOLAR 24F LRG (ELECTROSURGICAL) ×1 IMPLANT
MANIFOLD NEPTUNE II (INSTRUMENTS) ×2 IMPLANT
NS IRRIG 1000ML POUR BTL (IV SOLUTION) ×2 IMPLANT
PACK CYSTO (CUSTOM PROCEDURE TRAY) ×2 IMPLANT
SET ASPIRATION TUBING (TUBING) ×2 IMPLANT
SYRINGE IRR TOOMEY STRL 70CC (SYRINGE) ×1 IMPLANT
TUBING CONNECTING 10 (TUBING) ×2 IMPLANT
TUBING UROLOGY SET (TUBING) ×2 IMPLANT

## 2018-08-31 NOTE — Transfer of Care (Signed)
Immediate Anesthesia Transfer of Care Note  Patient: Chad Yu  Procedure(s) Performed: TRANSURETHRAL RESECTION OF BLADDER TUMOR (TURBT) WITH INSTILLATION OF POST OPERATIVE CHEMOTHERAPY (N/A Urethra)  Patient Location: PACU  Anesthesia Type:General  Level of Consciousness: awake, alert , oriented and patient cooperative  Airway & Oxygen Therapy: Patient Spontanous Breathing and Patient connected to face mask oxygen  Post-op Assessment: Report given to RN and Post -op Vital signs reviewed and stable  Post vital signs: Reviewed and stable  Last Vitals:  Vitals Value Taken Time  BP 143/83 08/31/18 1239  Temp    Pulse 88 08/31/18 1241  Resp 19 08/31/18 1241  SpO2 97 % 08/31/18 1241  Vitals shown include unvalidated device data.  Last Pain:  Vitals:   08/31/18 0934  TempSrc:   PainSc: 0-No pain         Complications: No apparent anesthesia complications

## 2018-08-31 NOTE — Anesthesia Postprocedure Evaluation (Signed)
Anesthesia Post Note  Patient: Chad Yu  Procedure(s) Performed: TRANSURETHRAL RESECTION OF BLADDER TUMOR (TURBT) WITH INSTILLATION OF POST OPERATIVE CHEMOTHERAPY (N/A Urethra)     Patient location during evaluation: PACU Anesthesia Type: General Level of consciousness: awake and alert Pain management: pain level controlled Vital Signs Assessment: post-procedure vital signs reviewed and stable Respiratory status: spontaneous breathing, nonlabored ventilation, respiratory function stable and patient connected to nasal cannula oxygen Cardiovascular status: blood pressure returned to baseline and stable Postop Assessment: no apparent nausea or vomiting Anesthetic complications: no    Last Vitals:  Vitals:   08/31/18 1300 08/31/18 1314  BP: 121/80 (!) 151/82  Pulse: 72 69  Resp: 16 16  Temp: 36.7 C (!) 36.4 C  SpO2: 98% 99%    Last Pain:  Vitals:   08/31/18 1314  TempSrc:   PainSc: 0-No pain                 Tiajuana Amass

## 2018-08-31 NOTE — Op Note (Signed)
PATIENT:  Chad Yu  PRE-OPERATIVE DIAGNOSIS: Bladder tumor  POST-OPERATIVE DIAGNOSIS: Same  PROCEDURE:  Procedure(s): 1. TRANSURETHRAL RESECTION OF BLADDER TUMOR (TURBT) (6 cm.)  SURGEON:  Surgeon(s): Claybon Jabs  ANESTHESIA:   General  EBL:  less than 50 mL  DRAINS: Urethral catheter (22 Fr. Foley)   SPECIMEN:  Bladder tumor  DISPOSITION OF SPECIMEN:  PATHOLOGY  Indication: [name] is a 68 year old male who was found by renal ultrasound to have what appeared to be a bladder mass and reported that he had been experiencing gross hematuria as well as what he described as some "fleshy material".  He has a history of calculus disease.  Cystoscopy in the office revealed what I thought was about a 2 cm bladder tumor but it was located on the left wall of the bladder and did not appear to involve the left ureteral orifice.  He is brought to the operating room today for resection of his tumor and consideration of postoperative intravesical chemotherapy.  Description of operation: The patient was taken to the operating room and administered general anesthesia. They were then placed on the table and moved to the dorsal lithotomy position after which the genitalia was sterilely prepped and draped. An official timeout was then performed.  The 69 French resectoscope with the 30 lens and visual obturator were then passed into the bladder under direct visualization. Urethra appeared normal. The visual obturator was then removed and the Gyrus resectoscope element with 30  lens was then inserted and the bladder was fully and systematically inspected. Ureteral orifices were noted to be in the normal anatomic positions.  The tumor was papillary in configuration but appeared to involve much greater portion of the bladder that I had appreciated initially in my office.  It involves the floor of the bladder extending from just medial to the left ureteral orifice on to the wall of the bladder, out  toward but not involving the bladder neck and then on up the wall of the bladder on the left-hand side to about the 2 o'clock position.  It extended back toward the posterior wall as well.  No other lesions were found within the bladder.  I first began by resecting the papillary portion of the tumor and continued to encounter papillary fronds and kept resecting these until they had been completely removed and in doing so noted that I had resected to well beyond what appeared to be the level of the bladder wall and possibly outside the bladder however I still never encountered any fat suggesting to me that the bladder tumor had involved the wall of his bladder and his extended locally into the perivesical tissue.  Bleeding points were cauterized.  I fulgurated the mucosa circumferentially and then reinspected the bladder and noted, with the water turned off, there was no bleeding.  The left ureteral orifice remained intact and was photographed.  Reinspection of the bladder revealed all obvious tumor had been fully resected and there was no evidence of perforation. The Microvasive evacuator was then used to irrigate the bladder and remove all of the portions of bladder tumor which were sent to pathology.  Reinspection of the bladder revealed no bleeding.  I then removed the resectoscope.  A 22 French Foley catheter was then inserted in the bladder and irrigated. The irrigant returned clear. The patient was awakened and taken to the recovery room.  Based on the fact that clinically this appears to be almost certainly muscle invasive bladder cancer I elected not  to instill intravesical gemcitabine.  PLAN OF CARE: Discharge to home after PACU  PATIENT DISPOSITION:  PACU - hemodynamically stable.

## 2018-08-31 NOTE — Anesthesia Procedure Notes (Signed)
Procedure Name: LMA Insertion Date/Time: 08/31/2018 11:37 AM Performed by: Raenette Rover, CRNA Pre-anesthesia Checklist: Emergency Drugs available, Patient identified, Suction available and Patient being monitored Patient Re-evaluated:Patient Re-evaluated prior to induction Oxygen Delivery Method: Circle system utilized Preoxygenation: Pre-oxygenation with 100% oxygen Induction Type: IV induction LMA: LMA inserted LMA Size: 4.0 Number of attempts: 1 Placement Confirmation: positive ETCO2,  CO2 detector and breath sounds checked- equal and bilateral Tube secured with: Tape Dental Injury: Teeth and Oropharynx as per pre-operative assessment

## 2018-09-01 ENCOUNTER — Encounter (HOSPITAL_COMMUNITY): Payer: Self-pay | Admitting: Urology

## 2018-09-11 ENCOUNTER — Other Ambulatory Visit: Payer: Self-pay | Admitting: Urology

## 2018-09-11 DIAGNOSIS — D376 Neoplasm of uncertain behavior of liver, gallbladder and bile ducts: Secondary | ICD-10-CM

## 2018-09-14 ENCOUNTER — Telehealth: Payer: Self-pay | Admitting: Oncology

## 2018-09-14 NOTE — Telephone Encounter (Signed)
Received a ne patient referral from Dr. Karsten Ro for bladder cancer. Mr Kaczynski has been cld and scheduled to see Dr. Alen Blew on 9/2 at [facility] to arrive 20 minutes early.

## 2018-09-19 ENCOUNTER — Inpatient Hospital Stay: Payer: Medicare Other | Attending: Oncology | Admitting: Oncology

## 2018-09-19 ENCOUNTER — Other Ambulatory Visit: Payer: Self-pay

## 2018-09-19 VITALS — BP 139/85 | HR 80 | Temp 97.8°F | Resp 18 | Ht 72.0 in | Wt 241.6 lb

## 2018-09-19 DIAGNOSIS — E119 Type 2 diabetes mellitus without complications: Secondary | ICD-10-CM | POA: Insufficient documentation

## 2018-09-19 DIAGNOSIS — I10 Essential (primary) hypertension: Secondary | ICD-10-CM | POA: Insufficient documentation

## 2018-09-19 DIAGNOSIS — Z5111 Encounter for antineoplastic chemotherapy: Secondary | ICD-10-CM | POA: Insufficient documentation

## 2018-09-19 DIAGNOSIS — C678 Malignant neoplasm of overlapping sites of bladder: Secondary | ICD-10-CM | POA: Diagnosis not present

## 2018-09-19 DIAGNOSIS — Z7189 Other specified counseling: Secondary | ICD-10-CM | POA: Diagnosis not present

## 2018-09-19 DIAGNOSIS — C679 Malignant neoplasm of bladder, unspecified: Secondary | ICD-10-CM | POA: Insufficient documentation

## 2018-09-19 MED ORDER — PROCHLORPERAZINE MALEATE 10 MG PO TABS: 10.0000 mg | ORAL_TABLET | Freq: Four times a day (QID) | ORAL | 0 refills | Status: DC | PRN

## 2018-09-19 NOTE — Progress Notes (Signed)
START ON PATHWAY REGIMEN - Bladder     A cycle is every 21 days:     Gemcitabine      Cisplatin   **Always confirm dose/schedule in your pharmacy ordering system**  Patient Characteristics: Pre-Cystectomy or Nonsurgical Candidate (Clinical Staging), cT2-4a, cN0-1, M0, Cystectomy Eligible, Cisplatin-Based Chemotherapy Indicated (CrCl ? 50 mL/min and Minimal or No Symptoms) Therapeutic Status: Pre-Cystectomy or Nonsurgical Candidate (Clinical Staging) AJCC M Category: cM0 AJCC 8 Stage Grouping: IIIA AJCC T Category: cT3 AJCC N Category: cN0 Intent of Therapy: Curative Intent, Discussed with Patient 

## 2018-09-19 NOTE — Progress Notes (Signed)
Reason for the request: Bladder cancer  HPI: I was asked by Dr. Karsten Ro to evaluate Mr. Chad Yu for diagnosis of bladder cancer.  He is a 68 year old man with history of hypertension diabetes.  He developed the symptoms of hematuria and an ultrasound in May of 2020 showed a bladder tumor.  Cystoscopy performed in the office which showed a 2 cm tumor of the floor of the bladder posterior the left ureteral orifice.  He has subsequently underwent transurethral resection of a bladder tumor completed by Dr. Karsten Ro on 08/31/2018.  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  Imaging studies including CT scan of the abdomen and pelvis obtained on September 07, 2018 showed posterior left bladder wall thickening without any lymphadenopathy.  Focal hypoattenuating lesion noted in the liver that represents focal fatty deposition although MRI was recommended.  CT scan of the chest was obtained on 09/19/2018 showed no evidence of metastatic disease.  Clinically, he reports no other complaints at this time.  He denies any pelvic pain discomfort.  No longer reporting any hematuria or dysuria.  He remains active and continues to work part-time.  His blood sugar is slightly elevated at times but overall under reasonable control.  He does not report any headaches, blurry vision, syncope or seizures. Does not report any fevers, chills or sweats.  Does not report any cough, wheezing or hemoptysis.  Does not report any chest pain, palpitation, orthopnea or leg edema.  Does not report any nausea, vomiting or abdominal pain.  Does not report any constipation or diarrhea.  Does not report any skeletal complaints.    Does not report frequency, urgency or hematuria.  Does not report any skin rashes or lesions. Does not report any heat or cold intolerance.  Does not report any lymphadenopathy or petechiae.  Does not report any anxiety or depression.  Remaining review of systems is negative.     Past Medical History:  Diagnosis Date  . Bladder tumor   . Coronary artery disease cardiologist-- dr [name] (per pt last office visit 10/ 2018)   s/p  PCI w/ DES x1 to diagonal branch of LAD  . DDD (degenerative disc disease), lumbar   . Degenerative joint disease   . History of concussion    per pt age 55 w/ brief loc--- no residual  . Hyperlipidemia   . Hypertension   . OA (osteoarthritis)   . S/P drug eluting coronary stent placement 10/24/2007   diagonal branch of LAD  . Type 2 diabetes mellitus treated with insulin ([place])    followed by pcp  . Varicose vein of leg   . Wears glasses   :  Past Surgical History:  Procedure Laterality Date  . APPENDECTOMY  age 60  . COLONOSCOPY  2012  . CORONARY ANGIOPLASTY  08-26-2010   dr [name]   previous stent patent,  proximal prior to previous stent 99% stenosis;  Unsuccessful multiple attempts PCI, unability to cross with a ballon;  medical management  . CORONARY ANGIOPLASTY WITH STENT PLACEMENT  10-23-2017  dr Darnell Level brodie   positive ischemia on myoview;   40% pLAD and D1 80%,  diagonal branch treated w/ PCI and DES   . EXCISION MIDLINE SUPERIOR THYOID ISTHMUS MASS  11-27-2008   dr Ernesto Rutherford   bening lipoma  . KNEE ARTHROSCOPY Left 01-22-2004   dr Percell Miller  . POSTERIOR LUMBAR FUSION  11-14-2012   dr Christella Noa @[facility]    L3 -- 5  . SHOULDER ARTHROSCOPY Right  2019  . SHOULDER ARTHROSCOPY WITH ROTATOR CUFF REPAIR AND SUBACROMIAL DECOMPRESSION Left 11-12-2009   dr Percell Miller  @[facility]    w/ Labral debridement and DCR  . TONSILLECTOMY  age 50  . TOTAL KNEE ARTHROPLASTY Left 02/17/2016   Procedure: TOTAL KNEE ARTHROPLASTY;  Surgeon: Ninetta Lights, MD;  Location: [facility];  Service: Orthopedics;  Laterality: Left;  . TOTAL KNEE ARTHROPLASTY Right 02-25-2004   dr Percell Miller  @[facility]   . TRANSURETHRAL RESECTION OF BLADDER TUMOR N/A 08/31/2018   Procedure: TRANSURETHRAL RESECTION OF BLADDER TUMOR (TURBT) WITH INSTILLATION OF POST OPERATIVE CHEMOTHERAPY;  Surgeon:  Kathie Rhodes, MD;  Location: [facility];  Service: Urology;  Laterality: N/A;  . VARICOSE VEIN SURGERY  1995  :   Current Outpatient Medications:  .  Aspirin-Salicylamide-Caffeine (BC FAST PAIN RELIEF) 650-195-33.3 MG PACK, Take 1 packet by mouth daily as needed (pain.)., Disp: , Rfl:  .  Cholecalciferol (VITAMIN D3 PO), Take 1 tablet by mouth daily., Disp: , Rfl:  .  diclofenac sodium (VOLTAREN) 1 % GEL, Apply 1 application topically at bedtime as needed (pain.)., Disp: , Rfl:  .  Dulaglutide (TRULICITY) 1.5 0000000 SOPN, Inject 1.5 mg into the skin every Sunday. , Disp: , Rfl:  .  empagliflozin (JARDIANCE) 25 MG TABS tablet, Take 25 mg by mouth daily., Disp: , Rfl:  .  GARLIC PO, Take 1 capsule by mouth daily., Disp: , Rfl:  .  Homeopathic Products (THERAWORX GLOVE + FOAM EX), Apply 1 application topically 2 (two) times daily as needed (leg cramps)., Disp: , Rfl:  .  HYDROcodone-acetaminophen (NORCO) 10-325 MG tablet, Take 1-2 tablets by mouth every 4 (four) hours as needed for moderate pain. Maximum dose per 24 hours - 8 pills, Disp: 20 tablet, Rfl: 0 .  insulin glargine (LANTUS) 100 UNIT/ML injection, Inject 48 Units into the skin at bedtime. , Disp: , Rfl:  .  metFORMIN (GLUCOPHAGE-XR) 500 MG 24 hr tablet, Take 500 mg by mouth daily with breakfast. , Disp: , Rfl:  .  Multiple Vitamin (MULTIVITAMIN WITH MINERALS) TABS tablet, Take 1 tablet by mouth daily. One-A-Day, Disp: , Rfl:  .  olmesartan (BENICAR) 40 MG tablet, Take 40 mg by mouth once a week. , Disp: , Rfl:  .  phenazopyridine (PYRIDIUM) 200 MG tablet, Take 1 tablet (200 mg total) by mouth 3 (three) times daily as needed for pain., Disp: 20 tablet, Rfl: 0 .  Potassium Citrate 15 MEQ (1620 MG) TBCR, Take 1 tablet by mouth 2 (two) times daily., Disp: , Rfl:  .  POTASSIUM PO, Take 1 tablet by mouth daily as needed (for leg cramps)., Disp: , Rfl:  .  tetrahydrozoline 0.05 % ophthalmic solution, Place 1 drop into both eyes at bedtime as  needed (eye irritation)., Disp: , Rfl: :  Allergies  Allergen Reactions  . Lisinopril Other (See Comments)    Severe cough Hair ball in throat  . Statins Hives and Other (See Comments)    Other reaction(s): Cough (ALLERGY/intolerance), Myalgias (intolerance) No cholesterol meds  . Atorvastatin Other (See Comments)    REACTION: myalgies  . Rosuvastatin Itching  :  Family History  Problem Relation Age of Onset  . Heart attack Father   . Hypertension Father   . Heart attack Brother   :  Social History   Socioeconomic History  . Marital status: Married    Spouse name: Not on file  . Number of children: 4  . Years of education: Not on file  . Highest education  level: Not on file  Occupational History  . 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 Financial resource strain: Not on file  . Food insecurity    Worry: Not on file    Inability: Not on file  . Transportation needs    Medical: Not on file    Non-medical: Not on file  Tobacco Use  . Smoking status: Never Smoker  . Smokeless tobacco: Never Used  Substance and Sexual Activity  . Alcohol use: No  . Drug use: No  . Sexual activity: Not on file  Lifestyle  . Physical activity    Days per week: Not on file    Minutes per session: Not on file  . Stress: Not on file  Relationships  . Social Herbalist on phone: Not on file    Gets together: Not on file    Attends religious service: Not on file    Active member of club or organization: Not on file    Attends meetings of clubs or organizations: Not on file    Relationship status: Not on file  . Intimate partner violence    Fear of current or ex partner: Not on file    Emotionally abused: Not on file    Physically abused: Not on file    Forced sexual activity: Not on file  Other Topics Concern  . Not on file  Social History Narrative   The patient lives in [place] with his wife. He works in Pharmacist, hospital. He denies tobacco or alcohol abuse . He denies regular exercise.  :  Pertinent items are noted in HPI.  Exam: Blood pressure 139/85, pulse 80, temperature 97.8 F (36.6 C), temperature source Oral, resp. rate 18, height 6' (1.829 m), weight 241 lb 9.6 oz (109.6 kg), SpO2 98 %.   ECOG 0 General appearance: alert and cooperative appeared without distress. Head: atraumatic without any abnormalities. Eyes: conjunctivae/corneas clear. PERRL.  Sclera anicteric. Throat: lips, mucosa, and tongue normal; without oral thrush or ulcers. Resp: clear to auscultation bilaterally without rhonchi, wheezes or dullness to percussion. Cardio: regular rate and rhythm, S1, S2 normal, no murmur, click, rub or gallop GI: soft, non-tender; bowel sounds normal; no masses,  no organomegaly Skin: Skin color, texture, turgor normal. No rashes or lesions Lymph nodes: Cervical, supraclavicular, and axillary nodes normal. Neurologic: Grossly normal without any motor, sensory or deep tendon reflexes. Musculoskeletal: No joint deformity or effusion.  CBC    Component Value Date/Time   WBC 5.7 08/23/2018 1158   RBC 5.03 08/23/2018 1158   HGB 15.6 08/23/2018 1158   HCT 47.4 08/23/2018 1158   PLT 193 08/23/2018 1158   MCV 94.2 08/23/2018 1158   MCH 31.0 08/23/2018 1158   MCHC 32.9 08/23/2018 1158   RDW 12.9 08/23/2018 1158   LYMPHSABS 1.9 08/25/2010 1608   MONOABS 0.7 08/25/2010 1608   EOSABS 0.2 08/25/2010 1608   BASOSABS 0.0 08/25/2010 1608     Chemistry      Component Value Date/Time   NA 137 08/23/2018 1158   K 4.9 08/23/2018 1158   CL 103 08/23/2018 1158   CO2 24 08/23/2018 1158   BUN 35 (H) 08/23/2018 1158   CREATININE 1.34 (H) 08/23/2018 1158      Component Value Date/Time   CALCIUM 9.3 08/23/2018 1158   ALKPHOS 46 12/16/2010 0007   AST 21 12/16/2010 0007   ALT 24 12/16/2010 0007   BILITOT 0.2 (  L) 12/16/2010 0007      Assessment and Plan:   68 year old  with:  1.  Bladder cancer diagnosed in August 2020.  Presented with hematuria found to have urothelial carcinoma of the bladder with invasion into the muscle documented in August 2020.  Imaging studies did not show any evidence of metastatic disease.  His MRI of the liver is still pending for questionable fatty deposits at this time.   The natural course of this disease was reviewed and treatment options were reiterated.  Radical cystectomy after neoadjuvant chemotherapy remains the standard of care at this time.  I feel he would be a reasonable candidate for this option.  Alternatively, definitive therapy with radiation via tri-modality approach is also a reasonable alternative.  He is interested in pursuing surgical option at this time.   The logistics and rationale for using chemotherapy was reviewed today in detail.  Complication associated with cisplatin and gemcitabine chemotherapy was discussed.  These complications include nausea, vomiting, myelosuppression, fatigue, infusion related complications, renal insufficiency, neutropenia, neutropenic sepsis and rarely serious thrombosis, hospitalization and death.  The benefit would also if he has an excellent response to chemotherapy, curative surgical resection may be attempted.  The plan is to treat with gemcitabine and cisplatin on day 1, gemcitabine day 8 out of a 21-day cycle.  Anticipate needing 3 to 4 cycles of therapy    After discussion today, he is agreeable to proceed after chemo education class.     2.  IV access: Risks and benefits of using Port-A-Cath versus peripheral veins was discussed today.  Complication associated with Port-A-Cath insertion include bleeding, infection and thrombosis.  After discussing the risks and benefits, we have recommended proceeding with peripheral veins at this time.   3.  Antiemetics: Prescription for Compazine was made available to him.   4.  Renal function surveillance: We will continue to monitor on  cisplatin therapy.  Baseline creatinine is adequate at this time.   5.  Goals of care:  Therapy remains curative with aggressive measures are warranted.   6.  Follow-up: will be in the immediate future to start chemotherapy.  60  minutes was spent with the patient face-to-face today.  More than 50% of time was spent on reviewing his disease status, reviewing imaging studies and coordinating future plan of care.    Thank you for the referral.  A copy of this consult has been forwarded to the requesting physician.

## 2018-09-20 ENCOUNTER — Ambulatory Visit (HOSPITAL_COMMUNITY)
Admission: RE | Admit: 2018-09-20 | Discharge: 2018-09-20 | Disposition: A | Discharge status: Home / Self Care | Payer: Medicare Other | Source: Ambulatory Visit | Attending: Urology | Admitting: Urology

## 2018-09-20 ENCOUNTER — Other Ambulatory Visit: Payer: Self-pay | Admitting: Urology

## 2018-09-20 DIAGNOSIS — D376 Neoplasm of uncertain behavior of liver, gallbladder and bile ducts: Secondary | ICD-10-CM

## 2018-09-20 LAB — POCT I-STAT CREATININE: Creatinine, Ser: 1.4 mg/dL — ABNORMAL HIGH (ref 0.61–1.24)

## 2018-09-20 MED ORDER — GADOBUTROL 1 MMOL/ML IV SOLN
10.0000 mL | Freq: Once | INTRAVENOUS | Status: AC | PRN
  Administered 2018-09-20: 10 mL via INTRAVENOUS

## 2018-09-21 ENCOUNTER — Telehealth: Payer: Self-pay | Admitting: Oncology

## 2018-09-21 NOTE — Telephone Encounter (Signed)
Called and spoke with patient. Confirmed upcoming appts  

## 2018-09-27 ENCOUNTER — Inpatient Hospital Stay: Payer: Medicare Other

## 2018-09-27 ENCOUNTER — Other Ambulatory Visit: Payer: Self-pay

## 2018-09-28 ENCOUNTER — Telehealth: Payer: Self-pay

## 2018-09-28 NOTE — Telephone Encounter (Signed)
Received call from patient clarifying appointment. Provided appointment date and times.

## 2018-10-03 ENCOUNTER — Encounter: Payer: Self-pay | Admitting: Oncology

## 2018-10-03 NOTE — Progress Notes (Signed)
Calledpt to introduce myself as his Conservator, museum/gallery to discuss copay assistance.  He gave me consent to apply in his behalf so I applied to the [employer] foundation and he was approved for $3,400 from6/18/20 to9/15/21.  I also informed him of the [place], went over what it covers and gave him the income requirement.  Unfortunately he's over the requirement so he doesn't qualify for the grant at this time.  I will give him my card for any questions or concerns he may have in the future.

## 2018-10-04 ENCOUNTER — Inpatient Hospital Stay: Payer: Medicare Other

## 2018-10-04 ENCOUNTER — Other Ambulatory Visit: Payer: Self-pay

## 2018-10-04 VITALS — BP 143/73 | HR 65 | Temp 97.9°F | Resp 18

## 2018-10-04 DIAGNOSIS — C678 Malignant neoplasm of overlapping sites of bladder: Secondary | ICD-10-CM

## 2018-10-04 DIAGNOSIS — Z5111 Encounter for antineoplastic chemotherapy: Secondary | ICD-10-CM | POA: Diagnosis not present

## 2018-10-04 DIAGNOSIS — C679 Malignant neoplasm of bladder, unspecified: Secondary | ICD-10-CM

## 2018-10-04 LAB — CMP (CANCER CENTER ONLY)
ALT: 24 U/L (ref 0–44)
AST: 16 U/L (ref 15–41)
Albumin: 3.7 g/dL (ref 3.5–5.0)
Alkaline Phosphatase: 72 U/L (ref 38–126)
Anion gap: 11 (ref 5–15)
BUN: 25 mg/dL — ABNORMAL HIGH (ref 8–23)
CO2: 23 mmol/L (ref 22–32)
Calcium: 8.6 mg/dL — ABNORMAL LOW (ref 8.9–10.3)
Chloride: 104 mmol/L (ref 98–111)
Creatinine: 1.31 mg/dL — ABNORMAL HIGH (ref 0.61–1.24)
GFR, Est AFR Am: >60 mL/min (ref >60)
GFR, Estimated: 56 mL/min — ABNORMAL LOW (ref >60)
Glucose, Bld: 377 mg/dL — ABNORMAL HIGH (ref 70–99)
Potassium: 4.4 mmol/L (ref 3.5–5.1)
Sodium: 138 mmol/L (ref 135–145)
Total Bilirubin: 0.4 mg/dL (ref 0.3–1.2)
Total Protein: 6.5 g/dL (ref 6.5–8.1)

## 2018-10-04 LAB — CBC WITH DIFFERENTIAL (CANCER CENTER ONLY)
Abs Immature Granulocytes: 0.03 10*3/uL (ref 0.00–0.07)
Basophils Absolute: 0 10*3/uL (ref 0.0–0.1)
Basophils Relative: 0 %
Eosinophils Absolute: 0.2 10*3/uL (ref 0.0–0.5)
Eosinophils Relative: 4 %
HCT: 39.4 % (ref 39.0–52.0)
Hemoglobin: 13.4 g/dL (ref 13.0–17.0)
Immature Granulocytes: 1 %
Lymphocytes Relative: 13 %
Lymphs Abs: 0.8 10*3/uL (ref 0.7–4.0)
MCH: 30.6 pg (ref 26.0–34.0)
MCHC: 34 g/dL (ref 30.0–36.0)
MCV: 90 fL (ref 80.0–100.0)
Monocytes Absolute: 0.6 10*3/uL (ref 0.1–1.0)
Monocytes Relative: 9 %
Neutro Abs: 4.7 10*3/uL (ref 1.7–7.7)
Neutrophils Relative %: 73 %
Platelet Count: 153 10*3/uL (ref 150–400)
RBC: 4.38 MIL/uL (ref 4.22–5.81)
RDW: 12.8 % (ref 11.5–15.5)
WBC Count: 6.3 10*3/uL (ref 4.0–10.5)
nRBC: 0 % (ref 0.0–0.2)

## 2018-10-04 LAB — MAGNESIUM: Magnesium: 2 mg/dL (ref 1.7–2.4)

## 2018-10-04 MED ORDER — SODIUM CHLORIDE 0.9 % IV SOLN
Freq: Once | INTRAVENOUS | Status: AC
  Administered 2018-10-04: 11:00:00 via INTRAVENOUS
  Filled 2018-10-04: qty 5

## 2018-10-04 MED ORDER — SODIUM CHLORIDE 0.9 % IV SOLN
1000.0000 mg/m2 | Freq: Once | INTRAVENOUS | Status: AC
  Administered 2018-10-04: 2356 mg via INTRAVENOUS
  Filled 2018-10-04: qty 61.96

## 2018-10-04 MED ORDER — PALONOSETRON HCL INJECTION 0.25 MG/5ML
INTRAVENOUS | Status: AC
  Filled 2018-10-04: qty 5

## 2018-10-04 MED ORDER — POTASSIUM CHLORIDE 2 MEQ/ML IV SOLN
Freq: Once | INTRAVENOUS | Status: AC
  Administered 2018-10-04: 09:00:00 via INTRAVENOUS
  Filled 2018-10-04: qty 10

## 2018-10-04 MED ORDER — SODIUM CHLORIDE 0.9 % IV SOLN
Freq: Once | INTRAVENOUS | Status: AC
  Administered 2018-10-04: 13:00:00 via INTRAVENOUS
  Filled 2018-10-04: qty 250

## 2018-10-04 MED ORDER — SODIUM CHLORIDE 0.9 % IV SOLN
70.0000 mg/m2 | Freq: Once | INTRAVENOUS | Status: AC
  Administered 2018-10-04: 165 mg via INTRAVENOUS
  Filled 2018-10-04: qty 165

## 2018-10-04 MED ORDER — SODIUM CHLORIDE 0.9 % IV SOLN
Freq: Once | INTRAVENOUS | Status: AC
  Administered 2018-10-04: 09:00:00 via INTRAVENOUS
  Filled 2018-10-04: qty 250

## 2018-10-04 MED ORDER — PALONOSETRON HCL INJECTION 0.25 MG/5ML
0.2500 mg | Freq: Once | INTRAVENOUS | Status: AC
  Administered 2018-10-04: 0.25 mg via INTRAVENOUS

## 2018-10-04 NOTE — Patient Instructions (Signed)
Coronavirus (COVID-19) Are you at risk?  Are you at risk for the Coronavirus (COVID-19)?  To be considered HIGH RISK for Coronavirus (COVID-19), you have to meet the following criteria:  . Traveled to [place], [place], [place], [place] or [place]; or in the [place] to [place], [place], [place], or [place]; and have fever, cough, and shortness of breath within the last 2 weeks of travel OR . Been in close contact with a person diagnosed with COVID-19 within the last 2 weeks and have fever, cough, and shortness of breath . IF YOU DO NOT MEET THESE CRITERIA, YOU ARE CONSIDERED LOW RISK FOR COVID-19.  What to do if you are HIGH RISK for COVID-19?  . If you are having a medical emergency, call 911. . Seek medical care right away. Before you go to a doctor's office, urgent care or emergency department, call ahead and tell them about your recent travel, contact with someone diagnosed with COVID-19, and your symptoms. You should receive instructions from your physician's office regarding next steps of care.  . When you arrive at healthcare provider, tell the healthcare staff immediately you have returned from visiting [place], [place], [place], [place] or [place]; or traveled in the [place] to [place], [place], [place], or [place]; in the last two weeks or you have been in close contact with a person diagnosed with COVID-19 in the last 2 weeks.   . Tell the health care staff about your symptoms: fever, cough and shortness of breath. . After you have been seen by a medical provider, you will be either: o Tested for (COVID-19) and discharged home on quarantine except to seek medical care if symptoms worsen, and asked to  - Stay home and avoid contact with others until you get your results (4-5 days)  - Avoid travel on public transportation if possible (such as bus, train, or airplane) or o Sent to the Emergency Department by EMS for evaluation, COVID-19 testing, and possible  admission depending on your condition and test results.  What to do if you are LOW RISK for COVID-19?  Reduce your risk of any infection by using the same precautions used for avoiding the common cold or flu:  . Wash your hands often with soap and warm water for at least 20 seconds.  If soap and water are not readily available, use an alcohol-based hand sanitizer with at least 60% alcohol.  . If coughing or sneezing, cover your mouth and nose by coughing or sneezing into the elbow areas of your shirt or coat, into a tissue or into your sleeve (not your hands). . Avoid shaking hands with others and consider head nods or verbal greetings only. . Avoid touching your eyes, nose, or mouth with unwashed hands.  . Avoid close contact with people who are sick. . Avoid places or events with large numbers of people in one location, like concerts or sporting events. . Carefully consider travel plans you have or are making. . If you are planning any travel outside or inside the [place], visit the CDC's Travelers' Health webpage for the latest health notices. . If you have some symptoms but not all symptoms, continue to monitor at home and seek medical attention if your symptoms worsen. . If you are having a medical emergency, call 911.   ADDITIONAL HEALTHCARE OPTIONS FOR PATIENTS  [facility] / e-Visit: [email or web address]         [facility]: [phone]  Onset   Urgent Care: [facility]: [place] Discharge Instructions for Patients Receiving Chemotherapy  Today you received the following chemotherapy agents :  Gemcitabine, Cisplatin.  To help prevent nausea and vomiting after your treatment, we encourage you to take your nausea medication as prescribed.  DO DRINK LOTS OF FLUIDS AS TOLERATED.   DO NOT EAT GREASY NOR SPICY FOODS. TAKE  COMPAZINE 10 MG  EVERY 6 HOURS AS NEEDED FOR NAUSEA.   THIS MEDICATION CAN CAUSE DROWSINESS -  DO NOT DRIVE AFTER TAKING THIS MEDICATION.    If you develop nausea and vomiting that is not controlled by your nausea medication, call the clinic.   BELOW ARE SYMPTOMS THAT SHOULD BE REPORTED IMMEDIATELY:  *FEVER GREATER THAN 100.5 F  *CHILLS WITH OR WITHOUT FEVER  NAUSEA AND VOMITING THAT IS NOT CONTROLLED WITH YOUR NAUSEA MEDICATION  *UNUSUAL SHORTNESS OF BREATH  *UNUSUAL BRUISING OR BLEEDING  TENDERNESS IN MOUTH AND THROAT WITH OR WITHOUT PRESENCE OF ULCERS  *URINARY PROBLEMS  *BOWEL PROBLEMS  UNUSUAL RASH Items with * indicate a potential emergency and should be followed up as soon as possible.  Feel free to call the clinic should you have any questions or concerns. The clinic phone number is [phone].  Please show the [facility] at check-in to the Emergency Department and triage nurse.

## 2018-10-05 ENCOUNTER — Telehealth: Payer: Self-pay | Admitting: *Deleted

## 2018-10-05 ENCOUNTER — Other Ambulatory Visit: Payer: Self-pay | Admitting: *Deleted

## 2018-10-05 NOTE — Telephone Encounter (Signed)
-----   Message from Jarvis Morgan, RN sent at 10/04/2018  5:15 PM EDT ----- Regarding: Chemo follow up First time Cisplatin, Gemzar,  Dr. Alen Blew, TB, RN.

## 2018-10-05 NOTE — Telephone Encounter (Signed)
Called pt to check on how he did with his treatment of Gemzar & Cisplatin yest.  He reports slight h/a which he attributed to elevated blood sugar but otherwise denies any other side effects.  He states he knows when to call & how to reach [place] & suggested he call with any concerns/questions.

## 2018-10-11 ENCOUNTER — Other Ambulatory Visit: Payer: Self-pay

## 2018-10-11 ENCOUNTER — Inpatient Hospital Stay: Payer: Medicare Other

## 2018-10-11 ENCOUNTER — Other Ambulatory Visit: Payer: Self-pay | Admitting: Lab

## 2018-10-11 VITALS — BP 137/90 | HR 74 | Temp 98.7°F | Resp 18

## 2018-10-11 DIAGNOSIS — C678 Malignant neoplasm of overlapping sites of bladder: Secondary | ICD-10-CM

## 2018-10-11 DIAGNOSIS — C679 Malignant neoplasm of bladder, unspecified: Secondary | ICD-10-CM

## 2018-10-11 DIAGNOSIS — Z5111 Encounter for antineoplastic chemotherapy: Secondary | ICD-10-CM | POA: Diagnosis not present

## 2018-10-11 LAB — CBC WITH DIFFERENTIAL (CANCER CENTER ONLY)
Abs Immature Granulocytes: 0 10*3/uL (ref 0.00–0.07)
Basophils Absolute: 0 10*3/uL (ref 0.0–0.1)
Basophils Relative: 1 %
Eosinophils Absolute: 0.1 10*3/uL (ref 0.0–0.5)
Eosinophils Relative: 3 %
HCT: 36.7 % — ABNORMAL LOW (ref 39.0–52.0)
Hemoglobin: 13 g/dL (ref 13.0–17.0)
Immature Granulocytes: 0 %
Lymphocytes Relative: 36 %
Lymphs Abs: 1 10*3/uL (ref 0.7–4.0)
MCH: 30.8 pg (ref 26.0–34.0)
MCHC: 35.4 g/dL (ref 30.0–36.0)
MCV: 87 fL (ref 80.0–100.0)
Monocytes Absolute: 0.1 10*3/uL (ref 0.1–1.0)
Monocytes Relative: 5 %
Neutro Abs: 1.5 10*3/uL — ABNORMAL LOW (ref 1.7–7.7)
Neutrophils Relative %: 55 %
Platelet Count: 98 10*3/uL — ABNORMAL LOW (ref 150–400)
RBC: 4.22 MIL/uL (ref 4.22–5.81)
RDW: 12.2 % (ref 11.5–15.5)
WBC Count: 2.7 10*3/uL — ABNORMAL LOW (ref 4.0–10.5)
nRBC: 0 % (ref 0.0–0.2)

## 2018-10-11 LAB — CMP (CANCER CENTER ONLY)
ALT: 50 U/L — ABNORMAL HIGH (ref 0–44)
AST: 22 U/L (ref 15–41)
Albumin: 3.8 g/dL (ref 3.5–5.0)
Alkaline Phosphatase: 72 U/L (ref 38–126)
Anion gap: 13 (ref 5–15)
BUN: 56 mg/dL — ABNORMAL HIGH (ref 8–23)
CO2: 23 mmol/L (ref 22–32)
Calcium: 8.5 mg/dL — ABNORMAL LOW (ref 8.9–10.3)
Chloride: 100 mmol/L (ref 98–111)
Creatinine: 2.83 mg/dL — ABNORMAL HIGH (ref 0.61–1.24)
GFR, Est AFR Am: 25 mL/min — ABNORMAL LOW (ref >60)
GFR, Estimated: 22 mL/min — ABNORMAL LOW (ref >60)
Glucose, Bld: 225 mg/dL — ABNORMAL HIGH (ref 70–99)
Potassium: 4 mmol/L (ref 3.5–5.1)
Sodium: 136 mmol/L (ref 135–145)
Total Bilirubin: 0.5 mg/dL (ref 0.3–1.2)
Total Protein: 6.7 g/dL (ref 6.5–8.1)

## 2018-10-11 LAB — MAGNESIUM: Magnesium: 1.8 mg/dL (ref 1.7–2.4)

## 2018-10-11 MED ORDER — SODIUM CHLORIDE 0.9 % IV SOLN
1000.0000 mg/m2 | Freq: Once | INTRAVENOUS | Status: AC
  Administered 2018-10-11: 2356 mg via INTRAVENOUS
  Filled 2018-10-11: qty 61.96

## 2018-10-11 MED ORDER — SODIUM CHLORIDE 0.9 % IV SOLN
Freq: Once | INTRAVENOUS | Status: AC
  Administered 2018-10-11: 09:00:00 via INTRAVENOUS
  Filled 2018-10-11: qty 250

## 2018-10-11 MED ORDER — PROCHLORPERAZINE MALEATE 10 MG PO TABS
ORAL_TABLET | ORAL | Status: AC
  Filled 2018-10-11: qty 1

## 2018-10-11 MED ORDER — PROCHLORPERAZINE MALEATE 10 MG PO TABS
10.0000 mg | ORAL_TABLET | Freq: Once | ORAL | Status: AC
  Administered 2018-10-11: 10 mg via ORAL

## 2018-10-11 NOTE — Patient Instructions (Signed)
[facility] °Discharge Instructions for Patients Receiving Chemotherapy ° °Today you received the following chemotherapy agents Gemzar ° °To help prevent nausea and vomiting after your treatment, we encourage you to take your nausea medication as directed. °  °If you develop nausea and vomiting that is not controlled by your nausea medication, call the clinic.  ° °BELOW ARE SYMPTOMS THAT SHOULD BE REPORTED IMMEDIATELY: °· *FEVER GREATER THAN 100.5 F °· *CHILLS WITH OR WITHOUT FEVER °· NAUSEA AND VOMITING THAT IS NOT CONTROLLED WITH YOUR NAUSEA MEDICATION °· *UNUSUAL SHORTNESS OF BREATH °· *UNUSUAL BRUISING OR BLEEDING °· TENDERNESS IN MOUTH AND THROAT WITH OR WITHOUT PRESENCE OF ULCERS °· *URINARY PROBLEMS °· *BOWEL PROBLEMS °· UNUSUAL RASH °Items with * indicate a potential emergency and should be followed up as soon as possible. ° °Feel free to call the clinic should you have any questions or concerns. The clinic phone number is [phone]. ° °Please show the CHEMO ALERT CARD at check-in to the Emergency Department and triage nurse. ° ° °

## 2018-10-11 NOTE — Progress Notes (Signed)
Per Dr. Alen Blew, Coin to treat with Creatinine 2.83 and PLT 98.

## 2018-10-15 ENCOUNTER — Telehealth: Payer: Self-pay

## 2018-10-15 NOTE — Telephone Encounter (Signed)
-----   Message from Wyatt Portela, MD sent at 10/15/2018 10:42 AM EDT ----- Regarding: RE: Patient Advice Request Please let him know that occasional blood-tinged urine is expected related to his bladder cancer and treatment.  It is not related to any pain medication and no intervention is needed.  This continue to watch and report if more brisk bleeding is noted.  Thanks ----- Message ----- From: Teodoro Spray, RN Sent: 10/15/2018  10:22 AM EDT To: Wyatt Portela, MD Subject: Patient Advice Request                         Pt states he had blood in urine over the weekend. Stated it happened only once on Friday night, stated  "it looked like rose". Patient denied pain while urinating and has not had any other occurrence. Pt stated he had taken a pain pill 2 hours prior to occurrence and was concerned this may be the cause of it.  Please advise.

## 2018-10-15 NOTE — Telephone Encounter (Signed)
Called and informed patient of information from Dr. Alen Blew stated below. Patient verbalized understanding.

## 2018-10-15 NOTE — Telephone Encounter (Signed)
Called patient for follow-up. Patient had called triage line over the weekend stating he had blood in his urine. Left message for patient to call office back.

## 2018-10-19 NOTE — Telephone Encounter (Signed)
Opened in error

## 2018-10-24 ENCOUNTER — Inpatient Hospital Stay: Payer: Medicare Other

## 2018-10-24 ENCOUNTER — Inpatient Hospital Stay: Payer: Medicare Other | Attending: Oncology | Admitting: Oncology

## 2018-10-24 ENCOUNTER — Other Ambulatory Visit: Payer: Self-pay

## 2018-10-24 VITALS — BP 138/73 | HR 84 | Temp 98.2°F | Resp 18 | Ht 72.0 in | Wt 233.2 lb

## 2018-10-24 DIAGNOSIS — C679 Malignant neoplasm of bladder, unspecified: Secondary | ICD-10-CM

## 2018-10-24 DIAGNOSIS — C678 Malignant neoplasm of overlapping sites of bladder: Secondary | ICD-10-CM

## 2018-10-24 DIAGNOSIS — Z5111 Encounter for antineoplastic chemotherapy: Secondary | ICD-10-CM | POA: Diagnosis present

## 2018-10-24 DIAGNOSIS — Z23 Encounter for immunization: Secondary | ICD-10-CM | POA: Insufficient documentation

## 2018-10-24 LAB — CBC WITH DIFFERENTIAL (CANCER CENTER ONLY)
Abs Immature Granulocytes: 0.04 10*3/uL (ref 0.00–0.07)
Basophils Absolute: 0 10*3/uL (ref 0.0–0.1)
Basophils Relative: 1 %
Eosinophils Absolute: 0.1 10*3/uL (ref 0.0–0.5)
Eosinophils Relative: 4 %
HCT: 30.8 % — ABNORMAL LOW (ref 39.0–52.0)
Hemoglobin: 10.7 g/dL — ABNORMAL LOW (ref 13.0–17.0)
Immature Granulocytes: 1 %
Lymphocytes Relative: 27 %
Lymphs Abs: 0.8 10*3/uL (ref 0.7–4.0)
MCH: 30.1 pg (ref 26.0–34.0)
MCHC: 34.7 g/dL (ref 30.0–36.0)
MCV: 86.8 fL (ref 80.0–100.0)
Monocytes Absolute: 0.6 10*3/uL (ref 0.1–1.0)
Monocytes Relative: 21 %
Neutro Abs: 1.4 10*3/uL — ABNORMAL LOW (ref 1.7–7.7)
Neutrophils Relative %: 46 %
Platelet Count: 375 10*3/uL (ref 150–400)
RBC: 3.55 MIL/uL — ABNORMAL LOW (ref 4.22–5.81)
RDW: 11.9 % (ref 11.5–15.5)
WBC Count: 2.9 10*3/uL — ABNORMAL LOW (ref 4.0–10.5)
nRBC: 0 % (ref 0.0–0.2)

## 2018-10-24 LAB — CMP (CANCER CENTER ONLY)
ALT: 24 U/L (ref 0–44)
AST: 20 U/L (ref 15–41)
Albumin: 3.9 g/dL (ref 3.5–5.0)
Alkaline Phosphatase: 67 U/L (ref 38–126)
Anion gap: 10 (ref 5–15)
BUN: 35 mg/dL — ABNORMAL HIGH (ref 8–23)
CO2: 24 mmol/L (ref 22–32)
Calcium: 9.3 mg/dL (ref 8.9–10.3)
Chloride: 104 mmol/L (ref 98–111)
Creatinine: 2.1 mg/dL — ABNORMAL HIGH (ref 0.61–1.24)
GFR, Est AFR Am: 36 mL/min — ABNORMAL LOW (ref >60)
GFR, Estimated: 31 mL/min — ABNORMAL LOW (ref >60)
Glucose, Bld: 181 mg/dL — ABNORMAL HIGH (ref 70–99)
Potassium: 4.8 mmol/L (ref 3.5–5.1)
Sodium: 138 mmol/L (ref 135–145)
Total Bilirubin: 0.3 mg/dL (ref 0.3–1.2)
Total Protein: 6.9 g/dL (ref 6.5–8.1)

## 2018-10-24 LAB — MAGNESIUM: Magnesium: 1.7 mg/dL (ref 1.7–2.4)

## 2018-10-24 NOTE — Progress Notes (Signed)
Hematology and Oncology Follow Up Visit  Chad Yu [id number] 02-15-1950 68 y.o. 10/24/2018 9:44 AM [name], MDSun, Chad Hawthorne, MD   Principle Diagnosis: 68 year old man with T2N0 urothelial carcinoma of the bladder diagnosed in August 2020.  He has no evidence of metastatic disease time of diagnosis.   Prior Therapy:   He is status post TURBT completed in August 2020 which showed high-grade urothelial carcinoma with muscle invasion.  Current therapy:  He is receiving neoadjuvant chemotherapy of cisplatin and gemcitabine started on 10/04/2018.  He received gemcitabine 1000 mg per metered square, cisplatin 70 mg per metered square on day 1 and gemcitabine 1000 mg per metered square on day 8 out of 21-day cycle.  He is here for cycle 2 of therapy.  Interim History: Chad Yu returns today for a follow-up visit.  He completed the first cycle of chemotherapy with few complications.  He reported some mild nausea but no vomiting.  He has reported decline in his appetite but his energy and performance status remains excellent.  He denies any future related complications.  He denies any hospitalization or illnesses.  He denies any worsening neuropathy.  Continues to be active and attends to activities of daily living.   Patient denied any alteration mental status, neuropathy, confusion or dizziness.  Denies any headaches or lethargy.  Denies any night sweats, weight loss or changes in appetite.  Denied orthopnea, dyspnea on exertion or chest discomfort.  Denies shortness of breath, difficulty breathing hemoptysis or cough.  Denies any abdominal distention, nausea, early satiety or dyspepsia.  Denies any hematuria, frequency, dysuria or nocturia.  Denies any skin irritation, dryness or rash.  Denies any ecchymosis or petechiae.  Denies any lymphadenopathy or clotting.  Denies any heat or cold intolerance.  Denies any anxiety or depression.  Remaining review of system is negative.     Medications: I  have reviewed the patient's current medications.  Current Outpatient Medications  Medication Sig Dispense Refill  . Aspirin-Salicylamide-Caffeine (BC FAST PAIN RELIEF) [id number] MG PACK Take 1 packet by mouth daily as needed (pain.).    Marland Kitchen Cholecalciferol (VITAMIN D3 PO) Take 1 tablet by mouth daily.    . diclofenac sodium (VOLTAREN) 1 % GEL Apply 1 application topically at bedtime as needed (pain.).    Marland Kitchen Dulaglutide (TRULICITY) 1.5 0000000 SOPN Inject 1.5 mg into the skin every Sunday.     . empagliflozin (JARDIANCE) 25 MG TABS tablet Take 25 mg by mouth daily.    Marland Kitchen GARLIC PO Take 1 capsule by mouth daily.    . Homeopathic Products (THERAWORX GLOVE + FOAM EX) Apply 1 application topically 2 (two) times daily as needed (leg cramps).    Marland Kitchen HYDROcodone-acetaminophen (NORCO) 10-325 MG tablet Take 1-2 tablets by mouth every 4 (four) hours as needed for moderate pain. Maximum dose per 24 hours - 8 pills 20 tablet 0  . insulin glargine (LANTUS) 100 UNIT/ML injection Inject 48 Units into the skin at bedtime.     . metFORMIN (GLUCOPHAGE-XR) 500 MG 24 hr tablet Take 500 mg by mouth daily with breakfast.     . Multiple Vitamin (MULTIVITAMIN WITH MINERALS) TABS tablet Take 1 tablet by mouth daily. One-A-Day    . olmesartan (BENICAR) 40 MG tablet Take 40 mg by mouth once a week.     . phenazopyridine (PYRIDIUM) 200 MG tablet Take 1 tablet (200 mg total) by mouth 3 (three) times daily as needed for pain. 20 tablet 0  . Potassium Citrate 15 MEQ (1620 MG)  TBCR Take 1 tablet by mouth 2 (two) times daily.    Marland Kitchen POTASSIUM PO Take 1 tablet by mouth daily as needed (for leg cramps).    . prochlorperazine (COMPAZINE) 10 MG tablet Take 1 tablet (10 mg total) by mouth every 6 (six) hours as needed for nausea or vomiting. 30 tablet 0  . tetrahydrozoline 0.05 % ophthalmic solution Place 1 drop into both eyes at bedtime as needed (eye irritation).     No current facility-administered medications for this visit.       Allergies:  Allergies  Allergen Reactions  . Lisinopril Other (See Comments)    Severe cough Hair ball in throat  . Statins Hives and Other (See Comments)    Other reaction(s): Cough (ALLERGY/intolerance), Myalgias (intolerance) No cholesterol meds  . Atorvastatin Other (See Comments)    REACTION: myalgies  . Rosuvastatin Itching    Past Medical History, Surgical history, Social history, and Family History were reviewed and updated.  Review of Systems:  Remaining ROS negative.  Physical Exam: Blood pressure 138/73, pulse 84, temperature 98.2 F (36.8 C), temperature source Oral, resp. rate 18, height 6' (1.829 m), weight 233 lb 3.2 oz (105.8 kg), SpO2 100 %. ECOG: 0    General appearance: Comfortable appearing without any discomfort Head: Normocephalic without any trauma Oropharynx: Mucous membranes are moist and pink without any thrush or ulcers. Eyes: Pupils are equal and round reactive to light. Lymph nodes: No cervical, supraclavicular, inguinal or axillary lymphadenopathy.   Heart:regular rate and rhythm.  S1 and S2 without leg edema. Lung: Clear without any rhonchi or wheezes.  No dullness to percussion. Abdomin: Soft, nontender, nondistended with good bowel sounds.  No hepatosplenomegaly. Musculoskeletal: No joint deformity or effusion.  Full range of motion noted. Neurological: No deficits noted on motor, sensory and deep tendon reflex exam. Skin: No petechial rash or dryness.  Appeared moist.  e.    Lab Results: Lab Results  Component Value Date   WBC 2.9 (L) 10/24/2018   HGB 10.7 (L) 10/24/2018   HCT 30.8 (L) 10/24/2018   MCV 86.8 10/24/2018   PLT 375 10/24/2018     Chemistry      Component Value Date/Time   NA 136 10/11/2018 0753   K 4.0 10/11/2018 0753   CL 100 10/11/2018 0753   CO2 23 10/11/2018 0753   BUN 56 (H) 10/11/2018 0753   CREATININE 2.83 (H) 10/11/2018 0753      Component Value Date/Time   CALCIUM 8.5 (L) 10/11/2018 0753    ALKPHOS 72 10/11/2018 0753   AST 22 10/11/2018 0753   ALT 50 (H) 10/11/2018 0753   BILITOT 0.5 10/11/2018 0753      Impression and Plan:  68 year old with:  1.    T2N0 bladder cancer diagnosed in August 2020.  He was found to have high-grade urothelial carcinoma without any evidence of metastatic disease.   He is currently receiving neoadjuvant chemotherapy without any major complications.  Risks and benefits of completing 3 cycles of neoadjuvant therapy and proceeding with radical cystectomy was reviewed today.  Potential complication associated with chemotherapy was reiterated including nausea, vomiting, myelosuppression and worsening renal insufficiency.  At this time he is agreeable to continue.  We will make dose adjustments or cisplatin based on his kidney function.  I urged him to follow-up on his appointment with Dr. Tresa Moore for consideration for radical cystectomy.  Anticipate will be complete chemotherapy first week of November likely be ready to proceed with surgery in December.  2. IV access: Peripheral veins are currently in use without any issues.  3. Antiemetics: He is using Compazine which has been effective in treating his nausea.  4. Renal function surveillance:His baseline kidney function was adequate although his creatinine did increase after the first cycle of therapy with a creatinine clearance of close to 22 cc/min.  His creatinine today continues to be elevated with creatinine clearance of about 31 cc/min.  We will dose reduce cisplatin by 50% at this time.  Will consider holding cisplatin altogether if no improvement in his creatinine clearance before the next cycle.  5.  Neutropenia: Related to chemotherapy and will adjust dosing at this time with the cisplatin and gemcitabine dose reduction.  His gemcitabine will be at 800 mg per metered square.  5. Goals of care:  His disease remains curable and aggressive measures are warranted.  6. Follow-up:   He will return on October 25, 2018 for day 1 of cycle 2 of therapy.  11 MD evaluation 3 weeks.  60  minutes was spent with the patient face-to-face today.  More than 50% of time was dedicated to reviewing his disease status, treatment options, complications of therapy and answering questions regarding future plan of care.   Zola Button, MD 10/7/20209:44 AM

## 2018-10-25 ENCOUNTER — Inpatient Hospital Stay: Payer: Medicare Other

## 2018-10-25 ENCOUNTER — Other Ambulatory Visit: Payer: Self-pay

## 2018-10-25 VITALS — BP 135/88 | HR 74 | Temp 98.0°F | Resp 17

## 2018-10-25 DIAGNOSIS — C678 Malignant neoplasm of overlapping sites of bladder: Secondary | ICD-10-CM

## 2018-10-25 DIAGNOSIS — Z5111 Encounter for antineoplastic chemotherapy: Secondary | ICD-10-CM | POA: Diagnosis not present

## 2018-10-25 MED ORDER — PALONOSETRON HCL INJECTION 0.25 MG/5ML
0.2500 mg | Freq: Once | INTRAVENOUS | Status: AC
  Administered 2018-10-25: 0.25 mg via INTRAVENOUS

## 2018-10-25 MED ORDER — SODIUM CHLORIDE 0.9 % IV SOLN
Freq: Once | INTRAVENOUS | Status: AC
  Administered 2018-10-25: 09:00:00 via INTRAVENOUS
  Filled 2018-10-25: qty 250

## 2018-10-25 MED ORDER — POTASSIUM CHLORIDE 2 MEQ/ML IV SOLN
Freq: Once | INTRAVENOUS | Status: AC
  Administered 2018-10-25: 09:00:00 via INTRAVENOUS
  Filled 2018-10-25: qty 10

## 2018-10-25 MED ORDER — SODIUM CHLORIDE 0.9 % IV SOLN
800.0000 mg/m2 | Freq: Once | INTRAVENOUS | Status: AC
  Administered 2018-10-25: 1900 mg via INTRAVENOUS
  Filled 2018-10-25: qty 49.97

## 2018-10-25 MED ORDER — SODIUM CHLORIDE 0.9 % IV SOLN
Freq: Once | INTRAVENOUS | Status: AC
  Administered 2018-10-25: 12:00:00 via INTRAVENOUS
  Filled 2018-10-25: qty 5

## 2018-10-25 MED ORDER — PALONOSETRON HCL INJECTION 0.25 MG/5ML
INTRAVENOUS | Status: AC
  Filled 2018-10-25: qty 5

## 2018-10-25 MED ORDER — SODIUM CHLORIDE 0.9 % IV SOLN
35.0000 mg/m2 | Freq: Once | INTRAVENOUS | Status: AC
  Administered 2018-10-25: 13:00:00 83 mg via INTRAVENOUS
  Filled 2018-10-25: qty 83

## 2018-10-25 NOTE — Progress Notes (Signed)
Per Dr Alen Blew ok to tx today with ANC 1.4 and creatinine 2.1.

## 2018-10-25 NOTE — Patient Instructions (Signed)
[facility] Discharge Instructions for Patients Receiving Chemotherapy  Today you received the following chemotherapy agents :  Gemcitabine, Cisplatin.  To help prevent nausea and vomiting after your treatment, we encourage you to take your nausea medication as prescribed.  DO DRINK LOTS OF FLUIDS AS TOLERATED.   DO NOT EAT GREASY NOR SPICY FOODS. TAKE  COMPAZINE 10 MG EVERY 6 HOURS AS NEEDED FOR NAUSEA.   THIS MEDICATION CAN CAUSE DROWSINESS -  DO NOT DRIVE AFTER TAKING THIS MEDICATION.    If you develop nausea and vomiting that is not controlled by your nausea medication, call the clinic.   BELOW ARE SYMPTOMS THAT SHOULD BE REPORTED IMMEDIATELY:  *FEVER GREATER THAN 100.5 F  *CHILLS WITH OR WITHOUT FEVER  NAUSEA AND VOMITING THAT IS NOT CONTROLLED WITH YOUR NAUSEA MEDICATION  *UNUSUAL SHORTNESS OF BREATH  *UNUSUAL BRUISING OR BLEEDING  TENDERNESS IN MOUTH AND THROAT WITH OR WITHOUT PRESENCE OF ULCERS  *URINARY PROBLEMS  *BOWEL PROBLEMS  UNUSUAL RASH Items with * indicate a potential emergency and should be followed up as soon as possible.  Feel free to call the clinic should you have any questions or concerns. The clinic phone number is [phone].  Please show the [facility] at check-in to the Emergency Department and triage nurse.  Coronavirus (COVID-19) Are you at risk?  Are you at risk for the Coronavirus (COVID-19)?  To be considered HIGH RISK for Coronavirus (COVID-19), you have to meet the following criteria:  . Traveled to [place], [place], [place], [place] or [place]; or in the [place] to [place], [place], [place], or [place]; and have fever, cough, and shortness of breath within the last 2 weeks of travel OR . Been in close contact with a person diagnosed with COVID-19 within the last 2 weeks and have fever, cough, and shortness of breath . IF YOU DO NOT MEET THESE CRITERIA, YOU ARE CONSIDERED LOW RISK FOR  COVID-19.  What to do if you are HIGH RISK for COVID-19?  Marland Kitchen If you are having a medical emergency, call 911. . Seek medical care right away. Before you go to a doctor's office, urgent care or emergency department, call ahead and tell them about your recent travel, contact with someone diagnosed with COVID-19, and your symptoms. You should receive instructions from your physician's office regarding next steps of care.  . When you arrive at healthcare provider, tell the healthcare staff immediately you have returned from visiting [place], [place], [place], [place] or [place]; or traveled in the [place] to [place], [place], [place], or [place]; in the last two weeks or you have been in close contact with a person diagnosed with COVID-19 in the last 2 weeks.   . Tell the health care staff about your symptoms: fever, cough and shortness of breath. . After you have been seen by a medical provider, you will be either: o Tested for (COVID-19) and discharged home on quarantine except to seek medical care if symptoms worsen, and asked to  - Stay home and avoid contact with others until you get your results (4-5 days)  - Avoid travel on public transportation if possible (such as bus, train, or airplane) or o Sent to the Emergency Department by EMS for evaluation, COVID-19 testing, and possible admission depending on your condition and test results.  What to do if you are LOW RISK for COVID-19?  Reduce your risk of any infection by using the same  precautions used for avoiding the common cold or flu:  Marland Kitchen Wash your hands often with soap and warm water for at least 20 seconds.  If soap and water are not readily available, use an alcohol-based hand sanitizer with at least 60% alcohol.  . If coughing or sneezing, cover your mouth and nose by coughing or sneezing into the elbow areas of your shirt or coat, into a tissue or into your sleeve (not your hands). . Avoid shaking hands with others and consider  head nods or verbal greetings only. . Avoid touching your eyes, nose, or mouth with unwashed hands.  . Avoid close contact with people who are sick. . Avoid places or events with large numbers of people in one location, like concerts or sporting events. . Carefully consider travel plans you have or are making. . If you are planning any travel outside or inside the [place], visit the CDC's Travelers' Health webpage for the latest health notices. . If you have some symptoms but not all symptoms, continue to monitor at home and seek medical attention if your symptoms worsen. . If you are having a medical emergency, call 911.   [place] / e-Visit: [email or web address]         [facility]: [facility]: [id number]                   [facility]: [phone]

## 2018-10-29 ENCOUNTER — Telehealth: Payer: Self-pay | Admitting: Oncology

## 2018-10-29 NOTE — Telephone Encounter (Signed)
Scheduled per sch msg. Called and spoke with patient. Confirmed appts  

## 2018-11-01 ENCOUNTER — Other Ambulatory Visit: Payer: Self-pay

## 2018-11-01 ENCOUNTER — Inpatient Hospital Stay: Payer: Medicare Other

## 2018-11-01 VITALS — BP 163/72 | HR 80 | Temp 98.3°F | Resp 16 | Wt 238.0 lb

## 2018-11-01 DIAGNOSIS — Z5111 Encounter for antineoplastic chemotherapy: Secondary | ICD-10-CM | POA: Diagnosis not present

## 2018-11-01 DIAGNOSIS — C678 Malignant neoplasm of overlapping sites of bladder: Secondary | ICD-10-CM

## 2018-11-01 DIAGNOSIS — C679 Malignant neoplasm of bladder, unspecified: Secondary | ICD-10-CM

## 2018-11-01 LAB — CMP (CANCER CENTER ONLY)
ALT: 20 U/L (ref 0–44)
AST: 16 U/L (ref 15–41)
Albumin: 3.5 g/dL (ref 3.5–5.0)
Alkaline Phosphatase: 60 U/L (ref 38–126)
Anion gap: 13 (ref 5–15)
BUN: 41 mg/dL — ABNORMAL HIGH (ref 8–23)
CO2: 22 mmol/L (ref 22–32)
Calcium: 8.8 mg/dL — ABNORMAL LOW (ref 8.9–10.3)
Chloride: 103 mmol/L (ref 98–111)
Creatinine: 1.64 mg/dL — ABNORMAL HIGH (ref 0.61–1.24)
GFR, Est AFR Am: 49 mL/min — ABNORMAL LOW (ref >60)
GFR, Estimated: 42 mL/min — ABNORMAL LOW (ref >60)
Glucose, Bld: 200 mg/dL — ABNORMAL HIGH (ref 70–99)
Potassium: 4.1 mmol/L (ref 3.5–5.1)
Sodium: 138 mmol/L (ref 135–145)
Total Bilirubin: <0.2 mg/dL — ABNORMAL LOW (ref 0.3–1.2)
Total Protein: 6.3 g/dL — ABNORMAL LOW (ref 6.5–8.1)

## 2018-11-01 LAB — CBC WITH DIFFERENTIAL (CANCER CENTER ONLY)
Abs Immature Granulocytes: 0.19 10*3/uL — ABNORMAL HIGH (ref 0.00–0.07)
Basophils Absolute: 0 10*3/uL (ref 0.0–0.1)
Basophils Relative: 2 %
Eosinophils Absolute: 0.1 10*3/uL (ref 0.0–0.5)
Eosinophils Relative: 2 %
HCT: 27.2 % — ABNORMAL LOW (ref 39.0–52.0)
Hemoglobin: 9.7 g/dL — ABNORMAL LOW (ref 13.0–17.0)
Immature Granulocytes: 7 %
Lymphocytes Relative: 43 %
Lymphs Abs: 1.2 10*3/uL (ref 0.7–4.0)
MCH: 31.2 pg (ref 26.0–34.0)
MCHC: 35.7 g/dL (ref 30.0–36.0)
MCV: 87.5 fL (ref 80.0–100.0)
Monocytes Absolute: 0.2 10*3/uL (ref 0.1–1.0)
Monocytes Relative: 7 %
Neutro Abs: 1.1 10*3/uL — ABNORMAL LOW (ref 1.7–7.7)
Neutrophils Relative %: 39 %
Platelet Count: 304 10*3/uL (ref 150–400)
RBC: 3.11 MIL/uL — ABNORMAL LOW (ref 4.22–5.81)
RDW: 12.4 % (ref 11.5–15.5)
WBC Count: 2.7 10*3/uL — ABNORMAL LOW (ref 4.0–10.5)
nRBC: 0 % (ref 0.0–0.2)

## 2018-11-01 LAB — MAGNESIUM: Magnesium: 1.7 mg/dL (ref 1.7–2.4)

## 2018-11-01 MED ORDER — SODIUM CHLORIDE 0.9 % IV SOLN
800.0000 mg/m2 | Freq: Once | INTRAVENOUS | Status: AC
  Administered 2018-11-01: 1900 mg via INTRAVENOUS
  Filled 2018-11-01: qty 49.97

## 2018-11-01 MED ORDER — INFLUENZA VAC A&B SA ADJ QUAD 0.5 ML IM PRSY
0.5000 mL | PREFILLED_SYRINGE | Freq: Once | INTRAMUSCULAR | Status: AC
  Administered 2018-11-01: 0.5 mL via INTRAMUSCULAR

## 2018-11-01 MED ORDER — PROCHLORPERAZINE MALEATE 10 MG PO TABS
ORAL_TABLET | ORAL | Status: AC
  Filled 2018-11-01: qty 1

## 2018-11-01 MED ORDER — INFLUENZA VAC A&B SA ADJ QUAD 0.5 ML IM PRSY
PREFILLED_SYRINGE | INTRAMUSCULAR | Status: AC
  Filled 2018-11-01: qty 0.5

## 2018-11-01 MED ORDER — SODIUM CHLORIDE 0.9 % IV SOLN
Freq: Once | INTRAVENOUS | Status: DC
  Filled 2018-11-01: qty 250

## 2018-11-01 MED ORDER — SODIUM CHLORIDE 0.9 % IV SOLN
Freq: Once | INTRAVENOUS | Status: AC
  Administered 2018-11-01: 09:00:00 via INTRAVENOUS
  Filled 2018-11-01: qty 250

## 2018-11-01 MED ORDER — PROCHLORPERAZINE MALEATE 10 MG PO TABS
10.0000 mg | ORAL_TABLET | Freq: Once | ORAL | Status: AC
  Administered 2018-11-01: 09:00:00 10 mg via ORAL

## 2018-11-01 NOTE — Patient Instructions (Signed)
[facility] Discharge Instructions for Patients Receiving Chemotherapy  Today you received the following chemotherapy agents :  Gemcitabine  To help prevent nausea and vomiting after your treatment, we encourage you to take your nausea medication as prescribed.  DO DRINK LOTS OF FLUIDS AS TOLERATED.   DO NOT EAT GREASY NOR SPICY FOODS. TAKE  COMPAZINE 10 MG EVERY 6 HOURS AS NEEDED FOR NAUSEA.   THIS MEDICATION CAN CAUSE DROWSINESS -  DO NOT DRIVE AFTER TAKING THIS MEDICATION.    If you develop nausea and vomiting that is not controlled by your nausea medication, call the clinic.   BELOW ARE SYMPTOMS THAT SHOULD BE REPORTED IMMEDIATELY:  *FEVER GREATER THAN 100.5 F  *CHILLS WITH OR WITHOUT FEVER  NAUSEA AND VOMITING THAT IS NOT CONTROLLED WITH YOUR NAUSEA MEDICATION  *UNUSUAL SHORTNESS OF BREATH  *UNUSUAL BRUISING OR BLEEDING  TENDERNESS IN MOUTH AND THROAT WITH OR WITHOUT PRESENCE OF ULCERS  *URINARY PROBLEMS  *BOWEL PROBLEMS  UNUSUAL RASH Items with * indicate a potential emergency and should be followed up as soon as possible.  Feel free to call the clinic should you have any questions or concerns. The clinic phone number is [phone].  Please show the [place] at check-in to the Emergency Department and triage nurse.  Coronavirus (COVID-19) Are you at risk?  Are you at risk for the Coronavirus (COVID-19)?  To be considered HIGH RISK for Coronavirus (COVID-19), you have to meet the following criteria:  . Traveled to [place], [place], [place], [place] or [place]; or in the [place] to [place], [place], [place], or [place]; and have fever, cough, and shortness of breath within the last 2 weeks of travel OR . Been in close contact with a person diagnosed with COVID-19 within the last 2 weeks and have fever, cough, and shortness of breath . IF YOU DO NOT MEET THESE CRITERIA, YOU ARE CONSIDERED LOW RISK FOR COVID-19.  What to do  if you are HIGH RISK for COVID-19?  Marland Kitchen If you are having a medical emergency, call 911. . Seek medical care right away. Before you go to a doctor's office, urgent care or emergency department, call ahead and tell them about your recent travel, contact with someone diagnosed with COVID-19, and your symptoms. You should receive instructions from your physician's office regarding next steps of care.  . When you arrive at healthcare provider, tell the healthcare staff immediately you have returned from visiting [place], [place], [place], [place] or [place]; or traveled in the [place] to [place], [place], [place], or [place]; in the last two weeks or you have been in close contact with a person diagnosed with COVID-19 in the last 2 weeks.   . Tell the health care staff about your symptoms: fever, cough and shortness of breath. . After you have been seen by a medical provider, you will be either: o Tested for (COVID-19) and discharged home on quarantine except to seek medical care if symptoms worsen, and asked to  - Stay home and avoid contact with others until you get your results (4-5 days)  - Avoid travel on public transportation if possible (such as bus, train, or airplane) or o Sent to the Emergency Department by EMS for evaluation, COVID-19 testing, and possible admission depending on your condition and test results.  What to do if you are LOW RISK for COVID-19?  Reduce your risk of any infection by using the same precautions  used for avoiding the common cold or flu:  Marland Kitchen Wash your hands often with soap and warm water for at least 20 seconds.  If soap and water are not readily available, use an alcohol-based hand sanitizer with at least 60% alcohol.  . If coughing or sneezing, cover your mouth and nose by coughing or sneezing into the elbow areas of your shirt or coat, into a tissue or into your sleeve (not your hands). . Avoid shaking hands with others and consider head nods or verbal  greetings only. . Avoid touching your eyes, nose, or mouth with unwashed hands.  . Avoid close contact with people who are sick. . Avoid places or events with large numbers of people in one location, like concerts or sporting events. . Carefully consider travel plans you have or are making. . If you are planning any travel outside or inside the [place], visit the CDC's Travelers' Health webpage for the latest health notices. . If you have some symptoms but not all symptoms, continue to monitor at home and seek medical attention if your symptoms worsen. . If you are having a medical emergency, call 911.   [place] / e-Visit: [email or web address]         [facility]: [facility]: [id number]                   [facility]: [phone]

## 2018-11-01 NOTE — Progress Notes (Signed)
Per Dr. Alen Blew okay for patient to receive treatment today with ANC 1.1 and creatine 1.64.

## 2018-11-08 ENCOUNTER — Telehealth: Payer: Self-pay | Admitting: Cardiovascular Disease

## 2018-11-08 NOTE — Telephone Encounter (Signed)
I have never seen this pt in clinic and he does not take Repatha per his medication list. It looks as though his lipids are followed by his PCP and he is also in a clinical trial - would recommend he follow up with them.

## 2018-11-08 NOTE — Telephone Encounter (Signed)
°*  STAT* If patient is at the pharmacy, call can be transferred to refill team.   1. Which medications need to be refilled? (please list name of each medication and dose if known)  New prescription for Rapatha  2. Which pharmacy/location (including street and city if local pharmacy) is medication to be sent to? [facility] [address], [place]  3. Do they need a 30 day or 90 day supply? 1 pack

## 2018-11-08 NOTE — Telephone Encounter (Signed)
Called pt to inform him per Jinny Blossom, [facility] that pt needed to contact his PCP. Pt verbalized  Understanding.

## 2018-11-11 ENCOUNTER — Other Ambulatory Visit: Payer: Self-pay | Admitting: Oncology

## 2018-11-14 ENCOUNTER — Inpatient Hospital Stay: Payer: Medicare Other

## 2018-11-14 ENCOUNTER — Other Ambulatory Visit: Payer: Self-pay

## 2018-11-14 ENCOUNTER — Inpatient Hospital Stay: Payer: Medicare Other | Admitting: Oncology

## 2018-11-14 VITALS — BP 129/87 | HR 88 | Temp 98.3°F | Resp 18 | Ht 72.0 in | Wt 235.9 lb

## 2018-11-14 DIAGNOSIS — C678 Malignant neoplasm of overlapping sites of bladder: Secondary | ICD-10-CM

## 2018-11-14 DIAGNOSIS — C679 Malignant neoplasm of bladder, unspecified: Secondary | ICD-10-CM

## 2018-11-14 DIAGNOSIS — Z5111 Encounter for antineoplastic chemotherapy: Secondary | ICD-10-CM | POA: Diagnosis not present

## 2018-11-14 LAB — CMP (CANCER CENTER ONLY)
ALT: 23 U/L (ref 0–44)
AST: 17 U/L (ref 15–41)
Albumin: 3.8 g/dL (ref 3.5–5.0)
Alkaline Phosphatase: 64 U/L (ref 38–126)
Anion gap: 16 — ABNORMAL HIGH (ref 5–15)
BUN: 39 mg/dL — ABNORMAL HIGH (ref 8–23)
CO2: 23 mmol/L (ref 22–32)
Calcium: 9.3 mg/dL (ref 8.9–10.3)
Chloride: 101 mmol/L (ref 98–111)
Creatinine: 2.02 mg/dL — ABNORMAL HIGH (ref 0.61–1.24)
GFR, Est AFR Am: 38 mL/min — ABNORMAL LOW (ref >60)
GFR, Estimated: 33 mL/min — ABNORMAL LOW (ref >60)
Glucose, Bld: 217 mg/dL — ABNORMAL HIGH (ref 70–99)
Potassium: 4.9 mmol/L (ref 3.5–5.1)
Sodium: 140 mmol/L (ref 135–145)
Total Bilirubin: 0.3 mg/dL (ref 0.3–1.2)
Total Protein: 6.8 g/dL (ref 6.5–8.1)

## 2018-11-14 LAB — CBC WITH DIFFERENTIAL (CANCER CENTER ONLY)
Abs Immature Granulocytes: 0.16 10*3/uL — ABNORMAL HIGH (ref 0.00–0.07)
Basophils Absolute: 0 10*3/uL (ref 0.0–0.1)
Basophils Relative: 0 %
Eosinophils Absolute: 0.2 10*3/uL (ref 0.0–0.5)
Eosinophils Relative: 3 %
HCT: 27.5 % — ABNORMAL LOW (ref 39.0–52.0)
Hemoglobin: 9.5 g/dL — ABNORMAL LOW (ref 13.0–17.0)
Immature Granulocytes: 3 %
Lymphocytes Relative: 26 %
Lymphs Abs: 1.3 10*3/uL (ref 0.7–4.0)
MCH: 31.1 pg (ref 26.0–34.0)
MCHC: 34.5 g/dL (ref 30.0–36.0)
MCV: 90.2 fL (ref 80.0–100.0)
Monocytes Absolute: 0.8 10*3/uL (ref 0.1–1.0)
Monocytes Relative: 15 %
Neutro Abs: 2.6 10*3/uL (ref 1.7–7.7)
Neutrophils Relative %: 53 %
Platelet Count: 132 10*3/uL — ABNORMAL LOW (ref 150–400)
RBC: 3.05 MIL/uL — ABNORMAL LOW (ref 4.22–5.81)
RDW: 15.5 % (ref 11.5–15.5)
WBC Count: 5.1 10*3/uL (ref 4.0–10.5)
nRBC: 0 % (ref 0.0–0.2)

## 2018-11-14 LAB — MAGNESIUM: Magnesium: 1.9 mg/dL (ref 1.7–2.4)

## 2018-11-14 NOTE — Progress Notes (Addendum)
Hematology and Oncology Follow Up Visit  Chad Yu [id number] Jun 05, 1950 68 y.o. 11/14/2018 8:06 AM Chad Yu, MDSun, Chad Hawthorne, MD   Principle Diagnosis: 68 year old man with bladder cancer diagnosed in August 2020.  He was found to have T2N0 urothelial carcinoma.  Staging work-up but did did reveal hepatic mastectomy indicating stage IV disease.   Prior Therapy:   He is status post TURBT completed in August 2020 which showed high-grade urothelial carcinoma with muscle invasion.  Current therapy:  He is currently on neoadjuvant chemotherapy of cisplatin and gemcitabine started on 10/04/2018.  He received gemcitabine 1000 mg per metered square, cisplatin 70 mg per metered square on day 1 and gemcitabine 1000 mg per metered square on day 8 out of 21-day cycle.  He completed 2 cycles of therapy and here for day 1 of cycle 3.   Interim History: Chad Yu is here for return evaluation.  Since the last visit, he continues to tolerate chemotherapy without any major complications.  He denies any nausea, vomiting or excessive fatigue or tiredness.  He denies any diarrhea or worsening neuropathy.  He is experiencing some occasional chest tightness on exertion.  He denied headaches, blurry vision, syncope or seizures.  Denies any fevers, chills or sweats.  Denied chest pain, palpitation, orthopnea or leg edema.  Denied cough, wheezing or hemoptysis.  Denied nausea, vomiting or abdominal pain.  Denies any constipation or diarrhea.  Denies any frequency urgency or hesitancy.  Denies any arthralgias or myalgias.  Denies any skin rashes or lesions.  Denies any bleeding or clotting tendency.  Denies any easy bruising.  Denies any hair or nail changes.  Denies any anxiety or depression.  Remaining review of system is negative.     Medications: Updated without any changes. Current Outpatient Medications  Medication Sig Dispense Refill  . Aspirin-Salicylamide-Caffeine (BC FAST PAIN RELIEF) 650-195-33.3 MG  PACK Take 1 packet by mouth daily as needed (pain.).    Marland Kitchen Cholecalciferol (VITAMIN D3 PO) Take 1 tablet by mouth daily.    . diclofenac sodium (VOLTAREN) 1 % GEL Apply 1 application topically at bedtime as needed (pain.).    Marland Kitchen Dulaglutide (TRULICITY) 1.5 0000000 SOPN Inject 1.5 mg into the skin every Sunday.     . empagliflozin (JARDIANCE) 25 MG TABS tablet Take 25 mg by mouth daily.    Marland Kitchen GARLIC PO Take 1 capsule by mouth daily.    . Homeopathic Products (THERAWORX GLOVE + FOAM EX) Apply 1 application topically 2 (two) times daily as needed (leg cramps).    Marland Kitchen HYDROcodone-acetaminophen (NORCO) 10-325 MG tablet Take 1-2 tablets by mouth every 4 (four) hours as needed for moderate pain. Maximum dose per 24 hours - 8 pills 20 tablet 0  . insulin glargine (LANTUS) 100 UNIT/ML injection Inject 48 Units into the skin at bedtime.     . metFORMIN (GLUCOPHAGE-XR) 500 MG 24 hr tablet Take 500 mg by mouth daily with breakfast.     . Multiple Vitamin (MULTIVITAMIN WITH MINERALS) TABS tablet Take 1 tablet by mouth daily. One-A-Day    . olmesartan (BENICAR) 40 MG tablet Take 40 mg by mouth once a week.     . phenazopyridine (PYRIDIUM) 200 MG tablet Take 1 tablet (200 mg total) by mouth 3 (three) times daily as needed for pain. 20 tablet 0  . Potassium Citrate 15 MEQ (1620 MG) TBCR Take 1 tablet by mouth 2 (two) times daily.    Marland Kitchen POTASSIUM PO Take 1 tablet by mouth daily as needed (for  leg cramps).    . prochlorperazine (COMPAZINE) 10 MG tablet TAKE 1 TABLET BY MOUTH EVERY 6 HOURS AS NEEDED FOR NAUSEA AND VOMITING 30 tablet 0  . tetrahydrozoline 0.05 % ophthalmic solution Place 1 drop into both eyes at bedtime as needed (eye irritation).     No current facility-administered medications for this visit.      Allergies:  Allergies  Allergen Reactions  . Lisinopril Other (See Comments)    Severe cough Hair ball in throat  . Statins Hives and Other (See Comments)    Other reaction(s): Cough  (ALLERGY/intolerance), Myalgias (intolerance) No cholesterol meds  . Atorvastatin Other (See Comments)    REACTION: myalgies  . Rosuvastatin Itching    Past Medical History, Surgical history, Social history, and Family History reviewed and updated.   Physical Exam: Blood pressure 129/87, pulse 88, temperature 98.3 F (36.8 C), temperature source Oral, resp. rate 18, height 6' (1.829 m), weight 235 lb 14.4 oz (107 kg), SpO2 99 %.   ECOG: 0   General appearance: Alert, awake without any distress. Head: Atraumatic without abnormalities Oropharynx: Without any thrush or ulcers. Eyes: No scleral icterus. Lymph nodes: No lymphadenopathy noted in the cervical, supraclavicular, or axillary nodes Heart:regular rate and rhythm, without any murmurs or gallops.   Lung: Clear to auscultation without any rhonchi, wheezes or dullness to percussion. Abdomin: Soft, nontender without any shifting dullness or ascites. Musculoskeletal: No clubbing or cyanosis. Neurological: No motor or sensory deficits. Skin: No rashes or lesions.     Lab Results: Lab Results  Component Value Date   WBC 5.1 11/14/2018   HGB 9.5 (L) 11/14/2018   HCT 27.5 (L) 11/14/2018   MCV 90.2 11/14/2018   PLT 132 (L) 11/14/2018     Chemistry      Component Value Date/Time   NA 138 11/01/2018 0816   K 4.1 11/01/2018 0816   CL 103 11/01/2018 0816   CO2 22 11/01/2018 0816   BUN 41 (H) 11/01/2018 0816   CREATININE 1.64 (H) 11/01/2018 0816      Component Value Date/Time   CALCIUM 8.8 (L) 11/01/2018 0816   ALKPHOS 60 11/01/2018 0816   AST 16 11/01/2018 0816   ALT 20 11/01/2018 0816   BILITOT <0.2 (L) 11/01/2018 0816      Impression and Plan:  68 year old with:  1.    High-grade urothelial carcinoma of the bladder diagnosed in August 2020.  He was found to have T2N0 disease with possible hepatic metastasis indicating stage IV disease.   Risks and benefits of continuing systemic chemotherapy at this time  was discussed.  Potential complications include myelosuppression, renal sufficiency as well as neurological deficits were reviewed.  The plan is to proceed with cycle 3 of therapy and repeat PET scan to assess his disease burden.  He might require 6 cycles of chemotherapy and assess his response afterwards.   2. IV access: No issues reported at this time with his peripheral veins.  Port-A-Cath option will be deferred.  Thank you  3. Antiemetics: No nausea or vomiting reported at this time.  4. Renal function surveillance:Creatinine clearance is around 40 cc/min.  We will continue to monitor closely and switch to carboplatin if his kidney function worsens.   5.  Neutropenia: Resolved at this time his absolute neutrophil count is adequate at this time.  5. Goals of care: Aggressive therapy is warranted at this time given his excellent performance status.  Although his disease might not be curable given his hepatic involvement.  6. Follow-up:  And will start the cycle 3 on 11/15/2018 and return in 3 weeks for the start of cycle 4.  25  minutes was spent with the patient face-to-face today.  More than 50% of time was spent on reviewing his disease status, treatment options as well as future plan of care.   Addendum: His creatinine and kidney function continues to be borderline for cisplatin chemotherapy treatment.  I have recommended changing his cisplatin to carboplatin at this time moving forward.  Will he receive AUC of 5 with dose adjusted based on his kidney function.  Zola Button, MD 10/28/20208:06 AM

## 2018-11-14 NOTE — Addendum Note (Signed)
Addended by: Wyatt Portela on: 11/14/2018 08:45 AM   Modules accepted: Orders

## 2018-11-14 NOTE — Progress Notes (Signed)
Spoke w/ Dr. Alen Blew, cisplatin is being changed to carboplatin AUC=5 starting with this treatment due to worsening renal function. Auth team has been notified of this change.   Demetrius Charity, PharmD, [facility] Pharmacist Pharmacy Phone: [phone] 11/14/2018

## 2018-11-15 ENCOUNTER — Other Ambulatory Visit: Payer: Self-pay

## 2018-11-15 ENCOUNTER — Telehealth: Payer: Self-pay | Admitting: Oncology

## 2018-11-15 ENCOUNTER — Inpatient Hospital Stay: Payer: Medicare Other

## 2018-11-15 VITALS — BP 151/82 | HR 86 | Temp 98.3°F | Resp 18

## 2018-11-15 DIAGNOSIS — C678 Malignant neoplasm of overlapping sites of bladder: Secondary | ICD-10-CM

## 2018-11-15 DIAGNOSIS — Z5111 Encounter for antineoplastic chemotherapy: Secondary | ICD-10-CM | POA: Diagnosis not present

## 2018-11-15 MED ORDER — PALONOSETRON HCL INJECTION 0.25 MG/5ML
0.2500 mg | Freq: Once | INTRAVENOUS | Status: AC
  Administered 2018-11-15: 09:00:00 0.25 mg via INTRAVENOUS

## 2018-11-15 MED ORDER — SODIUM CHLORIDE 0.9 % IV SOLN
800.0000 mg/m2 | Freq: Once | INTRAVENOUS | Status: AC
  Administered 2018-11-15: 10:00:00 1900 mg via INTRAVENOUS
  Filled 2018-11-15: qty 49.97

## 2018-11-15 MED ORDER — SODIUM CHLORIDE 0.9 % IV SOLN
Freq: Once | INTRAVENOUS | Status: AC
  Administered 2018-11-15: 09:00:00 via INTRAVENOUS
  Filled 2018-11-15: qty 250

## 2018-11-15 MED ORDER — SODIUM CHLORIDE 0.9 % IV SOLN
396.5000 mg | Freq: Once | INTRAVENOUS | Status: AC
  Administered 2018-11-15: 11:00:00 400 mg via INTRAVENOUS
  Filled 2018-11-15: qty 40

## 2018-11-15 MED ORDER — PALONOSETRON HCL INJECTION 0.25 MG/5ML
INTRAVENOUS | Status: AC
  Filled 2018-11-15: qty 5

## 2018-11-15 MED ORDER — SODIUM CHLORIDE 0.9 % IV SOLN
Freq: Once | INTRAVENOUS | Status: AC
  Administered 2018-11-15: 09:00:00 via INTRAVENOUS
  Filled 2018-11-15: qty 5

## 2018-11-15 NOTE — Patient Instructions (Signed)
[facility] Discharge Instructions for Patients Receiving Chemotherapy  Today you received the following chemotherapy agents :  Gemcitabine and Carboplatin  To help prevent nausea and vomiting after your treatment, we encourage you to take your nausea medication as prescribed.  DO DRINK LOTS OF FLUIDS AS TOLERATED.   DO NOT EAT GREASY NOR SPICY FOODS. TAKE  COMPAZINE 10 MG EVERY 6 HOURS AS NEEDED FOR NAUSEA.   THIS MEDICATION CAN CAUSE DROWSINESS -  DO NOT DRIVE AFTER TAKING THIS MEDICATION.    If you develop nausea and vomiting that is not controlled by your nausea medication, call the clinic.   BELOW ARE SYMPTOMS THAT SHOULD BE REPORTED IMMEDIATELY:  *FEVER GREATER THAN 100.5 F  *CHILLS WITH OR WITHOUT FEVER  NAUSEA AND VOMITING THAT IS NOT CONTROLLED WITH YOUR NAUSEA MEDICATION  *UNUSUAL SHORTNESS OF BREATH  *UNUSUAL BRUISING OR BLEEDING  TENDERNESS IN MOUTH AND THROAT WITH OR WITHOUT PRESENCE OF ULCERS  *URINARY PROBLEMS  *BOWEL PROBLEMS  UNUSUAL RASH Items with * indicate a potential emergency and should be followed up as soon as possible.  Feel free to call the clinic should you have any questions or concerns. The clinic phone number is [phone].  Please show the [place] at check-in to the Emergency Department and triage nurse.  Carboplatin injection What is this medicine? CARBOPLATIN (KAR boe pla tin) is a chemotherapy drug. It targets fast dividing cells, like cancer cells, and causes these cells to die. This medicine is used to treat ovarian cancer and many other cancers. This medicine may be used for other purposes; ask your health care provider or pharmacist if you have questions. COMMON BRAND NAME(S): Paraplatin What should I tell my health care provider before I take this medicine? They need to know if you have any of these conditions:  blood disorders  hearing problems  kidney disease  recent or ongoing radiation  therapy  an unusual or allergic reaction to carboplatin, cisplatin, other chemotherapy, other medicines, foods, dyes, or preservatives  pregnant or trying to get pregnant  breast-feeding How should I use this medicine? This drug is usually given as an infusion into a vein. It is administered in a hospital or clinic by a specially trained health care professional. Talk to your pediatrician regarding the use of this medicine in children. Special care may be needed. Overdosage: If you think you have taken too much of this medicine contact a poison control center or emergency room at once. NOTE: This medicine is only for you. Do not share this medicine with others. What if I miss a dose? It is important not to miss a dose. Call your doctor or health care professional if you are unable to keep an appointment. What may interact with this medicine?  medicines for seizures  medicines to increase blood counts like filgrastim, pegfilgrastim, sargramostim  some antibiotics like amikacin, gentamicin, neomycin, streptomycin, tobramycin  vaccines Talk to your doctor or health care professional before taking any of these medicines:  acetaminophen  aspirin  ibuprofen  ketoprofen  naproxen This list may not describe all possible interactions. Give your health care provider a list of all the medicines, herbs, non-prescription drugs, or dietary supplements you use. Also tell them if you smoke, drink alcohol, or use illegal drugs. Some items may interact with your medicine. What should I watch for while using this medicine? Your condition will be monitored carefully while you are receiving this medicine. You will need important blood work  done while you are taking this medicine. This drug may make you feel generally unwell. This is not uncommon, as chemotherapy can affect healthy cells as well as cancer cells. Report any side effects. Continue your course of treatment even though you feel ill unless  your doctor tells you to stop. In some cases, you may be given additional medicines to help with side effects. Follow all directions for their use. Call your doctor or health care professional for advice if you get a fever, chills or sore throat, or other symptoms of a cold or flu. Do not treat yourself. This drug decreases your body's ability to fight infections. Try to avoid being around people who are sick. This medicine may increase your risk to bruise or bleed. Call your doctor or health care professional if you notice any unusual bleeding. Be careful brushing and flossing your teeth or using a toothpick because you may get an infection or bleed more easily. If you have any dental work done, tell your dentist you are receiving this medicine. Avoid taking products that contain aspirin, acetaminophen, ibuprofen, naproxen, or ketoprofen unless instructed by your doctor. These medicines may hide a fever. Do not become pregnant while taking this medicine. Women should inform their doctor if they wish to become pregnant or think they might be pregnant. There is a potential for serious side effects to an unborn child. Talk to your health care professional or pharmacist for more information. Do not breast-feed an infant while taking this medicine. What side effects may I notice from receiving this medicine? Side effects that you should report to your doctor or health care professional as soon as possible:  allergic reactions like skin rash, itching or hives, swelling of the face, lips, or tongue  signs of infection - fever or chills, cough, sore throat, pain or difficulty passing urine  signs of decreased platelets or bleeding - bruising, pinpoint red spots on the skin, black, tarry stools, nosebleeds  signs of decreased red blood cells - unusually weak or tired, fainting spells, lightheadedness  breathing problems  changes in hearing  changes in vision  chest pain  high blood pressure  low  blood counts - This drug may decrease the number of white blood cells, red blood cells and platelets. You may be at increased risk for infections and bleeding.  nausea and vomiting  pain, swelling, redness or irritation at the injection site  pain, tingling, numbness in the hands or feet  problems with balance, talking, walking  trouble passing urine or change in the amount of urine Side effects that usually do not require medical attention (report to your doctor or health care professional if they continue or are bothersome):  hair loss  loss of appetite  metallic taste in the mouth or changes in taste This list may not describe all possible side effects. Call your doctor for medical advice about side effects. You may report side effects to FDA at 1-800-FDA-1088. Where should I keep my medicine? This drug is given in a hospital or clinic and will not be stored at home. NOTE: This sheet is a summary. It may not cover all possible information. If you have questions about this medicine, talk to your doctor, pharmacist, or health care provider.  2020 Elsevier/Gold Standard (2007-04-10 14:38:05)

## 2018-11-15 NOTE — Progress Notes (Unsigned)
Per Dr. Alen Blew, ok to treat with Scr of 2.02 from 11/14/18.   Leron Croak, PharmD PGY2 Hematology/Oncology Pharmacy Resident 11/15/2018 9:03 AM

## 2018-11-15 NOTE — Telephone Encounter (Signed)
Scheduled per los. Gave calendar  

## 2018-11-22 ENCOUNTER — Encounter (HOSPITAL_COMMUNITY): Payer: Self-pay | Admitting: Emergency Medicine

## 2018-11-22 ENCOUNTER — Other Ambulatory Visit: Payer: Self-pay

## 2018-11-22 ENCOUNTER — Other Ambulatory Visit: Payer: Self-pay | Admitting: Medical

## 2018-11-22 ENCOUNTER — Inpatient Hospital Stay (HOSPITAL_COMMUNITY)
Admission: EM | Admit: 2018-11-22 | Discharge: 2018-11-25 | DRG: 281 | Disposition: A | Discharge status: Home / Self Care | Payer: Medicare Other | Attending: Internal Medicine | Admitting: Internal Medicine

## 2018-11-22 ENCOUNTER — Emergency Department (HOSPITAL_COMMUNITY): Payer: Medicare Other

## 2018-11-22 ENCOUNTER — Inpatient Hospital Stay (HOSPITAL_BASED_OUTPATIENT_CLINIC_OR_DEPARTMENT_OTHER): Payer: Medicare Other | Admitting: Medical

## 2018-11-22 ENCOUNTER — Inpatient Hospital Stay: Payer: Medicare Other

## 2018-11-22 ENCOUNTER — Inpatient Hospital Stay: Payer: Medicare Other | Attending: Oncology

## 2018-11-22 DIAGNOSIS — M5136 Other intervertebral disc degeneration, lumbar region: Secondary | ICD-10-CM | POA: Diagnosis present

## 2018-11-22 DIAGNOSIS — Z6832 Body mass index (BMI) 32.0-32.9, adult: Secondary | ICD-10-CM

## 2018-11-22 DIAGNOSIS — C679 Malignant neoplasm of bladder, unspecified: Secondary | ICD-10-CM | POA: Insufficient documentation

## 2018-11-22 DIAGNOSIS — I208 Other forms of angina pectoris: Secondary | ICD-10-CM | POA: Diagnosis not present

## 2018-11-22 DIAGNOSIS — R079 Chest pain, unspecified: Secondary | ICD-10-CM | POA: Diagnosis not present

## 2018-11-22 DIAGNOSIS — I129 Hypertensive chronic kidney disease with stage 1 through stage 4 chronic kidney disease, or unspecified chronic kidney disease: Secondary | ICD-10-CM | POA: Diagnosis present

## 2018-11-22 DIAGNOSIS — I2510 Atherosclerotic heart disease of native coronary artery without angina pectoris: Secondary | ICD-10-CM | POA: Diagnosis not present

## 2018-11-22 DIAGNOSIS — Z955 Presence of coronary angioplasty implant and graft: Secondary | ICD-10-CM

## 2018-11-22 DIAGNOSIS — D649 Anemia, unspecified: Secondary | ICD-10-CM | POA: Diagnosis not present

## 2018-11-22 DIAGNOSIS — Z5111 Encounter for antineoplastic chemotherapy: Secondary | ICD-10-CM | POA: Insufficient documentation

## 2018-11-22 DIAGNOSIS — I119 Hypertensive heart disease without heart failure: Secondary | ICD-10-CM | POA: Insufficient documentation

## 2018-11-22 DIAGNOSIS — Z20828 Contact with and (suspected) exposure to other viral communicable diseases: Secondary | ICD-10-CM | POA: Diagnosis present

## 2018-11-22 DIAGNOSIS — I25118 Atherosclerotic heart disease of native coronary artery with other forms of angina pectoris: Secondary | ICD-10-CM

## 2018-11-22 DIAGNOSIS — Z981 Arthrodesis status: Secondary | ICD-10-CM

## 2018-11-22 DIAGNOSIS — I25119 Atherosclerotic heart disease of native coronary artery with unspecified angina pectoris: Secondary | ICD-10-CM | POA: Insufficient documentation

## 2018-11-22 DIAGNOSIS — Z794 Long term (current) use of insulin: Secondary | ICD-10-CM

## 2018-11-22 DIAGNOSIS — I200 Unstable angina: Secondary | ICD-10-CM

## 2018-11-22 DIAGNOSIS — E7800 Pure hypercholesterolemia, unspecified: Secondary | ICD-10-CM | POA: Diagnosis present

## 2018-11-22 DIAGNOSIS — N1831 Chronic kidney disease, stage 3a: Secondary | ICD-10-CM | POA: Diagnosis present

## 2018-11-22 DIAGNOSIS — K769 Liver disease, unspecified: Secondary | ICD-10-CM | POA: Insufficient documentation

## 2018-11-22 DIAGNOSIS — T451X5A Adverse effect of antineoplastic and immunosuppressive drugs, initial encounter: Secondary | ICD-10-CM | POA: Diagnosis present

## 2018-11-22 DIAGNOSIS — I10 Essential (primary) hypertension: Secondary | ICD-10-CM | POA: Diagnosis present

## 2018-11-22 DIAGNOSIS — E1122 Type 2 diabetes mellitus with diabetic chronic kidney disease: Secondary | ICD-10-CM | POA: Diagnosis present

## 2018-11-22 DIAGNOSIS — E785 Hyperlipidemia, unspecified: Secondary | ICD-10-CM | POA: Diagnosis present

## 2018-11-22 DIAGNOSIS — I25110 Atherosclerotic heart disease of native coronary artery with unstable angina pectoris: Secondary | ICD-10-CM | POA: Diagnosis present

## 2018-11-22 DIAGNOSIS — R0789 Other chest pain: Secondary | ICD-10-CM

## 2018-11-22 DIAGNOSIS — E669 Obesity, unspecified: Secondary | ICD-10-CM | POA: Diagnosis present

## 2018-11-22 DIAGNOSIS — I214 Non-ST elevation (NSTEMI) myocardial infarction: Principal | ICD-10-CM | POA: Diagnosis present

## 2018-11-22 DIAGNOSIS — Z96653 Presence of artificial knee joint, bilateral: Secondary | ICD-10-CM | POA: Diagnosis present

## 2018-11-22 DIAGNOSIS — D72819 Decreased white blood cell count, unspecified: Secondary | ICD-10-CM | POA: Diagnosis present

## 2018-11-22 DIAGNOSIS — K219 Gastro-esophageal reflux disease without esophagitis: Secondary | ICD-10-CM | POA: Diagnosis present

## 2018-11-22 DIAGNOSIS — E1169 Type 2 diabetes mellitus with other specified complication: Secondary | ICD-10-CM

## 2018-11-22 DIAGNOSIS — R778 Other specified abnormalities of plasma proteins: Secondary | ICD-10-CM

## 2018-11-22 DIAGNOSIS — C787 Secondary malignant neoplasm of liver and intrahepatic bile duct: Secondary | ICD-10-CM | POA: Diagnosis present

## 2018-11-22 DIAGNOSIS — Z8249 Family history of ischemic heart disease and other diseases of the circulatory system: Secondary | ICD-10-CM

## 2018-11-22 DIAGNOSIS — Z888 Allergy status to other drugs, medicaments and biological substances status: Secondary | ICD-10-CM

## 2018-11-22 DIAGNOSIS — D631 Anemia in chronic kidney disease: Secondary | ICD-10-CM | POA: Diagnosis present

## 2018-11-22 LAB — CBC WITH DIFFERENTIAL (CANCER CENTER ONLY)
Abs Immature Granulocytes: 0.01 10*3/uL (ref 0.00–0.07)
Basophils Absolute: 0 10*3/uL (ref 0.0–0.1)
Basophils Relative: 1 %
Eosinophils Absolute: 0.1 10*3/uL (ref 0.0–0.5)
Eosinophils Relative: 3 %
HCT: 23.2 % — ABNORMAL LOW (ref 39.0–52.0)
Hemoglobin: 8.1 g/dL — ABNORMAL LOW (ref 13.0–17.0)
Immature Granulocytes: 1 %
Lymphocytes Relative: 44 %
Lymphs Abs: 0.9 10*3/uL (ref 0.7–4.0)
MCH: 31.5 pg (ref 26.0–34.0)
MCHC: 34.9 g/dL (ref 30.0–36.0)
MCV: 90.3 fL (ref 80.0–100.0)
Monocytes Absolute: 0.1 10*3/uL (ref 0.1–1.0)
Monocytes Relative: 6 %
Neutro Abs: 0.9 10*3/uL — ABNORMAL LOW (ref 1.7–7.7)
Neutrophils Relative %: 45 %
Platelet Count: 179 10*3/uL (ref 150–400)
RBC: 2.57 MIL/uL — ABNORMAL LOW (ref 4.22–5.81)
RDW: 16 % — ABNORMAL HIGH (ref 11.5–15.5)
WBC Count: 1.9 10*3/uL — ABNORMAL LOW (ref 4.0–10.5)
nRBC: 0 % (ref 0.0–0.2)

## 2018-11-22 LAB — CBC
HCT: 24.2 % — ABNORMAL LOW (ref 39.0–52.0)
HCT: 22.9 % — ABNORMAL LOW (ref 39.0–52.0)
Hemoglobin: 8.1 g/dL — ABNORMAL LOW (ref 13.0–17.0)
Hemoglobin: 8 g/dL — ABNORMAL LOW (ref 13.0–17.0)
MCH: 31.3 pg (ref 26.0–34.0)
MCH: 31.7 pg (ref 26.0–34.0)
MCHC: 33.5 g/dL (ref 30.0–36.0)
MCHC: 34.9 g/dL (ref 30.0–36.0)
MCV: 93.4 fL (ref 80.0–100.0)
MCV: 90.9 fL (ref 80.0–100.0)
Platelets: 180 10*3/uL (ref 150–400)
Platelets: 154 10*3/uL (ref 150–400)
RBC: 2.59 MIL/uL — ABNORMAL LOW (ref 4.22–5.81)
RBC: 2.52 MIL/uL — ABNORMAL LOW (ref 4.22–5.81)
RDW: 16 % — ABNORMAL HIGH (ref 11.5–15.5)
RDW: 15.9 % — ABNORMAL HIGH (ref 11.5–15.5)
WBC: 2.1 10*3/uL — ABNORMAL LOW (ref 4.0–10.5)
WBC: 2.1 10*3/uL — ABNORMAL LOW (ref 4.0–10.5)
nRBC: 0 % (ref 0.0–0.2)
nRBC: 0 % (ref 0.0–0.2)

## 2018-11-22 LAB — TROPONIN I (HIGH SENSITIVITY)
Troponin I (High Sensitivity): 75 ng/L — ABNORMAL HIGH (ref <18)
Troponin I (High Sensitivity): 80 ng/L — ABNORMAL HIGH (ref <18)
Troponin I (High Sensitivity): 73 ng/L — ABNORMAL HIGH (ref <18)
Troponin I (High Sensitivity): 74 ng/L — ABNORMAL HIGH (ref <18)

## 2018-11-22 LAB — CMP (CANCER CENTER ONLY)
ALT: 29 U/L (ref 0–44)
AST: 21 U/L (ref 15–41)
Albumin: 3.9 g/dL (ref 3.5–5.0)
Alkaline Phosphatase: 62 U/L (ref 38–126)
Anion gap: 14 (ref 5–15)
BUN: 37 mg/dL — ABNORMAL HIGH (ref 8–23)
CO2: 21 mmol/L — ABNORMAL LOW (ref 22–32)
Calcium: 8.9 mg/dL (ref 8.9–10.3)
Chloride: 103 mmol/L (ref 98–111)
Creatinine: 1.98 mg/dL — ABNORMAL HIGH (ref 0.61–1.24)
GFR, Est AFR Am: 39 mL/min — ABNORMAL LOW (ref >60)
GFR, Estimated: 34 mL/min — ABNORMAL LOW (ref >60)
Glucose, Bld: 196 mg/dL — ABNORMAL HIGH (ref 70–99)
Potassium: 4.7 mmol/L (ref 3.5–5.1)
Sodium: 138 mmol/L (ref 135–145)
Total Bilirubin: 0.5 mg/dL (ref 0.3–1.2)
Total Protein: 7 g/dL (ref 6.5–8.1)

## 2018-11-22 LAB — BASIC METABOLIC PANEL
Anion gap: 11 (ref 5–15)
BUN: 36 mg/dL — ABNORMAL HIGH (ref 8–23)
CO2: 22 mmol/L (ref 22–32)
Calcium: 9 mg/dL (ref 8.9–10.3)
Chloride: 103 mmol/L (ref 98–111)
Creatinine, Ser: 1.87 mg/dL — ABNORMAL HIGH (ref 0.61–1.24)
GFR calc Af Amer: 42 mL/min — ABNORMAL LOW (ref >60)
GFR calc non Af Amer: 36 mL/min — ABNORMAL LOW (ref >60)
Glucose, Bld: 182 mg/dL — ABNORMAL HIGH (ref 70–99)
Potassium: 4.9 mmol/L (ref 3.5–5.1)
Sodium: 136 mmol/L (ref 135–145)

## 2018-11-22 LAB — BRAIN NATRIURETIC PEPTIDE: B Natriuretic Peptide: 54.2 pg/mL (ref 0.0–100.0)

## 2018-11-22 LAB — SARS CORONAVIRUS 2 (TAT 6-24 HRS): SARS Coronavirus 2: NEGATIVE

## 2018-11-22 LAB — CREATININE, SERUM
Creatinine, Ser: 1.71 mg/dL — ABNORMAL HIGH (ref 0.61–1.24)
GFR calc Af Amer: 47 mL/min — ABNORMAL LOW (ref >60)
GFR calc non Af Amer: 40 mL/min — ABNORMAL LOW (ref >60)

## 2018-11-22 LAB — GLUCOSE, CAPILLARY: Glucose-Capillary: 85 mg/dL (ref 70–99)

## 2018-11-22 MED ORDER — CLOPIDOGREL BISULFATE 75 MG PO TABS
75.0000 mg | ORAL_TABLET | Freq: Every day | ORAL | Status: DC
  Administered 2018-11-23 – 2018-11-25 (×3): 75 mg via ORAL
  Filled 2018-11-22 (×4): qty 1

## 2018-11-22 MED ORDER — INSULIN LISPRO (1 UNIT DIAL) 100 UNIT/ML (KWIKPEN)
15.0000 [IU] | PEN_INJECTOR | Freq: Two times a day (BID) | SUBCUTANEOUS | Status: DC
  Filled 2018-11-22: qty 3

## 2018-11-22 MED ORDER — ISOSORBIDE MONONITRATE ER 30 MG PO TB24
30.0000 mg | ORAL_TABLET | Freq: Every day | ORAL | Status: DC
  Administered 2018-11-22 – 2018-11-25 (×4): 30 mg via ORAL
  Filled 2018-11-22 (×4): qty 1

## 2018-11-22 MED ORDER — METOPROLOL TARTRATE 25 MG PO TABS
25.0000 mg | ORAL_TABLET | Freq: Two times a day (BID) | ORAL | Status: DC
  Administered 2018-11-22 – 2018-11-25 (×6): 25 mg via ORAL
  Filled 2018-11-22 (×6): qty 1

## 2018-11-22 MED ORDER — IRBESARTAN 300 MG PO TABS: 300.0000 mg | ORAL_TABLET | Freq: Every day | ORAL | Status: DC

## 2018-11-22 MED ORDER — ASPIRIN EC 81 MG PO TBEC
81.0000 mg | DELAYED_RELEASE_TABLET | Freq: Every day | ORAL | Status: DC
  Administered 2018-11-23 – 2018-11-25 (×3): 81 mg via ORAL
  Filled 2018-11-22 (×4): qty 1

## 2018-11-22 MED ORDER — AMLODIPINE BESYLATE 5 MG PO TABS
5.0000 mg | ORAL_TABLET | Freq: Every day | ORAL | Status: DC
  Administered 2018-11-22 – 2018-11-25 (×4): 5 mg via ORAL
  Filled 2018-11-22 (×4): qty 1

## 2018-11-22 MED ORDER — INSULIN ASPART 100 UNIT/ML ~~LOC~~ SOLN
0.0000 [IU] | Freq: Three times a day (TID) | SUBCUTANEOUS | Status: DC
  Administered 2018-11-23 (×3): 2 [IU] via SUBCUTANEOUS
  Administered 2018-11-24 (×2): 5 [IU] via SUBCUTANEOUS
  Administered 2018-11-25: 13:00:00 3 [IU] via SUBCUTANEOUS

## 2018-11-22 MED ORDER — SODIUM CHLORIDE 0.9% FLUSH
3.0000 mL | Freq: Once | INTRAVENOUS | Status: AC
  Administered 2018-11-22: 18:00:00 3 mL via INTRAVENOUS

## 2018-11-22 MED ORDER — INSULIN ASPART 100 UNIT/ML ~~LOC~~ SOLN
0.0000 [IU] | Freq: Every day | SUBCUTANEOUS | Status: DC
  Administered 2018-11-24: 22:00:00 2 [IU] via SUBCUTANEOUS

## 2018-11-22 MED ORDER — ENOXAPARIN SODIUM 40 MG/0.4ML ~~LOC~~ SOLN
40.0000 mg | SUBCUTANEOUS | Status: DC
  Administered 2018-11-22: 18:00:00 40 mg via SUBCUTANEOUS
  Filled 2018-11-22: qty 0.4

## 2018-11-22 MED ORDER — ACETAMINOPHEN 325 MG PO TABS
650.0000 mg | ORAL_TABLET | ORAL | Status: DC | PRN
  Administered 2018-11-22 – 2018-11-25 (×4): 650 mg via ORAL
  Filled 2018-11-22 (×4): qty 2

## 2018-11-22 MED ORDER — HEPARIN BOLUS VIA INFUSION
2500.0000 [IU] | Freq: Once | INTRAVENOUS | Status: AC
  Administered 2018-11-22: 19:00:00 2500 [IU] via INTRAVENOUS
  Filled 2018-11-22: qty 2500

## 2018-11-22 MED ORDER — NAPHAZOLINE-GLYCERIN 0.012-0.2 % OP SOLN
1.0000 [drp] | Freq: Four times a day (QID) | OPHTHALMIC | Status: DC | PRN
  Filled 2018-11-22: qty 15

## 2018-11-22 MED ORDER — PROCHLORPERAZINE MALEATE 5 MG PO TABS
10.0000 mg | ORAL_TABLET | Freq: Four times a day (QID) | ORAL | Status: DC | PRN
  Filled 2018-11-22: qty 2

## 2018-11-22 MED ORDER — HEPARIN (PORCINE) 25000 UT/250ML-% IV SOLN
1200.0000 [IU]/h | INTRAVENOUS | Status: AC
  Administered 2018-11-22 – 2018-11-24 (×3): 1200 [IU]/h via INTRAVENOUS
  Filled 2018-11-22 (×3): qty 250

## 2018-11-22 MED ORDER — INSULIN GLARGINE 100 UNIT/ML ~~LOC~~ SOLN
50.0000 [IU] | Freq: Every day | SUBCUTANEOUS | Status: DC
  Administered 2018-11-22 – 2018-11-24 (×3): 50 [IU] via SUBCUTANEOUS
  Filled 2018-11-22 (×4): qty 0.5

## 2018-11-22 MED ORDER — INSULIN ASPART 100 UNIT/ML ~~LOC~~ SOLN
15.0000 [IU] | Freq: Two times a day (BID) | SUBCUTANEOUS | Status: DC
  Administered 2018-11-23 – 2018-11-25 (×5): 15 [IU] via SUBCUTANEOUS

## 2018-11-22 MED ORDER — CLOPIDOGREL BISULFATE 75 MG PO TABS
600.0000 mg | ORAL_TABLET | Freq: Every day | ORAL | Status: DC
  Administered 2018-11-22: 600 mg via ORAL
  Filled 2018-11-22: qty 8

## 2018-11-22 NOTE — Progress Notes (Signed)
ANTICOAGULATION CONSULT NOTE - Initial Consult  Pharmacy Consult for Heparin Indication: ACS/STEMI  Allergies  Allergen Reactions  . Lisinopril Other (See Comments)    Severe cough Hair ball in throat  . Statins Hives and Other (See Comments)    Other reaction(s): Cough (ALLERGY/intolerance), Myalgias (intolerance) No cholesterol meds  . Atorvastatin Other (See Comments)    REACTION: myalgies  . Rosuvastatin Itching    Patient Measurements: Height: 6' (182.9 cm) Weight: 223 lb 12.8 oz (101.5 kg) IBW/kg (Calculated) : 77.6 Heparin Dosing Weight: 98.4 kg  Vital Signs: Temp: 98.7 F (37.1 C) (11/05 1705) Temp Source: Oral (11/05 1705) BP: 160/80 (11/05 1705) Pulse Rate: 67 (11/05 1705)  Labs: Recent Labs    11/22/18 0845 11/22/18 1126 11/22/18 1440 11/22/18 1724  HGB 8.1* 8.1*  --  8.0*  HCT 23.2* 24.2*  --  22.9*  PLT 179 180  --  154  CREATININE 1.98* 1.87*  --   --   TROPONINIHS  --  75* 80*  --     Estimated Creatinine Clearance: 46.6 mL/min (A) (by C-G formula based on SCr of 1.87 mg/dL (H)).   Assessment: 68 yr old male with medical history below presented to ED with chest pain and new ECG changes; initial troponin was 75, second troponin was 80.   Medical history includes: CAD (S/P stent), HLD, HTN, DM2, metastatic urothelial carcinoma with hepatic metastases (currently undergoing chemotherapy), probable CKD stage III, anemia of chronic disease.  H/H 8/22.9, platelets 154; he is on no anticoagulants PTA; cardiac cath planned for tomorrow  Pt rec'd a dose of Lovenox 40 mg SQ X 1 at 17:37 this afternoon  Goal of Therapy:  Heparin level 0.3-0.7 units/ml Monitor platelets by anticoagulation protocol: Yes   Plan:  Since pt rec'd a dose of Lovenox 40 mg SQ X 1 at 17:37 PM this afternoon, will give a reduced bolus of heparin (2500 units IV X 1), then start heparin infusion at 1200 units/hr Check 6-hr heparin level Monitor daily heparin level, CBC Monitor  for signs/symptoms of bleeding  Gillermina Hu, PharmD, BCPS, [facility] Clinical Pharmacist 11/22/2018,5:57 PM

## 2018-11-22 NOTE — ED Provider Notes (Signed)
[facility] EMERGENCY DEPARTMENT Provider Note   CSN: [id number] Arrival date & time: 11/22/18  1114     History   Chief Complaint Chief Complaint  Patient presents with  . Chest Pain    HPI Chad Yu is a 68 y.o. male.  The history is provided by the patient.  Chest Pain Pain location:  Substernal area Pain quality: dull and pressure   Pain quality: not radiating   Pain radiates to:  Does not radiate Pain severity:  No pain (3-4/10 when pain is present) Onset quality:  Gradual Duration: 45. Timing: Constant when exerting himself. Chronicity:  Recurrent Context: movement   Relieved by:  Certain positions and leaning forward Worsened by:  Movement and exertion Ineffective treatments:  Rest Associated symptoms: no abdominal pain, no back pain, no cough, no diaphoresis, no dizziness, no fever, no nausea, no palpitations, no syncope and no vomiting   Risk factors: coronary artery disease (1 stent placed years ago), diabetes mellitus, high cholesterol, hypertension and male sex   Risk factors: no prior DVT/PE and no smoking    Patient has had bladder cancer, and he is on his second month of chemotherapy. Chest pain has been worse since starting chemotherapy. Patient has no pain currently, but he has pain when exerting himself. The patient is currently treated with carboplatin and gemcitabine. He went to his oncology clinic today, and he explained his symptoms.  At that time, he had new EKG changes, so they sent him to be further evaluated in our ED. EMS gave 324 mg of aspirin, but no nitroglycerin was necessary as patient did not have chest pain on their evaluation.  Past Medical History:  Diagnosis Date  . Bladder tumor   . Coronary artery disease cardiologist-- dr [name] (per pt last office visit 10/ 2018)   s/p  PCI w/ DES x1 to diagonal branch of LAD  . DDD (degenerative disc disease), lumbar   . Degenerative joint disease   . History of  concussion    per pt age 8 w/ brief loc--- no residual  . Hyperlipidemia   . Hypertension   . OA (osteoarthritis)   . S/P drug eluting coronary stent placement 10/24/2007   diagonal branch of LAD  . Type 2 diabetes mellitus treated with insulin ([place])    followed by pcp  . Varicose vein of leg   . Wears glasses     Patient Active Problem List   Diagnosis Date Noted  . Goals of care, counseling/discussion 09/19/2018  . Bladder cancer ([place]) 09/19/2018  . Primary localized osteoarthritis of left knee 02/17/2016  . Bradycardia 12/16/2010  . DIZZINESS 04/27/2009  . CHEST PAIN 04/27/2009  . ABNORMAL ELECTROCARDIOGRAM 04/27/2009  . ABDOMINAL PAIN-RUQ 10/30/2008  . GASTRITIS 06/13/2008  . GERD 06/05/2008  . SWELLING MASS OR LUMP IN HEAD AND NECK 06/05/2008  . COUGH 06/05/2008  . HEMORRHOIDS 06/04/2008  . CAD, NATIVE VESSEL 04/09/2008  . DM 04/07/2008  . HYPERLIPIDEMIA-MIXED 04/07/2008  . Essential hypertension 04/07/2008  . FATIGUE / MALAISE 04/07/2008  . DYSPNEA 04/07/2008    Past Surgical History:  Procedure Laterality Date  . APPENDECTOMY  age 10  . COLONOSCOPY  2012  . CORONARY ANGIOPLASTY  08-26-2010   dr [name]   previous stent patent,  proximal prior to previous stent 99% stenosis;  Unsuccessful multiple attempts PCI, unability to cross with a ballon;  medical management  . CORONARY ANGIOPLASTY WITH STENT PLACEMENT  10-23-2017  dr Darnell Level brodie   positive  ischemia on myoview;   40% pLAD and D1 80%,  diagonal branch treated w/ PCI and DES   . EXCISION MIDLINE SUPERIOR THYOID ISTHMUS MASS  11-27-2008   dr Ernesto Rutherford   bening lipoma  . KNEE ARTHROSCOPY Left 01-22-2004   dr Percell Miller  . POSTERIOR LUMBAR FUSION  11-14-2012   dr Christella Noa @[facility]    L3 -- 5  . SHOULDER ARTHROSCOPY Right 2019  . SHOULDER ARTHROSCOPY WITH ROTATOR CUFF REPAIR AND SUBACROMIAL DECOMPRESSION Left 11-12-2009   dr Percell Miller  @[facility]    w/ Labral debridement and DCR  . TONSILLECTOMY  age 45  . TOTAL KNEE  ARTHROPLASTY Left 02/17/2016   Procedure: TOTAL KNEE ARTHROPLASTY;  Surgeon: Ninetta Lights, MD;  Location: [facility];  Service: Orthopedics;  Laterality: Left;  . TOTAL KNEE ARTHROPLASTY Right 02-25-2004   dr Percell Miller  @[facility]   . TRANSURETHRAL RESECTION OF BLADDER TUMOR N/A 08/31/2018   Procedure: TRANSURETHRAL RESECTION OF BLADDER TUMOR (TURBT) WITH INSTILLATION OF POST OPERATIVE CHEMOTHERAPY;  Surgeon: Kathie Rhodes, MD;  Location: [facility];  Service: Urology;  Laterality: N/A;  . [place] Medications    Prior to Admission medications   Medication Sig Start Date End Date Taking? Authorizing Provider  Aspirin-Salicylamide-Caffeine (BC FAST PAIN RELIEF) 650-195-33.3 MG PACK Take 1 packet by mouth daily as needed (pain.).    [provider]  Cholecalciferol (VITAMIN D3 PO) Take 1 tablet by mouth daily.    [provider]  diclofenac sodium (VOLTAREN) 1 % GEL Apply 1 application topically at bedtime as needed (pain.).    [provider]  Dulaglutide (TRULICITY) 1.5 0000000 SOPN Inject 1.5 mg into the skin every Sunday.     [provider]  empagliflozin (JARDIANCE) 25 MG TABS tablet Take 25 mg by mouth daily.    [provider]  GARLIC PO Take 1 capsule by mouth daily.    [provider]  Homeopathic Products (THERAWORX GLOVE + FOAM EX) Apply 1 application topically 2 (two) times daily as needed (leg cramps).    [provider]  HYDROcodone-acetaminophen (NORCO) 10-325 MG tablet Take 1-2 tablets by mouth every 4 (four) hours as needed for moderate pain. Maximum dose per 24 hours - 8 pills 08/31/18   Kathie Rhodes, MD  insulin glargine (LANTUS) 100 UNIT/ML injection Inject 48 Units into the skin at bedtime.     [provider]  metFORMIN (GLUCOPHAGE-XR) 500 MG 24 hr tablet Take 500 mg by mouth daily with breakfast.     [provider]  Multiple Vitamin (MULTIVITAMIN WITH MINERALS) TABS tablet  Take 1 tablet by mouth daily. One-A-Day    [provider]  olmesartan (BENICAR) 40 MG tablet Take 40 mg by mouth once a week.     [provider]  phenazopyridine (PYRIDIUM) 200 MG tablet Take 1 tablet (200 mg total) by mouth 3 (three) times daily as needed for pain. 08/31/18   Kathie Rhodes, MD  Potassium Citrate 15 MEQ (1620 MG) TBCR Take 1 tablet by mouth 2 (two) times daily.    [provider]  POTASSIUM PO Take 1 tablet by mouth daily as needed (for leg cramps).    [provider]  prochlorperazine (COMPAZINE) 10 MG tablet TAKE 1 TABLET BY MOUTH EVERY 6 HOURS AS NEEDED FOR NAUSEA AND VOMITING 11/12/18   Wyatt Portela, MD  tetrahydrozoline 0.05 % ophthalmic solution Place 1 drop into both eyes at bedtime as needed (eye irritation).  [provider]  fenofibrate (TRICOR) 145 MG tablet Take 145 mg by mouth daily.    08/02/18  [provider]    Family History Family History  Problem Relation Age of Onset  . Heart attack Father   . Hypertension Father   . Heart attack Brother     Social History Social History   Tobacco Use  . Smoking status: Never Smoker  . Smokeless tobacco: Never Used  Substance Use Topics  . Alcohol use: No  . Drug use: No     Allergies   Lisinopril, Statins, Atorvastatin, and Rosuvastatin   Review of Systems Review of Systems  Constitutional: Negative for diaphoresis and fever.  Respiratory: Negative for cough.   Cardiovascular: Positive for chest pain. Negative for palpitations and syncope.  Gastrointestinal: Negative for abdominal pain, nausea and vomiting.  Musculoskeletal: Negative for back pain.  Neurological: Negative for dizziness.  All other systems reviewed and are negative.    Physical Exam Updated Vital Signs BP (!) 146/78 (BP Location: Right Arm)   Pulse 72   Temp 98.4 F (36.9 C) (Oral)   Resp 16   Ht 6' (1.829 m)   Wt 108 kg   SpO2 100%   BMI 32.28 kg/m   Physical  Exam Vitals signs and nursing note reviewed.  Constitutional:      Appearance: He is well-developed. He is obese. He is not ill-appearing or diaphoretic.  HENT:     Head: Normocephalic and atraumatic.  Eyes:     Conjunctiva/sclera: Conjunctivae normal.  Neck:     Musculoskeletal: Neck supple.  Cardiovascular:     Rate and Rhythm: Normal rate and regular rhythm.     Pulses:          Radial pulses are 2+ on the right side and 2+ on the left side.     Heart sounds: No murmur.  Pulmonary:     Effort: Pulmonary effort is normal. No accessory muscle usage or respiratory distress.     Breath sounds: Normal breath sounds.     Comments: When ambulated, patient did not drop his oxygen saturations.  Currently satting well on room air Chest:     Comments: No chest wall tenderness to palpation Abdominal:     Palpations: Abdomen is soft.     Tenderness: There is no abdominal tenderness.  Musculoskeletal:     Comments: No pitting edema in bilateral lower extremities, no asymmetry, no erythema, no posterior calf tenderness to palpation  Skin:    General: Skin is warm and dry.  Neurological:     General: No focal deficit present.     Mental Status: He is alert and oriented to person, place, and time.     Comments: Normal gait      ED Treatments / Results  Labs (all labs ordered are listed, but only abnormal results are displayed) Labs Reviewed  BASIC METABOLIC PANEL - Abnormal; Notable for the following components:      Result Value   Glucose, Bld 182 (*)    BUN 36 (*)    Creatinine, Ser 1.87 (*)    GFR calc non Af Amer 36 (*)    GFR calc Af Amer 42 (*)    All other components within normal limits  CBC - Abnormal; Notable for the following components:   WBC 2.1 (*)    RBC 2.59 (*)    Hemoglobin 8.1 (*)    HCT 24.2 (*)    RDW 16.0 (*)  All other components within normal limits  TROPONIN I (HIGH SENSITIVITY) - Abnormal; Notable for the following components:   Troponin I (High  Sensitivity) 75 (*)    All other components within normal limits  TROPONIN I (HIGH SENSITIVITY) - Abnormal; Notable for the following components:   Troponin I (High Sensitivity) 80 (*)    All other components within normal limits  BRAIN NATRIURETIC PEPTIDE  TROPONIN I (HIGH SENSITIVITY)  TROPONIN I (HIGH SENSITIVITY)    EKG EKG Interpretation  Date/Time:  Thursday November 22 2018 11:18:37 EST Ventricular Rate:  79 PR Interval:  196 QRS Duration: 80 QT Interval:  364 QTC Calculation: 417 R Axis:   45 Text Interpretation: Normal sinus rhythm Nonspecific ST and T wave abnormality `mild st depression II  and v2-v6, changed from prior Confirmed by Lajean Saver [phone]) on 11/22/2018 3:01:35 PM   Radiology Dg Chest 2 View  Result Date: 11/22/2018 CLINICAL DATA:  68 year old male with a history of chest pressure EXAM: CHEST - 2 VIEW COMPARISON:  11/08/2012 FINDINGS: Cardiomediastinal silhouette within normal limits in size and contour. Low lung volumes with coarsened interstitial markings similar to the prior. No pneumothorax or pleural effusion. No confluent airspace disease. Degenerative changes of the spine with no displaced fracture. IMPRESSION: Negative for acute cardiopulmonary disease Electronically Signed   By: Corrie Mckusick D.O.   On: 11/22/2018 11:40    Procedures Procedures (including critical care time)  Medications Ordered in ED Medications  sodium chloride flush (NS) 0.9 % injection 3 mL (has no administration in time range)     Initial Impression / Assessment and Plan / ED Course  I have reviewed the triage vital signs and the nursing notes.  Pertinent labs & imaging results that were available during my care of the patient were reviewed by me and considered in my medical decision making (see chart for details).     HEAR Score: 6  Chad Yu is a 68 y.o. male with a past medical history of bladder cancer currently on chemotherapy for the last few months,  carboplatin and gemcitabine, presenting today for exertional chest pain that has been present since starting chemotherapy.  Labs and imaging ordered for differential diagnosis of ACS, electrolyte abnormality, sepsis.  HEAR score 6.  EKG was reviewed and showed new changes to previous EKGs.  Initial troponin is elevated at 75.  Chest x-ray showed no acute abnormality.  Plan is to admit to medicine for further evaluation and management for elevated troponin and unstable angina.  Cardiology consulted in the ED.  Second troponin resulted at 80.  Will not start heparin, and will defer to admitting team at this time.  Care of patient discussed with the supervising attending.  Final Clinical Impressions(s) / ED Diagnoses   Final diagnoses:  Unstable angina ([place])  Elevated troponin    ED Discharge Orders    None       Julianne Rice, MD 11/22/18 1627    Lajean Saver, MD 11/22/18 2313

## 2018-11-22 NOTE — ED Notes (Signed)
ED TO INPATIENT HANDOFF REPORT  ED Nurse Name and Phone #:   S Name/Age/Gender Chad Yu 68 y.o. male Room/Bed: 022C/022C  Code Status   Code Status: Prior  Home/SNF/Other Home Patient oriented to: self, place, time and situation Is this baseline? Yes   Triage Complete: Triage complete  Chief Complaint cp  Triage Note Pt arrives via EMS from [facility] for bladder cancer- complaining of CP with exersion. Heaviness- worsens with ambulation. Cancer PA noticed nonspecific changes on EKG. Denies pain at rest. EMS gave 324mg  asa. Bp 156/70, HR 72, 18 resp, 98%.    Allergies Allergies  Allergen Reactions  . Lisinopril Other (See Comments)    Severe cough Hair ball in throat  . Statins Hives and Other (See Comments)    Other reaction(s): Cough (ALLERGY/intolerance), Myalgias (intolerance) No cholesterol meds  . Atorvastatin Other (See Comments)    REACTION: myalgies  . Rosuvastatin Itching    Level of Care/Admitting Diagnosis ED Disposition    ED Disposition Condition [facility] Area: [place] [[id number]]  Level of Care: Telemetry Medical [104]  I expect the patient will be discharged within 24 hours: Yes  LOW acuity---Tx typically complete <24 hrs---ACUTE conditions typically can be evaluated <24 hours---LABS likely to return to acceptable levels <24 hours---IS near functional baseline---EXPECTED to return to current living arrangement---NOT newly hypoxic: Meets criteria for 5C-Observation unit  Covid Evaluation: N/A  Diagnosis: Chest pain [id number]  Admitting Physician: Aline August [id number]  Attending Physician: Aline August [id number]  PT Class (Do Not Modify): Observation [104]  PT Acc Code (Do Not Modify): Observation [10022]       B Medical/Surgery History Past Medical History:  Diagnosis Date  . Bladder tumor   . Coronary artery disease cardiologist-- dr [name] (per pt last office visit 10/ 2018)   s/p   PCI w/ DES x1 to diagonal branch of LAD  . DDD (degenerative disc disease), lumbar   . Degenerative joint disease   . History of concussion    per pt age 58 w/ brief loc--- no residual  . Hyperlipidemia   . Hypertension   . OA (osteoarthritis)   . S/P drug eluting coronary stent placement 10/24/2007   diagonal branch of LAD  . Type 2 diabetes mellitus treated with insulin ([place])    followed by pcp  . Varicose vein of leg   . Wears glasses    Past Surgical History:  Procedure Laterality Date  . APPENDECTOMY  age 73  . COLONOSCOPY  2012  . CORONARY ANGIOPLASTY  08-26-2010   dr [name]   previous stent patent,  proximal prior to previous stent 99% stenosis;  Unsuccessful multiple attempts PCI, unability to cross with a ballon;  medical management  . CORONARY ANGIOPLASTY WITH STENT PLACEMENT  10-23-2017  dr Darnell Level brodie   positive ischemia on myoview;   40% pLAD and D1 80%,  diagonal branch treated w/ PCI and DES   . EXCISION MIDLINE SUPERIOR THYOID ISTHMUS MASS  11-27-2008   dr Ernesto Rutherford   bening lipoma  . KNEE ARTHROSCOPY Left 01-22-2004   dr Percell Miller  . POSTERIOR LUMBAR FUSION  11-14-2012   dr Christella Noa @[facility]    L3 -- 5  . SHOULDER ARTHROSCOPY Right 2019  . SHOULDER ARTHROSCOPY WITH ROTATOR CUFF REPAIR AND SUBACROMIAL DECOMPRESSION Left 11-12-2009   dr Percell Miller  @[facility]    w/ Labral debridement and DCR  . TONSILLECTOMY  age 66  . TOTAL KNEE ARTHROPLASTY Left 02/17/2016  Procedure: TOTAL KNEE ARTHROPLASTY;  Surgeon: Ninetta Lights, MD;  Location: [facility];  Service: Orthopedics;  Laterality: Left;  . TOTAL KNEE ARTHROPLASTY Right 02-25-2004   dr Percell Miller  @[facility]   . TRANSURETHRAL RESECTION OF BLADDER TUMOR N/A 08/31/2018   Procedure: TRANSURETHRAL RESECTION OF BLADDER TUMOR (TURBT) WITH INSTILLATION OF POST OPERATIVE CHEMOTHERAPY;  Surgeon: Kathie Rhodes, MD;  Location: [facility];  Service: Urology;  Laterality: N/A;  . VARICOSE VEIN SURGERY  1995     A IV Location/Drains/Wounds Patient  Lines/Drains/Airways Status   Active Line/Drains/Airways    Name:   Placement date:   Placement time:   Site:   Days:   Peripheral IV 11/22/18 Left Wrist   11/22/18    1100    Wrist   less than 1   Urethral Catheter DR. Ottelin Latex;Double-lumen 22 Fr.   08/31/18    1219    Latex;Double-lumen   83   Incision 11/14/12 Back Other (Comment)   11/14/12    1413     2199   Incision (Closed) 02/17/16 Knee Left   02/17/16    0854     1009   Wound 12/16/10 Abrasion(s);Laceration Leg Right;Lower laceration on the right lower leg   12/16/10    1933    Leg   2898   Wound 11/16/12 Abrasion(s) Face Lower   11/16/12    -    Face   2197          Intake/Output Last 24 hours No intake or output data in the 24 hours ending 11/22/18 1628  Labs/Imaging Results for orders placed or performed during the hospital encounter of 11/22/18 (from the past 48 hour(s))  Basic metabolic panel     Status: Abnormal   Collection Time: 11/22/18 11:26 AM  Result Value Ref Range   Sodium 136 135 - 145 mmol/L   Potassium 4.9 3.5 - 5.1 mmol/L   Chloride 103 98 - 111 mmol/L   CO2 22 22 - 32 mmol/L   Glucose, Bld 182 (H) 70 - 99 mg/dL   BUN 36 (H) 8 - 23 mg/dL   Creatinine, Ser 1.87 (H) 0.61 - 1.24 mg/dL   Calcium 9.0 8.9 - 10.3 mg/dL   GFR calc non Af Amer 36 (L) >60 mL/min   GFR calc Af Amer 42 (L) >60 mL/min   Anion gap 11 5 - 15    Comment: Performed at [facility], [address] [place], [place] [address]  CBC     Status: Abnormal   Collection Time: 11/22/18 11:26 AM  Result Value Ref Range   WBC 2.1 (L) 4.0 - 10.5 K/uL   RBC 2.59 (L) 4.22 - 5.81 MIL/uL   Hemoglobin 8.1 (L) 13.0 - 17.0 g/dL   HCT 24.2 (L) 39.0 - 52.0 %   MCV 93.4 80.0 - 100.0 fL   MCH 31.3 26.0 - 34.0 pg   MCHC 33.5 30.0 - 36.0 g/dL   RDW 16.0 (H) 11.5 - 15.5 %   Platelets 180 150 - 400 K/uL   nRBC 0.0 0.0 - 0.2 %    Comment: Performed at [facility], [place] [address], [place], [place] [address]  Troponin I (High  Sensitivity)     Status: Abnormal   Collection Time: 11/22/18 11:26 AM  Result Value Ref Range   Troponin I (High Sensitivity) 75 (H) <18 ng/L    Comment: (NOTE) Elevated high sensitivity troponin I (hsTnI) values and significant  changes across serial measurements  may suggest ACS but many other  chronic and acute conditions are known to elevate hsTnI results.  Refer to the "Links" section for chest pain algorithms and additional  guidance. Performed at [facility], [place] [address], [place], [place] [address]   Troponin I (High Sensitivity)     Status: Abnormal   Collection Time: 11/22/18  2:40 PM  Result Value Ref Range   Troponin I (High Sensitivity) 80 (H) <18 ng/L    Comment: (NOTE) Elevated high sensitivity troponin I (hsTnI) values and significant  changes across serial measurements may suggest ACS but many other  chronic and acute conditions are known to elevate hsTnI results.  Refer to the "Links" section for chest pain algorithms and additional  guidance. Performed at [facility], [place] [address] [place], [place] [address]    Dg Chest 2 View  Result Date: 11/22/2018 CLINICAL DATA:  68 year old male with a history of chest pressure EXAM: CHEST - 2 VIEW COMPARISON:  11/08/2012 FINDINGS: Cardiomediastinal silhouette within normal limits in size and contour. Low lung volumes with coarsened interstitial markings similar to the prior. No pneumothorax or pleural effusion. No confluent airspace disease. Degenerative changes of the spine with no displaced fracture. IMPRESSION: Negative for acute cardiopulmonary disease Electronically Signed   By: Corrie Mckusick D.O.   On: 11/22/2018 11:40    Pending Labs Unresulted Labs (From admission, onward)    Start     Ordered   11/22/18 1543  Brain natriuretic peptide  ONCE - STAT,   STAT     11/22/18 1542          Vitals/Pain Today's Vitals   11/22/18 1117 11/22/18 1119 11/22/18 1122 11/22/18 1410  BP:   (!) 144/83 (!)  146/78  Pulse:  70  72  Resp:  16  16  Temp:  98.1 F (36.7 C)  98.4 F (36.9 C)  TempSrc:  Oral  Oral  SpO2:  100%  100%  Weight: 108 kg     Height: 6' (1.829 m)     PainSc:        Isolation Precautions No active isolations  Medications Medications  sodium chloride flush (NS) 0.9 % injection 3 mL (has no administration in time range)    Mobility walks Low fall risk   Focused Assessments Cardiac Assessment Handoff:    Lab Results  Component Value Date   CKTOTAL 105 12/16/2010   CKMB 3.0 12/16/2010   TROPONINI <0.30 12/16/2010   No results found for: DDIMER Does the Patient currently have chest pain? Yes     R Recommendations: See Admitting Provider Note  Report given to:   Additional Notes:

## 2018-11-22 NOTE — H&P (Signed)
History and Physical    Chad Yu [id number] DOB: April 02, 1950 DOA: 11/22/2018  PCP: [name], MD   Patient coming from: Home  I have personally briefly reviewed patient's old medical records in [place]  Chief Complaint: Chest pain  HPI: Chad Yu is a 68 y.o. male with medical history significant of CAD status post stent, hyperlipidemia but intolerant to statins, hypertension, diabetes mellitus type 2, metastatic urothelial carcinoma with hepatic metastases currently undergoing chemotherapy presented with chest pain.  Patient states that he has been having on and off chest pain for the last 2 months.  He states that his chest pain is pressure-like, up to 5-7 out of 10 in intensity, exacerbated by exertion with no radiation and feels better with rest.  Patient denies worsening shortness of breath, palpitations, fever, cough, abdominal pain, nausea, vomiting, diarrhea or dysuria.  He was seen at the cancer center today with similar complaints and an EKG was done and subsequently he was transferred to [facility], ER for further evaluation.  ED Course: Initial troponin was 75.  Chest x-ray showed no acute abnormality.  EKG showed new changes compared to previous EKG.  Hospitalist was consulted to evaluate the patient.  ED provider consulted cardiology.  Second troponin level was 80.  Review of Systems: As per HPI otherwise all other 10 point systems were reviewed and are negative.   Past Medical History:  Diagnosis Date  . Bladder tumor   . Coronary artery disease cardiologist-- dr [name] (per pt last office visit 10/ 2018)   s/p  PCI w/ DES x1 to diagonal branch of LAD  . DDD (degenerative disc disease), lumbar   . Degenerative joint disease   . History of concussion    per pt age 46 w/ brief loc--- no residual  . Hyperlipidemia   . Hypertension   . OA (osteoarthritis)   . S/P drug eluting coronary stent placement 10/24/2007   diagonal branch of LAD  . Type 2  diabetes mellitus treated with insulin ([place])    followed by pcp  . Varicose vein of leg   . Wears glasses     Past Surgical History:  Procedure Laterality Date  . APPENDECTOMY  age 4  . COLONOSCOPY  2012  . CORONARY ANGIOPLASTY  08-26-2010   dr [name]   previous stent patent,  proximal prior to previous stent 99% stenosis;  Unsuccessful multiple attempts PCI, unability to cross with a ballon;  medical management  . CORONARY ANGIOPLASTY WITH STENT PLACEMENT  10-23-2017  dr Darnell Level brodie   positive ischemia on myoview;   40% pLAD and D1 80%,  diagonal branch treated w/ PCI and DES   . EXCISION MIDLINE SUPERIOR THYOID ISTHMUS MASS  11-27-2008   dr Ernesto Rutherford   bening lipoma  . KNEE ARTHROSCOPY Left 01-22-2004   dr Percell Miller  . POSTERIOR LUMBAR FUSION  11-14-2012   dr Christella Noa @[facility]    L3 -- 5  . SHOULDER ARTHROSCOPY Right 2019  . SHOULDER ARTHROSCOPY WITH ROTATOR CUFF REPAIR AND SUBACROMIAL DECOMPRESSION Left 11-12-2009   dr Percell Miller  @[facility]    w/ Labral debridement and DCR  . TONSILLECTOMY  age 74  . TOTAL KNEE ARTHROPLASTY Left 02/17/2016   Procedure: TOTAL KNEE ARTHROPLASTY;  Surgeon: Ninetta Lights, MD;  Location: [place];  Service: Orthopedics;  Laterality: Left;  . TOTAL KNEE ARTHROPLASTY Right 02-25-2004   dr Percell Miller  @[facility]   . TRANSURETHRAL RESECTION OF BLADDER TUMOR N/A 08/31/2018   Procedure: TRANSURETHRAL RESECTION OF BLADDER TUMOR (  TURBT) WITH INSTILLATION OF POST OPERATIVE CHEMOTHERAPY;  Surgeon: Kathie Rhodes, MD;  Location: [facility];  Service: Urology;  Laterality: N/A;  . [place]   Family history  reports that he has never smoked. He has never used smokeless tobacco. He reports that he does not drink alcohol or use drugs.  Allergies  Allergen Reactions  . Lisinopril Other (See Comments)    Severe cough Hair ball in throat  . Statins Hives and Other (See Comments)    Other reaction(s): Cough (ALLERGY/intolerance), Myalgias (intolerance) No cholesterol meds  .  Atorvastatin Other (See Comments)    REACTION: myalgies  . Rosuvastatin Itching    Family History  Problem Relation Age of Onset  . Heart attack Father   . Hypertension Father   . Heart attack Brother     Prior to Admission medications   Medication Sig Start Date End Date Taking? Authorizing Provider  Aspirin-Salicylamide-Caffeine (BC FAST PAIN RELIEF) 650-195-33.3 MG PACK Take 1 packet by mouth daily as needed (pain.).   Yes [provider]  diclofenac sodium (VOLTAREN) 1 % GEL Apply 1 application topically at bedtime as needed (pain.).   Yes [provider]  Dulaglutide (TRULICITY) 1.5 0000000 SOPN Inject 1.5 mg into the skin every Sunday.    Yes [provider]  empagliflozin (JARDIANCE) 25 MG TABS tablet Take 25 mg by mouth daily.   Yes [provider]  Homeopathic Products (THERAWORX GLOVE + FOAM EX) Apply 1 application topically 2 (two) times daily as needed (leg cramps).   Yes [provider]  insulin glargine (LANTUS) 100 UNIT/ML injection Inject 50 Units into the skin at bedtime.    Yes [provider]  insulin lispro (HUMALOG) 100 UNIT/ML KwikPen Inject 15 Units into the skin 2 (two) times daily. 09/19/18  Yes [provider]  metFORMIN (GLUCOPHAGE-XR) 500 MG 24 hr tablet Take 500 mg by mouth daily with breakfast.    Yes [provider]  olmesartan (BENICAR) 40 MG tablet Take 40 mg by mouth daily as needed (high blood pressure).    Yes [provider]  prochlorperazine (COMPAZINE) 10 MG tablet TAKE 1 TABLET BY MOUTH EVERY 6 HOURS AS NEEDED FOR NAUSEA AND VOMITING Patient taking differently: Take 10 mg by mouth every 6 (six) hours as needed for nausea or vomiting.  11/12/18  Yes Wyatt Portela, MD  tetrahydrozoline 0.05 % ophthalmic solution Place 1 drop into both eyes at bedtime as needed (eye irritation).   Yes [provider]  fenofibrate (TRICOR) 145 MG tablet Take 145 mg by mouth daily.     08/02/18  [provider]    Physical Exam: Vitals:   11/22/18 1530 11/22/18 1545 11/22/18 1600 11/22/18 1615  BP: (!) 150/74 139/82 (!) 156/74 (!) 143/74  Pulse: 68 69 70 64  Resp: 20 18 16  (!) 22  Temp:      TempSrc:      SpO2: 100% 100% 100% 100%  Weight:      Height:        Constitutional: NAD, calm, comfortable Vitals:   11/22/18 1530 11/22/18 1545 11/22/18 1600 11/22/18 1615  BP: (!) 150/74 139/82 (!) 156/74 (!) 143/74  Pulse: 68 69 70 64  Resp: 20 18 16  (!) 22  Temp:      TempSrc:      SpO2: 100% 100% 100% 100%  Weight:      Height:       Eyes: PERRL, lids and conjunctivae normal  ENMT: Mucous membranes are moist. Posterior pharynx clear of any exudate or lesions. Neck: normal, supple, no masses, no thyromegaly Respiratory: bilateral decreased breath sounds at bases, no wheezing, no crackles. Normal respiratory effort. No accessory muscle use.  Cardiovascular: S1 S2 positive, rate controlled. No extremity edema. 2+ pedal pulses.  Abdomen: no tenderness, no masses palpated. No hepatosplenomegaly. Bowel sounds positive.  Musculoskeletal: no clubbing / cyanosis. No joint deformity upper and lower extremities.  Skin: no rashes, lesions, ulcers. No induration Neurologic: CN 2-12 grossly intact. Moving extremities. No focal neurologic deficits.  Psychiatric: Normal judgment and insight. Alert and oriented x 3. Normal mood.    Labs on Admission: I have personally reviewed following labs and imaging studies  CBC: Recent Labs  Lab 11/22/18 0845 11/22/18 1126  WBC 1.9* 2.1*  NEUTROABS 0.9*  --   HGB 8.1* 8.1*  HCT 23.2* 24.2*  MCV 90.3 93.4  PLT 179 99991111   Basic Metabolic Panel: Recent Labs  Lab 11/22/18 0845 11/22/18 1126  NA 138 136  K 4.7 4.9  CL 103 103  CO2 21* 22  GLUCOSE 196* 182*  BUN 37* 36*  CREATININE 1.98* 1.87*  CALCIUM 8.9 9.0   GFR: Estimated Creatinine Clearance: 48 mL/min (A) (by C-G formula based on SCr of 1.87 mg/dL (H)).  Liver Function Tests: Recent Labs  Lab 11/22/18 0845  AST 21  ALT 29  ALKPHOS 62  BILITOT 0.5  PROT 7.0  ALBUMIN 3.9   No results for input(s): LIPASE, AMYLASE in the last 168 hours. No results for input(s): AMMONIA in the last 168 hours. Coagulation Profile: No results for input(s): INR, PROTIME in the last 168 hours. Cardiac Enzymes: No results for input(s): CKTOTAL, CKMB, CKMBINDEX, TROPONINI in the last 168 hours. BNP (last 3 results) No results for input(s): PROBNP in the last 8760 hours. HbA1C: No results for input(s): HGBA1C in the last 72 hours. CBG: No results for input(s): GLUCAP in the last 168 hours. Lipid Profile: No results for input(s): Chad, HDL, LDLCALC, TRIG, CHOLHDL, LDLDIRECT in the last 72 hours. Thyroid Function Tests: No results for input(s): TSH, T4TOTAL, FREET4, T3FREE, THYROIDAB in the last 72 hours. Anemia Panel: No results for input(s): VITAMINB12, FOLATE, FERRITIN, TIBC, IRON, RETICCTPCT in the last 72 hours. Urine analysis:    Component Value Date/Time   COLORURINE YELLOW 11/26/2008 [place] 11/26/2008 0947   LABSPEC 1.018 11/26/2008 0947   PHURINE 5.0 11/26/2008 0947   GLUCOSEU 100 (A) 11/26/2008 0947   HGBUR NEGATIVE 11/26/2008 [place] 11/26/2008 0947   [place] 11/26/2008 0947   PROTEINUR NEGATIVE 11/26/2008 0947   UROBILINOGEN 0.2 11/26/2008 0947   NITRITE NEGATIVE 11/26/2008 0947   LEUKOCYTESUR  11/26/2008 0947    NEGATIVE MICROSCOPIC NOT DONE ON URINES WITH NEGATIVE PROTEIN, BLOOD, LEUKOCYTES, NITRITE, OR GLUCOSE <1000 mg/dL.    Radiological Exams on Admission: Dg Chest 2 View  Result Date: 11/22/2018 CLINICAL DATA:  68 year old male with a history of chest pressure EXAM: CHEST - 2 VIEW COMPARISON:  11/08/2012 FINDINGS: Cardiomediastinal silhouette within normal limits in size and contour. Low lung volumes with coarsened interstitial markings similar to the prior. No pneumothorax or  pleural effusion. No confluent airspace disease. Degenerative changes of the spine with no displaced fracture. IMPRESSION: Negative for acute cardiopulmonary disease Electronically Signed   By: Corrie Mckusick D.O.   On: 11/22/2018 11:40    EKG done in the ED on presentation: Independently reviewed.  Normal sinus rhythm with  no ST elevations or depression.  Assessment/Plan  Chest pain in a patient with history of coronary disease and history of stents -Initial troponin was 75 and repeat troponin was 80.  EKG done in the cancer center today showed some changes compared to the prior EKG. -Cardiology consult.  2D echo.  Aspirin.  Patient is intolerant to statins. -N.p.o. past midnight in case patient needs some cardiology intervention.  Hypertension -Monitor blood pressure.  Continue home regimen  Diabetes mellitus type 2 -Continue home regimen.  Carb modified diet.  CBGs with SSI  Probable chronic kidney disease stage III -Monitor creatinine.  Anemia of chronic disease Leukopenia -Anemia is probably from chronic kidney disease.  Leukopenia probably is from patient being on chemotherapy.  Monitor.  No signs of bleeding.  Metastatic urothelial cancer with hepatic metastases -Currently on chemotherapy.  Outpatient follow-up with oncology    DVT prophylaxis: Lovenox Code Status: Full Family Communication: Spoke to patient at bedside Disposition Plan: Home in 1 to 2 days once cleared by cardiology Consults called: Cardiology Admission status: Observation/telemetry  Severity of Illness: The appropriate patient status for this patient is OBSERVATION. Observation status is judged to be reasonable and necessary in order to provide the required intensity of service to ensure the patient's safety. The patient's presenting symptoms, physical exam findings, and initial radiographic and laboratory data in the context of their medical condition is felt to place them at decreased risk for further  clinical deterioration. Furthermore, it is anticipated that the patient will be medically stable for discharge from the hospital within 2 midnights of admission. The following factors support the patient status of observation.   " The patient's presenting symptoms include chest pain. " The initial radiographic and laboratory data are positive troponin and some EKG changes.      Aline August MD [facility]  11/22/2018, 4:36 PM

## 2018-11-22 NOTE — Patient Instructions (Signed)
Neutropenia Neutropenia is a condition that occurs when you have a lower-than-normal level of a type of white blood cell (neutrophil) in your body. Neutrophils are made in the spongy center of large bones (bone marrow), and they fight infections. Neutrophils are your body's main defense against bacterial and fungal infections. The fewer neutrophils you have and the longer your body remains without them, the greater your risk of getting a severe infection. What are the causes? This condition can occur if your body uses up or destroys neutrophils faster than your bone marrow can make them. Neutropenia may be caused by:  A bacterial or fungal infection.  Allergic disorders.  Reactions to some medicines.  An autoimmune disease.  An enlarged spleen. This condition can also occur if your bone marrow does not produce enough neutrophils. This problem may be caused by:  Cancer.  Cancer treatments, such as radiation or chemotherapy.  Viral infections.  Medicines, such as phenytoin.  Vitamin B12 deficiency.  Diseases of the bone marrow.  Environmental toxins, such as insecticides. What are the signs or symptoms? This condition does not usually cause symptoms. If symptoms are present, they are usually caused by an underlying infection. Symptoms of an infection may include:  Fever.  Chills.  Swollen glands.  Oral or anal ulcers.  Cough and shortness of breath.  Rash.  Skin infection.  Fatigue. How is this diagnosed? Your health care provider may suspect neutropenia if you have:  A condition that may cause neutropenia.  Symptoms during or after treatment for cancer.  Symptoms of infection, especially fever.  Frequent and unusual infections. This condition is diagnosed based on your medical history and a physical exam. Tests will also be done, such as:  A complete blood count (CBC).  A procedure to collect a sample of bone marrow for examination (bone marrow biopsy).   A chest X-ray.  A urine culture.  A blood culture. How is this treated? Treatment depends on the underlying cause and severity of your condition. Mild neutropenia may not require treatment. Treatment may include medicines, such as:  Antibiotic medicine given through an IV.  Antiviral medicines.  Antifungal medicines.  A medicine to increase neutrophil production (colony-stimulating factor). You may get this drug through an IV or by injection.  Steroids given through an IV. If an underlying condition is causing neutropenia, you may need treatment for that condition. If medicines or cancer treatments are causing neutropenia, your health care provider may have you stop the medicines or treatment. Follow these instructions at home: Medicines   Take over-the-counter and prescription medicines only as told by your health care provider.  Get a seasonal flu shot (influenza vaccine).  Avoid people who received a vaccine in the past 30 days if that vaccine contained a live version of the germ (live vaccine). You should not get a live vaccine. Common live vaccines are polio, MMR, chicken pox, and shingles vaccines. Eating and drinking  Do not share food utensils.  Do not eat unpasteurized foods.  Do not eat raw or undercooked meat, eggs, or seafood.  Do not eat unwashed, raw fruits or vegetables. Lifestyle  Avoid exposure to groups of people or children.  Avoid being around people who are sick.  Avoid being around dirt or dust, such as in construction areas or gardens.  Do not provide direct care for pets. Avoid animal droppings. Do not clean litter boxes and bird cages.  Do not have sex unless your health care provider has approved. Hygiene     Bathe daily.  Clean the area between the genitals and the anus (perineal area) after you urinate or have a bowel movement. If you are male, wipe from front to back.  Brush your teeth with a soft toothbrush before and after meals.   Do not use a regular razor. Use an electric razor to remove hair.  Wash your hands often. Make sure others who come in contact with you also wash their hands. If soap and water are not available, use hand sanitizer. General instructions  Follow any precautions as told by your health care provider to reduce your risk for injury or infection.  Take actions to avoid cuts and burns. For example: ? Be cautious when you use knives. Always cut away from yourself. ? Keep knives in protective sheaths or guards when not in use. ? Use oven mitts when you cook with a hot stove, oven, or grill. ? Stand a safe distance away from open fires.  Do not use tampons, enemas, or rectal suppositories unless your health care provider has approved.  Keep all follow-up visits as told by your health care provider. This is important. Contact a health care provider if:  You have: ? A sore throat. ? A warm, red, or tender area on your skin. ? A cough. ? Frequent or painful urination. ? Vaginal discharge or itching.  You develop: ? Sores in your mouth or anus. ? Swollen lymph nodes. ? Red streaks on the skin. ? A rash. Get help right away if:  You have: ? A fever. ? Chills, or you start to shake.  You feel: ? Nauseous, or you vomit. ? Very fatigued. ? Short of breath. Summary  Neutropenia is a condition that occurs when you have a lower-than-normal level of a type of white blood cell (neutrophil) in your body.  This condition can occur if your body uses up or destroys neutrophils faster than your bone marrow can make them.  Treatment depends on the underlying cause and severity of your condition. Mild neutropenia may not require treatment.  Follow any precautions as told by your health care provider to reduce your risk for injury or infection. This information is not intended to replace advice given to you by your health care provider. Make sure you discuss any questions you have with your health  care provider. Document Released: 06/25/2001 Document Revised: 10/19/2017 Document Reviewed: 10/19/2017 [employer]vier Patient Education  2020 [employer]  

## 2018-11-22 NOTE — Consult Note (Signed)
Cardiology Consultation:   Patient ID: Chad Yu MRN: [id number]; DOB: March 28, 1950  Admit date: 11/22/2018 Date of Consult: 11/22/2018  Primary Care Provider: [name], MD Primary Cardiologist: Lauree Chandler, MD  Primary Electrophysiologist:  None    Patient Profile:   Chad Yu is a 68 y.o. male with a hx of CAD with prior stent to first diag in 2009 and patent in 2012 but sub-total occlusion prior to the stent but could not cross wire, HLD and intolerant to statins but does well PCSK9, , no BB with borderline brady and bladder cancer with transurethral resection of Bladder tumor chemo, for metastatic urothelial carcinoma with hepatic metastasis, who is being seen today for the evaluation of chest pain at the request of Dr. Starla Link..  History of Present Illness:   Mr. Zervas with above hx and last cath 2012 hx of CAD with prior stent to first diag in 2009 and patent in 2012 but sub-total occlusion prior to the stent but could not cross wire, neg nuc study 2017 for ischemia.    We saw last in July and he was cleared for surgery.  He has done well though he admits no PCSK9 in 2 years due to cost, insurance quit paying.     Today when he was seen by oncology he related he had a 45 minute episode of 9/10 SSCP and pressure on 11/20/2018. He had a recurrence of substernal chest pressure while walking in to the cancer center today. An EKG showed nonspecific ST and T wave changes.  His treatment was held and he was transported to [facility] ER by EMS.  He has had ASA 324 mg.   His angina began about 12 weeks ago when he began chemo. Does not awaken from sleep.  Only occurs with walking to car or shed.  Resolves with rest.  He has not had NTG in some time. Tuesday he exerted more than usual and when he came home he had significant chest pain.  Sat sitting up for 45 min before it resolved.  Since then he is back to his pain with walking anything more than in the house.  No associated  symptoms of nausea, SOB, diaphoresis.  Currently pain free.         EKG:  The EKG was personally reviewed and demonstrates:  Non specific ST abnormalities in V2-V6 and I, II, new from 08/09/18. Telemetry:  Telemetry was personally reviewed and demonstrates:  SR  Troponin 75; 80  Na 136, K+ 4.9, BUN 36 and Cr 1.87 Hgb 8.1 hct 24.2 and WBC 2.1 plts 180 , hgb 10/11/18 was 13.   2V CXR neg for cardiopulmonary abnormality   Currently BP 154/77 afebrile, and no pain.  Heart Pathway Score:  HEAR Score: 6  Past Medical History:  Diagnosis Date  . Bladder tumor   . Coronary artery disease cardiologist-- dr [name] (per pt last office visit 10/ 2018)   s/p  PCI w/ DES x1 to diagonal branch of LAD  . DDD (degenerative disc disease), lumbar   . Degenerative joint disease   . History of concussion    per pt age 74 w/ brief loc--- no residual  . Hyperlipidemia   . Hypertension   . OA (osteoarthritis)   . S/P drug eluting coronary stent placement 10/24/2007   diagonal branch of LAD  . Type 2 diabetes mellitus treated with insulin ([place])    followed by pcp  . Varicose vein of leg   . Wears glasses  Past Surgical History:  Procedure Laterality Date  . APPENDECTOMY  age 62  . COLONOSCOPY  2012  . CORONARY ANGIOPLASTY  08-26-2010   dr [name]   previous stent patent,  proximal prior to previous stent 99% stenosis;  Unsuccessful multiple attempts PCI, unability to cross with a ballon;  medical management  . CORONARY ANGIOPLASTY WITH STENT PLACEMENT  10-23-2017  dr Darnell Level brodie   positive ischemia on myoview;   40% pLAD and D1 80%,  diagonal branch treated w/ PCI and DES   . EXCISION MIDLINE SUPERIOR THYOID ISTHMUS MASS  11-27-2008   dr Ernesto Rutherford   bening lipoma  . KNEE ARTHROSCOPY Left 01-22-2004   dr Percell Miller  . POSTERIOR LUMBAR FUSION  11-14-2012   dr Christella Noa @[facility]    L3 -- 5  . SHOULDER ARTHROSCOPY Right 2019  . SHOULDER ARTHROSCOPY WITH ROTATOR CUFF REPAIR AND SUBACROMIAL DECOMPRESSION  Left 11-12-2009   dr Percell Miller  @[facility]    w/ Labral debridement and DCR  . TONSILLECTOMY  age 35  . TOTAL KNEE ARTHROPLASTY Left 02/17/2016   Procedure: TOTAL KNEE ARTHROPLASTY;  Surgeon: Ninetta Lights, MD;  Location: [place];  Service: Orthopedics;  Laterality: Left;  . TOTAL KNEE ARTHROPLASTY Right 02-25-2004   dr Percell Miller  @[facility]   . TRANSURETHRAL RESECTION OF BLADDER TUMOR N/A 08/31/2018   Procedure: TRANSURETHRAL RESECTION OF BLADDER TUMOR (TURBT) WITH INSTILLATION OF POST OPERATIVE CHEMOTHERAPY;  Surgeon: Kathie Rhodes, MD;  Location: [facility];  Service: Urology;  Laterality: N/A;  . VARICOSE VEIN SURGERY  1995     Home Medications:  Prior to Admission medications   Medication Sig Start Date End Date Taking? Authorizing Provider  Aspirin-Salicylamide-Caffeine (BC FAST PAIN RELIEF) 650-195-33.3 MG PACK Take 1 packet by mouth daily as needed (pain.).   Yes [provider]  diclofenac sodium (VOLTAREN) 1 % GEL Apply 1 application topically at bedtime as needed (pain.).   Yes [provider]  Dulaglutide (TRULICITY) 1.5 0000000 SOPN Inject 1.5 mg into the skin every Sunday.    Yes [provider]  empagliflozin (JARDIANCE) 25 MG TABS tablet Take 25 mg by mouth daily.   Yes [provider]  Homeopathic Products (THERAWORX GLOVE + FOAM EX) Apply 1 application topically 2 (two) times daily as needed (leg cramps).   Yes [provider]  insulin glargine (LANTUS) 100 UNIT/ML injection Inject 50 Units into the skin at bedtime.    Yes [provider]  insulin lispro (HUMALOG) 100 UNIT/ML KwikPen Inject 15 Units into the skin 2 (two) times daily. 09/19/18  Yes [provider]  metFORMIN (GLUCOPHAGE-XR) 500 MG 24 hr tablet Take 500 mg by mouth daily with breakfast.    Yes [provider]  olmesartan (BENICAR) 40 MG tablet Take 40 mg by mouth daily as needed (high blood pressure).    Yes [provider]  prochlorperazine (COMPAZINE) 10 MG  tablet TAKE 1 TABLET BY MOUTH EVERY 6 HOURS AS NEEDED FOR NAUSEA AND VOMITING Patient taking differently: Take 10 mg by mouth every 6 (six) hours as needed for nausea or vomiting.  11/12/18  Yes Wyatt Portela, MD  tetrahydrozoline 0.05 % ophthalmic solution Place 1 drop into both eyes at bedtime as needed (eye irritation).   Yes [provider]  fenofibrate (TRICOR) 145 MG tablet Take 145 mg by mouth daily.    08/02/18  [provider]    Inpatient Medications: Scheduled Meds: . sodium chloride flush  3 mL Intravenous Once   Continuous Infusions:  PRN Meds:   Allergies:    Allergies  Allergen Reactions  . Lisinopril Other (See Comments)    Severe cough Hair ball in throat  . Statins Hives and Other (See Comments)    Other reaction(s): Cough (ALLERGY/intolerance), Myalgias (intolerance) No cholesterol meds  . Atorvastatin Other (See Comments)    REACTION: myalgies  . Rosuvastatin Itching    Social History:   Social History   Socioeconomic History  . Marital status: Married    Spouse name: Not on file  . Number of children: 4  . Years of education: Not on file  . Highest education level: Not on file  Occupational History  . 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 Financial resource strain: Not on file  . Food insecurity    Worry: Not on file    Inability: Not on file  . Transportation needs    Medical: Not on file    Non-medical: Not on file  Tobacco Use  . Smoking status: Never Smoker  . Smokeless tobacco: Never Used  Substance and Sexual Activity  . Alcohol use: No  . Drug use: No  . Sexual activity: Not on file  Lifestyle  . Physical activity    Days per week: Not on file    Minutes per session: Not on file  . Stress: Not on file  Relationships  . Social Herbalist on phone: Not on file    Gets together: Not on file    Attends religious service: Not on file    Active member  of club or organization: Not on file    Attends meetings of clubs or organizations: Not on file    Relationship status: Not on file  . Intimate partner violence    Fear of current or ex partner: Not on file    Emotionally abused: Not on file    Physically abused: Not on file    Forced sexual activity: Not on file  Other Topics Concern  . Not on file  Social History Narrative   The patient lives in [place] with his wife. He works in Leisure centre manager. He denies tobacco or alcohol abuse . He denies regular exercise.    Family History:    Family History  Problem Relation Age of Onset  . Heart attack Father   . Hypertension Father   . Heart attack Brother      ROS:  Please see the history of present illness.  General:no colds or fevers, no weight changes Skin:no rashes or ulcers HEENT:no blurred vision, no congestion CV:see HPI PUL:see HPI GI:no diarrhea constipation or melena, no indigestion GU:no hematuria, no dysuria MS:no joint pain, no claudication Neuro:no syncope, no lightheadedness Endo:no diabetes, no thyroid disease  All other ROS reviewed and negative.     Physical Exam/Data:   Vitals:   11/22/18 1117 11/22/18 1119 11/22/18 1122 11/22/18 1410  BP:   (!) 144/83 (!) 146/78  Pulse:  70  72  Resp:  16  16  Temp:  98.1 F (36.7 C)  98.4 F (36.9 C)  TempSrc:  Oral  Oral  SpO2:  100%  100%  Weight: 108 kg     Height: 6' (1.829 m)      No intake or output data in the 24 hours ending 11/22/18 1626 Last 3 Weights 11/22/2018 11/14/2018 11/01/2018  Weight (lbs) 238 lb 235 lb 14.4 oz 238 lb  Weight (kg) 107.956 kg 107.004 kg  107.956 kg     Body mass index is 32.28 kg/m.  General:  Well nourished, well developed, in no acute distress HEENT: normal Lymph: no adenopathy Neck: no JVD Endocrine:  No thryomegaly Vascular: No carotid bruits; FA pulses 2+ bilaterally without bruits  Cardiac:  normal S1, S2; RRR; no murmur gallup rub or click Lungs:   clear to auscultation bilaterally, no wheezing, rhonchi or rales  Abd: soft, nontender, no hepatomegaly  Ext: no edema Musculoskeletal:  No deformities, BUE and BLE strength normal and equal Skin: warm and dry  Neuro:  Alert and oriented X 3 MAE follows commands, no focal abnormalities noted Psych:  Normal affect    Relevant CV Studies: nuc 2017 neg for ischemia  Laboratory Data:  High Sensitivity Troponin:   Recent Labs  Lab 11/22/18 1126 11/22/18 1440  TROPONINIHS 75* 80*     Chemistry Recent Labs  Lab 11/22/18 0845 11/22/18 1126  NA 138 136  K 4.7 4.9  CL 103 103  CO2 21* 22  GLUCOSE 196* 182*  BUN 37* 36*  CREATININE 1.98* 1.87*  CALCIUM 8.9 9.0  GFRNONAA 34* 36*  GFRAA 39* 42*  ANIONGAP 14 11    Recent Labs  Lab 11/22/18 0845  PROT 7.0  ALBUMIN 3.9  AST 21  ALT 29  ALKPHOS 62  BILITOT 0.5   Hematology Recent Labs  Lab 11/22/18 0845 11/22/18 1126  WBC 1.9* 2.1*  RBC 2.57* 2.59*  HGB 8.1* 8.1*  HCT 23.2* 24.2*  MCV 90.3 93.4  MCH 31.5 31.3  MCHC 34.9 33.5  RDW 16.0* 16.0*  PLT 179 180   BNPNo results for input(s): BNP, PROBNP in the last 168 hours.  DDimer No results for input(s): DDIMER in the last 168 hours.   Radiology/Studies:  Dg Chest 2 View  Result Date: 11/22/2018 CLINICAL DATA:  68 year old male with a history of chest pressure EXAM: CHEST - 2 VIEW COMPARISON:  11/08/2012 FINDINGS: Cardiomediastinal silhouette within normal limits in size and contour. Low lung volumes with coarsened interstitial markings similar to the prior. No pneumothorax or pleural effusion. No confluent airspace disease. Degenerative changes of the spine with no displaced fracture. IMPRESSION: Negative for acute cardiopulmonary disease Electronically Signed   By: Corrie Mckusick D.O.   On: 11/22/2018 11:40    Assessment and Plan:   1. Unstable angina with new EKG changes for depression but also with anemia.  Sounds anginal. Will add IV heparin and check Hgb  later tonight, recheck troponins  Plan for cardiac cath tomorrow  Dr Audie Box discussed with Dr. Lindi Adie with oncology  2.  CAD with prior stent to first diag.  No BB due to brady.  Is on ARB 3. HLD need to resume Repatha as outpt  4. DM-2 on Jardiance       For questions or updates, please contact [place] Please consult [email or web address] for contact info under     Signed, Cecilie Kicks, NP  11/22/2018 4:26 PM

## 2018-11-22 NOTE — ED Triage Notes (Signed)
Pt arrives via EMS from [facility] chemo center for bladder cancer- complaining of CP with exersion. Heaviness- worsens with ambulation. Cancer PA noticed nonspecific changes on EKG. Denies pain at rest. EMS gave 324mg  asa. Bp 156/70, HR 72, 18 resp, 98%.

## 2018-11-22 NOTE — Progress Notes (Signed)
Symptoms Management Clinic Progress Note   Chad Yu [id number] 1950/08/25 68 y.o.  Chad Yu is managed by Dr. Zola Button  Actively treated with chemotherapy/immunotherapy/hormonal therapy: yes  Current therapy: carboplatin and gemcitabine  Last treated: 11/15/2018 (cycle 3, day 1)  Next scheduled appointment with provider: 12/05/2018  Assessment: Plan:    Atherosclerosis of native coronary artery of native heart, angina presence unspecified  Essential hypertension  Malignant neoplasm of urinary bladder, unspecified site [facility])  Other forms of angina pectoris ([place])   CAD with stent, hypertension and angina: The patient had a 45 minute episode of 9/10 SSCP and pressure on 11/20/2018 for 45 minutes. He had a recurrence of substernal chest pressure while walking in to the cancer center today. An EKG showed nonspecific ST and T wave changes. The patient is being transported to the [facility] ER for evaluation and management, The ER Charge Nurse was called with report given.  Metastatic urothelial carcinoma with hepatic metastasis: The patient is currently treated with carboplatin and gemcitabine. He presented for cycle 3, day 8 of chemotherapy today. He treatment is being held due to neutropenia.  Please see After Visit Summary for patient specific instructions.  Future Appointments  Date Time Provider [place]  11/30/2018 11:00 AM [facility] PET CT 1 [facility] [place]  12/05/2018  3:15 PM [facility] LAB 2 [facility] None  12/05/2018  3:45 PM Shadad, Mathis Dad, MD [facility] None  12/06/2018  9:30 AM [facility] INFUSION [facility] None  12/14/2018  8:45 AM [facility] LAB 2 [facility] None  12/14/2018  9:15 AM [facility] INFUSION [facility] None    No orders of the defined types were placed in this encounter.      Subjective:   Patient ID:  Chad Yu is a 68 y.o. (DOB 1950/10/06) male.  Chief Complaint: No chief complaint on file.   HPI  Chad Yu  Is a 68 y.o. male with a diagnosis of a metastatic urothelial carcinoma with hepatic metastasis. He is status post TURBT completed in August 2020 which showed high-grade urothelial carcinoma with muscle invasion. He was treated with neoadjuvant chemotherapy of cisplatin and gemcitabine started on 10/04/2018.  He received gemcitabine 1000 mg per metered square, cisplatin 70 mg per metered square on day 1 and gemcitabine 1000 mg per metered square on day 8 out of 21-day cycle. He additionally has CAD with a stent, hypertension and angina. He had a 45 minute episode of 9/10 SSCP and pressure on 11/20/2018 for 45 minutes. He had a recurrence of substernal chest pressure while walking in to the cancer center today. An EKG showed nonspecific ST and T wave changes. His cardiologist is Dr. Lauree Chandler.  Medications: I have reviewed the patient's current medications.  Allergies:  Allergies  Allergen Reactions  . Lisinopril Other (See Comments)    Severe cough Hair ball in throat  . Statins Hives and Other (See Comments)    Other reaction(s): Cough (ALLERGY/intolerance), Myalgias (intolerance) No cholesterol meds  . Atorvastatin Other (See Comments)    REACTION: myalgies  . Rosuvastatin Itching    Past Medical History:  Diagnosis Date  . Bladder tumor   . Coronary artery disease cardiologist-- dr [name] (per pt last office visit 10/ 2018)   s/p  PCI w/ DES x1 to diagonal branch of LAD  . DDD (degenerative disc disease), lumbar   . Degenerative joint disease   . History of concussion    per pt age 33 w/ brief loc--- no residual  . Hyperlipidemia   .  Hypertension   . OA (osteoarthritis)   . S/P drug eluting coronary stent placement 10/24/2007   diagonal branch of LAD  . Type 2 diabetes mellitus treated with insulin ([place])    followed by pcp  . Varicose vein of leg   . Wears glasses     Past Surgical History:  Procedure Laterality Date  . APPENDECTOMY  age 54  .  COLONOSCOPY  2012  . CORONARY ANGIOPLASTY  08-26-2010   dr [name]   previous stent patent,  proximal prior to previous stent 99% stenosis;  Unsuccessful multiple attempts PCI, unability to cross with a ballon;  medical management  . CORONARY ANGIOPLASTY WITH STENT PLACEMENT  10-23-2017  dr Darnell Level brodie   positive ischemia on myoview;   40% pLAD and D1 80%,  diagonal branch treated w/ PCI and DES   . EXCISION MIDLINE SUPERIOR THYOID ISTHMUS MASS  11-27-2008   dr Ernesto Rutherford   bening lipoma  . KNEE ARTHROSCOPY Left 01-22-2004   dr Percell Miller  . POSTERIOR LUMBAR FUSION  11-14-2012   dr Christella Noa @[facility]    L3 -- 5  . SHOULDER ARTHROSCOPY Right 2019  . SHOULDER ARTHROSCOPY WITH ROTATOR CUFF REPAIR AND SUBACROMIAL DECOMPRESSION Left 11-12-2009   dr Percell Miller  @[facility]    w/ Labral debridement and DCR  . TONSILLECTOMY  age 18  . TOTAL KNEE ARTHROPLASTY Left 02/17/2016   Procedure: TOTAL KNEE ARTHROPLASTY;  Surgeon: Ninetta Lights, MD;  Location: [place];  Service: Orthopedics;  Laterality: Left;  . TOTAL KNEE ARTHROPLASTY Right 02-25-2004   dr Percell Miller  @[facility]   . TRANSURETHRAL RESECTION OF BLADDER TUMOR N/A 08/31/2018   Procedure: TRANSURETHRAL RESECTION OF BLADDER TUMOR (TURBT) WITH INSTILLATION OF POST OPERATIVE CHEMOTHERAPY;  Surgeon: Kathie Rhodes, MD;  [facility];  Service: Urology;  Laterality: N/A;  . VARICOSE VEIN SURGERY  1995    Family History  Problem Relation Age of Onset  . Heart attack Father   . Hypertension Father   . Heart attack Brother     Social History   Socioeconomic History  . Marital status: Married    Spouse name: Not on file  . Number of children: 4  . Years of education: Not on file  . Highest education level: Not on file  Occupational History  . 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 Financial resource strain: Not on file  . Food insecurity    Worry: Not on file    Inability: Not on file  . Transportation needs    Medical:  Not on file    Non-medical: Not on file  Tobacco Use  . Smoking status: Never Smoker  . Smokeless tobacco: Never Used  Substance and Sexual Activity  . Alcohol use: No  . Drug use: No  . Sexual activity: Not on file  Lifestyle  . Physical activity    Days per week: Not on file    Minutes per session: Not on file  . Stress: Not on file  Relationships  . Social Herbalist on phone: Not on file    Gets together: Not on file    Attends religious service: Not on file    Active member of club or organization: Not on file    Attends meetings of clubs or organizations: Not on file    Relationship status: Not on file  . Intimate partner violence    Fear of current or ex partner: Not on file  Emotionally abused: Not on file    Physically abused: Not on file    Forced sexual activity: Not on file  Other Topics Concern  . Not on file  Social History Narrative   The patient lives in [place] with his wife. He works in Leisure centre manager. He denies tobacco or alcohol abuse . He denies regular exercise.    Past Medical History, Surgical history, Social history, and Family history were reviewed and updated as appropriate.   Please see review of systems for further details on the patient's review from today.   Review of Systems:  Review of Systems  Constitutional: Negative for chills, diaphoresis and fever.  HENT: Negative for trouble swallowing and voice change.   Respiratory: Positive for chest tightness. Negative for cough, shortness of breath and wheezing.   Cardiovascular: Positive for chest pain. Negative for palpitations.       Substernal chest pressure with ambulation  Gastrointestinal: Negative for abdominal pain, constipation, diarrhea, nausea and vomiting.  Musculoskeletal: Negative for back pain and myalgias.  Neurological: Negative for dizziness, light-headedness and headaches.    Objective:   Physical Exam:  There were no vitals taken for this  visit. ECOG: 0  Vital Signs:  BP: 142 / 91 Pulse: 78 Temp: 99.1 Resp: 16 SPO2: 100% on room air.   Physical Exam Constitutional:      General: He is not in acute distress.    Appearance: He is not diaphoretic.  HENT:     Head: Normocephalic and atraumatic.  Cardiovascular:     Rate and Rhythm: Normal rate and regular rhythm.     Heart sounds: Normal heart sounds. No murmur. No friction rub. No gallop.   Pulmonary:     Effort: Pulmonary effort is normal. No respiratory distress.     Breath sounds: Normal breath sounds. No wheezing or rales.  Skin:    General: Skin is warm and dry.     Findings: No erythema or rash.     Comments: IV in the left forearm  Neurological:     Mental Status: He is alert.     Lab Review:     Component Value Date/Time   NA 138 11/22/2018 0845   K 4.7 11/22/2018 0845   CL 103 11/22/2018 0845   CO2 21 (L) 11/22/2018 0845   GLUCOSE 196 (H) 11/22/2018 0845   BUN 37 (H) 11/22/2018 0845   CREATININE 1.98 (H) 11/22/2018 0845   CALCIUM 8.9 11/22/2018 0845   PROT 7.0 11/22/2018 0845   ALBUMIN 3.9 11/22/2018 0845   AST 21 11/22/2018 0845   ALT 29 11/22/2018 0845   ALKPHOS 62 11/22/2018 0845   BILITOT 0.5 11/22/2018 0845   GFRNONAA 34 (L) 11/22/2018 0845   GFRAA 39 (L) 11/22/2018 0845       Component Value Date/Time   WBC 1.9 (L) 11/22/2018 0845   WBC 5.7 08/23/2018 1158   RBC 2.57 (L) 11/22/2018 0845   HGB 8.1 (L) 11/22/2018 0845   HCT 23.2 (L) 11/22/2018 0845   PLT 179 11/22/2018 0845   MCV 90.3 11/22/2018 0845   MCH 31.5 11/22/2018 0845   MCHC 34.9 11/22/2018 0845   RDW 16.0 (H) 11/22/2018 0845   LYMPHSABS 0.9 11/22/2018 0845   MONOABS 0.1 11/22/2018 0845   EOSABS 0.1 11/22/2018 0845   BASOSABS 0.0 11/22/2018 0845   -------------------------------  Imaging from last 24 hours (if applicable):  Radiology interpretation: No results found.      This case was discussed Dr. Alen Blew.  He expressed agreement with my management of this  patient.

## 2018-11-22 NOTE — Progress Notes (Signed)
Pt states he had an hour long episode of CP on Tues.  He says his dog was sniffing of his chest during this episode.  Since then he experiences "heaviness in my heart" when he walks. Per Dr Alen Blew, treatment will be held today d/t low ANC and pt needs to be evaluated in ED or by his cardiologist.  Sandi Mealy, PA at chair side to evaluate pt. EKG obtained.   Pt will be transported to the [facility] ED via wheelchair. Pt is A/O, VSS, no c/o pain.  1044:  Pt transported via stretcher by non emergent transport team to [facility] ED. Pt A/O, VSS.

## 2018-11-23 ENCOUNTER — Observation Stay (HOSPITAL_COMMUNITY): Payer: Medicare Other

## 2018-11-23 DIAGNOSIS — E7800 Pure hypercholesterolemia, unspecified: Secondary | ICD-10-CM | POA: Diagnosis present

## 2018-11-23 DIAGNOSIS — Z981 Arthrodesis status: Secondary | ICD-10-CM | POA: Diagnosis not present

## 2018-11-23 DIAGNOSIS — Z955 Presence of coronary angioplasty implant and graft: Secondary | ICD-10-CM | POA: Diagnosis not present

## 2018-11-23 DIAGNOSIS — Z8249 Family history of ischemic heart disease and other diseases of the circulatory system: Secondary | ICD-10-CM | POA: Diagnosis not present

## 2018-11-23 DIAGNOSIS — Z794 Long term (current) use of insulin: Secondary | ICD-10-CM | POA: Diagnosis not present

## 2018-11-23 DIAGNOSIS — Z20828 Contact with and (suspected) exposure to other viral communicable diseases: Secondary | ICD-10-CM | POA: Diagnosis present

## 2018-11-23 DIAGNOSIS — I129 Hypertensive chronic kidney disease with stage 1 through stage 4 chronic kidney disease, or unspecified chronic kidney disease: Secondary | ICD-10-CM | POA: Diagnosis present

## 2018-11-23 DIAGNOSIS — E785 Hyperlipidemia, unspecified: Secondary | ICD-10-CM | POA: Diagnosis present

## 2018-11-23 DIAGNOSIS — E1169 Type 2 diabetes mellitus with other specified complication: Secondary | ICD-10-CM | POA: Diagnosis not present

## 2018-11-23 DIAGNOSIS — N1831 Chronic kidney disease, stage 3a: Secondary | ICD-10-CM | POA: Diagnosis present

## 2018-11-23 DIAGNOSIS — Z6832 Body mass index (BMI) 32.0-32.9, adult: Secondary | ICD-10-CM | POA: Diagnosis not present

## 2018-11-23 DIAGNOSIS — E1122 Type 2 diabetes mellitus with diabetic chronic kidney disease: Secondary | ICD-10-CM | POA: Diagnosis present

## 2018-11-23 DIAGNOSIS — R079 Chest pain, unspecified: Secondary | ICD-10-CM

## 2018-11-23 DIAGNOSIS — D631 Anemia in chronic kidney disease: Secondary | ICD-10-CM | POA: Diagnosis present

## 2018-11-23 DIAGNOSIS — T451X5A Adverse effect of antineoplastic and immunosuppressive drugs, initial encounter: Secondary | ICD-10-CM | POA: Diagnosis present

## 2018-11-23 DIAGNOSIS — D72819 Decreased white blood cell count, unspecified: Secondary | ICD-10-CM | POA: Diagnosis present

## 2018-11-23 DIAGNOSIS — Z888 Allergy status to other drugs, medicaments and biological substances status: Secondary | ICD-10-CM | POA: Diagnosis not present

## 2018-11-23 DIAGNOSIS — I200 Unstable angina: Secondary | ICD-10-CM | POA: Diagnosis present

## 2018-11-23 DIAGNOSIS — I10 Essential (primary) hypertension: Secondary | ICD-10-CM | POA: Diagnosis not present

## 2018-11-23 DIAGNOSIS — M5136 Other intervertebral disc degeneration, lumbar region: Secondary | ICD-10-CM | POA: Diagnosis present

## 2018-11-23 DIAGNOSIS — C679 Malignant neoplasm of bladder, unspecified: Secondary | ICD-10-CM | POA: Diagnosis present

## 2018-11-23 DIAGNOSIS — E669 Obesity, unspecified: Secondary | ICD-10-CM | POA: Diagnosis present

## 2018-11-23 DIAGNOSIS — I25110 Atherosclerotic heart disease of native coronary artery with unstable angina pectoris: Secondary | ICD-10-CM | POA: Diagnosis present

## 2018-11-23 DIAGNOSIS — Z96653 Presence of artificial knee joint, bilateral: Secondary | ICD-10-CM | POA: Diagnosis present

## 2018-11-23 DIAGNOSIS — C787 Secondary malignant neoplasm of liver and intrahepatic bile duct: Secondary | ICD-10-CM | POA: Diagnosis present

## 2018-11-23 DIAGNOSIS — K219 Gastro-esophageal reflux disease without esophagitis: Secondary | ICD-10-CM | POA: Diagnosis present

## 2018-11-23 DIAGNOSIS — I214 Non-ST elevation (NSTEMI) myocardial infarction: Secondary | ICD-10-CM | POA: Diagnosis present

## 2018-11-23 LAB — CBC
HCT: 21.2 % — ABNORMAL LOW (ref 39.0–52.0)
Hemoglobin: 7.4 g/dL — ABNORMAL LOW (ref 13.0–17.0)
MCH: 31.5 pg (ref 26.0–34.0)
MCHC: 34.9 g/dL (ref 30.0–36.0)
MCV: 90.2 fL (ref 80.0–100.0)
Platelets: 173 10*3/uL (ref 150–400)
RBC: 2.35 MIL/uL — ABNORMAL LOW (ref 4.22–5.81)
RDW: 15.9 % — ABNORMAL HIGH (ref 11.5–15.5)
WBC: 3 10*3/uL — ABNORMAL LOW (ref 4.0–10.5)
nRBC: 0 % (ref 0.0–0.2)

## 2018-11-23 LAB — HEMOGLOBIN A1C
Hgb A1c MFr Bld: 9.5 % — ABNORMAL HIGH (ref 4.8–5.6)
Mean Plasma Glucose: 225.95 mg/dL

## 2018-11-23 LAB — HEPARIN LEVEL (UNFRACTIONATED)
Heparin Unfractionated: 0.64 IU/mL (ref 0.30–0.70)
Heparin Unfractionated: 0.69 IU/mL (ref 0.30–0.70)

## 2018-11-23 LAB — GLUCOSE, CAPILLARY
Glucose-Capillary: 172 mg/dL — ABNORMAL HIGH (ref 70–99)
Glucose-Capillary: 146 mg/dL — ABNORMAL HIGH (ref 70–99)
Glucose-Capillary: 133 mg/dL — ABNORMAL HIGH (ref 70–99)
Glucose-Capillary: 131 mg/dL — ABNORMAL HIGH (ref 70–99)
Glucose-Capillary: 71 mg/dL (ref 70–99)

## 2018-11-23 LAB — HEMOGLOBIN AND HEMATOCRIT, BLOOD
HCT: 25 % — ABNORMAL LOW (ref 39.0–52.0)
Hemoglobin: 8.9 g/dL — ABNORMAL LOW (ref 13.0–17.0)

## 2018-11-23 LAB — BASIC METABOLIC PANEL
Anion gap: 9 (ref 5–15)
BUN: 33 mg/dL — ABNORMAL HIGH (ref 8–23)
CO2: 22 mmol/L (ref 22–32)
Calcium: 8.8 mg/dL — ABNORMAL LOW (ref 8.9–10.3)
Chloride: 105 mmol/L (ref 98–111)
Creatinine, Ser: 1.72 mg/dL — ABNORMAL HIGH (ref 0.61–1.24)
GFR calc Af Amer: 46 mL/min — ABNORMAL LOW (ref >60)
GFR calc non Af Amer: 40 mL/min — ABNORMAL LOW (ref >60)
Glucose, Bld: 100 mg/dL — ABNORMAL HIGH (ref 70–99)
Potassium: 4.2 mmol/L (ref 3.5–5.1)
Sodium: 136 mmol/L (ref 135–145)

## 2018-11-23 LAB — PREPARE RBC (CROSSMATCH)

## 2018-11-23 LAB — ECHOCARDIOGRAM COMPLETE
Height: 72 in
Weight: 3587.2 oz

## 2018-11-23 LAB — LIPID PANEL
Cholesterol: 223 mg/dL — ABNORMAL HIGH (ref 0–200)
HDL: 39 mg/dL — ABNORMAL LOW (ref >40)
LDL Cholesterol: 137 mg/dL — ABNORMAL HIGH (ref 0–99)
Total CHOL/HDL Ratio: 5.7 RATIO
Triglycerides: 237 mg/dL — ABNORMAL HIGH (ref <150)
VLDL: 47 mg/dL — ABNORMAL HIGH (ref 0–40)

## 2018-11-23 LAB — OCCULT BLOOD X 1 CARD TO LAB, STOOL: Fecal Occult Bld: NEGATIVE

## 2018-11-23 LAB — HIV ANTIBODY (ROUTINE TESTING W REFLEX): HIV Screen 4th Generation wRfx: NONREACTIVE

## 2018-11-23 MED ORDER — SODIUM CHLORIDE 0.9% FLUSH
10.0000 mL | Freq: Two times a day (BID) | INTRAVENOUS | Status: DC
  Administered 2018-11-23 – 2018-11-25 (×3): 10 mL

## 2018-11-23 MED ORDER — ASPIRIN 81 MG PO CHEW
CHEWABLE_TABLET | ORAL | Status: AC
  Filled 2018-11-23: qty 1

## 2018-11-23 MED ORDER — SODIUM CHLORIDE 0.9% IV SOLUTION: Freq: Once | INTRAVENOUS | Status: DC

## 2018-11-23 MED ORDER — PERFLUTREN LIPID MICROSPHERE
1.0000 mL | INTRAVENOUS | Status: AC | PRN
  Administered 2018-11-23: 09:00:00 3 mL via INTRAVENOUS
  Filled 2018-11-23: qty 10

## 2018-11-23 MED ORDER — SODIUM CHLORIDE 0.9% FLUSH
10.0000 mL | INTRAVENOUS | Status: DC | PRN
  Administered 2018-11-25: 09:00:00 10 mL
  Filled 2018-11-23: qty 40

## 2018-11-23 NOTE — Progress Notes (Signed)
Pt's Hgb =7.4;HCT=21.2,MD on call made aware.no further orders received

## 2018-11-23 NOTE — Progress Notes (Signed)
Cardiology Progress Note  Patient ID: Tong Murk MRN: [id number] DOB: January 23, 1950 Date of Encounter: 11/23/2018  Primary Cardiologist: Lauree Chandler, MD  Subjective  No further chest pain.  Started on heparin drip yesterday and plans to treat for non-STEMI medically.  EKG slightly improved with some nonspecific ST-T changes.  His echocardiogram was being performed during my examination and shows ejection fraction 55 to 60% with no obvious wall motion abnormalities.  He was also started on aggressive medical management.  ROS:  All other ROS reviewed and negative. Pertinent positives noted in the HPI.     Inpatient Medications  Scheduled Meds: . amLODipine  5 mg Oral Daily  . aspirin EC  81 mg Oral Daily  . clopidogrel  75 mg Oral Daily  . insulin aspart  0-15 Units Subcutaneous TID WC  . insulin aspart  0-5 Units Subcutaneous QHS  . insulin aspart  15 Units Subcutaneous BID WC  . insulin glargine  50 Units Subcutaneous QHS  . isosorbide mononitrate  30 mg Oral Daily  . metoprolol tartrate  25 mg Oral BID   Continuous Infusions: . heparin 1,200 Units/hr (11/23/18 0717)   PRN Meds: acetaminophen, naphazoline-glycerin, perflutren lipid microspheres (DEFINITY) IV suspension, prochlorperazine   Vital Signs   Vitals:   11/22/18 2003 11/22/18 2234 11/23/18 0448 11/23/18 0817  BP: 126/60  (!) 116/59 128/74  Pulse: 76  68   Resp: 18 (!) 24 20   Temp: 98.1 F (36.7 C)  97.8 F (36.6 C)   TempSrc: Oral  Oral   SpO2: 100%  97%   Weight:   101.7 kg   Height:        Intake/Output Summary (Last 24 hours) at 11/23/2018 0932 Last data filed at 11/23/2018 0759 Gross per 24 hour  Intake 790.72 ml  Output 1200 ml  Net -409.28 ml   Last 3 Weights 11/23/2018 11/22/2018 11/22/2018  Weight (lbs) 224 lb 3.2 oz 223 lb 12.8 oz 238 lb  Weight (kg) 101.696 kg 101.515 kg 107.956 kg      Telemetry  Overnight telemetry shows normal sinus rhythm with heart rate in the 70-80 range,  which I personally reviewed.   ECG  The most recent ECG shows normal sinus rhythm, nonspecific ST-T changes, which I personally reviewed.   Physical Exam   Vitals:   11/22/18 2003 11/22/18 2234 11/23/18 0448 11/23/18 0817  BP: 126/60  (!) 116/59 128/74  Pulse: 76  68   Resp: 18 (!) 24 20   Temp: 98.1 F (36.7 C)  97.8 F (36.6 C)   TempSrc: Oral  Oral   SpO2: 100%  97%   Weight:   101.7 kg   Height:         Intake/Output Summary (Last 24 hours) at 11/23/2018 0932 Last data filed at 11/23/2018 0759 Gross per 24 hour  Intake 790.72 ml  Output 1200 ml  Net -409.28 ml    Last 3 Weights 11/23/2018 11/22/2018 11/22/2018  Weight (lbs) 224 lb 3.2 oz 223 lb 12.8 oz 238 lb  Weight (kg) 101.696 kg 101.515 kg 107.956 kg    Body mass index is 30.41 kg/m.  General: Well nourished, well developed, in no acute distress Head: Atraumatic, normal size  Eyes: PEERLA, EOMI  Neck: Supple, no JVD Endocrine: No thryomegaly Cardiac: Normal S1, S2; RRR; no murmurs, rubs, or gallops Lungs: Clear to auscultation bilaterally, no wheezing, rhonchi or rales  Abd: Soft, nontender, no hepatomegaly  Ext: No edema, pulses 2+ Musculoskeletal: No deformities, BUE  and BLE strength normal and equal Skin: Warm and dry, no rashes   Neuro: Alert and oriented to person, place, time, and situation, CNII-XII grossly intact, no focal deficits  Psych: Normal mood and affect   Labs  High Sensitivity Troponin:   Recent Labs  Lab 11/22/18 1126 11/22/18 1440 11/22/18 1724 11/22/18 1849  TROPONINIHS 75* 80* 73* 74*     Cardiac EnzymesNo results for input(s): TROPONINI in the last 168 hours. No results for input(s): TROPIPOC in the last 168 hours.  Chemistry Recent Labs  Lab 11/22/18 0845 11/22/18 1126 11/22/18 1724 11/23/18 0513  NA 138 136  --  136  K 4.7 4.9  --  4.2  CL 103 103  --  105  CO2 21* 22  --  22  GLUCOSE 196* 182*  --  100*  BUN 37* 36*  --  33*  CREATININE 1.98* 1.87* 1.71* 1.72*   CALCIUM 8.9 9.0  --  8.8*  PROT 7.0  --   --   --   ALBUMIN 3.9  --   --   --   AST 21  --   --   --   ALT 29  --   --   --   ALKPHOS 62  --   --   --   BILITOT 0.5  --   --   --   GFRNONAA 34* 36* 40* 40*  GFRAA 39* 42* 47* 46*  ANIONGAP 14 11  --  9    Hematology Recent Labs  Lab 11/22/18 1126 11/22/18 1724 11/23/18 0001  WBC 2.1* 2.1* 3.0*  RBC 2.59* 2.52* 2.35*  HGB 8.1* 8.0* 7.4*  HCT 24.2* 22.9* 21.2*  MCV 93.4 90.9 90.2  MCH 31.3 31.7 31.5  MCHC 33.5 34.9 34.9  RDW 16.0* 15.9* 15.9*  PLT 180 154 173   BNP Recent Labs  Lab 11/22/18 1724  BNP 54.2    DDimer No results for input(s): DDIMER in the last 168 hours.   Radiology  Dg Chest 2 View  Result Date: 11/22/2018 CLINICAL DATA:  68 year old male with a history of chest pressure EXAM: CHEST - 2 VIEW COMPARISON:  11/08/2012 FINDINGS: Cardiomediastinal silhouette within normal limits in size and contour. Low lung volumes with coarsened interstitial markings similar to the prior. No pneumothorax or pleural effusion. No confluent airspace disease. Degenerative changes of the spine with no displaced fracture. IMPRESSION: Negative for acute cardiopulmonary disease Electronically Signed   By: Corrie Mckusick D.O.   On: 11/22/2018 11:40    Cardiac Studies  TTE on my brief review while being performed in the room shows grossly normal left ventricular ejection fraction of 55 to 60% with no obvious regional wall motion abnormalities.  Final read is pending.  Patient Profile  Arib Ulch is a 68 y.o. male with history of metastatic bladder cancer with liver mets, CAD status post PCI to a diagonal branch in 2009 with subsequent subtotal occlusion of this in 2012 that was not able to be intervened, hypertension who was admitted on 11/5 with symptoms of unstable angina.   Assessment & Plan   1.  Unstable angina -He was having progressively worsening chest pain with exertion that became prolonged per his report up to 45  minutes 3 days prior to discharge.  He actually has not followed with cardiology and has been on no antianginal medications.  Cardiac enzymes are relatively flat with high-sensitivity troponin values around 70 without significant rise.  He is remained hemodynamically stable.  His echocardiogram on my review at the bedside shows preserved ejection fraction with no obvious regional wall motion abnormalities.  We are awaiting final read. -Overall he has significant CKD and I discussed his case with his primary oncologist.  This could be related to cisplatin versus underlying CKD.  Mr. Sciullo reports that he would prefer a more conservative approach in his management and would like to avoid any contrast load unless it is absolutely necessary.  His creatinine today is 1.7 which puts him in the CKD 3 range. -I think it is reasonable given that his echocardiogram looks good to treat him medically with heparin for 48 hours.  This will stop tomorrow evening around 6 PM -He was given 600 mg of Plavix yesterday we will continue 75 mg daily -He was also given aspirin 324 yesterday we will continue 81 mg daily -I think we will just plan for overall medical management of his unstable angina with heparin for 48 hours and DAPT as described above.  There is no issues with this for his cancer reported by his primary oncologist. -Anemia could also be playing a significant role here but his counts are stabilized and he will no longer have chemotherapy until his cardiovascular issues are dealt with. -I have also started him on metoprolol tartrate 25 milligrams twice daily which he is tolerating.  There are issues of bradycardia in the past but he appears to be tolerating as well. -I have also added Norvasc 5 mg and Imdur 30 mg daily. -I think we should just treat him medically for the non-STEMI and then get it moving around to determine that his angina is gone.  I will plan to see him in close follow-up post discharge and we  can continue to follow him and treat him medically.  Should he fail antianginal therapy we could consider heart catheterization however given his relatively flat cardiac enzymes and normal echocardiogram I think we can get by with medical therapy for now. -He has been unable to tolerate statins in the past and we will continue to hold these.  When I see him in clinic we will get him back on his Repatha as he was on the past.   -He also needs aggressive control of his diabetes we can consider an SGLT2 inhibitor at some point  For questions or updates, please contact [place] Please consult [email or web address] for contact info under        Signed, [place] T. Audie Box, [place]  11/23/2018 9:32 AM

## 2018-11-23 NOTE — Plan of Care (Signed)
  Problem: Education: Goal: Knowledge of General Education information will improve Description: Including pain rating scale, medication(s)/side effects and non-pharmacologic comfort measures Outcome: Progressing   Problem: Health Behavior/Discharge Planning: Goal: Ability to manage health-related needs will improve Outcome: Progressing   Problem: Clinical Measurements: Goal: Ability to maintain clinical measurements within normal limits will improve Outcome: Progressing Goal: Will remain free from infection Outcome: Progressing   Problem: Activity: Goal: Risk for activity intolerance will decrease Outcome: Progressing   Problem: Nutrition: Goal: Adequate nutrition will be maintained Outcome: Progressing   Problem: Pain Managment: Goal: General experience of comfort will improve Outcome: Progressing   Problem: Safety: Goal: Ability to remain free from injury will improve Outcome: Progressing   Elesa Hacker, RN

## 2018-11-23 NOTE — Progress Notes (Signed)
Progress Note    Chad Yu  [id number] DOB: 1950-12-19  DOA: 11/22/2018 PCP: [name], MD    Brief Narrative:     Medical records reviewed and are as summarized below:  Chad Yu is an 68 y.o. male with medical history significant of CAD status post stent, hyperlipidemia but intolerant to statins, hypertension, diabetes mellitus type 2, metastatic urothelial carcinoma with hepatic metastases currently undergoing chemotherapy presented with chest pain.  Patient states that he has been having on and off chest pain for the last 2 months.  He states that his chest pain is pressure-like, up to 5-7 out of 10 in intensity, exacerbated by exertion with no radiation and feels better with rest.  Patient denies worsening shortness of breath, palpitations, fever, cough, abdominal pain, nausea, vomiting, diarrhea or dysuria.  He was seen at the cancer center today with similar complaints and an EKG was done and subsequently he was transferred to [facility], ER for further evaluation.  Assessment/Plan:   Active Problems:   Essential hypertension   CAD, NATIVE VESSEL   Bladder cancer (HCC)   Chest pain   Unstable angina (HCC)   Unstable angina -heparin through 6 pm tomm -medical management -unable to tolerate statins -cardiology consult appreciated  Hypertension - BB, norvasc, imdur  Diabetes mellitus type 2 -SSI   chronic kidney disease stage IIIa -Monitor creatinine.  Anemia of chronic disease -Anemia is probably from chronic kidney disease -no sign of bleeding -with cardiac risk factors will transfuse 1 unit PRBCs  Metastatic urothelial cancer with hepatic metastases -Currently on chemotherapy.  Outpatient follow-up with oncology  obesity Body mass index is 30.41 kg/m.   Family Communication/Anticipated D/C date and plan/Code Status   DVT prophylaxis: heparin Code Status: Full Code.  Family Communication: Disposition Plan: heparin through 6Pm on  Saturday, home Sunday AM   Medical Consultants:    cards  Subjective:   No SOB, no current chest pain  Objective:    Vitals:   11/22/18 2003 11/22/18 2234 11/23/18 0448 11/23/18 0817  BP: 126/60  (!) 116/59 128/74  Pulse: 76  68   Resp: 18 (!) 24 20   Temp: 98.1 F (36.7 C)  97.8 F (36.6 C)   TempSrc: Oral  Oral   SpO2: 100%  97%   Weight:   101.7 kg   Height:        Intake/Output Summary (Last 24 hours) at 11/23/2018 1205 Last data filed at 11/23/2018 0759 Gross per 24 hour  Intake 790.72 ml  Output 1200 ml  Net -409.28 ml   Filed Weights   11/22/18 1117 11/22/18 1705 11/23/18 0448  Weight: 108 kg 101.5 kg 101.7 kg    Exam: In bed, NAD rrr No increased work of breathing A+Ox3  Data Reviewed:   I have personally reviewed following labs and imaging studies:  Labs: Labs show the following:   Basic Metabolic Panel: Recent Labs  Lab 11/22/18 0845 11/22/18 1126 11/22/18 1724 11/23/18 0513  NA 138 136  --  136  K 4.7 4.9  --  4.2  CL 103 103  --  105  CO2 21* 22  --  22  GLUCOSE 196* 182*  --  100*  BUN 37* 36*  --  33*  CREATININE 1.98* 1.87* 1.71* 1.72*  CALCIUM 8.9 9.0  --  8.8*   GFR Estimated Creatinine Clearance: 50.7 mL/min (A) (by C-G formula based on SCr of 1.72 mg/dL (H)). Liver Function Tests: Recent Labs  Lab  11/22/18 0845  AST 21  ALT 29  ALKPHOS 62  BILITOT 0.5  PROT 7.0  ALBUMIN 3.9   No results for input(s): LIPASE, AMYLASE in the last 168 hours. No results for input(s): AMMONIA in the last 168 hours. Coagulation profile No results for input(s): INR, PROTIME in the last 168 hours.  CBC: Recent Labs  Lab 11/22/18 0845 11/22/18 1126 11/22/18 1724 11/23/18 0001  WBC 1.9* 2.1* 2.1* 3.0*  NEUTROABS 0.9*  --   --   --   HGB 8.1* 8.1* 8.0* 7.4*  HCT 23.2* 24.2* 22.9* 21.2*  MCV 90.3 93.4 90.9 90.2  PLT 179 180 154 173   Cardiac Enzymes: No results for input(s): CKTOTAL, CKMB, CKMBINDEX, TROPONINI in the last 168  hours. BNP (last 3 results) No results for input(s): PROBNP in the last 8760 hours. CBG: Recent Labs  Lab 11/22/18 1711 11/22/18 2213 11/23/18 0745 11/23/18 1131  GLUCAP 85 172* 146* 133*   D-Dimer: No results for input(s): DDIMER in the last 72 hours. Hgb A1c: Recent Labs    11/23/18 0513  HGBA1C 9.5*   Lipid Profile: Recent Labs    11/23/18 0513  CHOL 223*  HDL 39*  LDLCALC 137*  TRIG 237*  CHOLHDL 5.7   Thyroid function studies: No results for input(s): TSH, T4TOTAL, T3FREE, THYROIDAB in the last 72 hours.  Invalid input(s): FREET3 Anemia work up: No results for input(s): VITAMINB12, FOLATE, FERRITIN, TIBC, IRON, RETICCTPCT in the last 72 hours. Sepsis Labs: Recent Labs  Lab 11/22/18 0845 11/22/18 1126 11/22/18 1724 11/23/18 0001  WBC 1.9* 2.1* 2.1* 3.0*    Microbiology Recent Results (from the past 240 hour(s))  SARS CORONAVIRUS 2 (TAT 6-24 HRS) Nasopharyngeal Nasopharyngeal Swab     Status: None   Collection Time: 11/22/18  4:46 PM   Specimen: Nasopharyngeal Swab  Result Value Ref Range Status   SARS Coronavirus 2 NEGATIVE NEGATIVE Final    Comment: (NOTE) SARS-CoV-2 target nucleic acids are NOT DETECTED. The SARS-CoV-2 RNA is generally detectable in upper and lower respiratory specimens during the acute phase of infection. Negative results do not preclude SARS-CoV-2 infection, do not rule out co-infections with other pathogens, and should not be used as the sole basis for treatment or other patient management decisions. Negative results must be combined with clinical observations, patient history, and epidemiological information. The expected result is Negative. Fact Sheet for Patients: SugarRoll.be Fact Sheet for Healthcare Providers: https://www.woods-mathews.com/ This test is not yet approved or cleared by the [place] FDA and  has been authorized for detection and/or diagnosis of SARS-CoV-2 by  FDA under an Emergency Use Authorization (EUA). This EUA will remain  in effect (meaning this test can be used) for the duration of the COVID-19 declaration under Section 56 4(b)(1) of the Act, 21 U.S.C. section 360bbb-3(b)(1), unless the authorization is terminated or revoked sooner. Performed at [facility], [place] [address], [place], [place] [address]     Procedures and diagnostic studies:  Dg Chest 2 View  Result Date: 11/22/2018 CLINICAL DATA:  68 year old male with a history of chest pressure EXAM: CHEST - 2 VIEW COMPARISON:  11/08/2012 FINDINGS: Cardiomediastinal silhouette within normal limits in size and contour. Low lung volumes with coarsened interstitial markings similar to the prior. No pneumothorax or pleural effusion. No confluent airspace disease. Degenerative changes of the spine with no displaced fracture. IMPRESSION: Negative for acute cardiopulmonary disease Electronically Signed   By: Corrie Mckusick D.O.   On: 11/22/2018 11:40  Medications:   . sodium chloride   Intravenous Once  . amLODipine  5 mg Oral Daily  . aspirin      . aspirin EC  81 mg Oral Daily  . clopidogrel  75 mg Oral Daily  . insulin aspart  0-15 Units Subcutaneous TID WC  . insulin aspart  0-5 Units Subcutaneous QHS  . insulin aspart  15 Units Subcutaneous BID WC  . insulin glargine  50 Units Subcutaneous QHS  . isosorbide mononitrate  30 mg Oral Daily  . metoprolol tartrate  25 mg Oral BID   Continuous Infusions: . heparin 1,200 Units/hr (11/23/18 0717)     LOS: 0 days   Geradine Girt  [facility]   How to contact the [facility] Attending or Consulting provider Person or covering provider during after hours [place], for this patient?  1. Check the care team in [facility] and look for a) attending/consulting TR[facility] provider listed and b) the [facility] team listed 2. Log into [email or web address] and use [place]'s universal password to access. If you do not have the password, please contact the  hospital operator. 3. Locate the [facility] provider you are looking for under [facility] and page to a number that you can be directly reached. 4. If you still have difficulty reaching the provider, please page the [facility] (Director on Call) for the Hospitalists listed on amion for assistance.  11/23/2018, 12:05 PM

## 2018-11-23 NOTE — Progress Notes (Signed)
[place] for Heparin Indication: ACS/STEMI  Allergies  Allergen Reactions  . Lisinopril Other (See Comments)    Severe cough Hair ball in throat  . Statins Hives and Other (See Comments)    Other reaction(s): Cough (ALLERGY/intolerance), Myalgias (intolerance) No cholesterol meds  . Atorvastatin Other (See Comments)    REACTION: myalgies  . Rosuvastatin Itching    Patient Measurements: Height: 6' (182.9 cm) Weight: 223 lb 12.8 oz (101.5 kg) IBW/kg (Calculated) : 77.6 Heparin Dosing Weight: 98.4 kg  Vital Signs: Temp: 98.1 F (36.7 C) (11/05 2003) Temp Source: Oral (11/05 2003) BP: 126/60 (11/05 2003) Pulse Rate: 76 (11/05 2003)  Labs: Recent Labs    11/22/18 0845  11/22/18 1126 11/22/18 1440 11/22/18 1724 11/22/18 1849 11/23/18 0001  HGB 8.1*   < > 8.1*  --  8.0*  --  7.4*  HCT 23.2*  --  24.2*  --  22.9*  --  21.2*  PLT 179  --  180  --  154  --  173  HEPARINUNFRC  --   --   --   --   --   --  0.69  CREATININE 1.98*  --  1.87*  --  1.71*  --   --   TROPONINIHS  --    < > 75* 80* 73* 74*  --    < > = values in this interval not displayed.    Estimated Creatinine Clearance: 51 mL/min (A) (by C-G formula based on SCr of 1.71 mg/dL (H)).   Assessment: 68 y.o. male with chest pain for heparin   Goal of Therapy:  Heparin level 0.3-0.7 units/ml Monitor platelets by anticoagulation protocol: Yes   Plan:  Continue Heparin at current rate  Follow-up am labs.  Phillis Knack, PharmD, BCPS 11/23/2018,12:53 AM

## 2018-11-23 NOTE — Progress Notes (Signed)
Echocardiogram 2D Echocardiogram has been performed.  [place] Deputy Jase Himmelberger 11/23/2018, 9:31 AM

## 2018-11-23 NOTE — Progress Notes (Signed)
[place] for Heparin Indication: ACS/STEMI  Allergies  Allergen Reactions  . Lisinopril Other (See Comments)    Severe cough Hair ball in throat  . Statins Hives and Other (See Comments)    Other reaction(s): Cough (ALLERGY/intolerance), Myalgias (intolerance) No cholesterol meds  . Atorvastatin Other (See Comments)    REACTION: myalgies  . Rosuvastatin Itching    Patient Measurements: Height: 6' (182.9 cm) Weight: 224 lb 3.2 oz (101.7 kg) IBW/kg (Calculated) : 77.6 Heparin Dosing Weight: 98.4 kg  Vital Signs: Temp: 97.8 F (36.6 C) (11/06 0448) Temp Source: Oral (11/06 0448) BP: 128/74 (11/06 0817) Pulse Rate: 68 (11/06 0448)  Labs: Recent Labs    11/22/18 1126 11/22/18 1440 11/22/18 1724 11/22/18 1849 11/23/18 0001 11/23/18 0513  HGB 8.1*  --  8.0*  --  7.4*  --   HCT 24.2*  --  22.9*  --  21.2*  --   PLT 180  --  154  --  173  --   HEPARINUNFRC  --   --   --   --  0.69 0.64  CREATININE 1.87*  --  1.71*  --   --  1.72*  TROPONINIHS 75* 80* 73* 74*  --   --     Estimated Creatinine Clearance: 50.7 mL/min (A) (by C-G formula based on SCr of 1.72 mg/dL (H)).   Assessment: 68 yr old male with medical history below presented to ED with chest pain and pharmacy dosing heparin. Plans note for 48hrs (anticipate stopping 11/7 ~ 6pm) -heparin level at goal -hg= 7.4 (has been undergoing chemotherapy)  Goal of Therapy:  Heparin level 0.3-0.7 units/ml Monitor platelets by anticoagulation protocol: Yes   Plan:  -No heparin changes needed -Daily heparin level and CBC  Hildred Laser, PharmD Clinical Pharmacist **Pharmacist phone directory can now be found on [email or web address] ([email or web address]).  Listed under [place].

## 2018-11-24 LAB — CBC
HCT: 23.3 % — ABNORMAL LOW (ref 39.0–52.0)
Hemoglobin: 8.2 g/dL — ABNORMAL LOW (ref 13.0–17.0)
MCH: 31.4 pg (ref 26.0–34.0)
MCHC: 35.2 g/dL (ref 30.0–36.0)
MCV: 89.3 fL (ref 80.0–100.0)
Platelets: 121 10*3/uL — ABNORMAL LOW (ref 150–400)
RBC: 2.61 MIL/uL — ABNORMAL LOW (ref 4.22–5.81)
RDW: 15.5 % (ref 11.5–15.5)
WBC: 4.4 10*3/uL (ref 4.0–10.5)
nRBC: 0 % (ref 0.0–0.2)

## 2018-11-24 LAB — BPAM RBC
Blood Product Expiration Date: 202012102359
ISSUE DATE / TIME: 202011061616
Unit Type and Rh: 5100

## 2018-11-24 LAB — GLUCOSE, CAPILLARY
Glucose-Capillary: 203 mg/dL — ABNORMAL HIGH (ref 70–99)
Glucose-Capillary: 79 mg/dL (ref 70–99)
Glucose-Capillary: 208 mg/dL — ABNORMAL HIGH (ref 70–99)
Glucose-Capillary: 231 mg/dL — ABNORMAL HIGH (ref 70–99)

## 2018-11-24 LAB — TYPE AND SCREEN
ABO/RH(D): O POS
Antibody Screen: NEGATIVE
Unit division: 0

## 2018-11-24 LAB — HEPARIN LEVEL (UNFRACTIONATED): Heparin Unfractionated: 0.34 IU/mL (ref 0.30–0.70)

## 2018-11-24 NOTE — Progress Notes (Signed)
Progress Note    Chad Yu  [id number] DOB: [birth year]  DOA: 11/22/2018 PCP: [name], MD    Brief Narrative:     Medical records reviewed and are as summarized below:  Chad Yu is an 68 y.o. male with medical history significant of CAD status post stent, hyperlipidemia but intolerant to statins, hypertension, diabetes mellitus type 2, metastatic urothelial carcinoma with hepatic metastases currently undergoing chemotherapy presented with chest pain.  Patient states that he has been having on and off chest pain for the last 2 months.  He states that his chest pain is pressure-like, up to 5-7 out of 10 in intensity, exacerbated by exertion with no radiation and feels better with rest.  Patient denies worsening shortness of breath, palpitations, fever, cough, abdominal pain, nausea, vomiting, diarrhea or dysuria.  He was seen at the cancer center today with similar complaints and an EKG was done and subsequently he was transferred to [facility], ER for further evaluation.  Assessment/Plan:   Active Problems:   Essential hypertension   CAD, NATIVE VESSEL   Bladder cancer (HCC)   Chest pain   Unstable angina (HCC)   Unstable angina -heparin through 6 pm tomm -medical management -unable to tolerate statins -cardiology consult appreciated  Hypertension - BB, norvasc, imdur  Diabetes mellitus type 2 -SSI   chronic kidney disease stage IIIa -Monitor creatinine.  Anemia of chronic disease -Anemia is probably from chronic kidney disease -no sign of bleeding -s/p 1 unit PRBCs  Metastatic urothelial cancer with hepatic metastases -Currently on chemotherapy.  Outpatient follow-up with oncology  obesity Body mass index is 30.5 kg/m.   Family Communication/Anticipated D/C date and plan/Code Status   DVT prophylaxis: heparin Code Status: Full Code.  Family Communication: Disposition Plan: heparin through 6Pm on Saturday, home Sunday AM after  ambulation if no chest pain/SOB   Medical Consultants:    cards  Subjective:   No overnight events  Objective:    Vitals:   11/23/18 2149 11/24/18 0538 11/24/18 0539 11/24/18 1052  BP: (!) 150/84 136/74  (!) 143/86  Pulse: 69 63  74  Resp:  20    Temp:  98.2 F (36.8 C)    TempSrc:  Oral    SpO2:  98%    Weight:   102 kg   Height:        Intake/Output Summary (Last 24 hours) at 11/24/2018 1245 Last data filed at 11/24/2018 1135 Gross per 24 hour  Intake 1066.69 ml  Output 2875 ml  Net -1808.31 ml   Filed Weights   11/22/18 1705 11/23/18 0448 11/24/18 0539  Weight: 101.5 kg 101.7 kg 102 kg    Exam: In bed, NAD Talking on the phone rrr  Data Reviewed:   I have personally reviewed following labs and imaging studies:  Labs: Labs show the following:   Basic Metabolic Panel: Recent Labs  Lab 11/22/18 0845 11/22/18 1126 11/22/18 1724 11/23/18 0513  NA 138 136  --  136  K 4.7 4.9  --  4.2  CL 103 103  --  105  CO2 21* 22  --  22  GLUCOSE 196* 182*  --  100*  BUN 37* 36*  --  33*  CREATININE 1.98* 1.87* 1.71* 1.72*  CALCIUM 8.9 9.0  --  8.8*   GFR Estimated Creatinine Clearance: 50.8 mL/min (A) (by C-G formula based on SCr of 1.72 mg/dL (H)). Liver Function Tests: Recent Labs  Lab 11/22/18 0845  AST 21  ALT  29  ALKPHOS 62  BILITOT 0.5  PROT 7.0  ALBUMIN 3.9   No results for input(s): LIPASE, AMYLASE in the last 168 hours. No results for input(s): AMMONIA in the last 168 hours. Coagulation profile No results for input(s): INR, PROTIME in the last 168 hours.  CBC: Recent Labs  Lab 11/22/18 0845 11/22/18 1126 11/22/18 1724 11/23/18 0001 11/23/18 2149 11/24/18 0331  WBC 1.9* 2.1* 2.1* 3.0*  --  4.4  NEUTROABS 0.9*  --   --   --   --   --   HGB 8.1* 8.1* 8.0* 7.4* 8.9* 8.2*  HCT 23.2* 24.2* 22.9* 21.2* 25.0* 23.3*  MCV 90.3 93.4 90.9 90.2  --  89.3  PLT 179 180 154 173  --  121*   Cardiac Enzymes: No results for input(s): CKTOTAL,  CKMB, CKMBINDEX, TROPONINI in the last 168 hours. BNP (last 3 results) No results for input(s): PROBNP in the last 8760 hours. CBG: Recent Labs  Lab 11/23/18 1131 11/23/18 1610 11/23/18 2123 11/24/18 0733 11/24/18 1134  GLUCAP 133* 131* 71 203* 79   D-Dimer: No results for input(s): DDIMER in the last 72 hours. Hgb A1c: Recent Labs    11/23/18 0513  HGBA1C 9.5*   Lipid Profile: Recent Labs    11/23/18 0513  CHOL 223*  HDL 39*  LDLCALC 137*  TRIG 237*  CHOLHDL 5.7   Thyroid function studies: No results for input(s): TSH, T4TOTAL, T3FREE, THYROIDAB in the last 72 hours.  Invalid input(s): FREET3 Anemia work up: No results for input(s): VITAMINB12, FOLATE, FERRITIN, TIBC, IRON, RETICCTPCT in the last 72 hours. Sepsis Labs: Recent Labs  Lab 11/22/18 1126 11/22/18 1724 11/23/18 0001 11/24/18 0331  WBC 2.1* 2.1* 3.0* 4.4    Microbiology Recent Results (from the past 240 hour(s))  SARS CORONAVIRUS 2 (TAT 6-24 HRS) Nasopharyngeal Nasopharyngeal Swab     Status: None   Collection Time: 11/22/18  4:46 PM   Specimen: Nasopharyngeal Swab  Result Value Ref Range Status   SARS Coronavirus 2 NEGATIVE NEGATIVE Final    Comment: (NOTE) SARS-CoV-2 target nucleic acids are NOT DETECTED. The SARS-CoV-2 RNA is generally detectable in upper and lower respiratory specimens during the acute phase of infection. Negative results do not preclude SARS-CoV-2 infection, do not rule out co-infections with other pathogens, and should not be used as the sole basis for treatment or other patient management decisions. Negative results must be combined with clinical observations, patient history, and epidemiological information. The expected result is Negative. Fact Sheet for Patients: SugarRoll.be Fact Sheet for Healthcare Providers: https://www.woods-mathews.com/ This test is not yet approved or cleared by the [place] FDA and  has been  authorized for detection and/or diagnosis of SARS-CoV-2 by FDA under an Emergency Use Authorization (EUA). This EUA will remain  in effect (meaning this test can be used) for the duration of the COVID-19 declaration under Section 56 4(b)(1) of the Act, 21 U.S.C. section 360bbb-3(b)(1), unless the authorization is terminated or revoked sooner. Performed at [facility], [place] [address] [place], [place] [address]     Procedures and diagnostic studies:  No results found.  Medications:   . sodium chloride   Intravenous Once  . amLODipine  5 mg Oral Daily  . aspirin EC  81 mg Oral Daily  . clopidogrel  75 mg Oral Daily  . insulin aspart  0-15 Units Subcutaneous TID WC  . insulin aspart  0-5 Units Subcutaneous QHS  . insulin aspart  15 Units Subcutaneous  BID WC  . insulin glargine  50 Units Subcutaneous QHS  . isosorbide mononitrate  30 mg Oral Daily  . metoprolol tartrate  25 mg Oral BID  . sodium chloride flush  10-40 mL Intracatheter Q12H   Continuous Infusions: . heparin 1,200 Units/hr (11/24/18 0616)     LOS: 1 day   Geradine Girt  [facility]   How to contact the [facility] Attending or Consulting provider [place] or covering provider during after hours [place], for this patient?  1. Check the care team in [facility] and look for a) attending/consulting [facility] provider listed and b) the [facility] team listed 2. Log into [email or web address] and use [place] universal password to access. If you do not have the password, please contact the hospital operator. 3. Locate the [facility] provider you are looking for under [facility] and page to a number that you can be directly reached. 4. If you still have difficulty reaching the provider, please page the [facility] (Director on Call) for the Hospitalists listed on amion for assistance.  11/24/2018, 12:45 PM

## 2018-11-24 NOTE — Progress Notes (Addendum)
[place] for Heparin Indication: ACS/STEMI  Allergies  Allergen Reactions  . Lisinopril Other (See Comments)    Severe cough Hair ball in throat  . Statins Hives and Other (See Comments)    Other reaction(s): Cough (ALLERGY/intolerance), Myalgias (intolerance) No cholesterol meds  . Atorvastatin Other (See Comments)    REACTION: myalgies  . Rosuvastatin Itching    Patient Measurements: Height: 6' (182.9 cm) Weight: 224 lb 13.9 oz (102 kg) IBW/kg (Calculated) : 77.6 Heparin Dosing Weight: 98.4 kg  Vital Signs: Temp: 98.2 F (36.8 C) (11/07 0538) Temp Source: Oral (11/07 0538) BP: 136/74 (11/07 0538) Pulse Rate: 63 (11/07 0538)  Labs: Recent Labs    11/22/18 1126 11/22/18 1440 11/22/18 1724 11/22/18 1849 11/23/18 0001 11/23/18 0513 11/23/18 2149 11/24/18 0331  HGB 8.1*  --  8.0*  --  7.4*  --  8.9* 8.2*  HCT 24.2*  --  22.9*  --  21.2*  --  25.0* 23.3*  PLT 180  --  154  --  173  --   --  121*  HEPARINUNFRC  --   --   --   --  0.69 0.64  --  0.34  CREATININE 1.87*  --  1.71*  --   --  1.72*  --   --   TROPONINIHS 75* 80* 73* 74*  --   --   --   --     Estimated Creatinine Clearance: 50.8 mL/min (A) (by C-G formula based on SCr of 1.72 mg/dL (H)).   Assessment: 68 yr old male presented with chest pain. PMH includes CAD status post stent, HLD, HTN, DM2, metastatic urothelial carcinoma with hepatic metastases. No AC PTA. Pharmacy consulted for heparin dosing.   Per cards, Heparin for 48hrs (anticipate stopping today at 6pm) -HL therapeutic at 0.34 -Hgb = 8.2, Plts 121(has been undergoing chemotherapy)  Goal of Therapy:  Heparin level 0.3-0.7 units/ml Monitor platelets by anticoagulation protocol: Yes   Plan:  Continue heparin 1200 units/hr - Stop at [place] today, 11/7 - Nurse aware Discontinue daily HL and CBC Monitor for s/sx of bleeding   Lorel Monaco, PharmD PGY1 Ambulatory Care Resident [employer] #  [phone]

## 2018-11-24 NOTE — Progress Notes (Signed)
Pt had an uneventful day. Student nurse made frequent verbal contacts to check on the pt. Pt stated he did not need anything, at each contact. Spent much of the day in bed. Will continue with plan of care.

## 2018-11-24 NOTE — Progress Notes (Signed)
Progress Note  Patient Name: Chad Yu Date of Encounter: 11/24/2018  Primary Cardiologist: Lauree Chandler, MD   Subjective   No chest pain, no SOB  Inpatient Medications    Scheduled Meds: . sodium chloride   Intravenous Once  . amLODipine  5 mg Oral Daily  . aspirin EC  81 mg Oral Daily  . clopidogrel  75 mg Oral Daily  . insulin aspart  0-15 Units Subcutaneous TID WC  . insulin aspart  0-5 Units Subcutaneous QHS  . insulin aspart  15 Units Subcutaneous BID WC  . insulin glargine  50 Units Subcutaneous QHS  . isosorbide mononitrate  30 mg Oral Daily  . metoprolol tartrate  25 mg Oral BID  . sodium chloride flush  10-40 mL Intracatheter Q12H   Continuous Infusions: . heparin 1,200 Units/hr (11/24/18 0616)   PRN Meds: acetaminophen, naphazoline-glycerin, prochlorperazine, sodium chloride flush   Vital Signs    Vitals:   11/23/18 2000 11/23/18 2149 11/24/18 0538 11/24/18 0539  BP: 134/71 (!) 150/84 136/74   Pulse: 75 69 63   Resp: 18  20   Temp: 98.8 F (37.1 C)  98.2 F (36.8 C)   TempSrc: Oral  Oral   SpO2: 99%  98%   Weight:    102 kg  Height:        Intake/Output Summary (Last 24 hours) at 11/24/2018 0821 Last data filed at 11/24/2018 0800 Gross per 24 hour  Intake 660 ml  Output 2750 ml  Net -2090 ml   Last 3 Weights 11/24/2018 11/23/2018 11/22/2018  Weight (lbs) 224 lb 13.9 oz 224 lb 3.2 oz 223 lb 12.8 oz  Weight (kg) 102 kg 101.696 kg 101.515 kg      Telemetry    SR - Personally Reviewed  ECG    n/a - Personally Reviewed  Physical Exam   GEN: No acute distress.   Neck: No JVD Cardiac: RRR, no murmurs, rubs, or gallops.  Respiratory: Clear to auscultation bilaterally. GI: Soft, nontender, non-distended  MS: No edema; No deformity. Neuro:  Nonfocal  Psych: Normal affect   Labs    High Sensitivity Troponin:   Recent Labs  Lab 11/22/18 1126 11/22/18 1440 11/22/18 1724 11/22/18 1849  TROPONINIHS 75* 80* 73* 74*       Chemistry Recent Labs  Lab 11/22/18 0845 11/22/18 1126 11/22/18 1724 11/23/18 0513  NA 138 136  --  136  K 4.7 4.9  --  4.2  CL 103 103  --  105  CO2 21* 22  --  22  GLUCOSE 196* 182*  --  100*  BUN 37* 36*  --  33*  CREATININE 1.98* 1.87* 1.71* 1.72*  CALCIUM 8.9 9.0  --  8.8*  PROT 7.0  --   --   --   ALBUMIN 3.9  --   --   --   AST 21  --   --   --   ALT 29  --   --   --   ALKPHOS 62  --   --   --   BILITOT 0.5  --   --   --   GFRNONAA 34* 36* 40* 40*  GFRAA 39* 42* 47* 46*  ANIONGAP 14 11  --  9     Hematology Recent Labs  Lab 11/22/18 1724 11/23/18 0001 11/23/18 2149 11/24/18 0331  WBC 2.1* 3.0*  --  4.4  RBC 2.52* 2.35*  --  2.61*  HGB 8.0* 7.4* 8.9* 8.2*  HCT 22.9* 21.2* 25.0* 23.3*  MCV 90.9 90.2  --  89.3  MCH 31.7 31.5  --  31.4  MCHC 34.9 34.9  --  35.2  RDW 15.9* 15.9*  --  15.5  PLT 154 173  --  121*    BNP Recent Labs  Lab 11/22/18 1724  BNP 54.2     DDimer No results for input(s): DDIMER in the last 168 hours.   Radiology    Dg Chest 2 View  Result Date: 11/22/2018 CLINICAL DATA:  68 year old male with a history of chest pressure EXAM: CHEST - 2 VIEW COMPARISON:  11/08/2012 FINDINGS: Cardiomediastinal silhouette within normal limits in size and contour. Low lung volumes with coarsened interstitial markings similar to the prior. No pneumothorax or pleural effusion. No confluent airspace disease. Degenerative changes of the spine with no displaced fracture. IMPRESSION: Negative for acute cardiopulmonary disease Electronically Signed   By: Corrie Mckusick D.O.   On: 11/22/2018 11:40    Cardiac Studies    Patient Profile     Chad Yu is a 68 y.o. male with history of metastatic bladder cancer with liver mets, CAD status post PCI to a diagonal Marcianne Ozbun in 2009 with subsequent subtotal occlusion of this in 2012 that was not able to be intervened, hypertension who was admitted on 11/5 with symptoms of unstable angina.   Assessment & Plan     1 Unstable angina - management affected by CKD - Echo 60-65%, no WMAs. Peak trop only 80 - from prior cardiology rounding note plan is for 48 hrs of heparin which completes tonight at 6pm - he is on asa, plavix, lopressor. Also on imdur 30 and norvasc as additional antiangional therapy.  - intolerant to statins, Dr Marisue Ivan to resume repatha for patient as outpatient. No ACE/ARB due to renal dysfunction  - monitor overnight, follow symptoms. Will complete 48 hrs of heparin tonight - possible discharge tomorrow.    2. CKD 3 - Some improvement in Cr since admission    For questions or updates, please contact [place] Please consult [email or web address] for contact info under        Signed, Carlyle Dolly, MD  11/24/2018, 8:21 AM

## 2018-11-25 ENCOUNTER — Telehealth: Payer: Self-pay | Admitting: Physician Assistant

## 2018-11-25 DIAGNOSIS — E1169 Type 2 diabetes mellitus with other specified complication: Secondary | ICD-10-CM

## 2018-11-25 DIAGNOSIS — E785 Hyperlipidemia, unspecified: Secondary | ICD-10-CM

## 2018-11-25 DIAGNOSIS — I214 Non-ST elevation (NSTEMI) myocardial infarction: Principal | ICD-10-CM

## 2018-11-25 LAB — GLUCOSE, CAPILLARY
Glucose-Capillary: 112 mg/dL — ABNORMAL HIGH (ref 70–99)
Glucose-Capillary: 167 mg/dL — ABNORMAL HIGH (ref 70–99)

## 2018-11-25 MED ORDER — ISOSORBIDE MONONITRATE ER 30 MG PO TB24: 30.0000 mg | ORAL_TABLET | Freq: Every day | ORAL | 0 refills | Status: DC

## 2018-11-25 MED ORDER — METOPROLOL TARTRATE 25 MG PO TABS: 25.0000 mg | ORAL_TABLET | Freq: Two times a day (BID) | ORAL | 0 refills | Status: DC

## 2018-11-25 MED ORDER — AMLODIPINE BESYLATE 5 MG PO TABS: 5.0000 mg | ORAL_TABLET | Freq: Every day | ORAL | 0 refills | Status: DC

## 2018-11-25 MED ORDER — CLOPIDOGREL BISULFATE 75 MG PO TABS: 75.0000 mg | ORAL_TABLET | Freq: Every day | ORAL | 0 refills | Status: DC

## 2018-11-25 MED ORDER — ASPIRIN 81 MG PO TBEC: 81.0000 mg | DELAYED_RELEASE_TABLET | Freq: Every day | ORAL | Status: AC

## 2018-11-25 NOTE — Progress Notes (Addendum)
Patient had an uneventful day. BG 112 this morning; 15 units novolog insulin given - charge nurse acknowledged dose. Will continue to monitor.

## 2018-11-25 NOTE — Discharge Summary (Signed)
Physician Discharge Summary  Chad Yu [id number] DOB: [birth year] DOA: 11/22/2018  PCP: [name], MD  Admit date: 11/22/2018 Discharge date: 11/25/2018  Admitted From: home Discharge disposition: home   Recommendations for Outpatient Follow-Up:   1. Follow cbc, bmp   Discharge Diagnosis:   Active Problems:   Essential hypertension   CAD, NATIVE VESSEL   Bladder cancer ([place])   Chest pain   Unstable angina [facility])    Discharge Condition: Improved.  Diet recommendation: Low sodium, heart healthy  Wound care: None.  Code status: Full.   History of Present Illness:   Chad Yu is a 68 y.o. male with medical history significant of CAD status post stent, hyperlipidemia but intolerant to statins, hypertension, diabetes mellitus type 2, metastatic urothelial carcinoma with hepatic metastases currently undergoing chemotherapy presented with chest pain.  Patient states that he has been having on and off chest pain for the last 2 months.  He states that his chest pain is pressure-like, up to 5-7 out of 10 in intensity, exacerbated by exertion with no radiation and feels better with rest.  Patient denies worsening shortness of breath, palpitations, fever, cough, abdominal pain, nausea, vomiting, diarrhea or dysuria.  He was seen at the cancer center today with similar complaints and an EKG was done and subsequently he was transferred to [facility], ER for further evaluation.   Hospital Course by Problem:   Unstable angina -s/p 48 hours of heparin -medical management -unable to tolerate statins- outpatient cardiology follow up -cardiology consult appreciated:asa, plavix, lopressor. Also on imdur 30 and norvasc as additional antiangional therapy.  - intolerant to statins, Dr Marisue Ivan to resume repatha for patient as outpatient. No ACE/ARB due to renal dysfunction  Hypertension -BB, norvasc, imdur  Diabetes mellitus type 2 -stop metformin due to CR being  elevated   chronic kidney disease stage IIIa -Monitor creatinine outpatient  Anemia of chronic disease -Anemia is probably from chronic kidney disease -no sign of bleeding -s/p 1 unit PRBCs -outpatient follow up  Metastatic urothelial cancer with hepatic metastases -Currently on chemotherapy. Outpatient follow-up with oncology  obesity Body mass index is 30.5 kg/m.    Medical Consultants:    cards  Discharge Exam:   Vitals:   11/25/18 0425 11/25/18 0903  BP: 139/78 (!) 166/88  Pulse: 62   Resp: 20 18  Temp: 97.9 F (36.6 C) 98.3 F (36.8 C)  SpO2: 99% 98%   Vitals:   11/24/18 1445 11/24/18 1951 11/25/18 0425 11/25/18 0903  BP: 132/80 108/62 139/78 (!) 166/88  Pulse: 65 78 62   Resp:  18 20 18   Temp: 98.1 F (36.7 C) 98.1 F (36.7 C) 97.9 F (36.6 C) 98.3 F (36.8 C)  TempSrc: Oral Oral Oral Oral  SpO2: 98% 97% 99% 98%  Weight:   102.1 kg   Height:        General exam: Appears calm and comfortable.    The results of significant diagnostics from this hospitalization (including imaging, microbiology, ancillary and laboratory) are listed below for reference.     Procedures and Diagnostic Studies:   Dg Chest 2 View  Result Date: 11/22/2018 CLINICAL DATA:  68 year old male with a history of chest pressure EXAM: CHEST - 2 VIEW COMPARISON:  11/08/2012 FINDINGS: Cardiomediastinal silhouette within normal limits in size and contour. Low lung volumes with coarsened interstitial markings similar to the prior. No pneumothorax or pleural effusion. No confluent airspace disease. Degenerative changes of the spine with no displaced fracture.  IMPRESSION: Negative for acute cardiopulmonary disease Electronically Signed   By: Corrie Mckusick D.O.   On: 11/22/2018 11:40     Labs:   Basic Metabolic Panel: Recent Labs  Lab 11/22/18 0845 11/22/18 1126 11/22/18 1724 11/23/18 0513  NA 138 136  --  136  K 4.7 4.9  --  4.2  CL 103 103  --  105  CO2 21* 22  --  22   GLUCOSE 196* 182*  --  100*  BUN 37* 36*  --  33*  CREATININE 1.98* 1.87* 1.71* 1.72*  CALCIUM 8.9 9.0  --  8.8*   GFR Estimated Creatinine Clearance: 50.8 mL/min (A) (by C-G formula based on SCr of 1.72 mg/dL (H)). Liver Function Tests: Recent Labs  Lab 11/22/18 0845  AST 21  ALT 29  ALKPHOS 62  BILITOT 0.5  PROT 7.0  ALBUMIN 3.9   No results for input(s): LIPASE, AMYLASE in the last 168 hours. No results for input(s): AMMONIA in the last 168 hours. Coagulation profile No results for input(s): INR, PROTIME in the last 168 hours.  CBC: Recent Labs  Lab 11/22/18 0845 11/22/18 1126 11/22/18 1724 11/23/18 0001 11/23/18 2149 11/24/18 0331  WBC 1.9* 2.1* 2.1* 3.0*  --  4.4  NEUTROABS 0.9*  --   --   --   --   --   HGB 8.1* 8.1* 8.0* 7.4* 8.9* 8.2*  HCT 23.2* 24.2* 22.9* 21.2* 25.0* 23.3*  MCV 90.3 93.4 90.9 90.2  --  89.3  PLT 179 180 154 173  --  121*   Cardiac Enzymes: No results for input(s): CKTOTAL, CKMB, CKMBINDEX, TROPONINI in the last 168 hours. BNP: Invalid input(s): POCBNP CBG: Recent Labs  Lab 11/24/18 0733 11/24/18 1134 11/24/18 1609 11/24/18 2105 11/25/18 0803  GLUCAP 203* 79 208* 231* 112*   D-Dimer No results for input(s): DDIMER in the last 72 hours. Hgb A1c Recent Labs    11/23/18 0513  HGBA1C 9.5*   Lipid Profile Recent Labs    11/23/18 0513  CHOL 223*  HDL 39*  LDLCALC 137*  TRIG 237*  CHOLHDL 5.7   Thyroid function studies No results for input(s): TSH, T4TOTAL, T3FREE, THYROIDAB in the last 72 hours.  Invalid input(s): FREET3 Anemia work up No results for input(s): VITAMINB12, FOLATE, FERRITIN, TIBC, IRON, RETICCTPCT in the last 72 hours. Microbiology Recent Results (from the past 240 hour(s))  SARS CORONAVIRUS 2 (TAT 6-24 HRS) Nasopharyngeal Nasopharyngeal Swab     Status: None   Collection Time: 11/22/18  4:46 PM   Specimen: Nasopharyngeal Swab  Result Value Ref Range Status   SARS Coronavirus 2 NEGATIVE NEGATIVE  Final    Comment: (NOTE) SARS-CoV-2 target nucleic acids are NOT DETECTED. The SARS-CoV-2 RNA is generally detectable in upper and lower respiratory specimens during the acute phase of infection. Negative results do not preclude SARS-CoV-2 infection, do not rule out co-infections with other pathogens, and should not be used as the sole basis for treatment or other patient management decisions. Negative results must be combined with clinical observations, patient history, and epidemiological information. The expected result is Negative. Fact Sheet for Patients: SugarRoll.be Fact Sheet for Healthcare Providers: https://www.woods-mathews.com/ This test is not yet approved or cleared by the [place] FDA and  has been authorized for detection and/or diagnosis of SARS-CoV-2 by FDA under an Emergency Use Authorization (EUA). This EUA will remain  in effect (meaning this test can be used) for the duration of the COVID-19 declaration under Section 56  4(b)(1) of the Act, 21 U.S.C. section 360bbb-3(b)(1), unless the authorization is terminated or revoked sooner. Performed at [facility], [place] [address], [place], [place] [address]      Discharge Instructions:   Discharge Instructions    AMB Referral to Phase II Cardiac Rehab   Complete by: As directed    Diagnosis: NSTEMI   After initial evaluation and assessments completed: Virtual Based Care may be provided alone or in conjunction with Phase 2 Cardiac Rehab based on patient barriers.: Yes   Diet - low sodium heart healthy   Complete by: As directed    Diet Carb Modified   Complete by: As directed    Increase activity slowly   Complete by: As directed      Allergies as of 11/25/2018      Reactions   Lisinopril Other (See Comments)   Severe cough Hair ball in throat   Statins Hives, Other (See Comments)   Other reaction(s): Cough (ALLERGY/intolerance), Myalgias (intolerance) No  cholesterol meds   Atorvastatin Other (See Comments)   REACTION: myalgies   Rosuvastatin Itching      Medication List    STOP taking these medications   BC Fast Pain Relief 650-195-33.3 MG Pack Generic drug: Aspirin-Salicylamide-Caffeine   metFORMIN 500 MG 24 hr tablet Commonly known as: GLUCOPHAGE-XR   olmesartan 40 MG tablet Commonly known as: BENICAR     TAKE these medications   amLODipine 5 MG tablet Commonly known as: NORVASC Take 1 tablet (5 mg total) by mouth daily. Start taking on: November 26, 2018   clopidogrel 75 MG tablet Commonly known as: PLAVIX Take 1 tablet (75 mg total) by mouth daily. Start taking on: November 26, 2018   insulin glargine 100 UNIT/ML injection Commonly known as: LANTUS Inject 50 Units into the skin at bedtime.   insulin lispro 100 UNIT/ML KwikPen Commonly known as: HUMALOG Inject 15 Units into the skin 2 (two) times daily.   isosorbide mononitrate 30 MG 24 hr tablet Commonly known as: IMDUR Take 1 tablet (30 mg total) by mouth daily. Start taking on: November 26, 2018   Jardiance 25 MG Tabs tablet Generic drug: empagliflozin Take 25 mg by mouth daily.   metoprolol tartrate 25 MG tablet Commonly known as: LOPRESSOR Take 1 tablet (25 mg total) by mouth 2 (two) times daily.   prochlorperazine 10 MG tablet Commonly known as: COMPAZINE TAKE 1 TABLET BY MOUTH EVERY 6 HOURS AS NEEDED FOR NAUSEA AND VOMITING What changed: See the new instructions.   tetrahydrozoline 0.05 % ophthalmic solution Place 1 drop into both eyes at bedtime as needed (eye irritation).   THERAWORX GLOVE + FOAM EX Apply 1 application topically 2 (two) times daily as needed (leg cramps).   Trulicity 1.5 0000000 Sopn Generic drug: Dulaglutide Inject 1.5 mg into the skin every Sunday.   Voltaren 1 % Gel Generic drug: diclofenac sodium Apply 1 application topically at bedtime as needed (pain.).      Follow-up Information    [name], MD Follow up in 1  week(s).   Specialty: Internal Medicine Contact information: [place] [address] [phone]        Burnell Blanks, MD .   Specialty: Cardiology Contact information: [place] [address] [address] [phone]            Time coordinating discharge: 35 min  Signed:  Geradine Girt DO  [facility] 11/25/2018, 9:57 AM

## 2018-11-25 NOTE — Progress Notes (Signed)
Discharge teaching has been done. Patient understood discharge instructions.

## 2018-11-25 NOTE — Progress Notes (Signed)
   Dr. Harl Bowie asked to assist with arranging f/u appt. Did not see care team availability to scheduled with L Gerhardt 12/10/18, appt placed on AVS. I confirmed with nurse it had not yet been printed. Also notified IM need to place ASA on AVS list as well.   Did the patient have an acute coronary syndrome (MI, NSTEMI, STEMI, etc) this admission?:  Yes                               AHA/ACC Clinical Performance & Quality Measures: 1. Aspirin prescribed? - Yes 2. ADP Receptor Inhibitor (Plavix/Clopidogrel, Brilinta/Ticagrelor or Effient/Prasugrel) prescribed (includes medically managed patients)? - Yes 3. Beta Blocker prescribed? - Yes 4. High Intensity Statin (Lipitor 40-80mg  or Crestor 20-40mg ) prescribed? - No - intolerant of statin, plan to start Repatha as OP 5. EF assessed during THIS hospitalization? - Yes 6. For EF <40%, was ACEI/ARB prescribed? - Not Applicable (EF >/= [id number]) 7. For EF <40%, Aldosterone Antagonist (Spironolactone or Eplerenone) prescribed? - Not Applicable (EF >/= [id number]) 8. Cardiac Rehab Phase II ordered (Included Medically managed Patients)? - Yes - I placed order per DC order set. CR phase I not available in house on Sundays.  Dayna Dunn PA-C

## 2018-11-25 NOTE — Plan of Care (Signed)

## 2018-11-25 NOTE — Discharge Instructions (Signed)
Angina ° °Angina is very bad discomfort or pain in the chest, neck, arm, jaw, or back. The discomfort is caused by a lack of blood in the middle layer of the heart wall (myocardium). °What are the causes? °This condition is caused by a buildup of fat and cholesterol (plaque) in your arteries (atherosclerosis). This buildup narrows the arteries and makes it hard for blood to flow. °What increases the risk? °You are more likely to develop this condition if: °· You have high levels of cholesterol in your blood. °· You have high blood pressure (hypertension). °· You have diabetes. °· You have a family history of heart disease. °· You are not active, or you do not exercise enough. °· You feel sad (depressed). °· You have been treated with high energy rays (radiation) on the left side of your chest. °Other risk factors are: °· Using tobacco. °· Being very overweight (obese). °· Eating a diet high in unhealthy fats (saturated fats). °· Having stress, or being exposed to things that cause stress. °· Using drugs, such as cocaine. °Women have a greater risk for angina if: °· They are older than 55. °· They have stopped having their period (are in postmenopause). °What are the signs or symptoms? °Common symptoms of this condition in both men and women may include: °· Chest pain, which may: °? Feel like a crushing or squeezing in the chest. °? Feel like a tightness, pressure, fullness, or heaviness in the chest. °? Last for more than a few minutes at a time. °? Stop and come back (recur) after a few minutes. °· Pain in the neck, arm, jaw, or back. °· Heartburn or upset stomach (indigestion) for no reason. °· Being short of breath. °· Feeling sick to your stomach (nauseous). °· Sudden cold sweats. °Women and people with diabetes may have other symptoms that are not usual, such as feeling: °· Tired (fatigue). °· Worried or nervous (anxious) for no reason. °· Weak for no reason. °· Dizzy or passing out (fainting). °How is this  treated? °This condition may be treated with: °· Medicines. These are given to: °? Prevent blood clots. °? Prevent heart attack. °? Relax blood vessels and improve blood flow to the heart (nitrates). °? Reduce blood pressure. °? Improve the pumping action of the heart. °? Reduce fat and cholesterol in the blood. °· A procedure to widen a narrowed or blocked artery in the heart (angioplasty). °· Surgery to allow blood to go around a blocked artery (coronary artery bypass surgery). °Follow these instructions at home: °Medicines °· Take over-the-counter and prescription medicines only as told by your doctor. °· Do not take these medicines unless your doctor says that you can: °? NSAIDs. These include: °§ Ibuprofen. °§ Naproxen. °? Vitamin supplements that have vitamin A, vitamin E, or both. °? Hormone therapy that contains estrogen with or without progestin. °Eating and drinking ° °· Eat a heart-healthy diet that includes: °? Lots of fresh fruits and vegetables. °? Whole grains. °? Low-fat (lean) protein. °? Low-fat dairy products. °· Follow instructions from your doctor about what you cannot eat or drink. °Activity °· Follow an exercise program that your doctor tells you. °· Talk with your doctor about joining a program to help improve the health of your heart (cardiac rehab). °· When you feel tired, take a break. Plan breaks if you know you are going to feel tired. °Lifestyle ° °· Do not use any products that contain nicotine or tobacco. This includes cigarettes, e-cigarettes, and   chewing tobacco. If you need help quitting, ask your doctor. °· If your doctor says you can drink alcohol: °? Limit how much you use to: °§ 0-1 drink a day for women who are not pregnant. °§ 0-2 drinks a day for men. °? Be aware of how much alcohol is in your drink. In the [place] one drink equals: °§ One 12 oz bottle of beer (355 mL). °§ One 5 oz glass of wine (148 mL). °§ One 1½ oz glass of hard liquor (44 mL). °General instructions °· Stay  at a healthy weight. If your doctor tells you to do so, work with him or her to lose weight. °· Learn to deal with stress. If you need help, ask your doctor. °· Keep your vaccines up to date. Get a flu shot every year. °· Talk with your doctor if you feel sad. Take a screening test to see if you are at risk for depression. °· Work with your doctor to manage any other health problems that you have. These may include diabetes or high blood pressure. °· Keep all follow-up visits as told by your doctor. This is important. °Get help right away if: °· You have pain in your chest, neck, arm, jaw, or back, and the pain: °? Lasts more than a few minutes. °? Comes back. °? Does not get better after you take medicine under your tongue (sublingual nitroglycerin). °? Keeps getting worse. °? Comes more often. °· You have any of these problems for no reason: °? Sweating a lot. °? Heartburn or upset stomach. °? Shortness of breath. °? Trouble breathing. °? Feeling sick to your stomach. °? Throwing up (vomiting). °? Feeling more tired than normal. °? Feeling nervous or worrying more than normal. °? Weakness. °· You are suddenly dizzy or light-headed. °· You pass out. °These symptoms may be an emergency. Do not wait to see if the symptoms will go away. Get medical help right away. Call your local emergency services (911 in the [place] Do not drive yourself to the hospital. °Summary °· Angina is very bad discomfort or pain in the chest, neck, arm, neck, or back. °· Symptoms include chest pain, heartburn or upset stomach for no reason, and shortness of breath. °· Women or people with diabetes may have symptoms that are not usual, such as feeling nervous or worried for no reason, weak for no reason, or tired. °· Take all medicines only as told by your doctor. °· You should eat a heart-healthy diet and follow an exercise program. °This information is not intended to replace advice given to you by your health care provider. Make sure you  discuss any questions you have with your health care provider. °Document Released: 06/22/2007 Document Revised: 08/21/2017 Document Reviewed: 08/21/2017 °Elsevier Patient Education © 2020 Elsevier Inc. ° °

## 2018-11-25 NOTE — Telephone Encounter (Signed)
FYI - patient being discharged, will need TOC phone call. Appt is scheduled 11/23 with Truitt Merle. Thanks. Ciela Mahajan PA-C

## 2018-11-25 NOTE — Progress Notes (Signed)
Progress Note  Patient Name: Chad Yu Date of Encounter: 11/25/2018  Primary Cardiologist: Lauree Chandler, MD   Subjective   No complaints  Inpatient Medications    Scheduled Meds: . sodium chloride   Intravenous Once  . amLODipine  5 mg Oral Daily  . aspirin EC  81 mg Oral Daily  . clopidogrel  75 mg Oral Daily  . insulin aspart  0-15 Units Subcutaneous TID WC  . insulin aspart  0-5 Units Subcutaneous QHS  . insulin aspart  15 Units Subcutaneous BID WC  . insulin glargine  50 Units Subcutaneous QHS  . isosorbide mononitrate  30 mg Oral Daily  . metoprolol tartrate  25 mg Oral BID  . sodium chloride flush  10-40 mL Intracatheter Q12H   Continuous Infusions:  PRN Meds: acetaminophen, naphazoline-glycerin, prochlorperazine, sodium chloride flush   Vital Signs    Vitals:   11/24/18 1445 11/24/18 1951 11/25/18 0425 11/25/18 0903  BP: 132/80 108/62 139/78 (!) 166/88  Pulse: 65 78 62   Resp:  18 20 18   Temp: 98.1 F (36.7 C) 98.1 F (36.7 C) 97.9 F (36.6 C) 98.3 F (36.8 C)  TempSrc: Oral Oral Oral Oral  SpO2: 98% 97% 99% 98%  Weight:   102.1 kg   Height:        Intake/Output Summary (Last 24 hours) at 11/25/2018 0940 Last data filed at 11/25/2018 [id number] Gross per 24 hour  Intake 444.31 ml  Output 2525 ml  Net -2080.69 ml   Last 3 Weights 11/25/2018 11/24/2018 11/23/2018  Weight (lbs) 225 lb 1.6 oz 224 lb 13.9 oz 224 lb 3.2 oz  Weight (kg) 102.105 kg 102 kg 101.696 kg      Telemetry    SR - Personally Reviewed  ECG    n/a - Personally Reviewed  Physical Exam   GEN: No acute distress.   Neck: No JVD Cardiac: RRR, no murmurs, rubs, or gallops.  Respiratory: Clear to auscultation bilaterally. GI: Soft, nontender, non-distended  MS: No edema; No deformity. Neuro:  Nonfocal  Psych: Normal affect   Labs    High Sensitivity Troponin:   Recent Labs  Lab 11/22/18 1126 11/22/18 1440 11/22/18 1724 11/22/18 1849  TROPONINIHS 75* 80* 73*  74*      Chemistry Recent Labs  Lab 11/22/18 0845 11/22/18 1126 11/22/18 1724 11/23/18 0513  NA 138 136  --  136  K 4.7 4.9  --  4.2  CL 103 103  --  105  CO2 21* 22  --  22  GLUCOSE 196* 182*  --  100*  BUN 37* 36*  --  33*  CREATININE 1.98* 1.87* 1.71* 1.72*  CALCIUM 8.9 9.0  --  8.8*  PROT 7.0  --   --   --   ALBUMIN 3.9  --   --   --   AST 21  --   --   --   ALT 29  --   --   --   ALKPHOS 62  --   --   --   BILITOT 0.5  --   --   --   GFRNONAA 34* 36* 40* 40*  GFRAA 39* 42* 47* 46*  ANIONGAP 14 11  --  9     Hematology Recent Labs  Lab 11/22/18 1724 11/23/18 0001 11/23/18 2149 11/24/18 0331  WBC 2.1* 3.0*  --  4.4  RBC 2.52* 2.35*  --  2.61*  HGB 8.0* 7.4* 8.9* 8.2*  HCT 22.9* 21.2*  25.0* 23.3*  MCV 90.9 90.2  --  89.3  MCH 31.7 31.5  --  31.4  MCHC 34.9 34.9  --  35.2  RDW 15.9* 15.9*  --  15.5  PLT 154 173  --  121*    BNP Recent Labs  Lab 11/22/18 1724  BNP 54.2     DDimer No results for input(s): DDIMER in the last 168 hours.   Radiology    No results found.  Cardiac Studies   Patient Profile     Chad Yu a 68 y.o.malewith history of metastatic bladder cancer with liver mets, CAD status post PCI to a diagonal branch in 2009 with subsequent subtotal occlusion of this in 2012 that was not able to be intervened, hypertension who was admitted on 11/5 with symptoms of unstable angina.   Assessment & Plan    1 NSTEMI - management affected by CKD - Echo 60-65%, no WMAs. Peak trop only 80 - from prior cardiology rounding note plan is for 48 hrs of heparin which completed last night - he is on asa, plavix, lopressor. Also on imdur 30 and norvasc as additional antiangional therapy.  - intolerant to statins, Dr Marisue Ivan to resume repatha for patient as outpatient. No ACE/ARB due to renal dysfunction  - monitor overnight, follow symptoms. Will complete 48 hrs of heparin tonight - possible discharge tomorrow.    2. CKD 3 - Some  improvement in Cr since admission  3. Metastatic urothelial cancer with hepatic metastases -Currently on chemotherapy. Outpatient follow-up with oncology   We will refer to cardiac rehab. [place] for discharge today, we will arrange outpatient f/u in 2- 3 weeks.    For questions or updates, please contact [place] Please consult [email or web address] for contact info under        Signed, Carlyle Dolly, MD  11/25/2018, 9:40 AM

## 2018-11-26 MED ORDER — REPATHA SURECLICK 140 MG/ML ~~LOC~~ SOAJ: 1.0000 "pen " | SUBCUTANEOUS | 11 refills | Status: AC

## 2018-11-26 NOTE — Telephone Encounter (Signed)
Prior auth approved through 05/26/19, rx sent to pharmacy. Monthly copay $0 per pharmacy. Called pt to make him aware - he states he already reached his annual deductible which is why pricing is lower than normal. He will call with any concerns before his next appt in clinic.

## 2018-11-26 NOTE — Telephone Encounter (Signed)
Called pt, he states he fell into the donut hole and his Repatha became too expensive. He has not been on Repatha in about 2 years when his PCP was previously managing. He previously tried rosuvastatin 5mg  daily but experienced hives, and had muscle pain with Lipitor.  LDL 137, TG 237 at time of hospitalization for unstable angina on 11/22/18. Goal LDL < 70.  Will submit new prior authorization for Repatha - pt ok with ~$50 copay. He sees Lori for [facility] visit in 2 weeks. Added on lipid visit afterwards to discuss adding Vascepa as well, will discuss [place] to help with copays.

## 2018-11-26 NOTE — Telephone Encounter (Signed)
Patient contacted regarding discharge from [facility] on 11/25/2018.  Patient understands to follow up with provider Truitt Merle, NP on 12/10/2018 at 10:00 at [facility] in [place], [place] [address]. Patient understands discharge instructions? Yes Patient understands medications and regiment? Yes  The pt is requesting to go back on Repatha. He states that his PCP was prescribing it for him but have been unable to get his ins to cover it. He states that he would rather his cardiologist handle his [place]. He is aware that I am forwarding this note to Pharmacist and that they will reach out to him to discuss.   Patient understands to bring all medications to this visit? Yes

## 2018-11-27 ENCOUNTER — Telehealth (HOSPITAL_COMMUNITY): Payer: Self-pay | Admitting: *Deleted

## 2018-11-27 NOTE — Telephone Encounter (Signed)
Received referral for this pt to participate in cardiac rehab with the diagnosis of NSTEMI.  Pt discharged over the weekend and was not seen by Cardiac Rehab phase I.  Phase II referral placed by PA per protocol. Noted in pt history he is receiving chemo for bladder cancer with mets to the liver.  Called pt to see if he was interested in participating in CR particular with his chemo appt and neutropenic precautions.  Pt said that right now he is not able to participate and should not be in crowds of people.  Pt is to continue his chemo through at least this month.  Pt will follow up with the cardiologist on 11/23.  Pt requested a call back in December to see how he is doing.  Pt also remarked that he notices when he is walking he sometimes will have chest pressure that goes away with rest.  Pt is aware that he has a blockage that is medically treated.  Advised pt that should his pressure change, intensity or does not go away with rest to please call 911.  Pt verbally agreed his understanding. Cherre Huger, BSN Cardiac and Training and development officer

## 2018-11-28 ENCOUNTER — Encounter: Payer: Self-pay | Admitting: Cardiovascular Disease

## 2018-11-28 NOTE — Telephone Encounter (Signed)
Called patient. He states that they only had 1 of his 4 Rx filled. They are working on the rest and he is waiting for them to call him. Confirmed pharmacy with patient. Appears there is no issue. Patient will call if he has any problems.

## 2018-11-28 NOTE — Telephone Encounter (Signed)
error 

## 2018-11-28 NOTE — Telephone Encounter (Signed)
  Patient is calling because he went to the pharmacy to get his Repatha and they did not have a prescription for it. Please follow up with patient.

## 2018-11-30 ENCOUNTER — Encounter (HOSPITAL_COMMUNITY)
Admission: RE | Admit: 2018-11-30 | Discharge: 2018-11-30 | Disposition: A | Discharge status: Home / Self Care | Payer: Medicare Other | Source: Ambulatory Visit | Attending: Oncology | Admitting: Oncology

## 2018-11-30 ENCOUNTER — Ambulatory Visit (HOSPITAL_COMMUNITY): Admission: RE | Admit: 2018-11-30 | Payer: Medicare Other | Source: Ambulatory Visit

## 2018-11-30 ENCOUNTER — Other Ambulatory Visit: Payer: Self-pay

## 2018-11-30 DIAGNOSIS — I700 Atherosclerosis of aorta: Secondary | ICD-10-CM | POA: Insufficient documentation

## 2018-11-30 DIAGNOSIS — I2510 Atherosclerotic heart disease of native coronary artery without angina pectoris: Secondary | ICD-10-CM | POA: Insufficient documentation

## 2018-11-30 DIAGNOSIS — C678 Malignant neoplasm of overlapping sites of bladder: Secondary | ICD-10-CM | POA: Diagnosis present

## 2018-11-30 DIAGNOSIS — N200 Calculus of kidney: Secondary | ICD-10-CM | POA: Insufficient documentation

## 2018-11-30 DIAGNOSIS — K769 Liver disease, unspecified: Secondary | ICD-10-CM | POA: Diagnosis not present

## 2018-11-30 LAB — GLUCOSE, CAPILLARY: Glucose-Capillary: 136 mg/dL — ABNORMAL HIGH (ref 70–99)

## 2018-11-30 MED ORDER — FLUDEOXYGLUCOSE F - 18 (FDG) INJECTION
11.2400 | Freq: Once | INTRAVENOUS | Status: AC
  Administered 2018-11-30: 11.24 via INTRAVENOUS

## 2018-12-03 ENCOUNTER — Other Ambulatory Visit: Payer: Self-pay | Admitting: Cardiovascular Disease

## 2018-12-03 MED ORDER — METOPROLOL TARTRATE 25 MG PO TABS: 25.0000 mg | ORAL_TABLET | Freq: Two times a day (BID) | ORAL | 2 refills | Status: DC

## 2018-12-03 MED ORDER — ISOSORBIDE MONONITRATE ER 30 MG PO TB24: 30.0000 mg | ORAL_TABLET | Freq: Every day | ORAL | 2 refills | Status: DC

## 2018-12-03 MED ORDER — CLOPIDOGREL BISULFATE 75 MG PO TABS: 75.0000 mg | ORAL_TABLET | Freq: Every day | ORAL | 2 refills | Status: DC

## 2018-12-03 NOTE — Telephone Encounter (Signed)
Pt's medications were sent to pt's pharmacy as requested. Confirmation received.  

## 2018-12-05 ENCOUNTER — Inpatient Hospital Stay: Payer: Medicare Other

## 2018-12-05 ENCOUNTER — Other Ambulatory Visit: Payer: Self-pay

## 2018-12-05 ENCOUNTER — Inpatient Hospital Stay: Payer: Medicare Other | Admitting: Oncology

## 2018-12-05 VITALS — BP 127/85 | HR 80 | Temp 97.8°F | Resp 18 | Ht 72.0 in | Wt 238.6 lb

## 2018-12-05 DIAGNOSIS — I25119 Atherosclerotic heart disease of native coronary artery with unspecified angina pectoris: Secondary | ICD-10-CM | POA: Diagnosis not present

## 2018-12-05 DIAGNOSIS — K769 Liver disease, unspecified: Secondary | ICD-10-CM | POA: Diagnosis not present

## 2018-12-05 DIAGNOSIS — C679 Malignant neoplasm of bladder, unspecified: Secondary | ICD-10-CM

## 2018-12-05 DIAGNOSIS — I119 Hypertensive heart disease without heart failure: Secondary | ICD-10-CM | POA: Diagnosis not present

## 2018-12-05 DIAGNOSIS — Z5111 Encounter for antineoplastic chemotherapy: Secondary | ICD-10-CM | POA: Diagnosis present

## 2018-12-05 LAB — CBC WITH DIFFERENTIAL (CANCER CENTER ONLY)
Abs Immature Granulocytes: 0.05 10*3/uL (ref 0.00–0.07)
Basophils Absolute: 0 10*3/uL (ref 0.0–0.1)
Basophils Relative: 1 %
Eosinophils Absolute: 0.1 10*3/uL (ref 0.0–0.5)
Eosinophils Relative: 3 %
HCT: 28 % — ABNORMAL LOW (ref 39.0–52.0)
Hemoglobin: 9.5 g/dL — ABNORMAL LOW (ref 13.0–17.0)
Immature Granulocytes: 1 %
Lymphocytes Relative: 19 %
Lymphs Abs: 1 10*3/uL (ref 0.7–4.0)
MCH: 32.3 pg (ref 26.0–34.0)
MCHC: 33.9 g/dL (ref 30.0–36.0)
MCV: 95.2 fL (ref 80.0–100.0)
Monocytes Absolute: 0.8 10*3/uL (ref 0.1–1.0)
Monocytes Relative: 15 %
Neutro Abs: 3 10*3/uL (ref 1.7–7.7)
Neutrophils Relative %: 61 %
Platelet Count: 211 10*3/uL (ref 150–400)
RBC: 2.94 MIL/uL — ABNORMAL LOW (ref 4.22–5.81)
RDW: 18.8 % — ABNORMAL HIGH (ref 11.5–15.5)
WBC Count: 5 10*3/uL (ref 4.0–10.5)
nRBC: 0 % (ref 0.0–0.2)

## 2018-12-05 LAB — CMP (CANCER CENTER ONLY)
ALT: 27 U/L (ref 0–44)
AST: 21 U/L (ref 15–41)
Albumin: 3.8 g/dL (ref 3.5–5.0)
Alkaline Phosphatase: 71 U/L (ref 38–126)
Anion gap: 16 — ABNORMAL HIGH (ref 5–15)
BUN: 32 mg/dL — ABNORMAL HIGH (ref 8–23)
CO2: 20 mmol/L — ABNORMAL LOW (ref 22–32)
Calcium: 9.1 mg/dL (ref 8.9–10.3)
Chloride: 104 mmol/L (ref 98–111)
Creatinine: 1.81 mg/dL — ABNORMAL HIGH (ref 0.61–1.24)
GFR, Est AFR Am: 44 mL/min — ABNORMAL LOW (ref >60)
GFR, Estimated: 38 mL/min — ABNORMAL LOW (ref >60)
Glucose, Bld: 137 mg/dL — ABNORMAL HIGH (ref 70–99)
Potassium: 4.7 mmol/L (ref 3.5–5.1)
Sodium: 140 mmol/L (ref 135–145)
Total Bilirubin: 0.4 mg/dL (ref 0.3–1.2)
Total Protein: 7 g/dL (ref 6.5–8.1)

## 2018-12-05 NOTE — Progress Notes (Signed)
Hematology and Oncology Follow Up Visit  Chad Yu [id number] [birth year] 68 y.o. 12/05/2018 3:05 PM Chad Yu, MDSun, Chad Hawthorne, MD   Principle Diagnosis: 68 year old man with stage IV bladder cancer diagnosed in August 2020.  He was found to have a hepatic lesion is suspicious for metastatic disease.  Prior Therapy:   He is status post TURBT completed in August 2020 which showed high-grade urothelial carcinoma with muscle invasion.  Chemotherapy utilizing cisplatin and gemcitabine started on 10/04/2018.  He received gemcitabine 1000 mg per metered square, cisplatin 70 mg per metered square on day 1 and gemcitabine 1000 mg per metered square on day 8 out of 21-day cycle.  He completed 3 cycles of therapy.  Chemotherapy will be switched to carboplatin based regimen because of renal insufficiency.   Current therapy: Under evaluation to start cycle 4 of therapy utilizing carboplatin and gemcitabine on 12/06/2018.    Interim History: Mr. Chad Yu returns today for a follow-up visit.  Since the last visit, he has had tolerated chemotherapy reasonably well but the last cycle to be held years of symptoms of chest pain and angina.  He was hospitalized between November 5 and November 8 for unstable angina and was medically treated.  Since his discharge, he is feeling better at this time and does not report any chest pain at rest.  He denies any shortness of breath or difficulty breathing.  Denies any nausea or vomiting.  He does report some shortness of breath on exertion.   He denied any alteration mental status, neuropathy, confusion or dizziness.  Denies any headaches or lethargy.  Denies any night sweats, weight loss or changes in appetite.  Denied orthopnea, dyspnea on exertion or chest discomfort.  Denies shortness of breath, difficulty breathing hemoptysis or cough.  Denies any abdominal distention, nausea, early satiety or dyspepsia.  Denies any hematuria, frequency, dysuria or nocturia.  Denies  any skin irritation, dryness or rash.  Denies any ecchymosis or petechiae.  Denies any lymphadenopathy or clotting.  Denies any heat or cold intolerance.  Denies any anxiety or depression.  Remaining review of system is negative.       Medications: Unchanged on review. Current Outpatient Medications  Medication Sig Dispense Refill  . amLODipine (NORVASC) 5 MG tablet Take 1 tablet (5 mg total) by mouth daily. 30 tablet 0  . aspirin EC 81 MG EC tablet Take 1 tablet (81 mg total) by mouth daily.    . clopidogrel (PLAVIX) 75 MG tablet Take 1 tablet (75 mg total) by mouth daily. 90 tablet 2  . diclofenac sodium (VOLTAREN) 1 % GEL Apply 1 application topically at bedtime as needed (pain.).    Marland Kitchen Dulaglutide (TRULICITY) 1.5 0000000 SOPN Inject 1.5 mg into the skin every Sunday.     . empagliflozin (JARDIANCE) 25 MG TABS tablet Take 25 mg by mouth daily.    . Evolocumab (REPATHA SURECLICK) [id number] MG/ML SOAJ Inject 1 pen into the skin every 14 (fourteen) days. 2 pen 11  . Homeopathic Products (THERAWORX GLOVE + FOAM EX) Apply 1 application topically 2 (two) times daily as needed (leg cramps).    . insulin glargine (LANTUS) 100 UNIT/ML injection Inject 50 Units into the skin at bedtime.     . insulin lispro (HUMALOG) 100 UNIT/ML KwikPen Inject 15 Units into the skin 2 (two) times daily.    . isosorbide mononitrate (IMDUR) 30 MG 24 hr tablet Take 1 tablet (30 mg total) by mouth daily. 90 tablet 2  . metoprolol tartrate (LOPRESSOR) 25  MG tablet Take 1 tablet (25 mg total) by mouth 2 (two) times daily. 180 tablet 2  . prochlorperazine (COMPAZINE) 10 MG tablet TAKE 1 TABLET BY MOUTH EVERY 6 HOURS AS NEEDED FOR NAUSEA AND VOMITING (Patient taking differently: Take 10 mg by mouth every 6 (six) hours as needed for nausea or vomiting. ) 30 tablet 0  . tetrahydrozoline 0.05 % ophthalmic solution Place 1 drop into both eyes at bedtime as needed (eye irritation).     No current facility-administered medications for  this visit.      Allergies:  Allergies  Allergen Reactions  . Lisinopril Other (See Comments)    Severe cough Hair ball in throat  . Statins Hives and Other (See Comments)    Other reaction(s): Cough (ALLERGY/intolerance), Myalgias (intolerance) No cholesterol meds  . Atorvastatin Other (See Comments)    REACTION: myalgies  . Rosuvastatin Itching    Past Medical History, Surgical history, Social history, and Family History without any changes on review.   Physical Exam: Blood pressure 127/85, pulse 80, temperature 97.8 F (36.6 C), temperature source Temporal, resp. rate 18, height 6' (1.829 m), weight 238 lb 9.6 oz (108.2 kg), SpO2 99 %.    ECOG: 1    General appearance: Comfortable appearing without any discomfort Head: Normocephalic without any trauma Oropharynx: Mucous membranes are moist and pink without any thrush or ulcers. Eyes: Pupils are equal and round reactive to light. Lymph nodes: No cervical, supraclavicular, inguinal or axillary lymphadenopathy.   Heart:regular rate and rhythm.  S1 and S2 without leg edema. Lung: Clear without any rhonchi or wheezes.  No dullness to percussion. Abdomin: Soft, nontender, nondistended with good bowel sounds.  No hepatosplenomegaly. Musculoskeletal: No joint deformity or effusion.  Full range of motion noted. Neurological: No deficits noted on motor, sensory and deep tendon reflex exam. Skin: No petechial rash or dryness.  Appeared moist.  .     Lab Results: Lab Results  Component Value Date   WBC 4.4 11/24/2018   HGB 8.2 (L) 11/24/2018   HCT 23.3 (L) 11/24/2018   MCV 89.3 11/24/2018   PLT 121 (L) 11/24/2018     Chemistry      Component Value Date/Time   NA 136 11/23/2018 0513   K 4.2 11/23/2018 0513   CL 105 11/23/2018 0513   CO2 22 11/23/2018 0513   BUN 33 (H) 11/23/2018 0513   CREATININE 1.72 (H) 11/23/2018 0513   CREATININE 1.98 (H) 11/22/2018 0845      Component Value Date/Time   CALCIUM 8.8 (L)  11/23/2018 0513   ALKPHOS 62 11/22/2018 0845   AST 21 11/22/2018 0845   ALT 29 11/22/2018 0845   BILITOT 0.5 11/22/2018 0845     1. Exam detail is diminished due to diffuse physiologic skeletal muscle uptake. This may be secondary to tracer uptake in the nonfasting state. 2. There are 2 suspicious lesions within the liver, concerning for metastatic disease. The largest is in segment 4 B, is hypermetabolic and measures 4.2 cm. Smaller lesion within segment 8 measures 2.5 cm and is also concerning for metastasis. 3. Aortic Atherosclerosis (ICD10-I70.0). Coronary artery calcifications. 4. Kidney stones.  Impression and Plan:  68 year old with:  1.    Bladder cancer diagnosed in August 2020.  He was found to have high-grade urothelial carcinoma with a suspicious hepatic lesion.   He completed 3 cycles of therapy utilizing gemcitabine and cisplatin without any major complications.  PET CT scan obtained on November 30, 2018 was personally reviewed  and showed no evidence of widely metastatic disease but does have 2 separate suspicious lesions in the liver.  The differential diagnosis was discussed and certainly metastatic disease is the predominant possibility although benign etiologies cannot be completely ruled out.  After discussion today I have recommended continuing systemic chemotherapy with switching to carboplatin for better tolerance and given his renal insufficiency.  After completing 3 more cycles of therapy we will repeat imaging studies and determine the best course of action.  Local therapy for his bladder cancer will only be done if he has disease control with resolution of his any metastatic disease.   2. IV access: Peripheral veins continues to be preferred at this time.  Port-A-Cath will be considered if IV access becomes an issue.  3. Antiemetics: Antiemetics are available to him at this time.  4. Renal function surveillance:His creatinine clearance was to 40  cc/min we will monitor closely on platinum agents.   5.  Neutropenia: His total white cell count is adequate with absolute neutrophil count is adequate as well.  6. Goals of care: His disease is likely incurable although aggressive measures are warranted given his performance status is excellent.  7. Follow-up:  On 12/06/2018 to start cycle 4 of therapy and in 3 weeks for the start of cycle 5.  25  minutes was spent with the patient face-to-face today.  More than 50% of time was dedicated to reviewing his imaging studies, disease status update as well as coordinating future plan of care.  Zola Button, MD 11/18/20203:05 PM

## 2018-12-06 ENCOUNTER — Inpatient Hospital Stay: Payer: Medicare Other

## 2018-12-06 ENCOUNTER — Other Ambulatory Visit: Payer: Self-pay

## 2018-12-06 ENCOUNTER — Telehealth: Payer: Self-pay | Admitting: Oncology

## 2018-12-06 VITALS — BP 140/69 | HR 75 | Temp 98.2°F | Resp 18

## 2018-12-06 DIAGNOSIS — C678 Malignant neoplasm of overlapping sites of bladder: Secondary | ICD-10-CM

## 2018-12-06 DIAGNOSIS — Z5111 Encounter for antineoplastic chemotherapy: Secondary | ICD-10-CM | POA: Diagnosis not present

## 2018-12-06 MED ORDER — SODIUM CHLORIDE 0.9 % IV SOLN
Freq: Once | INTRAVENOUS | Status: AC
  Administered 2018-12-06: 10:00:00 via INTRAVENOUS
  Filled 2018-12-06: qty 250

## 2018-12-06 MED ORDER — PALONOSETRON HCL INJECTION 0.25 MG/5ML
0.2500 mg | Freq: Once | INTRAVENOUS | Status: AC
  Administered 2018-12-06: 0.25 mg via INTRAVENOUS

## 2018-12-06 MED ORDER — SODIUM CHLORIDE 0.9 % IV SOLN
400.0000 mg | Freq: Once | INTRAVENOUS | Status: AC
  Administered 2018-12-06: 400 mg via INTRAVENOUS
  Filled 2018-12-06: qty 40

## 2018-12-06 MED ORDER — SODIUM CHLORIDE 0.9 % IV SOLN: 428.0000 mg | Freq: Once | INTRAVENOUS | Status: DC

## 2018-12-06 MED ORDER — SODIUM CHLORIDE 0.9 % IV SOLN
Freq: Once | INTRAVENOUS | Status: AC
  Administered 2018-12-06: 10:00:00 via INTRAVENOUS
  Filled 2018-12-06: qty 5

## 2018-12-06 MED ORDER — SODIUM CHLORIDE 0.9 % IV SOLN
800.0000 mg/m2 | Freq: Once | INTRAVENOUS | Status: AC
  Administered 2018-12-06: 1900 mg via INTRAVENOUS
  Filled 2018-12-06: qty 49.97

## 2018-12-06 MED ORDER — PALONOSETRON HCL INJECTION 0.25 MG/5ML
INTRAVENOUS | Status: AC
  Filled 2018-12-06: qty 5

## 2018-12-06 NOTE — Telephone Encounter (Signed)
Was able to schedule all appts per 11/18 los.  Patient no longer getting cisplatin, moving forward he will be getting [place] + gemzar per MD.

## 2018-12-06 NOTE — Patient Instructions (Signed)
[facility] Discharge Instructions for Patients Receiving Chemotherapy  Today you received the following chemotherapy agents :  Gemcitabine, Cisplatin.  To help prevent nausea and vomiting after your treatment, we encourage you to take your nausea medication as prescribed.  DO DRINK LOTS OF FLUIDS AS TOLERATED.   DO NOT EAT GREASY NOR SPICY FOODS. TAKE  COMPAZINE 10 MG EVERY 6 HOURS AS NEEDED FOR NAUSEA.   THIS MEDICATION CAN CAUSE DROWSINESS -  DO NOT DRIVE AFTER TAKING THIS MEDICATION.    If you develop nausea and vomiting that is not controlled by your nausea medication, call the clinic.   BELOW ARE SYMPTOMS THAT SHOULD BE REPORTED IMMEDIATELY:  *FEVER GREATER THAN 100.5 F  *CHILLS WITH OR WITHOUT FEVER  NAUSEA AND VOMITING THAT IS NOT CONTROLLED WITH YOUR NAUSEA MEDICATION  *UNUSUAL SHORTNESS OF BREATH  *UNUSUAL BRUISING OR BLEEDING  TENDERNESS IN MOUTH AND THROAT WITH OR WITHOUT PRESENCE OF ULCERS  *URINARY PROBLEMS  *BOWEL PROBLEMS  UNUSUAL RASH Items with * indicate a potential emergency and should be followed up as soon as possible.  Feel free to call the clinic should you have any questions or concerns. The clinic phone number is [phone].  Please show the [facility] at check-in to the Emergency Department and triage nurse.  Coronavirus (COVID-19) Are you at risk?  Are you at risk for the Coronavirus (COVID-19)?  To be considered HIGH RISK for Coronavirus (COVID-19), you have to meet the following criteria:  . Traveled to [place], [place], [place], [place] or [place]; or in the [place] to [place], [place], [place], or [place]; and have fever, cough, and shortness of breath within the last 2 weeks of travel OR . Been in close contact with a person diagnosed with COVID-19 within the last 2 weeks and have fever, cough, and shortness of breath . IF YOU DO NOT MEET THESE CRITERIA, YOU ARE CONSIDERED LOW RISK FOR  COVID-19.  What to do if you are HIGH RISK for COVID-19?  Marland Kitchen If you are having a medical emergency, call 911. . Seek medical care right away. Before you go to a doctor's office, urgent care or emergency department, call ahead and tell them about your recent travel, contact with someone diagnosed with COVID-19, and your symptoms. You should receive instructions from your physician's office regarding next steps of care.  . When you arrive at healthcare provider, tell the healthcare staff immediately you have returned from visiting [place], [place], [place], [place] or [place]; or traveled in the [place] to [place], [place], [place], or [place]; in the last two weeks or you have been in close contact with a person diagnosed with COVID-19 in the last 2 weeks.   . Tell the health care staff about your symptoms: fever, cough and shortness of breath. . After you have been seen by a medical provider, you will be either: o Tested for (COVID-19) and discharged home on quarantine except to seek medical care if symptoms worsen, and asked to  - Stay home and avoid contact with others until you get your results (4-5 days)  - Avoid travel on public transportation if possible (such as bus, train, or airplane) or o Sent to the Emergency Department by EMS for evaluation, COVID-19 testing, and possible admission depending on your condition and test results.  What to do if you are LOW RISK for COVID-19?  Reduce your risk of any infection by using the same  precautions used for avoiding the common cold or flu:  Marland Kitchen Wash your hands often with soap and warm water for at least 20 seconds.  If soap and water are not readily available, use an alcohol-based hand sanitizer with at least 60% alcohol.  . If coughing or sneezing, cover your mouth and nose by coughing or sneezing into the elbow areas of your shirt or coat, into a tissue or into your sleeve (not your hands). . Avoid shaking hands with others and consider  head nods or verbal greetings only. . Avoid touching your eyes, nose, or mouth with unwashed hands.  . Avoid close contact with people who are sick. . Avoid places or events with large numbers of people in one location, like concerts or sporting events. . Carefully consider travel plans you have or are making. . If you are planning any travel outside or inside the [place], visit the CDC's Travelers' Health webpage for the latest health notices. . If you have some symptoms but not all symptoms, continue to monitor at home and seek medical attention if your symptoms worsen. . If you are having a medical emergency, call 911.   [place] / e-Visit: [email or web address]         [facility]: [facility]: [id number]                   [facility]: [phone]

## 2018-12-06 NOTE — Progress Notes (Signed)
Okay to treat today with Creat. 1.81, per Dr. Shadad.  

## 2018-12-06 NOTE — Telephone Encounter (Signed)
Scheduled appt per 11/18 los.  Waiting to hear back from the MD about the f/u visit.

## 2018-12-07 NOTE — Progress Notes (Addendum)
CARDIOLOGY OFFICE NOTE  Date:  12/10/2018    Bobbie Stack Date of Birth: 1950-11-06 Medical Record [id number]  PCP:  [name], MD  Cardiologist:  [facility]  Chief Complaint  Patient presents with  . Hospitalization Follow-up    Seen for Dr. [name]    History of Present Illness: Hailey Graham is a 68 y.o. male who presents today for what was to be a TOC visit - however, he is out of the 14 day window - discharged on 11/25/2018.  Seen for Dr. [name].   He has a known history of CAD status post stent, hyperlipidemia but intolerant to statins, hypertension, diabetes mellitus type 2, and metastatic urothelial carcinoma with hepatic metastases currently undergoing chemotherapy.   Last seen in our office back in July by Lyda Jester PA.   Presented earlier this month with chest pain - worse with exertion - was at the cancer center with chest pain and EKG was done - transferred to [facility] for further evaluation.   He was treated with heparin - medical management was felt to be the best option for him due to his kidney impairment. He was seen by pharmacy for lipid management. No ACE/ARB due to CKD. Metformin stopped due to CKD. He was transfused one unit of blood for his anemia - also felt to be due to CKD. No cardiac cath.   The patient does not have symptoms concerning for COVID-19 infection (fever, chills, cough, or new shortness of breath).   Comes in today. Here alone. BP sky high here today - typically does not check at home and has no plan to start - just happy "to wake up on this side of the grass today" - has had no medicines today. Was given Norvasc, Plavix, Imdur and Lopressor at discharge - no sl NTG - he was only sent home on Norvasc - thus NOT taking any of the others.  Tells me he is in remission from his cancer at this time but still on his chemo and will be having scans again in the future. He continues to have chest pain - all exertional related - relieved  with rest. He notes he "stops and goes". Labs from last week noted.    Past Medical History:  Diagnosis Date  . Bladder tumor   . Coronary artery disease cardiologist-- dr [name] (per pt last office visit 10/ 2018)   s/p  PCI w/ DES x1 to diagonal branch of LAD  . DDD (degenerative disc disease), lumbar   . Degenerative joint disease   . History of concussion    per pt age 68 w/ brief loc--- no residual  . Hyperlipidemia   . Hypertension   . OA (osteoarthritis)   . S/P drug eluting coronary stent placement 10/24/2007   diagonal branch of LAD  . Type 2 diabetes mellitus treated with insulin ([place])    followed by pcp  . Varicose vein of leg   . Wears glasses     Past Surgical History:  Procedure Laterality Date  . APPENDECTOMY  age 56  . COLONOSCOPY  2012  . CORONARY ANGIOPLASTY  08-26-2010   dr [name]   previous stent patent,  proximal prior to previous stent 99% stenosis;  Unsuccessful multiple attempts PCI, unability to cross with a ballon;  medical management  . CORONARY ANGIOPLASTY WITH STENT PLACEMENT  10-23-2017  dr Darnell Level brodie   positive ischemia on myoview;   40% pLAD and D1 80%,  diagonal branch treated w/  PCI and DES   . EXCISION MIDLINE SUPERIOR THYOID ISTHMUS MASS  11-27-2008   dr Ernesto Rutherford   bening lipoma  . KNEE ARTHROSCOPY Left 01-22-2004   dr Percell Miller  . POSTERIOR LUMBAR FUSION  11-14-2012   dr Christella Noa @[facility]    L3 -- 5  . SHOULDER ARTHROSCOPY Right 2019  . SHOULDER ARTHROSCOPY WITH ROTATOR CUFF REPAIR AND SUBACROMIAL DECOMPRESSION Left 11-12-2009   dr Percell Miller  @[facility]    w/ Labral debridement and DCR  . TONSILLECTOMY  age 73  . TOTAL KNEE ARTHROPLASTY Left 02/17/2016   Procedure: TOTAL KNEE ARTHROPLASTY;  Surgeon: Ninetta Lights, MD;  Location: [place];  Service: Orthopedics;  Laterality: Left;  . TOTAL KNEE ARTHROPLASTY Right 02-25-2004   dr Percell Miller  @[facility]   . TRANSURETHRAL RESECTION OF BLADDER TUMOR N/A 08/31/2018   Procedure: TRANSURETHRAL RESECTION OF BLADDER TUMOR  (TURBT) WITH INSTILLATION OF POST OPERATIVE CHEMOTHERAPY;  Surgeon: Kathie Rhodes, MD;  Location: [facility];  Service: Urology;  Laterality: N/A;  . VARICOSE VEIN SURGERY  1995     Medications: Current Meds  Medication Sig  . amLODipine (NORVASC) 5 MG tablet Take 1 tablet (5 mg total) by mouth daily.  Marland Kitchen aspirin EC 81 MG EC tablet Take 1 tablet (81 mg total) by mouth daily.  . clopidogrel (PLAVIX) 75 MG tablet Take 1 tablet (75 mg total) by mouth daily.  . diclofenac sodium (VOLTAREN) 1 % GEL Apply 1 application topically at bedtime as needed (pain.).  Marland Kitchen Dulaglutide (TRULICITY) 1.5 0000000 SOPN Inject 1.5 mg into the skin every Sunday.   . empagliflozin (JARDIANCE) 25 MG TABS tablet Take 25 mg by mouth daily.  . Evolocumab (REPATHA SURECLICK) [id number] MG/ML SOAJ Inject 1 pen into the skin every 14 (fourteen) days.  . Homeopathic Products (THERAWORX GLOVE + FOAM EX) Apply 1 application topically 2 (two) times daily as needed (leg cramps).  . insulin glargine (LANTUS) 100 UNIT/ML injection Inject 50 Units into the skin at bedtime.   . insulin lispro (HUMALOG) 100 UNIT/ML KwikPen Inject 15 Units into the skin 2 (two) times daily.  . isosorbide mononitrate (IMDUR) 30 MG 24 hr tablet Take 1 tablet (30 mg total) by mouth daily.  . metoprolol tartrate (LOPRESSOR) 25 MG tablet Take 1 tablet (25 mg total) by mouth 2 (two) times daily.  . prochlorperazine (COMPAZINE) 10 MG tablet TAKE 1 TABLET BY MOUTH EVERY 6 HOURS AS NEEDED FOR NAUSEA AND VOMITING (Patient taking differently: Take 10 mg by mouth every 6 (six) hours as needed for nausea or vomiting. )  . tetrahydrozoline 0.05 % ophthalmic solution Place 1 drop into both eyes at bedtime as needed (eye irritation).  . [DISCONTINUED] clopidogrel (PLAVIX) 75 MG tablet Take 1 tablet (75 mg total) by mouth daily.  . [DISCONTINUED] isosorbide mononitrate (IMDUR) 30 MG 24 hr tablet Take 1 tablet (30 mg total) by mouth daily.  . [DISCONTINUED] metoprolol tartrate  (LOPRESSOR) 25 MG tablet Take 1 tablet (25 mg total) by mouth 2 (two) times daily.     Allergies: Allergies  Allergen Reactions  . Lisinopril Other (See Comments)    Severe cough Hair ball in throat  . Statins Hives and Other (See Comments)    Other reaction(s): Cough (ALLERGY/intolerance), Myalgias (intolerance) No cholesterol meds  . Atorvastatin Other (See Comments)    REACTION: myalgies  . Rosuvastatin Itching    Social History: The patient  reports that he has never smoked. He has never used smokeless tobacco. He reports that he does not drink alcohol  or use drugs.   Family History: The patient's family history includes Heart attack in his brother and father; Hypertension in his father.   Review of Systems: Please see the history of present illness.   All other systems are reviewed and negative.   Physical Exam: VS:  BP (!) 180/120 (BP Location: Left Arm, Patient Position: Sitting, Cuff Size: Normal) Comment: did not take meds this am  Pulse 88   Ht 6' (1.829 m)   Wt 236 lb 1.9 oz (107.1 kg)   SpO2 99% Comment: at rest  BMI 32.02 kg/m  .  BMI Body mass index is 32.02 kg/m.  Wt Readings from Last 3 Encounters:  12/10/18 236 lb 1.9 oz (107.1 kg)  12/05/18 238 lb 9.6 oz (108.2 kg)  11/25/18 225 lb 1.6 oz (102.1 kg)    General: Pleasant. Alert and in no acute distress.   HEENT: Normal.  Neck: Supple, no JVD, carotid bruits, or masses noted.  Cardiac: Regular rate and rhythm. No murmurs, rubs, or gallops. No edema.  Respiratory:  Lungs are clear to auscultation bilaterally with normal work of breathing.  GI: Soft and nontender.  MS: No deformity or atrophy. Gait and ROM intact.  Skin: Warm and dry. Color is normal.  Neuro:  Strength and sensation are intact and no gross focal deficits noted.  Psych: Alert, appropriate and with normal affect.   LABORATORY DATA:  EKG:  EKG is not ordered today.  Lab Results  Component Value Date   WBC 5.0 12/05/2018   HGB  9.5 (L) 12/05/2018   HCT 28.0 (L) 12/05/2018   PLT 211 12/05/2018   GLUCOSE 137 (H) 12/05/2018   CHOL 223 (H) 11/23/2018   TRIG 237 (H) 11/23/2018   HDL 39 (L) 11/23/2018   LDLCALC 137 (H) 11/23/2018   ALT 27 12/05/2018   AST 21 12/05/2018   NA 140 12/05/2018   K 4.7 12/05/2018   CL 104 12/05/2018   CREATININE 1.81 (H) 12/05/2018   BUN 32 (H) 12/05/2018   CO2 20 (L) 12/05/2018   TSH 1.808 12/16/2010   INR 0.98 11/08/2012   HGBA1C 9.5 (H) 11/23/2018     BNP (last 3 results) Recent Labs    11/22/18 1724  BNP 54.2    ProBNP (last 3 results) No results for input(s): PROBNP in the last 8760 hours.   Other Studies Reviewed Today:  Echo IMPRESSIONS 11/2018   1. Left ventricular ejection fraction, by visual estimation, is 60 to 65%. The left ventricle has normal function. There is mildly increased left ventricular hypertrophy.  2. Left ventricular diastolic parameters are indeterminate.  3. Global right ventricle has normal systolic function.The right ventricular size is normal. No increase in right ventricular wall thickness.  4. Left atrial size was moderately dilated.  5. Right atrial size was normal.  6. Trivial pericardial effusion is present.  7. The mitral valve is normal in structure. No evidence of mitral valve regurgitation.  8. The tricuspid valve is normal in structure. Tricuspid valve regurgitation is trivial.  9. There is Mild calcification of the aortic valve. 10. The aortic valve is tricuspid. Aortic valve regurgitation is not visualized. 11. The pulmonic valve was not well visualized. Pulmonic valve regurgitation is not visualized. 12. Normal pulmonary artery systolic pressure. 13. The inferior vena cava is normal in size with <50% respiratory variability, suggesting right atrial pressure of 8 mmHg.    Assessment & Plan   1. NSTEMI - management affected by underlying CKD/anemia - he  was not interested in cardiac cath due to his CKD - EF is normal by  echo - received 48 hours of Heparin, was to be placed on multiple anti-anginal agents along with DAPT - this has NOT happened - I have started Imdur 30 mg, Lopressor 25 mg BID and Plavix 75 mg as originally planned. Will also send in RX for sl NTG to have on hand - reminded in how to use. Unclear how long he will be able to be on Plavix - his counts and possible biopsy may play a role in this.   2. Known CAD with remote cath/PCI in 2009 to a diagonal branch with subsequent subtotal occlusion of this in 2012 - not able to be intervened on. See above.   3. HLD - intolerant to statins due to myalgias (also with liver mets) - on PCSK9 therapy - has had good response reported in the past - seeing pharmacy later this morning.   4. CKD stage 3 - recent lab noted.   5. Anemia - has had recent transfusion.   6. Metastatic urothelial cancer with hepatic metastases - on chemo - followed by Oncology. He notes he may have to have a liver biopsy at some point.   7. COVID-19 Education: The signs and symptoms of COVID-19 were discussed with the patient and how to seek care for testing (follow up with PCP or arrange E-visit).  The importance of social distancing, staying at home, hand hygiene and wearing a mask when out in public were discussed today.  Current medicines are reviewed with the patient today.  The patient does not have concerns regarding medicines other than what has been noted above.  The following changes have been made:  See above.  Labs/ tests ordered today include:   No orders of the defined types were placed in this encounter.    Disposition:   FU with [place] in a few weeks.   Patient is agreeable to this plan and will call if any problems develop in the interim.   SignedTruitt Merle, NP  12/10/2018 10:25 AM  [facility] [address] [place], [place]  [address] Phone: [phone] Fax: [phone]      Addendum - 12/10/18 Noted  phone call regarding the medicines that were a primary issue from today's visit.   Pt calling in today due to having appt with Truitt Merle, NP today and was told to call office if pt could not get medications that were sent in today.Pt called the first time  to state [facility] would not fill imdur.   Called [facility] and s/w Beth at [facility] @ [phone] and stated another pharmacy had already sent imdur into insurance co and wold have to [facility] to take the hold off the medication.  Pt also stated was still taking benicar (40 mg ) when pt felt like it , ex: "two days before going to doctor's office".  Stated this was D/C from hospital .  Pt stated he knew this.  Pt called in again and stated all the medications that were supposed to filled at the hospital before pt left were filled and pt just found medication in car between the console.  Will send to [place] to [place].  Noted.  Burtis Junes, RN, [place] [address] [place], [place]  [address] [phone]

## 2018-12-10 ENCOUNTER — Other Ambulatory Visit: Payer: Self-pay

## 2018-12-10 ENCOUNTER — Ambulatory Visit: Payer: Medicare Other | Admitting: Nurse Practitioner

## 2018-12-10 ENCOUNTER — Encounter: Payer: Self-pay | Admitting: Nurse Practitioner

## 2018-12-10 ENCOUNTER — Telehealth: Payer: Self-pay | Admitting: *Deleted

## 2018-12-10 ENCOUNTER — Ambulatory Visit (INDEPENDENT_AMBULATORY_CARE_PROVIDER_SITE_OTHER): Payer: Medicare Other

## 2018-12-10 VITALS — BP 180/120 | HR 88 | Ht 72.0 in | Wt 236.1 lb

## 2018-12-10 DIAGNOSIS — M791 Myalgia, unspecified site: Secondary | ICD-10-CM | POA: Diagnosis not present

## 2018-12-10 DIAGNOSIS — G720 Drug-induced myopathy: Secondary | ICD-10-CM

## 2018-12-10 DIAGNOSIS — C787 Secondary malignant neoplasm of liver and intrahepatic bile duct: Secondary | ICD-10-CM

## 2018-12-10 DIAGNOSIS — I214 Non-ST elevation (NSTEMI) myocardial infarction: Secondary | ICD-10-CM | POA: Diagnosis not present

## 2018-12-10 DIAGNOSIS — I25110 Atherosclerotic heart disease of native coronary artery with unstable angina pectoris: Secondary | ICD-10-CM

## 2018-12-10 DIAGNOSIS — I25118 Atherosclerotic heart disease of native coronary artery with other forms of angina pectoris: Secondary | ICD-10-CM

## 2018-12-10 DIAGNOSIS — E782 Mixed hyperlipidemia: Secondary | ICD-10-CM

## 2018-12-10 DIAGNOSIS — Z7189 Other specified counseling: Secondary | ICD-10-CM

## 2018-12-10 MED ORDER — CLOPIDOGREL BISULFATE 75 MG PO TABS: 75.0000 mg | ORAL_TABLET | Freq: Every day | ORAL | 2 refills | Status: DC

## 2018-12-10 MED ORDER — ISOSORBIDE MONONITRATE ER 30 MG PO TB24: 30.0000 mg | ORAL_TABLET | Freq: Every day | ORAL | 2 refills | Status: DC

## 2018-12-10 MED ORDER — METOPROLOL TARTRATE 25 MG PO TABS: 25.0000 mg | ORAL_TABLET | Freq: Two times a day (BID) | ORAL | 2 refills | Status: DC

## 2018-12-10 MED ORDER — VASCEPA 1 G PO CAPS: 2.0000 g | ORAL_CAPSULE | Freq: Two times a day (BID) | ORAL | 5 refills | Status: DC

## 2018-12-10 MED ORDER — NITROGLYCERIN 0.4 MG SL SUBL: 0.4000 mg | SUBLINGUAL_TABLET | SUBLINGUAL | 3 refills | Status: AC | PRN

## 2018-12-10 NOTE — Progress Notes (Signed)
Patient ID: Chad Yu                 DOB: Apr 13, 1950                    MRN: [id number]     HPI: Chad Yu is a 68 y.o. male patient referred to lipid clinic by Dr. [name]. PMH is significant for CAD s/p PCI to diag branch in 2009 and subsequent subtotal occlusion in 2012 (no intervention), HLD, HTN, T2DM, and bladder cancer. He was hospitalized on 11/22/18 for unstable angina and it was recommended to continue medical management without LHC.   Patient was previously on [place] therapy until about 2 years ago prescribed through his PCP. Pt reports he was unable to get his insurance to cover it. After hospital discharge, new prior authorization was submitted for Repatha for continuation of therapy. His insurance approved the prior authorization and pt began on Repatha again on 11/25/18 after 2 year hiatus.  Patient presents to lipid clinic for initial visit. He states that he started his first Repatha dose on 11/8 and had his second dose yesterday, 11/22. No complaints with giving injections. He states his copay for Repatha was only ~$25 so he did not need any patient assistance at this time.   Current Medications: Repatha 140 mg [place] every 14 days Intolerances: Crestor 5 mg daily (hives), atorvastatin (muscle pain), fenofibrate (weakness) Risk Factors: CAD, HTN, HLD, T2DM LDL goal: <70  Diet: eggs, bacon, oatmeal or grits; chicken, potatoes, steak, cheese, peas, broccoli, cottage cheese; unsweet tea and water   Exercise: errands and yard work, help the neighbor mow grass  Family History: heart attack in father and brother, HTN in father  Social History: no smoking, no alcohol   Labs: 11/23/18: TC 223, TG 237, HDL 39, LDL 137 - checked at hospitalization for unstable angina, no lipid lowering therapy (consulted to start [place] after this)  Past Medical History:  Diagnosis Date  . Bladder tumor   . Coronary artery disease cardiologist-- dr [name] (per pt last office visit 10/  2018)   s/p  PCI w/ DES x1 to diagonal branch of LAD  . DDD (degenerative disc disease), lumbar   . Degenerative joint disease   . History of concussion    per pt age 41 w/ brief loc--- no residual  . Hyperlipidemia   . Hypertension   . OA (osteoarthritis)   . S/P drug eluting coronary stent placement 10/24/2007   diagonal branch of LAD  . Type 2 diabetes mellitus treated with insulin ([place])    followed by pcp  . Varicose vein of leg   . Wears glasses     Current Outpatient Medications on File Prior to Visit  Medication Sig Dispense Refill  . amLODipine (NORVASC) 5 MG tablet Take 1 tablet (5 mg total) by mouth daily. 30 tablet 0  . aspirin EC 81 MG EC tablet Take 1 tablet (81 mg total) by mouth daily.    . clopidogrel (PLAVIX) 75 MG tablet Take 1 tablet (75 mg total) by mouth daily. 90 tablet 2  . diclofenac sodium (VOLTAREN) 1 % GEL Apply 1 application topically at bedtime as needed (pain.).    Marland Kitchen Dulaglutide (TRULICITY) 1.5 QQ/2.2LN SOPN Inject 1.5 mg into the skin every Sunday.     . empagliflozin (JARDIANCE) 25 MG TABS tablet Take 25 mg by mouth daily.    . Evolocumab (REPATHA SURECLICK) 989 MG/ML SOAJ Inject 1 pen into the skin every 14 (  fourteen) days. 2 pen 11  . Homeopathic Products (THERAWORX GLOVE + FOAM EX) Apply 1 application topically 2 (two) times daily as needed (leg cramps).    . insulin glargine (LANTUS) 100 UNIT/ML injection Inject 50 Units into the skin at bedtime.     . insulin lispro (HUMALOG) 100 UNIT/ML KwikPen Inject 15 Units into the skin 2 (two) times daily.    . isosorbide mononitrate (IMDUR) 30 MG 24 hr tablet Take 1 tablet (30 mg total) by mouth daily. 90 tablet 2  . metoprolol tartrate (LOPRESSOR) 25 MG tablet Take 1 tablet (25 mg total) by mouth 2 (two) times daily. 180 tablet 2  . prochlorperazine (COMPAZINE) 10 MG tablet TAKE 1 TABLET BY MOUTH EVERY 6 HOURS AS NEEDED FOR NAUSEA AND VOMITING (Patient taking differently: Take 10 mg by mouth every 6 (six)  hours as needed for nausea or vomiting. ) 30 tablet 0  . tetrahydrozoline 0.05 % ophthalmic solution Place 1 drop into both eyes at bedtime as needed (eye irritation).    . [DISCONTINUED] fenofibrate (TRICOR) 145 MG tablet Take 145 mg by mouth daily.       No current facility-administered medications on file prior to visit.     Allergies  Allergen Reactions  . Lisinopril Other (See Comments)    Severe cough Hair ball in throat  . Statins Hives and Other (See Comments)    Other reaction(s): Cough (ALLERGY/intolerance), Myalgias (intolerance) No cholesterol meds  . Atorvastatin Other (See Comments)    REACTION: myalgies  . Rosuvastatin Itching    Assessment/Plan:  1. Hyperlipidemia - LDL is 137 (goal < 70) and TG is 237 (goal < 150). Continue Repatha 140 mg [place] once every 14 days. Start Vascepa 2 g PO BID. Next lipid panel in ~2-3 months after starting Vascepa. Lipids and LFTs scheduled for 02/18/19 since he will also be seeing Dr. [name] this day for follow up. Patient could qualify for [place] if needed for copay assistance in the future. Patient aware and will call back if he would like to be enrolled.   Vertis Kelch, PharmD PGY2 Cardiology Pharmacy Resident [facility] [address] [place], [place] [address] Phone: [phone]; Fax: [phone]  [employer] Evolocumab [id number] Site No: [id number] Subject ID No: [id number]     Did subject meet all eligibility criteria?  Code  No Yes  97  Adults age ? 40 years []  [x]   One or both of the following:   102 A  Hospitalization for a clinical ASCVD event: acute (MI), UA, IS, or CLI within 18 months of enrollment        Note: Subjects must have been admitted to the hospital. Those       who are admitted and discharged in less than 24 hours are      Eligible for the study.Subjects who have been admitted to     The ER for a clinical ASCVD. []  [x]   102B Coronary or peripheral revascularization including  percutaneous or surgical revascularization in the past 18 months [x]  []   One of the following:  103 A  LDL ? 70 mg/dL (1.81 mmol/L) with no plans for immediate initiation or titration of statin therapy []  [x]   103 B Newly started on PCSK9i after the index hospitalization/procedure and prior to enrollment (but no more than 6 months prior to enrollment) with pre-PCSK9i treatment LDL-C value available and measured within 6 months of starting PCSK9i and known background LLT any time prior to  PCSK9i initiation. []  [x]   105 Planned follow-up within the health system []  [x]   Subject does not meet any of the following exclusion criteria   201 Unable or unwilling to provide informed consent, including but not limited to cognitive or language barriers (reading or comprehension) [x]  []   202 Lack of phone or email for contact [x]  []   203 Evidence of end stage renal disease (ESRD) or stage 5 CKD [x]  []   204 Anticipated life expectancy less than 6 month  [x]  []   206  On a PCSK9i prior to their qualifying event [x]  []      Subject Name: Chad Yu  Subject met inclusion and exclusion criteria.  The informed consent form, study requirements and expectations were reviewed with the subject and questions and concerns were addressed prior to the signing of the consent form.  The subject verbalized understanding of the trial requirements.  The subject agreed to participate in the [place] trial and signed the informed consent at 11am on11/23/20.  The informed consent was obtained prior to performance of any protocol-specific procedures for the subject.  A copy of the signed informed consent was given to the subject and a copy was placed in the subject's medical record.   Megan Supple   Socioeconomic Status - Baseline  Education Some High School or Less High School graduate Some College/University Graduated College/University or above []  [x]  []  []    Marital Status Married Single Divorced Separated  [x]  []  []  []    Income <$15,000 $15,001 - $34,999 $35,000 - $74,999 - reported $43,000  $75,000 - $99,999 >$100,000 []  []  [x]  []  []

## 2018-12-10 NOTE — Patient Instructions (Addendum)
It was nice to see you today!  Start taking Vascepa 2 capsules twice daily.  Continue taking Repatha  Your next cholesterol check will be on 02/18/19 on the same day as your visit with Dr. Julianne Handler. You can come any time after 7:30am. Please be fasting for this lab work.   Please call us at [phone] if you have any questions or concerns

## 2018-12-10 NOTE — Telephone Encounter (Signed)
Pt calling in today due to having appt with Truitt Merle, NP today and was told to call office if pt could not get medications that were sent in today.Pt called the first time  to state [facility] not [facility]ur.   [facility] and s[facility]w Beth at [facility] @ [phone] and stated another pharmacy had already sent imdur into insurance co and [facility] the medication.  Pt also stated was still taking benicar (40 mg ) when pt felt like it , ex: "two days before going to doctor's office".  Stated this was D/C from hospital .  Pt stated he knew this.  Pt called in again and stated all the medications that were supposed to filled at the hospital before pt left were filled and pt just found medication in car between the console.  Will send to [place] to [place].

## 2018-12-10 NOTE — Patient Instructions (Addendum)
After Visit Summary:  We will be checking the following labs today - NONE   Medication Instructions:    Continue with your current medicines.   I have sent in the RX for the Imdur, the Plavix and the Metoprolol today. I have sent in a RX for NTG sl - Use your NTG under your tongue for recurrent chest pain. May take one tablet every 5 minutes. If you are still having discomfort after 3 tablets in 15 minutes, call 911.    If you need a refill on your cardiac medications before your next appointment, please call your pharmacy.     Testing/Procedures To Be Arranged:  N/A  Follow-Up:   See me or Dr. [name] in 2 to 3 weeks.     At [facility], you and your health needs are our priority.  As part of our continuing mission to provide you with exceptional heart care, we have created designated Provider Care Teams.  These Care Teams include your primary Cardiologist (physician) and Advanced Practice Providers (APPs -  Physician Assistants and Nurse Practitioners) who all work together to provide you with the care you need, when you need it.  Special Instructions:  . Stay safe, stay home, wash your hands for at least 20 seconds and wear a mask when out in public.  . It was good to talk with you today.    Call the [place] office at [phone] if you have any questions, problems or concerns.

## 2018-12-14 ENCOUNTER — Other Ambulatory Visit: Payer: Self-pay

## 2018-12-14 ENCOUNTER — Inpatient Hospital Stay: Payer: Medicare Other

## 2018-12-14 DIAGNOSIS — C679 Malignant neoplasm of bladder, unspecified: Secondary | ICD-10-CM

## 2018-12-14 DIAGNOSIS — Z5111 Encounter for antineoplastic chemotherapy: Secondary | ICD-10-CM | POA: Diagnosis not present

## 2018-12-14 LAB — CMP (CANCER CENTER ONLY)
ALT: 31 U/L (ref 0–44)
AST: 22 U/L (ref 15–41)
Albumin: 3.6 g/dL (ref 3.5–5.0)
Alkaline Phosphatase: 65 U/L (ref 38–126)
Anion gap: 13 (ref 5–15)
BUN: 38 mg/dL — ABNORMAL HIGH (ref 8–23)
CO2: 23 mmol/L (ref 22–32)
Calcium: 9 mg/dL (ref 8.9–10.3)
Chloride: 105 mmol/L (ref 98–111)
Creatinine: 1.8 mg/dL — ABNORMAL HIGH (ref 0.61–1.24)
GFR, Est AFR Am: 44 mL/min — ABNORMAL LOW (ref >60)
GFR, Estimated: 38 mL/min — ABNORMAL LOW (ref >60)
Glucose, Bld: 124 mg/dL — ABNORMAL HIGH (ref 70–99)
Potassium: 5 mmol/L (ref 3.5–5.1)
Sodium: 141 mmol/L (ref 135–145)
Total Bilirubin: 0.4 mg/dL (ref 0.3–1.2)
Total Protein: 6.5 g/dL (ref 6.5–8.1)

## 2018-12-14 LAB — CBC WITH DIFFERENTIAL (CANCER CENTER ONLY)
Abs Immature Granulocytes: 0.02 10*3/uL (ref 0.00–0.07)
Basophils Absolute: 0 10*3/uL (ref 0.0–0.1)
Basophils Relative: 1 %
Eosinophils Absolute: 0.1 10*3/uL (ref 0.0–0.5)
Eosinophils Relative: 3 %
HCT: 22.1 % — ABNORMAL LOW (ref 39.0–52.0)
Hemoglobin: 7.4 g/dL — ABNORMAL LOW (ref 13.0–17.0)
Immature Granulocytes: 1 %
Lymphocytes Relative: 30 %
Lymphs Abs: 0.8 10*3/uL (ref 0.7–4.0)
MCH: 31.8 pg (ref 26.0–34.0)
MCHC: 33.5 g/dL (ref 30.0–36.0)
MCV: 94.8 fL (ref 80.0–100.0)
Monocytes Absolute: 0.3 10*3/uL (ref 0.1–1.0)
Monocytes Relative: 9 %
Neutro Abs: 1.6 10*3/uL — ABNORMAL LOW (ref 1.7–7.7)
Neutrophils Relative %: 56 %
Platelet Count: 83 10*3/uL — ABNORMAL LOW (ref 150–400)
RBC: 2.33 MIL/uL — ABNORMAL LOW (ref 4.22–5.81)
RDW: 16.5 % — ABNORMAL HIGH (ref 11.5–15.5)
WBC Count: 2.8 10*3/uL — ABNORMAL LOW (ref 4.0–10.5)
nRBC: 0 % (ref 0.0–0.2)

## 2018-12-14 NOTE — Progress Notes (Signed)
Patient complained of ringing in his ears, vision problems and numbness in his legs. Dr. Alen Blew informed. Verbal order from Dr. Alen Blew to hold off on treatment today due to low Plt of 83 and Hgb of 7.4. Also, to inform patient that complains are side effects of his chemo treatment which he would address in their next meeting/appointment. Patient informed and he verbalized understanding.

## 2018-12-27 ENCOUNTER — Ambulatory Visit: Payer: Medicare Other

## 2018-12-27 ENCOUNTER — Inpatient Hospital Stay: Payer: Medicare Other | Attending: Oncology

## 2018-12-27 ENCOUNTER — Inpatient Hospital Stay (HOSPITAL_BASED_OUTPATIENT_CLINIC_OR_DEPARTMENT_OTHER): Payer: Medicare Other | Admitting: Oncology

## 2018-12-27 ENCOUNTER — Inpatient Hospital Stay: Payer: Medicare Other

## 2018-12-27 ENCOUNTER — Other Ambulatory Visit: Payer: Self-pay

## 2018-12-27 VITALS — BP 143/73 | HR 85 | Temp 98.2°F | Resp 18 | Ht 72.0 in | Wt 236.8 lb

## 2018-12-27 DIAGNOSIS — D649 Anemia, unspecified: Secondary | ICD-10-CM | POA: Insufficient documentation

## 2018-12-27 DIAGNOSIS — Z5111 Encounter for antineoplastic chemotherapy: Secondary | ICD-10-CM | POA: Insufficient documentation

## 2018-12-27 DIAGNOSIS — C679 Malignant neoplasm of bladder, unspecified: Secondary | ICD-10-CM

## 2018-12-27 DIAGNOSIS — C678 Malignant neoplasm of overlapping sites of bladder: Secondary | ICD-10-CM

## 2018-12-27 LAB — CMP (CANCER CENTER ONLY)
ALT: 30 U/L (ref 0–44)
AST: 30 U/L (ref 15–41)
Albumin: 3.8 g/dL (ref 3.5–5.0)
Alkaline Phosphatase: 81 U/L (ref 38–126)
Anion gap: 11 (ref 5–15)
BUN: 40 mg/dL — ABNORMAL HIGH (ref 8–23)
CO2: 24 mmol/L (ref 22–32)
Calcium: 9.3 mg/dL (ref 8.9–10.3)
Chloride: 105 mmol/L (ref 98–111)
Creatinine: 1.95 mg/dL — ABNORMAL HIGH (ref 0.61–1.24)
GFR, Est AFR Am: 40 mL/min — ABNORMAL LOW (ref >60)
GFR, Estimated: 34 mL/min — ABNORMAL LOW (ref >60)
Glucose, Bld: 187 mg/dL — ABNORMAL HIGH (ref 70–99)
Potassium: 4.7 mmol/L (ref 3.5–5.1)
Sodium: 140 mmol/L (ref 135–145)
Total Bilirubin: 0.3 mg/dL (ref 0.3–1.2)
Total Protein: 7.1 g/dL (ref 6.5–8.1)

## 2018-12-27 LAB — CBC WITH DIFFERENTIAL (CANCER CENTER ONLY)
Abs Immature Granulocytes: 0.02 10*3/uL (ref 0.00–0.07)
Basophils Absolute: 0 10*3/uL (ref 0.0–0.1)
Basophils Relative: 1 %
Eosinophils Absolute: 0.1 10*3/uL (ref 0.0–0.5)
Eosinophils Relative: 2 %
HCT: 27.6 % — ABNORMAL LOW (ref 39.0–52.0)
Hemoglobin: 9.1 g/dL — ABNORMAL LOW (ref 13.0–17.0)
Immature Granulocytes: 1 %
Lymphocytes Relative: 20 %
Lymphs Abs: 0.7 10*3/uL (ref 0.7–4.0)
MCH: 33.5 pg (ref 26.0–34.0)
MCHC: 33 g/dL (ref 30.0–36.0)
MCV: 101.5 fL — ABNORMAL HIGH (ref 80.0–100.0)
Monocytes Absolute: 0.5 10*3/uL (ref 0.1–1.0)
Monocytes Relative: 16 %
Neutro Abs: 2.1 10*3/uL (ref 1.7–7.7)
Neutrophils Relative %: 60 %
Platelet Count: 248 10*3/uL (ref 150–400)
RBC: 2.72 MIL/uL — ABNORMAL LOW (ref 4.22–5.81)
RDW: 19.3 % — ABNORMAL HIGH (ref 11.5–15.5)
WBC Count: 3.5 10*3/uL — ABNORMAL LOW (ref 4.0–10.5)
nRBC: 0 % (ref 0.0–0.2)

## 2018-12-27 MED ORDER — SODIUM CHLORIDE 0.9 % IV SOLN
800.0000 mg/m2 | Freq: Once | INTRAVENOUS | Status: AC
  Administered 2018-12-27: 1900 mg via INTRAVENOUS
  Filled 2018-12-27: qty 49.97

## 2018-12-27 MED ORDER — SODIUM CHLORIDE 0.9 % IV SOLN
406.0000 mg | Freq: Once | INTRAVENOUS | Status: AC
  Administered 2018-12-27: 410 mg via INTRAVENOUS
  Filled 2018-12-27: qty 41

## 2018-12-27 MED ORDER — PALONOSETRON HCL INJECTION 0.25 MG/5ML
0.2500 mg | Freq: Once | INTRAVENOUS | Status: AC
  Administered 2018-12-27: 0.25 mg via INTRAVENOUS

## 2018-12-27 MED ORDER — SODIUM CHLORIDE 0.9 % IV SOLN
Freq: Once | INTRAVENOUS | Status: AC
  Administered 2018-12-27: 12:00:00 via INTRAVENOUS
  Filled 2018-12-27: qty 250

## 2018-12-27 MED ORDER — SODIUM CHLORIDE 0.9 % IV SOLN
Freq: Once | INTRAVENOUS | Status: AC
  Administered 2018-12-27: 12:00:00 via INTRAVENOUS
  Filled 2018-12-27: qty 5

## 2018-12-27 MED ORDER — PALONOSETRON HCL INJECTION 0.25 MG/5ML
INTRAVENOUS | Status: AC
  Filled 2018-12-27: qty 5

## 2018-12-27 NOTE — Progress Notes (Signed)
Hematology and Oncology Follow Up Visit  Hoan Rough [id number] 12-02-1950 68 y.o. 12/27/2018 11:38 AM [name], MDSun, [name], MD   Principle Diagnosis: 68 year old man with bladder cancer diagnosed in August 2020.  He was found to have stage IV disease with questionable hepatic involvement. Prior Therapy:   He is status post TURBT completed in August 2020 which showed high-grade urothelial carcinoma with muscle invasion.  Chemotherapy utilizing cisplatin and gemcitabine started on 10/04/2018.  He received gemcitabine 1000 mg per metered square, cisplatin 70 mg per metered square on day 1 and gemcitabine 1000 mg per metered square on day 8 out of 21-day cycle.  He completed 3 cycles of therapy.  Chemotherapy will be switched to carboplatin based regimen because of renal insufficiency.   Current therapy: Carboplatin and gemcitabine first cycle given on 12/06/2018.  He is here for the subsequent cycle which is a cycle 5 for total chemotherapy.    Interim History: Mr. Woolman presents today for a repeat evaluation.  Since the last visit, he completed last cycle of chemotherapy without any major complications.  He denies any nausea, vomiting or abdominal pain.  He does report some occasional cramps in the morning.  He denies any excessive fatigue or tiredness.  He denies any recent hospitalizations or illnesses.  Continues to perform activities of daily living without any decline.   Patient denied headaches, blurry vision, syncope or seizures.  Denies any fevers, chills or sweats.  Denied chest pain, palpitation, orthopnea or leg edema.  Denied cough, wheezing or hemoptysis.  Denied nausea, vomiting or abdominal pain.  Denies any constipation or diarrhea.  Denies any frequency urgency or hesitancy.  Denies any arthralgias or myalgias.  Denies any skin rashes or lesions.  Denies any bleeding or clotting tendency.  Denies any easy bruising.  Denies any hair or nail changes.  Denies any anxiety or  depression.  Remaining review of system is negative.        Medications: Without any changes on review. Current Outpatient Medications  Medication Sig Dispense Refill  . amLODipine (NORVASC) 5 MG tablet Take 1 tablet (5 mg total) by mouth daily. 30 tablet 0  . aspirin EC 81 MG EC tablet Take 1 tablet (81 mg total) by mouth daily.    . clopidogrel (PLAVIX) 75 MG tablet Take 1 tablet (75 mg total) by mouth daily. 90 tablet 2  . diclofenac sodium (VOLTAREN) 1 % GEL Apply 1 application topically at bedtime as needed (pain.).    Marland Kitchen Dulaglutide (TRULICITY) 1.5 0000000 SOPN Inject 1.5 mg into the skin every Sunday.     . empagliflozin (JARDIANCE) 25 MG TABS tablet Take 25 mg by mouth daily.    . Evolocumab (REPATHA SURECLICK) XX123456 MG/ML SOAJ Inject 1 pen into the skin every 14 (fourteen) days. 2 pen 11  . Homeopathic Products (THERAWORX GLOVE + FOAM EX) Apply 1 application topically 2 (two) times daily as needed (leg cramps).    Marland Kitchen icosapent Ethyl (VASCEPA) 1 g capsule Take 2 capsules (2 g total) by mouth 2 (two) times daily. 120 capsule 5  . insulin glargine (LANTUS) 100 UNIT/ML injection Inject 50 Units into the skin at bedtime.     . insulin lispro (HUMALOG) 100 UNIT/ML KwikPen Inject 15 Units into the skin 2 (two) times daily.    . isosorbide mononitrate (IMDUR) 30 MG 24 hr tablet Take 1 tablet (30 mg total) by mouth daily. 90 tablet 2  . metoprolol tartrate (LOPRESSOR) 25 MG tablet Take 1 tablet (25  mg total) by mouth 2 (two) times daily. 180 tablet 2  . nitroGLYCERIN (NITROSTAT) 0.4 MG SL tablet Place 1 tablet (0.4 mg total) under the tongue every 5 (five) minutes as needed for chest pain. 90 tablet 3  . prochlorperazine (COMPAZINE) 10 MG tablet TAKE 1 TABLET BY MOUTH EVERY 6 HOURS AS NEEDED FOR NAUSEA AND VOMITING (Patient taking differently: Take 10 mg by mouth every 6 (six) hours as needed for nausea or vomiting. ) 30 tablet 0  . tetrahydrozoline 0.05 % ophthalmic solution Place 1 drop into  both eyes at bedtime as needed (eye irritation).     No current facility-administered medications for this visit.     Allergies:  Allergies  Allergen Reactions  . Lisinopril Other (See Comments)    Severe cough Hair ball in throat  . Statins Hives and Other (See Comments)    Other reaction(s): Cough (ALLERGY/intolerance), Myalgias (intolerance) No cholesterol meds  . Atorvastatin Other (See Comments)    REACTION: myalgies  . Rosuvastatin Itching    Past Medical History, Surgical history, Social history, and Family History updated without any changes.   Physical Exam: Blood pressure (!) 143/73, pulse 85, temperature 98.2 F (36.8 C), temperature source Temporal, resp. rate 18, height 6' (1.829 m), weight 236 lb 12.8 oz (107.4 kg), SpO2 100 %.    ECOG: 1    General appearance: Alert, awake without any distress. Head: Atraumatic without abnormalities Oropharynx: Without any thrush or ulcers. Eyes: No scleral icterus. Lymph nodes: No lymphadenopathy noted in the cervical, supraclavicular, or axillary nodes Heart:regular rate and rhythm, without any murmurs or gallops.   Lung: Clear to auscultation without any rhonchi, wheezes or dullness to percussion. Abdomin: Soft, nontender without any shifting dullness or ascites. Musculoskeletal: No clubbing or cyanosis. Neurological: No motor or sensory deficits. Skin: No rashes or lesions.      Lab Results: Lab Results  Component Value Date   WBC 3.5 (L) 12/27/2018   HGB 9.1 (L) 12/27/2018   HCT 27.6 (L) 12/27/2018   MCV 101.5 (H) 12/27/2018   PLT 248 12/27/2018     Chemistry      Component Value Date/Time   NA 140 12/27/2018 1058   K 4.7 12/27/2018 1058   CL 105 12/27/2018 1058   CO2 24 12/27/2018 1058   BUN 40 (H) 12/27/2018 1058   CREATININE 1.95 (H) 12/27/2018 1058      Component Value Date/Time   CALCIUM 9.3 12/27/2018 1058   ALKPHOS 81 12/27/2018 1058   AST 30 12/27/2018 1058   ALT 30 12/27/2018 1058    BILITOT 0.3 12/27/2018 1058      Impression and Plan:  68 year old with:  1.    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     He is currently on chemotherapy utilizing carboplatin and gemcitabine and completed a total of 4 cycles of platinum based regimen.  Risks and benefits of proceeding with cycle 5 and cycle 6 of therapy were reviewed.  Potential complications including nausea, vomiting as well as myelosuppression were updated.  At this time he is agreeable to continue we will repeat imaging studies after cycle 6.   2. IV access: He continues to use peripheral veins at this time and declined the use of a Port-A-Cath.  3. Antiemetics: No nausea or vomiting reported at this time.  Compazine is available to him.  4. Renal function surveillance:Creatinine clearance relatively stable and will make a dose adjustment to  his carboplatin based on creatinine clearance.   5.  Neutropenia: Resolved at this time with a normal absolute neutrophil count.  6. Goals of care: Therapy remains aggressive at this time with potential curative options although appears his disease might not be curable.  7. Follow-up:  He will return in 1 week for day 8 of cycle 5 and 3 weeks for cycle 6.  25  minutes was spent with the patient face-to-face today.  More than 50% of time was spent on updating his disease status, treatment options and answering questions regarding future plan of care.  Zola Button, MD 12/10/202011:38 AM

## 2018-12-27 NOTE — Patient Instructions (Signed)
[facility] Discharge Instructions for Patients Receiving Chemotherapy  Today you received the following chemotherapy agents: gemcitabine and carboplatin.  To help prevent nausea and vomiting after your treatment, we encourage you to take your nausea medication as directed.   If you develop nausea and vomiting that is not controlled by your nausea medication, call the clinic.   BELOW ARE SYMPTOMS THAT SHOULD BE REPORTED IMMEDIATELY:  *FEVER GREATER THAN 100.5 F  *CHILLS WITH OR WITHOUT FEVER  NAUSEA AND VOMITING THAT IS NOT CONTROLLED WITH YOUR NAUSEA MEDICATION  *UNUSUAL SHORTNESS OF BREATH  *UNUSUAL BRUISING OR BLEEDING  TENDERNESS IN MOUTH AND THROAT WITH OR WITHOUT PRESENCE OF ULCERS  *URINARY PROBLEMS  *BOWEL PROBLEMS  UNUSUAL RASH Items with * indicate a potential emergency and should be followed up as soon as possible.  Feel free to call the clinic should you have any questions or concerns. The clinic phone number is [phone].  Please show the CHEMO ALERT CARD at check-in to the Emergency Department and triage nurse.   

## 2018-12-27 NOTE — Progress Notes (Signed)
Per Dr. Shadad, ok to treat with elevated creatinine. 

## 2018-12-28 ENCOUNTER — Telehealth: Payer: Self-pay | Admitting: Oncology

## 2018-12-28 NOTE — Telephone Encounter (Signed)
No los per 12/10 

## 2018-12-29 NOTE — Progress Notes (Signed)
CARDIOLOGY OFFICE NOTE  Date:  01/02/2019    Chad Yu Date of Birth: 05-22-1950 Medical Record [id number]  PCP:  [name], MD  Cardiologist:  Aldona Bar  Chief Complaint  Patient presents with  . Follow-up    Seen for Dr. [name]    History of Present Illness: Chad Yu is a 68 y.o. male who presents today for a one month check.  Seen for Dr. [name].   He has a known history of CAD status post stent, hyperlipidemia but intolerant to statins, hypertension, diabetes mellitus type 2, and metastatic urothelial carcinoma with hepatic metastases currently undergoing chemotherapy.   Presented back in November with chest pain - worse with exertion - was at the cancer center with chest pain and EKG was done - transferred to [facility] for further evaluation.   He was treated with heparin - medical management was felt to be the best option for him due to his kidney impairment. He was seen by pharmacy for lipid management. No ACE/ARB due to CKD. Metformin stopped due to CKD. He was transfused one unit of blood for his anemia - also felt to be due to CKD. No cardiac cath.   I then saw him for his post hospital visit. BP pretty high. Does not check BP at home and has no plans to do so - pretty happy "to just wake up on this side of the grass". Medicines were unclear. Continued to have chest pain. Got him on Imdur as recommended and tried to get his medicines straightened out.   The patient does not have symptoms concerning for COVID-19 infection (fever, chills, cough, or new shortness of breath).   Comes in today. Here alone. He notes he is not doing well. He is back on his medicines as directed from last visit. But he is having more chest pain with exertion - will happen with minimal distance. Relieved with rest. Has not really used any sl NTG - says it will go away as soon as he stops the activity. He will have to take several stops and rest - had to do this with  coming in here today. He is considering cardiac cath but understands the risks. He will be having labs and chemo tomorrow at the Cancer center. He will get a little short of breath with this. His pain is described as midsternal tightness followed by a "nail-like" discomfort.   Past Medical History:  Diagnosis Date  . Bladder tumor   . Coronary artery disease cardiologist-- dr [name] (per pt last office visit 10/ 2018)   s/p  PCI w/ DES x1 to diagonal branch of LAD  . DDD (degenerative disc disease), lumbar   . Degenerative joint disease   . History of concussion    per pt age 38 w/ brief loc--- no residual  . Hyperlipidemia   . Hypertension   . OA (osteoarthritis)   . S/P drug eluting coronary stent placement 10/24/2007   diagonal branch of LAD  . Type 2 diabetes mellitus treated with insulin ([place])    followed by pcp  . Varicose vein of leg   . Wears glasses     Past Surgical History:  Procedure Laterality Date  . APPENDECTOMY  age 23  . COLONOSCOPY  2012  . CORONARY ANGIOPLASTY  08-26-2010   dr [name]   previous stent patent,  proximal prior to previous stent 99% stenosis;  Unsuccessful multiple attempts PCI, unability to cross with a ballon;  medical management  .  CORONARY ANGIOPLASTY WITH STENT PLACEMENT  10-23-2017  dr Darnell Level brodie   positive ischemia on myoview;   40% pLAD and D1 80%,  diagonal branch treated w/ PCI and DES   . EXCISION MIDLINE SUPERIOR THYOID ISTHMUS MASS  11-27-2008   dr Ernesto Rutherford   bening lipoma  . KNEE ARTHROSCOPY Left 01-22-2004   dr Percell Miller  . POSTERIOR LUMBAR FUSION  11-14-2012   dr Christella Noa @[facility]    L3 -- 5  . SHOULDER ARTHROSCOPY Right 2019  . SHOULDER ARTHROSCOPY WITH ROTATOR CUFF REPAIR AND SUBACROMIAL DECOMPRESSION Left 11-12-2009   dr Percell Miller  @[facility]    w/ Labral debridement and DCR  . TONSILLECTOMY  age 13  . TOTAL KNEE ARTHROPLASTY Left 02/17/2016   Procedure: TOTAL KNEE ARTHROPLASTY;  Surgeon: Ninetta Lights, MD;  Location: [facility];  Service:  Orthopedics;  Laterality: Left;  . TOTAL KNEE ARTHROPLASTY Right 02-25-2004   dr Percell Miller  @[facility]   . TRANSURETHRAL RESECTION OF BLADDER TUMOR N/A 08/31/2018   Procedure: TRANSURETHRAL RESECTION OF BLADDER TUMOR (TURBT) WITH INSTILLATION OF POST OPERATIVE CHEMOTHERAPY;  Surgeon: Kathie Rhodes, MD;  Location: [facility];  Service: Urology;  Laterality: N/A;  . VARICOSE VEIN SURGERY  1995     Medications: Current Meds  Medication Sig  . amLODipine (NORVASC) 5 MG tablet Take 1 tablet (5 mg total) by mouth daily.  Marland Kitchen aspirin EC 81 MG EC tablet Take 1 tablet (81 mg total) by mouth daily.  . clopidogrel (PLAVIX) 75 MG tablet Take 1 tablet (75 mg total) by mouth daily.  . diclofenac sodium (VOLTAREN) 1 % GEL Apply 1 application topically at bedtime as needed (pain.).  Marland Kitchen Dulaglutide (TRULICITY) 1.5 0000000 SOPN Inject 1.5 mg into the skin every Sunday.   . empagliflozin (JARDIANCE) 25 MG TABS tablet Take 25 mg by mouth daily.  . Evolocumab (REPATHA SURECLICK) XX123456 MG/ML SOAJ Inject 1 pen into the skin every 14 (fourteen) days.  . Homeopathic Products (THERAWORX GLOVE + FOAM EX) Apply 1 application topically 2 (two) times daily as needed (leg cramps).  Marland Kitchen icosapent Ethyl (VASCEPA) 1 g capsule Take 2 capsules (2 g total) by mouth 2 (two) times daily.  . insulin glargine (LANTUS) 100 UNIT/ML injection Inject 50 Units into the skin at bedtime.   . insulin lispro (HUMALOG) 100 UNIT/ML KwikPen Inject 15 Units into the skin 2 (two) times daily.  . nitroGLYCERIN (NITROSTAT) 0.4 MG SL tablet Place 1 tablet (0.4 mg total) under the tongue every 5 (five) minutes as needed for chest pain.  Marland Kitchen prochlorperazine (COMPAZINE) 10 MG tablet TAKE 1 TABLET BY MOUTH EVERY 6 HOURS AS NEEDED FOR NAUSEA AND VOMITING  . tetrahydrozoline 0.05 % ophthalmic solution Place 1 drop into both eyes at bedtime as needed (eye irritation).  . [DISCONTINUED] isosorbide mononitrate (IMDUR) 30 MG 24 hr tablet Take 1 tablet (30 mg total) by mouth daily.   . [DISCONTINUED] metoprolol tartrate (LOPRESSOR) 25 MG tablet Take 1 tablet (25 mg total) by mouth 2 (two) times daily.     Allergies: Allergies  Allergen Reactions  . Lisinopril Other (See Comments)    Severe cough Hair ball in throat  . Statins Hives and Other (See Comments)    Other reaction(s): Cough (ALLERGY/intolerance), Myalgias (intolerance) No cholesterol meds  . Atorvastatin Other (See Comments)    REACTION: myalgies  . Rosuvastatin Itching    Social History: The patient  reports that he has never smoked. He has never used smokeless tobacco. He reports that he does not drink alcohol  or use drugs.   Family History: The patient's family history includes Heart attack in his brother and father; Hypertension in his father.   Review of Systems: Please see the history of present illness.   All other systems are reviewed and negative.   Physical Exam: VS:  BP 132/70   Pulse 92   Ht 6' (1.829 m)   Wt 236 lb (107 kg)   SpO2 99%   BMI 32.01 kg/m  .  BMI Body mass index is 32.01 kg/m.  Wt Readings from Last 3 Encounters:  01/02/19 236 lb (107 kg)  12/27/18 236 lb 12.8 oz (107.4 kg)  12/10/18 236 lb 1.9 oz (107.1 kg)    General: Alert and in no acute distress. He looks pasty to me today.   HEENT: Normal.  Neck: Supple, no JVD, carotid bruits, or masses noted.  Cardiac: Regular rate and rhythm. No murmurs, rubs, or gallops. No edema.  Respiratory:  Lungs are clear to auscultation bilaterally with normal work of breathing.  GI: Soft and nontender.  MS: No deformity or atrophy. Gait and ROM intact.  Skin: Warm and dry. Color is sallow.   Neuro:  Strength and sensation are intact and no gross focal deficits noted.  Psych: Alert, appropriate and with normal affect.   LABORATORY DATA:  EKG:  EKG is ordered today. This shows NSR - ST depression V3 with lateral T wave changes.   Lab Results  Component Value Date   WBC 3.5 (L) 12/27/2018   HGB 9.1 (L) 12/27/2018    HCT 27.6 (L) 12/27/2018   PLT 248 12/27/2018   GLUCOSE 187 (H) 12/27/2018   CHOL 223 (H) 11/23/2018   TRIG 237 (H) 11/23/2018   HDL 39 (L) 11/23/2018   LDLCALC 137 (H) 11/23/2018   ALT 30 12/27/2018   AST 30 12/27/2018   NA 140 12/27/2018   K 4.7 12/27/2018   CL 105 12/27/2018   CREATININE 1.95 (H) 12/27/2018   BUN 40 (H) 12/27/2018   CO2 24 12/27/2018   TSH 1.808 12/16/2010   INR 0.98 11/08/2012   HGBA1C 9.5 (H) 11/23/2018     BNP (last 3 results) Recent Labs    11/22/18 1724  BNP 54.2    ProBNP (last 3 results) No results for input(s): PROBNP in the last 8760 hours.   Other Studies Reviewed Today:  Echo IMPRESSIONS 11/2018  1. Left ventricular ejection fraction, by visual estimation, is 60 to 65%. The left ventricle has normal function. There is mildly increased left ventricular hypertrophy. 2. Left ventricular diastolic parameters are indeterminate. 3. Global right ventricle has normal systolic function.The right ventricular size is normal. No increase in right ventricular wall thickness. 4. Left atrial size was moderately dilated. 5. Right atrial size was normal. 6. Trivial pericardial effusion is present. 7. The mitral valve is normal in structure. No evidence of mitral valve regurgitation. 8. The tricuspid valve is normal in structure. Tricuspid valve regurgitation is trivial. 9. There is Mild calcification of the aortic valve. 10. The aortic valve is tricuspid. Aortic valve regurgitation is not visualized. 11. The pulmonic valve was not well visualized. Pulmonic valve regurgitation is not visualized. 12. Normal pulmonary artery systolic pressure. 13. The inferior vena cava is normal in size with <50% respiratory variability, suggesting right atrial pressure of 8 mmHg.    Assessment & Plan   1. Recent NSTEMI November 2020 - his initial management was affected by underlying CKD/anemia - he was not interested in cardiac cath due to  possible  need for dialysis/worsening CKD.   Now with more unstable angina - he is now considering cardiac cath if that is still a possibility for him. He wants to try further medicines - I am increasing his Imdur to 60 mg a day. I am increasing his Lopressor to 50 mg BID. I will get him a visit to see Dr. [name] in 2 weeks for further discussion and disposition. He has NTG on hand - reminded in how to use. He remains on DAPT as well. Very tenuous prognosis.   2. Known CAD with remote cath/PCI from 2009 to a diagonal branch with subsequent subtotal occlusion of this in 2012 - not able to be intervened on.   3. HLD - on therapy with PCSK9 and fibrate therapy. He is intolerant to statins (also with liver mets)  4.  Anemia - he is having labs tomorrow - I worry this has gotten worse. He has had prior transfusion.   5. CKD  6. Metastatic urothelial cancer with hepatic metastases - on chemo - followed by Oncology. He is for chemo tomorrow.   7. COVID-19 Education: The signs and symptoms of COVID-19 were discussed with the patient and how to seek care for testing (follow up with PCP or arrange E-visit).  The importance of social distancing, staying at home, hand hygiene and wearing a mask when out in public were discussed today.  Current medicines are reviewed with the patient today.  The patient does not have concerns regarding medicines other than what has been noted above.  The following changes have been made:  See above.  Labs/ tests ordered today include:    Orders Placed This Encounter  Procedures  . EKG 12-Lead     Disposition:   FU with Dr. [name] on 12/31. I am happy to see back as needed.   Patient is agreeable to this plan and will call if any problems develop in the interim.   SignedTruitt Merle, NP  01/02/2019 2:33 PM  [place] [address] [place], [place]  [address] Phone: [phone] Fax: [phone]

## 2019-01-02 ENCOUNTER — Ambulatory Visit: Payer: Medicare Other | Admitting: Nurse Practitioner

## 2019-01-02 ENCOUNTER — Other Ambulatory Visit: Payer: Self-pay

## 2019-01-02 ENCOUNTER — Encounter: Payer: Self-pay | Admitting: Nurse Practitioner

## 2019-01-02 VITALS — BP 132/70 | HR 92 | Ht 72.0 in | Wt 236.0 lb

## 2019-01-02 DIAGNOSIS — I214 Non-ST elevation (NSTEMI) myocardial infarction: Secondary | ICD-10-CM

## 2019-01-02 DIAGNOSIS — E782 Mixed hyperlipidemia: Secondary | ICD-10-CM

## 2019-01-02 DIAGNOSIS — I2510 Atherosclerotic heart disease of native coronary artery without angina pectoris: Secondary | ICD-10-CM

## 2019-01-02 DIAGNOSIS — T466X5A Adverse effect of antihyperlipidemic and antiarteriosclerotic drugs, initial encounter: Secondary | ICD-10-CM

## 2019-01-02 DIAGNOSIS — Z7189 Other specified counseling: Secondary | ICD-10-CM

## 2019-01-02 DIAGNOSIS — G720 Drug-induced myopathy: Secondary | ICD-10-CM

## 2019-01-02 DIAGNOSIS — I200 Unstable angina: Secondary | ICD-10-CM

## 2019-01-02 DIAGNOSIS — I25110 Atherosclerotic heart disease of native coronary artery with unstable angina pectoris: Secondary | ICD-10-CM

## 2019-01-02 MED ORDER — ISOSORBIDE MONONITRATE ER 60 MG PO TB24: 60.0000 mg | ORAL_TABLET | Freq: Every day | ORAL | 3 refills | Status: DC

## 2019-01-02 MED ORDER — METOPROLOL TARTRATE 50 MG PO TABS: 50.0000 mg | ORAL_TABLET | Freq: Two times a day (BID) | ORAL | 3 refills | Status: AC

## 2019-01-02 NOTE — Patient Instructions (Addendum)
After Visit Summary:  We will be checking the following labs today - NONE   Lab at the Cancer center tomorrow as planned.    Medication Instructions:    Continue with your current medicines. BUT  I am increasing the Imdur to 60 mg a day - take 2 of the 30 mg tablets daily and use those up - the RX for the 60 mg is at [facility]  I am increasing the Lopressor to 50 mg twice a day - take 2 of your 25 mg tablets twice a day and use those up - the RX for the 50 mg dose is at [facility]    If you need a refill on your cardiac medications before your next appointment, please call your pharmacy.     Testing/Procedures To Be Arranged:  N/A  Follow-Up:   See Dr. [name] back later this month to discuss possible cardiac catheterization.     At [facility], you and your health needs are our priority.  As part of our continuing mission to provide you with exceptional heart care, we have created designated Provider Care Teams.  These Care Teams include your primary Cardiologist (physician) and Advanced Practice Providers (APPs -  Physician Assistants and Nurse Practitioners) who all work together to provide you with the care you need, when you need it.  Special Instructions:  . Stay safe, stay home, wash your hands for at least 20 seconds and wear a mask when out in public.  . It was good to talk with you today.    Call the [place] office at [phone] if you have any questions, problems or concerns.

## 2019-01-03 ENCOUNTER — Other Ambulatory Visit: Payer: Self-pay

## 2019-01-03 ENCOUNTER — Inpatient Hospital Stay: Payer: Medicare Other

## 2019-01-03 ENCOUNTER — Telehealth: Payer: Self-pay

## 2019-01-03 DIAGNOSIS — Z5111 Encounter for antineoplastic chemotherapy: Secondary | ICD-10-CM | POA: Diagnosis not present

## 2019-01-03 DIAGNOSIS — C678 Malignant neoplasm of overlapping sites of bladder: Secondary | ICD-10-CM

## 2019-01-03 DIAGNOSIS — C679 Malignant neoplasm of bladder, unspecified: Secondary | ICD-10-CM

## 2019-01-03 LAB — CMP (CANCER CENTER ONLY)
ALT: 44 U/L (ref 0–44)
AST: 26 U/L (ref 15–41)
Albumin: 3.4 g/dL — ABNORMAL LOW (ref 3.5–5.0)
Alkaline Phosphatase: 72 U/L (ref 38–126)
Anion gap: 12 (ref 5–15)
BUN: 42 mg/dL — ABNORMAL HIGH (ref 8–23)
CO2: 20 mmol/L — ABNORMAL LOW (ref 22–32)
Calcium: 8.6 mg/dL — ABNORMAL LOW (ref 8.9–10.3)
Chloride: 109 mmol/L (ref 98–111)
Creatinine: 1.91 mg/dL — ABNORMAL HIGH (ref 0.61–1.24)
GFR, Est AFR Am: 41 mL/min — ABNORMAL LOW (ref >60)
GFR, Estimated: 35 mL/min — ABNORMAL LOW (ref >60)
Glucose, Bld: 164 mg/dL — ABNORMAL HIGH (ref 70–99)
Potassium: 4.7 mmol/L (ref 3.5–5.1)
Sodium: 141 mmol/L (ref 135–145)
Total Bilirubin: 0.2 mg/dL — ABNORMAL LOW (ref 0.3–1.2)
Total Protein: 6.3 g/dL — ABNORMAL LOW (ref 6.5–8.1)

## 2019-01-03 LAB — CBC WITH DIFFERENTIAL (CANCER CENTER ONLY)
Abs Immature Granulocytes: 0 10*3/uL (ref 0.00–0.07)
Basophils Absolute: 0 10*3/uL (ref 0.0–0.1)
Basophils Relative: 1 %
Eosinophils Absolute: 0.1 10*3/uL (ref 0.0–0.5)
Eosinophils Relative: 3 %
HCT: 19.2 % — ABNORMAL LOW (ref 39.0–52.0)
Hemoglobin: 6.5 g/dL — CL (ref 13.0–17.0)
Immature Granulocytes: 0 %
Lymphocytes Relative: 43 %
Lymphs Abs: 0.8 10*3/uL (ref 0.7–4.0)
MCH: 33.7 pg (ref 26.0–34.0)
MCHC: 33.9 g/dL (ref 30.0–36.0)
MCV: 99.5 fL (ref 80.0–100.0)
Monocytes Absolute: 0.2 10*3/uL (ref 0.1–1.0)
Monocytes Relative: 10 %
Neutro Abs: 0.8 10*3/uL — ABNORMAL LOW (ref 1.7–7.7)
Neutrophils Relative %: 43 %
Platelet Count: 113 10*3/uL — ABNORMAL LOW (ref 150–400)
RBC: 1.93 MIL/uL — ABNORMAL LOW (ref 4.22–5.81)
RDW: 16.5 % — ABNORMAL HIGH (ref 11.5–15.5)
WBC Count: 1.9 10*3/uL — ABNORMAL LOW (ref 4.0–10.5)
nRBC: 0 % (ref 0.0–0.2)

## 2019-01-03 LAB — PREPARE RBC (CROSSMATCH)

## 2019-01-03 LAB — ABO/RH: ABO/RH(D): O POS

## 2019-01-03 MED ORDER — ACETAMINOPHEN 325 MG PO TABS
650.0000 mg | ORAL_TABLET | Freq: Once | ORAL | Status: AC
  Administered 2019-01-03: 650 mg via ORAL

## 2019-01-03 MED ORDER — ACETAMINOPHEN 325 MG PO TABS
ORAL_TABLET | ORAL | Status: AC
  Filled 2019-01-03: qty 2

## 2019-01-03 MED ORDER — SODIUM CHLORIDE 0.9% IV SOLUTION
250.0000 mL | Freq: Once | INTRAVENOUS | Status: AC
  Administered 2019-01-03: 250 mL via INTRAVENOUS
  Filled 2019-01-03: qty 250

## 2019-01-03 MED ORDER — DIPHENHYDRAMINE HCL 25 MG PO CAPS
25.0000 mg | ORAL_CAPSULE | Freq: Once | ORAL | Status: AC
  Administered 2019-01-03: 25 mg via ORAL

## 2019-01-03 MED ORDER — DIPHENHYDRAMINE HCL 25 MG PO CAPS
ORAL_CAPSULE | ORAL | Status: AC
  Filled 2019-01-03: qty 1

## 2019-01-03 NOTE — Progress Notes (Signed)
Received call from [facility] that patient will not receive chemo today but will need 2 units RBC's per Dr. Alen Blew. Patient made aware and going to lab for T&S. Aware to remain in the lobby after lab for Lanelle Bal RN to update him on the schedule for receiving RRBC's

## 2019-01-03 NOTE — Patient Instructions (Signed)
Blood Transfusion, Adult, Care After This sheet gives you information about how to care for yourself after your procedure. Your doctor may also give you more specific instructions. If you have problems or questions, contact your doctor. Follow these instructions at home:   Take over-the-counter and prescription medicines only as told by your doctor.  Go back to your normal activities as told by your doctor.  Follow instructions from your doctor about how to take care of the area where an IV tube was put into your vein (insertion site). Make sure you: ? Wash your hands with soap and water before you change your bandage (dressing). If there is no soap and water, use hand sanitizer. ? Change your bandage as told by your doctor.  Check your IV insertion site every day for signs of infection. Check for: ? More redness, swelling, or pain. ? More fluid or blood. ? Warmth. ? Pus or a bad smell. Contact a doctor if:  You have more redness, swelling, or pain around the IV insertion site.  You have more fluid or blood coming from the IV insertion site.  Your IV insertion site feels warm to the touch.  You have pus or a bad smell coming from the IV insertion site.  Your pee (urine) turns pink, red, or brown.  You feel weak after doing your normal activities. Get help right away if:  You have signs of a serious allergic or body defense (immune) system reaction, including: ? Itchiness. ? Hives. ? Trouble breathing. ? Anxiety. ? Pain in your chest or lower back. ? Fever, flushing, and chills. ? Fast pulse. ? Rash. ? Watery poop (diarrhea). ? Throwing up (vomiting). ? Dark pee. ? Serious headache. ? Dizziness. ? Stiff neck. ? Yellow color in your face or the white parts of your eyes (jaundice). Summary  After a blood transfusion, return to your normal activities as told by your doctor.  Every day, check for signs of infection where the IV tube was put into your vein.  Some  signs of infection are warm skin, more redness and pain, more fluid or blood, and pus or a bad smell where the needle went in.  Contact your doctor if you feel weak or have any unusual symptoms. This information is not intended to replace advice given to you by your health care provider. Make sure you discuss any questions you have with your health care provider. Document Released: 01/24/2014 Document Revised: 05/10/2017 Document Reviewed: 08/28/2015 Elsevier Patient Education  2020 Elsevier Inc.  

## 2019-01-03 NOTE — Telephone Encounter (Signed)
Received a call from [facility] in the lab. Hemoglobin is 6.5. Dr. Alen Blew made aware and verbal order received for 2 units of blood. Lab appointment added for Type and Screen and patient is aware that he will receive 2 units today in [facility].

## 2019-01-04 LAB — BPAM RBC
Blood Product Expiration Date: 202101152359
Blood Product Expiration Date: 202101152359
ISSUE DATE / TIME: 202012171110
ISSUE DATE / TIME: 202012171110
Unit Type and Rh: 5100
Unit Type and Rh: 5100

## 2019-01-04 LAB — TYPE AND SCREEN
ABO/RH(D): O POS
Antibody Screen: NEGATIVE
Unit division: 0
Unit division: 0

## 2019-01-10 DIAGNOSIS — G720 Drug-induced myopathy: Secondary | ICD-10-CM | POA: Insufficient documentation

## 2019-01-17 ENCOUNTER — Encounter: Payer: Self-pay | Admitting: Cardiovascular Disease

## 2019-01-17 ENCOUNTER — Ambulatory Visit: Payer: Medicare Other | Admitting: Oncology

## 2019-01-17 ENCOUNTER — Inpatient Hospital Stay: Payer: Medicare Other

## 2019-01-17 ENCOUNTER — Ambulatory Visit (INDEPENDENT_AMBULATORY_CARE_PROVIDER_SITE_OTHER): Payer: Medicare Other | Admitting: Cardiovascular Disease

## 2019-01-17 ENCOUNTER — Inpatient Hospital Stay (HOSPITAL_BASED_OUTPATIENT_CLINIC_OR_DEPARTMENT_OTHER): Payer: Medicare Other | Admitting: Oncology

## 2019-01-17 ENCOUNTER — Other Ambulatory Visit: Payer: Self-pay

## 2019-01-17 ENCOUNTER — Ambulatory Visit: Payer: Medicare Other

## 2019-01-17 ENCOUNTER — Other Ambulatory Visit: Payer: Medicare Other

## 2019-01-17 VITALS — BP 142/76 | Ht 72.0 in | Wt 239.8 lb

## 2019-01-17 VITALS — BP 158/80 | HR 90 | Temp 98.0°F | Resp 20 | Wt 236.7 lb

## 2019-01-17 DIAGNOSIS — C679 Malignant neoplasm of bladder, unspecified: Secondary | ICD-10-CM

## 2019-01-17 DIAGNOSIS — Z5111 Encounter for antineoplastic chemotherapy: Secondary | ICD-10-CM | POA: Diagnosis not present

## 2019-01-17 DIAGNOSIS — I25118 Atherosclerotic heart disease of native coronary artery with other forms of angina pectoris: Secondary | ICD-10-CM

## 2019-01-17 DIAGNOSIS — C678 Malignant neoplasm of overlapping sites of bladder: Secondary | ICD-10-CM

## 2019-01-17 DIAGNOSIS — E782 Mixed hyperlipidemia: Secondary | ICD-10-CM | POA: Diagnosis not present

## 2019-01-17 LAB — CMP (CANCER CENTER ONLY)
ALT: 30 U/L (ref 0–44)
AST: 15 U/L (ref 15–41)
Albumin: 3.5 g/dL (ref 3.5–5.0)
Alkaline Phosphatase: 87 U/L (ref 38–126)
Anion gap: 12 (ref 5–15)
BUN: 34 mg/dL — ABNORMAL HIGH (ref 8–23)
CO2: 21 mmol/L — ABNORMAL LOW (ref 22–32)
Calcium: 8.7 mg/dL — ABNORMAL LOW (ref 8.9–10.3)
Chloride: 104 mmol/L (ref 98–111)
Creatinine: 1.86 mg/dL — ABNORMAL HIGH (ref 0.61–1.24)
GFR, Est AFR Am: 42 mL/min — ABNORMAL LOW (ref >60)
GFR, Estimated: 36 mL/min — ABNORMAL LOW (ref >60)
Glucose, Bld: 279 mg/dL — ABNORMAL HIGH (ref 70–99)
Potassium: 4.3 mmol/L (ref 3.5–5.1)
Sodium: 137 mmol/L (ref 135–145)
Total Bilirubin: 0.4 mg/dL (ref 0.3–1.2)
Total Protein: 6.8 g/dL (ref 6.5–8.1)

## 2019-01-17 LAB — CBC WITH DIFFERENTIAL (CANCER CENTER ONLY)
Abs Immature Granulocytes: 0.02 10*3/uL (ref 0.00–0.07)
Basophils Absolute: 0 10*3/uL (ref 0.0–0.1)
Basophils Relative: 0 %
Eosinophils Absolute: 0.1 10*3/uL (ref 0.0–0.5)
Eosinophils Relative: 3 %
HCT: 29.5 % — ABNORMAL LOW (ref 39.0–52.0)
Hemoglobin: 9.8 g/dL — ABNORMAL LOW (ref 13.0–17.0)
Immature Granulocytes: 1 %
Lymphocytes Relative: 19 %
Lymphs Abs: 0.6 10*3/uL — ABNORMAL LOW (ref 0.7–4.0)
MCH: 32.1 pg (ref 26.0–34.0)
MCHC: 33.2 g/dL (ref 30.0–36.0)
MCV: 96.7 fL (ref 80.0–100.0)
Monocytes Absolute: 0.8 10*3/uL (ref 0.1–1.0)
Monocytes Relative: 24 %
Neutro Abs: 1.7 10*3/uL (ref 1.7–7.7)
Neutrophils Relative %: 53 %
Platelet Count: 194 10*3/uL (ref 150–400)
RBC: 3.05 MIL/uL — ABNORMAL LOW (ref 4.22–5.81)
RDW: 18.1 % — ABNORMAL HIGH (ref 11.5–15.5)
WBC Count: 3.3 10*3/uL — ABNORMAL LOW (ref 4.0–10.5)
nRBC: 0 % (ref 0.0–0.2)

## 2019-01-17 MED ORDER — SODIUM CHLORIDE 0.9 % IV SOLN
800.0000 mg/m2 | Freq: Once | INTRAVENOUS | Status: AC
  Administered 2019-01-17: 1900 mg via INTRAVENOUS
  Filled 2019-01-17: qty 49.97

## 2019-01-17 MED ORDER — PALONOSETRON HCL INJECTION 0.25 MG/5ML
INTRAVENOUS | Status: AC
  Filled 2019-01-17: qty 5

## 2019-01-17 MED ORDER — PALONOSETRON HCL INJECTION 0.25 MG/5ML
0.2500 mg | Freq: Once | INTRAVENOUS | Status: AC
  Administered 2019-01-17: 0.25 mg via INTRAVENOUS

## 2019-01-17 MED ORDER — SODIUM CHLORIDE 0.9 % IV SOLN
Freq: Once | INTRAVENOUS | Status: AC
  Filled 2019-01-17: qty 5

## 2019-01-17 MED ORDER — SODIUM CHLORIDE 0.9 % IV SOLN
415.5000 mg | Freq: Once | INTRAVENOUS | Status: AC
  Administered 2019-01-17: 420 mg via INTRAVENOUS
  Filled 2019-01-17: qty 42

## 2019-01-17 MED ORDER — SODIUM CHLORIDE 0.9 % IV SOLN: Freq: Once | INTRAVENOUS | Status: DC

## 2019-01-17 MED ORDER — SODIUM CHLORIDE 0.9 % IV SOLN
Freq: Once | INTRAVENOUS | Status: AC
  Filled 2019-01-17: qty 250

## 2019-01-17 NOTE — Progress Notes (Signed)
Decrease dexamethasone in premedications due to diabetes/elevated blood glucose to 6mg  per Dr. Alen Blew. Orders updated.   Demetrius Charity, PharmD, [facility] Pharmacist Pharmacy Phone: [phone] 01/17/2019

## 2019-01-17 NOTE — Progress Notes (Signed)
Hematology and Oncology Follow Up Visit  [name] [id number] 01-31-1950 68 y.o. 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     Prior Therapy:   He is status post TURBT completed in August 2020 which showed high-grade urothelial carcinoma with muscle invasion.  Chemotherapy utilizing cisplatin and gemcitabine started on 10/04/2018.  He received gemcitabine 1000 mg per metered square, cisplatin 70 mg per metered square on day 1 and gemcitabine 1000 mg per metered square on day 8 out of 21-day cycle.  He completed 3 cycles of therapy.  Chemotherapy will be switched to carboplatin based regimen because of renal insufficiency.   Current therapy: Carboplatin and gemcitabine first cycle given on 12/06/2018.  He is here for cycle six of therapy.    Interim History: Mr. Chad Yu returns today for a follow-up visit.  Since the last visit, he reports no major changes in his health.  He did not receive day 8 of the last chemotherapy because of cytopenias.  He did develop abdominal pain and flank pain in the last 24 hours had passed a kidney stone which has improved his pain at this time.  He still able to eat without any issues.  He denies any nausea, vomiting or diarrhea.  He denies any urinary frequency or urgency.  He continues to tolerate chemotherapy reasonably well.   He denied any alteration mental status, neuropathy, confusion or dizziness.  Denies any headaches or lethargy.  Denies any night sweats, weight loss or changes in appetite.  Denied orthopnea, dyspnea on exertion or chest discomfort.  Denies shortness of breath, difficulty breathing hemoptysis or cough.  Denies any abdominal distention, nausea, early satiety or dyspepsia.  Denies any hematuria, frequency, dysuria or nocturia.  Denies any skin irritation, dryness or rash.  Denies any ecchymosis or petechiae.   Denies any lymphadenopathy or clotting.  Denies any heat or cold intolerance.  Denies any anxiety or depression.  Remaining review of system is negative.             Medications: Updated without any changes.  Thank you Current Outpatient Medications  Medication Sig Dispense Refill  . amLODipine (NORVASC) 5 MG tablet Take 1 tablet (5 mg total) by mouth daily. 30 tablet 0  . aspirin EC 81 MG EC tablet Take 1 tablet (81 mg total) by mouth daily.    . clopidogrel (PLAVIX) 75 MG tablet Take 1 tablet (75 mg total) by mouth daily. 90 tablet 2  . diclofenac sodium (VOLTAREN) 1 % GEL Apply 1 application topically at bedtime as needed (pain.).    Marland Kitchen Dulaglutide (TRULICITY) 1.5 0000000 SOPN Inject 1.5 mg into the skin every Sunday.     . empagliflozin (JARDIANCE) 25 MG TABS tablet Take 25 mg by mouth daily.    . Evolocumab (REPATHA SURECLICK) XX123456 MG/ML SOAJ Inject 1 pen into the skin every 14 (fourteen) days. 2 pen 11  . Homeopathic Products (THERAWORX GLOVE + FOAM EX) Apply 1 application topically 2 (two) times daily as needed (leg cramps).    Marland Kitchen icosapent Ethyl (VASCEPA) 1 g capsule Take 2 capsules (2 g total) by mouth 2 (two) times daily. 120 capsule 5  . insulin glargine (LANTUS) 100 UNIT/ML injection Inject 50 Units into the skin at bedtime.     . insulin lispro (HUMALOG) 100 UNIT/ML KwikPen Inject 15 Units into the skin 2 (two) times daily.    . isosorbide  mononitrate (IMDUR) 60 MG 24 hr tablet Take 1 tablet (60 mg total) by mouth daily. 90 tablet 3  . metoprolol tartrate (LOPRESSOR) 50 MG tablet Take 1 tablet (50 mg total) by mouth 2 (two) times daily. 180 tablet 3  . nitroGLYCERIN (NITROSTAT) 0.4 MG SL tablet Place 1 tablet (0.4 mg total) under the tongue every 5 (five) minutes as needed for chest pain. 90 tablet 3  . prochlorperazine (COMPAZINE) 10 MG tablet TAKE 1 TABLET BY MOUTH EVERY 6 HOURS AS NEEDED FOR NAUSEA AND VOMITING 30 tablet 0  . tetrahydrozoline 0.05 % ophthalmic solution  Place 1 drop into both eyes at bedtime as needed (eye irritation).     No current facility-administered medications for this visit.     Allergies:  Allergies  Allergen Reactions  . Lisinopril Other (See Comments)    Severe cough Hair ball in throat  . Statins Hives and Other (See Comments)    Other reaction(s): Cough (ALLERGY/intolerance), Myalgias (intolerance) No cholesterol meds  . Atorvastatin Other (See Comments)    REACTION: myalgies  . Rosuvastatin Itching    Past Medical History, Surgical history, Social history, and Family History reviewed without any changes.   Physical Exam:  Blood pressure (!) 158/80, pulse 90, temperature 98 F (36.7 C), temperature source Oral, resp. rate 20, weight 236 lb 11.2 oz (107.4 kg), SpO2 99 %.    ECOG: 1    General appearance: Comfortable appearing without any discomfort Head: Normocephalic without any trauma Oropharynx: Mucous membranes are moist and pink without any thrush or ulcers. Eyes: Pupils are equal and round reactive to light. Lymph nodes: No cervical, supraclavicular, inguinal or axillary lymphadenopathy.   Heart:regular rate and rhythm.  S1 and S2 without leg edema. Lung: Clear without any rhonchi or wheezes.  No dullness to percussion. Abdomin: Soft, nontender, nondistended with good bowel sounds.  No hepatosplenomegaly. Musculoskeletal: No joint deformity or effusion.  Full range of motion noted. Neurological: No deficits noted on motor, sensory and deep tendon reflex exam. Skin: No petechial rash or dryness.  Appeared moist.        Lab Results: Lab Results  Component Value Date   WBC 1.9 (L) 01/03/2019   HGB 6.5 (LL) 01/03/2019   HCT 19.2 (L) 01/03/2019   MCV 99.5 01/03/2019   PLT 113 (L) 01/03/2019     Chemistry      Component Value Date/Time   NA 141 01/03/2019 0857   K 4.7 01/03/2019 0857   CL 109 01/03/2019 0857   CO2 20 (L) 01/03/2019 0857   BUN 42 (H) 01/03/2019 0857   CREATININE 1.91 (H)  01/03/2019 0857      Component Value Date/Time   CALCIUM 8.6 (L) 01/03/2019 0857   ALKPHOS 72 01/03/2019 0857   AST 26 01/03/2019 0857   ALT 44 01/03/2019 0857   BILITOT 0.2 (L) 01/03/2019 0857      Impression and Plan:  68 year old with:  1.    Bladder cancer diagnosed in August 2020.  He was found to have high-grade urothelial carcinoma with hepatic lesions suspicious for metastasis.   Risks and benefits of proceeding with cycle 6 of chemotherapy was reviewed.  Complications include cytopenias, bleeding or infection were reiterated.  The plan is to complete the cycle and repeat imaging studies after that.  Switch maintenance with immunotherapy as a possibility depending on his response to therapy.    2. IV access: He has declined Port-A-Cath in the past.  Peripheral veins remain in use at this  time.  3. Antiemetics: Compazine is available to him without any recent nausea or vomiting.  4. Renal function surveillance:Creatinine clearance remains stable at 35 cc/min.  We will continue to monitor on platinum therapy.   5.  Neutropenia: Absolute neutrophil count is adequate today for treatment.  6. Goals of care: His disease appears to be incurable although aggressive measures are warranted given his reasonable performance status.  7.  Abdominal and flank pain: Appears to be related to nephrolithiasis and possible constipation.  Improved significantly at this time we will continue to monitor.  Repeat imaging studies will help to evaluate any other pathology.  8. Follow-up:  In 1 week to complete cycle 6 of therapy and subsequently return for repeat imaging studies in the next 3 to 4 weeks.  25  minutes was spent with the patient face-to-face today.  More than 50% of time was dedicated to reviewing his disease status, discussing treatment options and addressing complications to current therapy.  Zola Button, MD 12/31/20208:19 AM

## 2019-01-17 NOTE — Patient Instructions (Addendum)
Medication Instructions:  Your physician recommends that you continue on your current medications as directed. Please refer to the Current Medication list given to you today.  *If you need a refill on your cardiac medications before your next appointment, please call your pharmacy*  Lab Work: None ordered  If you have labs (blood work) drawn today and your tests are completely normal, you will receive your results only by: Marland Kitchen MyChart Message (if you have MyChart) OR . A paper copy in the mail If you have any lab test that is abnormal or we need to change your treatment, we will call you to review the results.  Testing/Procedures: None ordered  Follow-Up: Follow up with Dr. [name] on 04/22/19 at 10:20 AM  Other Instructions

## 2019-01-17 NOTE — Progress Notes (Signed)
Chief Complaint:  Chief Complaint  Patient presents with  . Follow-up    CAD   History of Present Illness: 68 yo male with history of DM, HTN, HLD, CAD s/p prior stenting, metastatic urothelial carcinoma here today for cardiac follow up. Last cath in August 2012 per Dr. [name]. The previously stented Diagonal branch had a patent Taxus stent but there was a sub-total occlusion prior to the stent in the diagonal branch that was unable to be opened. He has done well with medical management since then. He is intolerant to statins. I have not seen him since 2015. He was admitted to [facility] November 2020 with chest pain and mild elevation troponin (peak high sensitivity troponin 80). He was found to be anemic with CKD. He was treated with heparin, No cardiac cath secondary to CKD. Echo with LVEF=60-65% with no wall motion abnormalities.He was seen in follow up by Truitt Merle, NP on 01/02/19 and described ongoing exertional chest pain. His Imdur was increased to 60 mg daily and his metoprolol was increased to 50 mg BID. He questioned whether a cardiac cath was possible given his kidney disease. He is currently receiving ongoing chemotherapy for his urothelial cancer which is metastatic to his liver.   He is here today for follow up. The patient denies any chest pain, dyspnea, palpitations, lower extremity edema, orthopnea, PND, dizziness, near syncope or syncope.   Primary Care Physician: [name], MD   Past Medical History:  Diagnosis Date  . Bladder tumor   . Coronary artery disease cardiologist-- dr [name] (per pt last office visit 10/ 2018)   s/p  PCI w/ DES x1 to diagonal branch of LAD  . DDD (degenerative disc disease), lumbar   . Degenerative joint disease   . History of concussion    per pt age 13 w/ brief loc--- no residual  . Hyperlipidemia   . Hypertension   . OA (osteoarthritis)   . S/P drug eluting coronary stent placement 10/24/2007   diagonal branch of LAD  . Type 2 diabetes  mellitus treated with insulin ([place])    followed by pcp  . Varicose vein of leg   . Wears glasses     Past Surgical History:  Procedure Laterality Date  . APPENDECTOMY  age 5  . COLONOSCOPY  2012  . CORONARY ANGIOPLASTY  08-26-2010   dr [name]   previous stent patent,  proximal prior to previous stent 99% stenosis;  Unsuccessful multiple attempts PCI, unability to cross with a ballon;  medical management  . CORONARY ANGIOPLASTY WITH STENT PLACEMENT  10-23-2017  dr Darnell Level brodie   positive ischemia on myoview;   40% pLAD and D1 80%,  diagonal branch treated w/ PCI and DES   . EXCISION MIDLINE SUPERIOR THYOID ISTHMUS MASS  11-27-2008   dr Ernesto Rutherford   bening lipoma  . KNEE ARTHROSCOPY Left 01-22-2004   dr Percell Miller  . POSTERIOR LUMBAR FUSION  11-14-2012   dr Christella Noa @[facility]    L3 -- 5  . SHOULDER ARTHROSCOPY Right 2019  . SHOULDER ARTHROSCOPY WITH ROTATOR CUFF REPAIR AND SUBACROMIAL DECOMPRESSION Left 11-12-2009   dr Percell Miller  @[facility]    w/ Labral debridement and DCR  . TONSILLECTOMY  age 73  . TOTAL KNEE ARTHROPLASTY Left 02/17/2016   Procedure: TOTAL KNEE ARTHROPLASTY;  Surgeon: Ninetta Lights, MD;  Location: [facility];  Service: Orthopedics;  Laterality: Left;  . TOTAL KNEE ARTHROPLASTY Right 02-25-2004   dr Percell Miller  @[facility]   . TRANSURETHRAL RESECTION OF BLADDER  TUMOR N/A 08/31/2018   Procedure: TRANSURETHRAL RESECTION OF BLADDER TUMOR (TURBT) WITH INSTILLATION OF POST OPERATIVE CHEMOTHERAPY;  Surgeon: Kathie Rhodes, MD;  Location: [facility];  Service: Urology;  Laterality: N/A;  . VARICOSE VEIN SURGERY  1995    Current Outpatient Medications  Medication Sig Dispense Refill  . amLODipine (NORVASC) 5 MG tablet Take 1 tablet (5 mg total) by mouth daily. 30 tablet 0  . aspirin EC 81 MG EC tablet Take 1 tablet (81 mg total) by mouth daily.    . clopidogrel (PLAVIX) 75 MG tablet Take 1 tablet (75 mg total) by mouth daily. 90 tablet 2  . diclofenac sodium (VOLTAREN) 1 % GEL Apply 1 application topically at bedtime  as needed (pain.).    Marland Kitchen Dulaglutide (TRULICITY) 1.5 0000000 SOPN Inject 1.5 mg into the skin every Sunday.     . empagliflozin (JARDIANCE) 25 MG TABS tablet Take 25 mg by mouth daily.    . Evolocumab (REPATHA SURECLICK) XX123456 MG/ML SOAJ Inject 1 pen into the skin every 14 (fourteen) days. 2 pen 11  . Homeopathic Products (THERAWORX GLOVE + FOAM EX) Apply 1 application topically 2 (two) times daily as needed (leg cramps).    Marland Kitchen icosapent Ethyl (VASCEPA) 1 g capsule Take 2 capsules (2 g total) by mouth 2 (two) times daily. 120 capsule 5  . insulin glargine (LANTUS) 100 UNIT/ML injection Inject 50 Units into the skin at bedtime.     . insulin lispro (HUMALOG) 100 UNIT/ML KwikPen Inject 15 Units into the skin 2 (two) times daily.    . isosorbide mononitrate (IMDUR) 60 MG 24 hr tablet Take 1 tablet (60 mg total) by mouth daily. 90 tablet 3  . metoprolol tartrate (LOPRESSOR) 50 MG tablet Take 1 tablet (50 mg total) by mouth 2 (two) times daily. 180 tablet 3  . nitroGLYCERIN (NITROSTAT) 0.4 MG SL tablet Place 1 tablet (0.4 mg total) under the tongue every 5 (five) minutes as needed for chest pain. 90 tablet 3  . prochlorperazine (COMPAZINE) 10 MG tablet TAKE 1 TABLET BY MOUTH EVERY 6 HOURS AS NEEDED FOR NAUSEA AND VOMITING 30 tablet 0  . tetrahydrozoline 0.05 % ophthalmic solution Place 1 drop into both eyes at bedtime as needed (eye irritation).     No current facility-administered medications for this visit.    Allergies  Allergen Reactions  . Lisinopril Other (See Comments)    Severe cough Hair ball in throat  . Statins Hives and Other (See Comments)    Other reaction(s): Cough (ALLERGY/intolerance), Myalgias (intolerance) No cholesterol meds  . Atorvastatin Other (See Comments)    REACTION: myalgies  . Rosuvastatin Itching    Social History   Socioeconomic History  . Marital status: Married    Spouse name: Not on file  . Number of children: 4  . Years of education: Not on file  .  Highest education level: Not on file  Occupational History  . Occupation: [employer]    Employer: UNEMPLOYED  Tobacco Use  . Smoking status: Never Smoker  . Smokeless tobacco: Never Used  Substance and Sexual Activity  . Alcohol use: No  . Drug use: No  . Sexual activity: Not on file  Other Topics Concern  . Not on file  Social History Narrative   The patient lives in [place] with his wife. He works in Leisure centre manager. He denies tobacco or alcohol abuse . He denies regular exercise.   Social Determinants of Health   Financial Resource Strain:   .  Difficulty of Paying Living Expenses: Not on file  Food Insecurity:   . Worried About Charity fundraiser in the Last Year: Not on file  . Ran Out of Food in the Last Year: Not on file  Transportation Needs:   . Lack of Transportation (Medical): Not on file  . Lack of Transportation (Non-Medical): Not on file  Physical Activity:   . Days of Exercise per Week: Not on file  . Minutes of Exercise per Session: Not on file  Stress:   . Feeling of Stress : Not on file  Social Connections:   . Frequency of Communication with Friends and Family: Not on file  . Frequency of Social Gatherings with Friends and Family: Not on file  . Attends Religious Services: Not on file  . Active Member of Clubs or Organizations: Not on file  . Attends Archivist Meetings: Not on file  . Marital Status: Not on file  Intimate Partner Violence:   . Fear of Current or Ex-Partner: Not on file  . Emotionally Abused: Not on file  . Physically Abused: Not on file  . Sexually Abused: Not on file    Family History  Problem Relation Age of Onset  . Heart attack Father   . Hypertension Father   . Heart attack Brother     Review of Systems:  As stated in the HPI and otherwise negative.   BP (!) 142/76   Ht 6' (1.829 m)   Wt 239 lb 12.8 oz (108.8 kg)   BMI 32.52 kg/m   Physical Examination: General: Well  developed, well nourished, NAD  HEENT: OP clear, mucus membranes moist  SKIN: warm, dry. No rashes. Neuro: No focal deficits  Musculoskeletal: Muscle strength 5/5 all ext  Psychiatric: Mood and affect normal  Neck: No JVD, no carotid bruits, no thyromegaly, no lymphadenopathy.  Lungs:Clear bilaterally, no wheezes, rhonci, crackles Cardiovascular: Regular rate and rhythm. No murmurs, gallops or rubs. Abdomen:Soft. Bowel sounds present. Non-tender.  Extremities: No lower extremity edema. Pulses are 2 + in the bilateral DP/PT.  EKG is not performed today EKG is reviewed by me and shows   Cardiac cath 08/26/10: 1. Right coronary artery: The right coronary artery was a small,  nondominant vessel.  2. Left main: The left main was short with no angiographic coronary  artery disease.  3. Left circumflex: The left circumflex system was dominant. There  were luminal irregularities. There was a left-sided PDA as well as a  large PLOM.  4. LAD system: The LAD itself had luminal irregularities. There was  a moderate-sized first diagonal. The first diagonal had a drug-  eluting stent about midway down the vessel. This was patent,  however, there was a 99% stenosis just prior to the stent with TIMI  2 flow down the rest of the vessel. The stenosis extended almost  back, but not quite back to the ostium.  Echo November 2020:  1. Left ventricular ejection fraction, by visual estimation, is 60 to 65%. The left ventricle has normal function. There is mildly increased left ventricular hypertrophy.  2. Left ventricular diastolic parameters are indeterminate.  3. Global right ventricle has normal systolic function.The right ventricular size is normal. No increase in right ventricular wall thickness.  4. Left atrial size was moderately dilated.  5. Right atrial size was normal.  6. Trivial pericardial effusion is present.  7. The mitral valve is normal in structure. No evidence of mitral valve  regurgitation.  8. The  tricuspid valve is normal in structure. Tricuspid valve regurgitation is trivial.  9. There is Mild calcification of the aortic valve. 10. The aortic valve is tricuspid. Aortic valve regurgitation is not visualized. 11. The pulmonic valve was not well visualized. Pulmonic valve regurgitation is not visualized. 12. Normal pulmonary artery systolic pressure. 13. The inferior vena cava is normal in size with <50% respiratory variability, suggesting right atrial pressure of 8 mmHg.   Assessment and Plan:   1. CAD with unstable angina: Chest pain improved on Imdur and Metoprolol. Hopefully we can avoid a cardiac cath for now given his CKD. Continue ASA, Plavix, Imdur and metoprolol. I will see him back in 75months. He will call if his chest pain worsens prior to then.   2. HTN: BP is controlled.  3. HLD: He is on Repatha and Vascepa.    Current medicines are reviewed at length with the patient today.  The patient does not have concerns regarding medicines.  The following changes have been made:  no change  Labs/ tests ordered today include:  No orders of the defined types were placed in this encounter.    Disposition:   FU with me in 3 months   Signed, Lauree Chandler, MD 01/17/2019 4:27 PM    [facility] [address], [address], [place]  [address] Phone: [phone]; Fax: [phone]

## 2019-01-17 NOTE — Patient Instructions (Signed)
[facility] Discharge Instructions for Patients Receiving Chemotherapy  Today you received the following chemotherapy agents: gemcitabine and carboplatin.  To help prevent nausea and vomiting after your treatment, we encourage you to take your nausea medication as directed.   If you develop nausea and vomiting that is not controlled by your nausea medication, call the clinic.   BELOW ARE SYMPTOMS THAT SHOULD BE REPORTED IMMEDIATELY:  *FEVER GREATER THAN 100.5 F  *CHILLS WITH OR WITHOUT FEVER  NAUSEA AND VOMITING THAT IS NOT CONTROLLED WITH YOUR NAUSEA MEDICATION  *UNUSUAL SHORTNESS OF BREATH  *UNUSUAL BRUISING OR BLEEDING  TENDERNESS IN MOUTH AND THROAT WITH OR WITHOUT PRESENCE OF ULCERS  *URINARY PROBLEMS  *BOWEL PROBLEMS  UNUSUAL RASH Items with * indicate a potential emergency and should be followed up as soon as possible.  Feel free to call the clinic should you have any questions or concerns. The clinic phone number is [phone].  Please show the CHEMO ALERT CARD at check-in to the Emergency Department and triage nurse.   

## 2019-01-21 ENCOUNTER — Telehealth: Payer: Self-pay | Admitting: Oncology

## 2019-01-21 NOTE — Telephone Encounter (Signed)
Scheduled appt per 12/31 los. ° °Sent a message to HIM pool to get a calendar mailed out. °

## 2019-01-24 ENCOUNTER — Inpatient Hospital Stay: Payer: Medicare Other

## 2019-01-24 ENCOUNTER — Inpatient Hospital Stay: Payer: Medicare Other | Attending: Oncology

## 2019-01-24 ENCOUNTER — Other Ambulatory Visit: Payer: Self-pay

## 2019-01-24 VITALS — BP 158/75 | HR 72 | Temp 97.4°F | Resp 19 | Ht 72.0 in | Wt 235.1 lb

## 2019-01-24 DIAGNOSIS — C679 Malignant neoplasm of bladder, unspecified: Secondary | ICD-10-CM | POA: Diagnosis present

## 2019-01-24 DIAGNOSIS — Z5111 Encounter for antineoplastic chemotherapy: Secondary | ICD-10-CM | POA: Insufficient documentation

## 2019-01-24 DIAGNOSIS — C678 Malignant neoplasm of overlapping sites of bladder: Secondary | ICD-10-CM

## 2019-01-24 LAB — CMP (CANCER CENTER ONLY)
ALT: 47 U/L — ABNORMAL HIGH (ref 0–44)
AST: 32 U/L (ref 15–41)
Albumin: 3.7 g/dL (ref 3.5–5.0)
Alkaline Phosphatase: 84 U/L (ref 38–126)
Anion gap: 12 (ref 5–15)
BUN: 39 mg/dL — ABNORMAL HIGH (ref 8–23)
CO2: 22 mmol/L (ref 22–32)
Calcium: 8.9 mg/dL (ref 8.9–10.3)
Chloride: 102 mmol/L (ref 98–111)
Creatinine: 1.8 mg/dL — ABNORMAL HIGH (ref 0.61–1.24)
GFR, Est AFR Am: 44 mL/min — ABNORMAL LOW (ref >60)
GFR, Estimated: 38 mL/min — ABNORMAL LOW (ref >60)
Glucose, Bld: 260 mg/dL — ABNORMAL HIGH (ref 70–99)
Potassium: 4.8 mmol/L (ref 3.5–5.1)
Sodium: 136 mmol/L (ref 135–145)
Total Bilirubin: 0.3 mg/dL (ref 0.3–1.2)
Total Protein: 7 g/dL (ref 6.5–8.1)

## 2019-01-24 LAB — CBC WITH DIFFERENTIAL (CANCER CENTER ONLY)
Abs Immature Granulocytes: 0.03 10*3/uL (ref 0.00–0.07)
Basophils Absolute: 0 10*3/uL (ref 0.0–0.1)
Basophils Relative: 1 %
Eosinophils Absolute: 0 10*3/uL (ref 0.0–0.5)
Eosinophils Relative: 1 %
HCT: 27.6 % — ABNORMAL LOW (ref 39.0–52.0)
Hemoglobin: 9.6 g/dL — ABNORMAL LOW (ref 13.0–17.0)
Immature Granulocytes: 1 %
Lymphocytes Relative: 36 %
Lymphs Abs: 0.8 10*3/uL (ref 0.7–4.0)
MCH: 32.2 pg (ref 26.0–34.0)
MCHC: 34.8 g/dL (ref 30.0–36.0)
MCV: 92.6 fL (ref 80.0–100.0)
Monocytes Absolute: 0.3 10*3/uL (ref 0.1–1.0)
Monocytes Relative: 13 %
Neutro Abs: 1 10*3/uL — ABNORMAL LOW (ref 1.7–7.7)
Neutrophils Relative %: 48 %
Platelet Count: 130 10*3/uL — ABNORMAL LOW (ref 150–400)
RBC: 2.98 MIL/uL — ABNORMAL LOW (ref 4.22–5.81)
RDW: 16.1 % — ABNORMAL HIGH (ref 11.5–15.5)
WBC Count: 2.2 10*3/uL — ABNORMAL LOW (ref 4.0–10.5)
nRBC: 0 % (ref 0.0–0.2)

## 2019-01-24 MED ORDER — PROCHLORPERAZINE MALEATE 10 MG PO TABS
10.0000 mg | ORAL_TABLET | Freq: Once | ORAL | Status: AC
  Administered 2019-01-24: 10 mg via ORAL

## 2019-01-24 MED ORDER — PROCHLORPERAZINE MALEATE 10 MG PO TABS
ORAL_TABLET | ORAL | Status: AC
  Filled 2019-01-24: qty 1

## 2019-01-24 MED ORDER — SODIUM CHLORIDE 0.9 % IV SOLN
Freq: Once | INTRAVENOUS | Status: DC
  Filled 2019-01-24: qty 250

## 2019-01-24 MED ORDER — SODIUM CHLORIDE 0.9 % IV SOLN
Freq: Once | INTRAVENOUS | Status: AC
  Filled 2019-01-24: qty 250

## 2019-01-24 MED ORDER — SODIUM CHLORIDE 0.9 % IV SOLN
800.0000 mg/m2 | Freq: Once | INTRAVENOUS | Status: AC
  Administered 2019-01-24: 1900 mg via INTRAVENOUS
  Filled 2019-01-24: qty 49.97

## 2019-01-24 NOTE — Progress Notes (Signed)
Per Dr. Alen Blew: OK to treat with ANC 1.0 and creatinine 1.8

## 2019-01-24 NOTE — Patient Instructions (Signed)
[facility] Discharge Instructions for Patients Receiving Chemotherapy  Today you received the following chemotherapy agents: Gemcitabine (Gemzar)  To help prevent nausea and vomiting after your treatment, we encourage you to take your nausea medication as directed.   If you develop nausea and vomiting that is not controlled by your nausea medication, call the clinic.   BELOW ARE SYMPTOMS THAT SHOULD BE REPORTED IMMEDIATELY:  *FEVER GREATER THAN 100.5 F  *CHILLS WITH OR WITHOUT FEVER  NAUSEA AND VOMITING THAT IS NOT CONTROLLED WITH YOUR NAUSEA MEDICATION  *UNUSUAL SHORTNESS OF BREATH  *UNUSUAL BRUISING OR BLEEDING  TENDERNESS IN MOUTH AND THROAT WITH OR WITHOUT PRESENCE OF ULCERS  *URINARY PROBLEMS  *BOWEL PROBLEMS  UNUSUAL RASH Items with * indicate a potential emergency and should be followed up as soon as possible.  Feel free to call the clinic should you have any questions or concerns. The clinic phone number is [phone].  Please show the CHEMO ALERT CARD at check-in to the Emergency Department and triage nurse.  Coronavirus (COVID-19) Are you at risk?  Are you at risk for the Coronavirus (COVID-19)?  To be considered HIGH RISK for Coronavirus (COVID-19), you have to meet the following criteria:  . Traveled to [place], [place], [place], [place] or [place]; or in the [place] to [place], [place], [place], or [place]; and have fever, cough, and shortness of breath within the last 2 weeks of travel OR . Been in close contact with a person diagnosed with COVID-19 within the last 2 weeks and have fever, cough, and shortness of breath . IF YOU DO NOT MEET THESE CRITERIA, YOU ARE CONSIDERED LOW RISK FOR COVID-19.  What to do if you are HIGH RISK for COVID-19?  . If you are having a medical emergency, call 911. . Seek medical care right away. Before you go to a doctor's office, urgent care or emergency department, call ahead and tell them  about your recent travel, contact with someone diagnosed with COVID-19, and your symptoms. You should receive instructions from your physician's office regarding next steps of care.  . When you arrive at healthcare provider, tell the healthcare staff immediately you have returned from visiting [place], [place], [place], [place] or [place]; or traveled in the [place] to [place], [place], [place], or [place]; in the last two weeks or you have been in close contact with a person diagnosed with COVID-19 in the last 2 weeks.   . Tell the health care staff about your symptoms: fever, cough and shortness of breath. . After you have been seen by a medical provider, you will be either: o Tested for (COVID-19) and discharged home on quarantine except to seek medical care if symptoms worsen, and asked to  - Stay home and avoid contact with others until you get your results (4-5 days)  - Avoid travel on public transportation if possible (such as bus, train, or airplane) or o Sent to the Emergency Department by EMS for evaluation, COVID-19 testing, and possible admission depending on your condition and test results.  What to do if you are LOW RISK for COVID-19?  Reduce your risk of any infection by using the same precautions used for avoiding the common cold or flu:  . Wash your hands often with soap and warm water for at least 20 seconds.  If soap and water are not readily available, use an alcohol-based hand sanitizer with at least 60% alcohol.  . If coughing or   sneezing, cover your mouth and nose by coughing or sneezing into the elbow areas of your shirt or coat, into a tissue or into your sleeve (not your hands). . Avoid shaking hands with others and consider head nods or verbal greetings only. . Avoid touching your eyes, nose, or mouth with unwashed hands.  . Avoid close contact with people who are sick. . Avoid places or events with large numbers of people in one location, like concerts or  sporting events. . Carefully consider travel plans you have or are making. . If you are planning any travel outside or inside the [place], visit the CDC's Travelers' Health webpage for the latest health notices. . If you have some symptoms but not all symptoms, continue to monitor at home and seek medical attention if your symptoms worsen. . If you are having a medical emergency, call 911.   ADDITIONAL HEALTHCARE OPTIONS FOR PATIENTS  [facility] / e-Visit: [email or web address]         [facility]: [phone]  [facility]: [phone]                   [facility]: [phone]   

## 2019-01-31 ENCOUNTER — Telehealth: Payer: Self-pay

## 2019-01-31 NOTE — Telephone Encounter (Signed)
Called patient and let him know Dr. Hazeline Junker response below. Patient verbalized understanding.

## 2019-01-31 NOTE — Telephone Encounter (Signed)
-----   Message from Wyatt Portela, MD sent at 01/31/2019 11:04 AM EST ----- Please let him know the next PET scan will help as well.  The previous one did not show anything specific. ----- Message ----- From: Tami Lin, RN Sent: 01/31/2019  10:51 AM EST To: Wyatt Portela, MD  Patient called and said he forgot to tell you at his last visit about pain on his left side at his rib cage. He states he has the pain every morning and it last for 2-3 hours. He states when he is having the pain it gets to 10/10 and sometimes he has to sit down and is doubled over in pain. He states he is fine after 2-3 hours and doesn't have any more pain. Only experiences pain in the mornings. I asked him long this has been going on and he said about a one year.  He wants to know if there was anything that showed up on his last PET scan that could be causing the pain.  Lanelle Bal

## 2019-02-01 ENCOUNTER — Telehealth: Payer: Self-pay | Admitting: Cardiovascular Disease

## 2019-02-01 NOTE — Telephone Encounter (Signed)
Talked to patient about his message. Patient stated he had chest pain last night around 8:00 pm and took 2 nitro tablets. Patient stated he felt better after taking two at one time. Informed patient that nitro is suppose to be taken one at a time 5 minutes apart and only should take up to 3, no more. Patient stated he has no way of taking his HR or BP. Patient stated he felt like he had a blockage and after taking nitroglycerin it broke off and he felt better. Patient stated he feels very tired with activity as well. Will consult, Dr. [name] for advisement.

## 2019-02-01 NOTE — Telephone Encounter (Signed)
Called patient back about Dr. Camillia Herter recommendations. Patient stated he will see how he does over the weekend and give our office a call on Monday if he feels like he needs to be seen. Will send message to Dr. Camillia Herter nurse, so she is aware if patient calls back.

## 2019-02-01 NOTE — Telephone Encounter (Signed)
  Pt c/o of Chest Pain: STAT if CP now or developed within 24 hours  1. Are you having CP right now? no  2. Are you experiencing any other symptoms (ex. SOB, nausea, vomiting, sweating)? arms tingling down to the wrist last night, weak now  3. How long have you been experiencing CP? Last night.  4. Is your CP continuous or coming and going? Started last night and then stopped.  5. Have you taken Nitroglycerin? Yes. Had to take two at a time. ? Patient states he believes the blockage in his artery broke loose last night. He states he felt a strong pain and burning. He states he is not in pain now, but feels very weak. He would like to know if he needs to be seen about this.

## 2019-02-01 NOTE — Telephone Encounter (Signed)
I saw him recently. We discussed attempting medical therapy and avoiding cath due to his chronic kidney disease. I would be willing to discuss a cardiac cath if he is having more chest pain. I would advise him to call us back if he has any further chest pain. If that occurs, he should take ntG every 5 minutes x 3. If the pain does not resolve with NTG, call 911. I would be glad to see him back next week in the office if he would like.   Thanks, Gerald Stabs

## 2019-02-03 ENCOUNTER — Other Ambulatory Visit: Payer: Self-pay

## 2019-02-03 ENCOUNTER — Encounter (HOSPITAL_COMMUNITY): Payer: Self-pay | Admitting: *Deleted

## 2019-02-03 ENCOUNTER — Emergency Department (HOSPITAL_COMMUNITY): Payer: Medicare Other

## 2019-02-03 ENCOUNTER — Inpatient Hospital Stay (HOSPITAL_COMMUNITY)
Admission: EM | Admit: 2019-02-03 | Discharge: 2019-02-05 | DRG: 811 | Disposition: A | Discharge status: Home / Self Care | Payer: Medicare Other | Attending: Internal Medicine | Admitting: Internal Medicine

## 2019-02-03 DIAGNOSIS — N1832 Chronic kidney disease, stage 3b: Secondary | ICD-10-CM | POA: Diagnosis present

## 2019-02-03 DIAGNOSIS — E1122 Type 2 diabetes mellitus with diabetic chronic kidney disease: Secondary | ICD-10-CM | POA: Diagnosis present

## 2019-02-03 DIAGNOSIS — D6481 Anemia due to antineoplastic chemotherapy: Secondary | ICD-10-CM | POA: Diagnosis present

## 2019-02-03 DIAGNOSIS — N189 Chronic kidney disease, unspecified: Secondary | ICD-10-CM

## 2019-02-03 DIAGNOSIS — Z981 Arthrodesis status: Secondary | ICD-10-CM

## 2019-02-03 DIAGNOSIS — Z955 Presence of coronary angioplasty implant and graft: Secondary | ICD-10-CM

## 2019-02-03 DIAGNOSIS — Z794 Long term (current) use of insulin: Secondary | ICD-10-CM | POA: Diagnosis not present

## 2019-02-03 DIAGNOSIS — Z7902 Long term (current) use of antithrombotics/antiplatelets: Secondary | ICD-10-CM | POA: Diagnosis not present

## 2019-02-03 DIAGNOSIS — E7800 Pure hypercholesterolemia, unspecified: Secondary | ICD-10-CM | POA: Diagnosis present

## 2019-02-03 DIAGNOSIS — Z96653 Presence of artificial knee joint, bilateral: Secondary | ICD-10-CM | POA: Diagnosis present

## 2019-02-03 DIAGNOSIS — C679 Malignant neoplasm of bladder, unspecified: Secondary | ICD-10-CM | POA: Diagnosis present

## 2019-02-03 DIAGNOSIS — D696 Thrombocytopenia, unspecified: Secondary | ICD-10-CM | POA: Diagnosis present

## 2019-02-03 DIAGNOSIS — Z888 Allergy status to other drugs, medicaments and biological substances status: Secondary | ICD-10-CM

## 2019-02-03 DIAGNOSIS — Z20822 Contact with and (suspected) exposure to covid-19: Secondary | ICD-10-CM | POA: Diagnosis present

## 2019-02-03 DIAGNOSIS — D649 Anemia, unspecified: Secondary | ICD-10-CM

## 2019-02-03 DIAGNOSIS — I25110 Atherosclerotic heart disease of native coronary artery with unstable angina pectoris: Secondary | ICD-10-CM | POA: Diagnosis present

## 2019-02-03 DIAGNOSIS — Z8249 Family history of ischemic heart disease and other diseases of the circulatory system: Secondary | ICD-10-CM

## 2019-02-03 DIAGNOSIS — Z7982 Long term (current) use of aspirin: Secondary | ICD-10-CM | POA: Diagnosis not present

## 2019-02-03 DIAGNOSIS — E1151 Type 2 diabetes mellitus with diabetic peripheral angiopathy without gangrene: Secondary | ICD-10-CM | POA: Diagnosis present

## 2019-02-03 DIAGNOSIS — K769 Liver disease, unspecified: Secondary | ICD-10-CM | POA: Diagnosis present

## 2019-02-03 DIAGNOSIS — N289 Disorder of kidney and ureter, unspecified: Secondary | ICD-10-CM

## 2019-02-03 DIAGNOSIS — I214 Non-ST elevation (NSTEMI) myocardial infarction: Secondary | ICD-10-CM | POA: Diagnosis present

## 2019-02-03 DIAGNOSIS — I21A1 Myocardial infarction type 2: Secondary | ICD-10-CM | POA: Diagnosis present

## 2019-02-03 DIAGNOSIS — I200 Unstable angina: Secondary | ICD-10-CM

## 2019-02-03 DIAGNOSIS — I130 Hypertensive heart and chronic kidney disease with heart failure and stage 1 through stage 4 chronic kidney disease, or unspecified chronic kidney disease: Secondary | ICD-10-CM | POA: Diagnosis present

## 2019-02-03 DIAGNOSIS — K219 Gastro-esophageal reflux disease without esophagitis: Secondary | ICD-10-CM | POA: Diagnosis present

## 2019-02-03 DIAGNOSIS — I5021 Acute systolic (congestive) heart failure: Secondary | ICD-10-CM | POA: Diagnosis present

## 2019-02-03 DIAGNOSIS — E1121 Type 2 diabetes mellitus with diabetic nephropathy: Secondary | ICD-10-CM | POA: Diagnosis not present

## 2019-02-03 DIAGNOSIS — T451X5A Adverse effect of antineoplastic and immunosuppressive drugs, initial encounter: Secondary | ICD-10-CM | POA: Diagnosis present

## 2019-02-03 DIAGNOSIS — N179 Acute kidney failure, unspecified: Secondary | ICD-10-CM | POA: Diagnosis present

## 2019-02-03 DIAGNOSIS — I361 Nonrheumatic tricuspid (valve) insufficiency: Secondary | ICD-10-CM | POA: Diagnosis not present

## 2019-02-03 DIAGNOSIS — I219 Acute myocardial infarction, unspecified: Secondary | ICD-10-CM | POA: Diagnosis not present

## 2019-02-03 DIAGNOSIS — E785 Hyperlipidemia, unspecified: Secondary | ICD-10-CM | POA: Diagnosis present

## 2019-02-03 DIAGNOSIS — I340 Nonrheumatic mitral (valve) insufficiency: Secondary | ICD-10-CM | POA: Diagnosis not present

## 2019-02-03 DIAGNOSIS — D61810 Antineoplastic chemotherapy induced pancytopenia: Secondary | ICD-10-CM | POA: Diagnosis not present

## 2019-02-03 HISTORY — DX: Malignant (primary) neoplasm, unspecified: C80.1

## 2019-02-03 LAB — PREPARE RBC (CROSSMATCH)

## 2019-02-03 LAB — RESPIRATORY PANEL BY RT PCR (FLU A&B, COVID)
Influenza A by PCR: NEGATIVE
Influenza B by PCR: NEGATIVE
SARS Coronavirus 2 by RT PCR: NEGATIVE

## 2019-02-03 LAB — BASIC METABOLIC PANEL
Anion gap: 12 (ref 5–15)
BUN: 36 mg/dL — ABNORMAL HIGH (ref 8–23)
CO2: 22 mmol/L (ref 22–32)
Calcium: 9.1 mg/dL (ref 8.9–10.3)
Chloride: 105 mmol/L (ref 98–111)
Creatinine, Ser: 1.99 mg/dL — ABNORMAL HIGH (ref 0.61–1.24)
GFR calc Af Amer: 39 mL/min — ABNORMAL LOW (ref >60)
GFR calc non Af Amer: 33 mL/min — ABNORMAL LOW (ref >60)
Glucose, Bld: 157 mg/dL — ABNORMAL HIGH (ref 70–99)
Potassium: 4.4 mmol/L (ref 3.5–5.1)
Sodium: 139 mmol/L (ref 135–145)

## 2019-02-03 LAB — CBC
HCT: 20.6 % — ABNORMAL LOW (ref 39.0–52.0)
Hemoglobin: 6.9 g/dL — CL (ref 13.0–17.0)
MCH: 31.7 pg (ref 26.0–34.0)
MCHC: 33.5 g/dL (ref 30.0–36.0)
MCV: 94.5 fL (ref 80.0–100.0)
Platelets: 47 10*3/uL — ABNORMAL LOW (ref 150–400)
RBC: 2.18 MIL/uL — ABNORMAL LOW (ref 4.22–5.81)
RDW: 15.9 % — ABNORMAL HIGH (ref 11.5–15.5)
WBC: 4.3 10*3/uL (ref 4.0–10.5)
nRBC: 0.5 % — ABNORMAL HIGH (ref 0.0–0.2)

## 2019-02-03 LAB — PROTIME-INR
INR: 1.2 (ref 0.8–1.2)
Prothrombin Time: 15.2 seconds (ref 11.4–15.2)

## 2019-02-03 LAB — TROPONIN I (HIGH SENSITIVITY): Troponin I (High Sensitivity): 954 ng/L (ref <18)

## 2019-02-03 LAB — POC OCCULT BLOOD, ED: Fecal Occult Bld: NEGATIVE

## 2019-02-03 MED ORDER — SODIUM CHLORIDE 0.9% FLUSH: 3.0000 mL | Freq: Once | INTRAVENOUS | Status: DC

## 2019-02-03 MED ORDER — SODIUM CHLORIDE 0.9% IV SOLUTION: Freq: Once | INTRAVENOUS | Status: DC

## 2019-02-03 NOTE — ED Notes (Signed)
Date and time results received: 02/03/19 2038   Test: Trop Critical Value: 798 Name of Provider Notified: Melina Copa

## 2019-02-03 NOTE — ED Notes (Signed)
Date and time results received: 02/03/19 8:05 PM  Test: CBC hemoglobin Critical Value: 6.9  Name of Provider Notified: Melina Copa

## 2019-02-03 NOTE — H&P (Signed)
History and Physical    Chad Yu [id number] DOB: 1950/01/29 DOA: 02/03/2019  PCP: [name], MD Patient coming from: Home  Chief Complaint: Chest pain  HPI: Chad Yu is a 69 y.o. male with medical history significant of CAD status post PCI, hypertension, hyperlipidemia, bladder cancer currently on chemo, type 2 diabetes presenting to the ED for evaluation of chest pain. Patient reports having substernal chest pressure with exertion for the past few months.  Every time symptoms resolve in about 10 minutes after he rests and takes nitroglycerin.  Symptoms have been getting progressively worse.  Now he is having chest pain even with minimal exertion.  Today he was trying to put wood into the fireplace and experienced substernal chest pressure again.  No associated dyspnea, diaphoresis, or nausea.  He took nitroglycerin and isosorbide at home which made him feel better.  At present, he is no longer having chest pain.  States he has a history of bladder cancer which has been excised and is currently receiving chemotherapy.  His oncologist is Dr. Alen Blew.  He has been told he is anemic due to receiving chemotherapy and has received several blood transfusions in the past few months.  He has also been told that his platelet count is low.  Denies hematemesis, hematochezia, or melena.  States his last colonoscopy was 2 years ago and he was told it was normal.  ED Course: Hemodynamically stable.  High-sensitivity troponin 798 >954.  EKG with new ST depressions and T wave inversions in inferior and lateral leads.  Hemoglobin 6.9, previously in the 9 range.  FOBT negative.  Platelet count was previously mildly low but now significantly worse at 47,000.  Creatinine 1.9, no significant change compared to recent labs.  SARS-CoV-2 PCR test negative.  Chest x-ray showing no active cardiopulmonary disease.  Cardiology consulted.  2 units PRBCs ordered.  Review of Systems:  All systems reviewed and apart  from history of presenting illness, are negative.  Past Medical History:  Diagnosis Date  . Bladder tumor   . Coronary artery disease cardiologist-- dr [name] (per pt last office visit 10/ 2018)   s/p  PCI w/ DES x1 to diagonal branch of LAD  . DDD (degenerative disc disease), lumbar   . Degenerative joint disease   . History of concussion    per pt age 11 w/ brief loc--- no residual  . Hyperlipidemia   . Hypertension   . OA (osteoarthritis)   . S/P drug eluting coronary stent placement 10/24/2007   diagonal branch of LAD  . Type 2 diabetes mellitus treated with insulin ([place])    followed by pcp  . Varicose vein of leg   . Wears glasses     Past Surgical History:  Procedure Laterality Date  . APPENDECTOMY  age 35  . COLONOSCOPY  2012  . CORONARY ANGIOPLASTY  08-26-2010   dr [name]   previous stent patent,  proximal prior to previous stent 99% stenosis;  Unsuccessful multiple attempts PCI, unability to cross with a ballon;  medical management  . CORONARY ANGIOPLASTY WITH STENT PLACEMENT  10-23-2017  dr Darnell Level brodie   positive ischemia on myoview;   40% pLAD and D1 80%,  diagonal branch treated w/ PCI and DES   . EXCISION MIDLINE SUPERIOR THYOID ISTHMUS MASS  11-27-2008   dr Ernesto Rutherford   bening lipoma  . KNEE ARTHROSCOPY Left 01-22-2004   dr Percell Miller  . POSTERIOR LUMBAR FUSION  11-14-2012   dr Christella Noa @[facility]    L3 --  5  . SHOULDER ARTHROSCOPY Right 2019  . SHOULDER ARTHROSCOPY WITH ROTATOR CUFF REPAIR AND SUBACROMIAL DECOMPRESSION Left 11-12-2009   dr Percell Miller  @[facility]    w/ Labral debridement and DCR  . TONSILLECTOMY  age 52  . TOTAL KNEE ARTHROPLASTY Left 02/17/2016   Procedure: TOTAL KNEE ARTHROPLASTY;  Surgeon: Ninetta Lights, MD;  Location: [facility];  Service: Orthopedics;  Laterality: Left;  . TOTAL KNEE ARTHROPLASTY Right 02-25-2004   dr Percell Miller  @[facility]   . TRANSURETHRAL RESECTION OF BLADDER TUMOR N/A 08/31/2018   Procedure: TRANSURETHRAL RESECTION OF BLADDER TUMOR (TURBT) WITH  INSTILLATION OF POST OPERATIVE CHEMOTHERAPY;  Surgeon: Kathie Rhodes, MD;  Location: [facility];  Service: Urology;  Laterality: N/A;  . [place]     reports that he has never smoked. He has never used smokeless tobacco. He reports that he does not drink alcohol or use drugs.  Allergies  Allergen Reactions  . Lisinopril Other (See Comments)    Severe cough Hair ball in throat  . Statins Hives and Other (See Comments)    Other reaction(s): Cough (ALLERGY/intolerance), Myalgias (intolerance) No cholesterol meds  . Atorvastatin Other (See Comments)    REACTION: myalgies  . Rosuvastatin Itching    Family History  Problem Relation Age of Onset  . Heart attack Father   . Hypertension Father   . Heart attack Brother     Prior to Admission medications   Medication Sig Start Date End Date Taking? Authorizing Provider  amLODipine (NORVASC) 5 MG tablet Take 1 tablet (5 mg total) by mouth daily. 11/26/18   Geradine Girt, DO  aspirin EC 81 MG EC tablet Take 1 tablet (81 mg total) by mouth daily. 11/26/18   Geradine Girt, DO  clopidogrel (PLAVIX) 75 MG tablet Take 1 tablet (75 mg total) by mouth daily. 12/10/18   Burtis Junes, NP  diclofenac sodium (VOLTAREN) 1 % GEL Apply 1 application topically at bedtime as needed (pain.).    [provider]  Dulaglutide (TRULICITY) 1.5 0000000 SOPN Inject 1.5 mg into the skin every Sunday.     [provider]  empagliflozin (JARDIANCE) 25 MG TABS tablet Take 25 mg by mouth daily.    [provider]  Evolocumab (REPATHA SURECLICK) [id number] MG/ML SOAJ Inject 1 pen into the skin every 14 (fourteen) days. 11/26/18   Burnell Blanks, MD  Homeopathic Products (THERAWORX GLOVE + FOAM EX) Apply 1 application topically 2 (two) times daily as needed (leg cramps).    [provider]  icosapent Ethyl (VASCEPA) 1 g capsule Take 2 capsules (2 g total) by mouth 2 (two) times daily. 12/10/18   Sherren Mocha, MD   insulin glargine (LANTUS) 100 UNIT/ML injection Inject 50 Units into the skin at bedtime.     [provider]  insulin lispro (HUMALOG) 100 UNIT/ML KwikPen Inject 15 Units into the skin 2 (two) times daily. 09/19/18   [provider]  isosorbide mononitrate (IMDUR) 60 MG 24 hr tablet Take 1 tablet (60 mg total) by mouth daily. 01/02/19 04/02/19  Burtis Junes, NP  metoprolol tartrate (LOPRESSOR) 50 MG tablet Take 1 tablet (50 mg total) by mouth 2 (two) times daily. 01/02/19 04/02/19  Burtis Junes, NP  nitroGLYCERIN (NITROSTAT) 0.4 MG SL tablet Place 1 tablet (0.4 mg total) under the tongue every 5 (five) minutes as needed for chest pain. 12/10/18 03/10/19  Burtis Junes, NP  prochlorperazine (COMPAZINE) 10 MG tablet TAKE 1 TABLET BY MOUTH  EVERY 6 HOURS AS NEEDED FOR NAUSEA AND VOMITING 11/12/18   Wyatt Portela, MD  tetrahydrozoline 0.05 % ophthalmic solution Place 1 drop into both eyes at bedtime as needed (eye irritation).    [provider]  fenofibrate (TRICOR) 145 MG tablet Take 145 mg by mouth daily.    08/02/18  [provider]    Physical Exam: Vitals:   02/04/19 0032 02/04/19 0036 02/04/19 0100 02/04/19 0143  BP: 111/68 111/68 112/63 135/70  Pulse: 73 73 69 78  Resp: (!) 22 (!) 22 17   Temp: 97.9 F (36.6 C) 97.9 F (36.6 C)  98.2 F (36.8 C)  TempSrc:  Oral  Oral  SpO2: 99% 99% 96%   Weight:      Height:        Physical Exam  Constitutional: He is oriented to person, place, and time. He appears well-developed and well-nourished. No distress.  HENT:  Head: Normocephalic.  Eyes: Right eye exhibits no discharge. Left eye exhibits no discharge.  Cardiovascular: Normal rate, regular rhythm and intact distal pulses.  Pulmonary/Chest: Effort normal and breath sounds normal. No respiratory distress. He has no wheezes. He has no rales.  Abdominal: Soft. Bowel sounds are normal. He exhibits no distension. There is no abdominal tenderness.  There is no guarding.  Musculoskeletal:        General: No edema.     Cervical back: Neck supple.  Neurological: He is alert and oriented to person, place, and time.  Skin: Skin is warm and dry. He is not diaphoretic.     Labs on Admission: I have personally reviewed following labs and imaging studies  CBC: Recent Labs  Lab 02/03/19 1938  WBC 4.3  HGB 6.9*  HCT 20.6*  MCV 94.5  PLT 47*   Basic Metabolic Panel: Recent Labs  Lab 02/03/19 1938  NA 139  K 4.4  CL 105  CO2 22  GLUCOSE 157*  BUN 36*  CREATININE 1.99*  CALCIUM 9.1   GFR: Estimated Creatinine Clearance: 44.1 mL/min (A) (by C-G formula based on SCr of 1.99 mg/dL (H)). Liver Function Tests: No results for input(s): AST, ALT, ALKPHOS, BILITOT, PROT, ALBUMIN in the last 168 hours. No results for input(s): LIPASE, AMYLASE in the last 168 hours. No results for input(s): AMMONIA in the last 168 hours. Coagulation Profile: Recent Labs  Lab 02/03/19 2009  INR 1.2   Cardiac Enzymes: No results for input(s): CKTOTAL, CKMB, CKMBINDEX, TROPONINI in the last 168 hours. BNP (last 3 results) No results for input(s): PROBNP in the last 8760 hours. HbA1C: No results for input(s): HGBA1C in the last 72 hours. CBG: No results for input(s): GLUCAP in the last 168 hours. Lipid Profile: No results for input(s): CHOL, HDL, LDLCALC, TRIG, CHOLHDL, LDLDIRECT in the last 72 hours. Thyroid Function Tests: No results for input(s): TSH, T4TOTAL, FREET4, T3FREE, THYROIDAB in the last 72 hours. Anemia Panel: No results for input(s): VITAMINB12, FOLATE, FERRITIN, TIBC, IRON, RETICCTPCT in the last 72 hours. Urine analysis:    Component Value Date/Time   COLORURINE YELLOW 11/26/2008 [place] 11/26/2008 0947   LABSPEC 1.018 11/26/2008 0947   PHURINE 5.0 11/26/2008 0947   GLUCOSEU 100 (A) 11/26/2008 0947   HGBUR NEGATIVE 11/26/2008 [place] 11/26/2008 [place] 11/26/2008  [place] 11/26/2008 0947   UROBILINOGEN 0.2 11/26/2008 0947   NITRITE NEGATIVE 11/26/2008 0947   LEUKOCYTESUR  11/26/2008 0947    NEGATIVE  MICROSCOPIC NOT DONE ON URINES WITH NEGATIVE PROTEIN, BLOOD, LEUKOCYTES, NITRITE, OR GLUCOSE <1000 mg/dL.    Radiological Exams on Admission: DG Chest Portable 1 View  Result Date: 02/03/2019 CLINICAL DATA:  69 year old male with history of intermittent chest pain for the past 2-3 months. EXAM: PORTABLE CHEST 1 VIEW COMPARISON:  Chest x-ray 11/22/2018. FINDINGS: Lung volumes are low. No consolidative airspace disease. No pleural effusions. No pneumothorax. No pulmonary nodule or mass noted. Pulmonary vasculature and the cardiomediastinal silhouette are within normal limits. Aortic atherosclerosis. IMPRESSION: 1.  No radiographic evidence of acute cardiopulmonary disease. 2. Aortic atherosclerosis. Electronically Signed   By: Vinnie Langton M.D.   On: 02/03/2019 20:50    EKG: Independently reviewed.  Sinus rhythm, first-degree AV block.  New ST depressions and T wave inversions in inferior and lateral leads.  Assessment/Plan Principal Problem:   NSTEMI (non-ST elevated myocardial infarction) [facility]) Active Problems:   Bladder cancer (HCC)   Anemia   Thrombocytopenia (HCC)   Renal insufficiency   NSTEMI Known history of CAD status post PCI.  Patient presenting with worsening chronic stable angina.  Hemodynamically stable.  High-sensitivity troponin 798 >954.  EKG with new ST depressions and T wave inversions in inferior and lateral leads.  Also has worsening anemia.  Cardiology has seen the patient and recommending giving blood transfusions at this time.  Unable to give heparin due to anemia and significant thrombocytopenia.  If patient remains symptomatic, cardiology will consider cardiac cath although it would put his kidneys at risk baseline renal insufficiency. -Cardiac monitoring -Blood transfusions for anemia -Trend  troponin -Sublingual nitroglycerin as needed -Echocardiogram -Continue home metoprolol, Imdur -Hold aspirin and Plavix at this time -If he continues to have uncontrolled angina, add ranolazine to his medication regimen  Acute on chronic anemia Suspect related to bone marrow suppression from chemo given concurrent thrombocytopenia.  Acute GI bleed less likely given negative FOBT and patient not endorsing any symptoms of GI bleed.  Hemoglobin 6.9, previously in the 9 range. -Type and screen -2 units PRBCs -Monitor H&H  Worsening thrombocytopenia Suspect related to bone marrow suppression from chemo given concurrent anemia.  Platelet count 47,000.  No signs of active bleeding. -Continue to monitor platelet count  Renal insufficiency Creatinine 1.9, no significant change compared to recent labs.  It seems patient's renal function was previously normal and has deteriorated since his cancer diagnosis. -Continue to monitor renal function  Bladder cancer Diagnosed in August 2020.  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  Followed by Dr. Alen Blew.  Currently on chemo. -Discuss giving erythropoiesis and thrombopoiesis stimulating agents with oncology in a.m.  Insulin-dependent diabetes mellitus -Check A1c.  Sliding scale insulin sensitive before meals and CBG checks. -Continue home insulin lispro 15 units twice daily  DVT prophylaxis: SCDs Code Status: Patient wishes to be full code. Family Communication: No family available at this time. Disposition Plan: Anticipate discharge after clinical improvement. Consults called: Cardiology Admission status: It is my clinical opinion that admission to [place] is reasonable and necessary in this 69 y.o. male . presenting with NSTEMI, acute on chronic anemia, and worsening thrombocytopenia.  Very high risk of decompensation.  Given the aforementioned, the predictability of an adverse outcome is  felt to be significant. I expect that the patient will require at least 2 midnights in the hospital to treat this condition.   The medical decision making on this patient was of high complexity and the patient is at high risk for clinical  deterioration, therefore this is a level 3 visit.  Shela Leff MD [facility]  If 7PM-7AM, please contact night-coverage [email or web address] Password [email or web address]  02/04/2019, 2:04 AM

## 2019-02-03 NOTE — ED Notes (Signed)
Electronic blood consent obtained.  
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
## 2019-02-03 NOTE — ED Provider Notes (Signed)
[facility] EMERGENCY DEPARTMENT Provider Note   CSN: [id number] Arrival date & time: 02/03/19  1906     History Chief Complaint  Patient presents with  . Chest Pain    Courtlan Abdulrahman is a 69 y.o. male.  He has a history of bladder cancer and is on chemotherapy.  He also has known coronary disease and is followed by Dr. [name].  It sounds like he has had stable angina for months but he says over the past 2 weeks he has been having more chest discomfort with any exertion and shortness of breath.  He took 3 nitro today for his discomfort.  He says it is mostly resolved, 2 out of 10 substernal.  It is associated with bilateral arm achiness.  Denies any illness symptoms fevers cough abdominal pain nausea vomiting diarrhea.  The history is provided by the patient.  Chest Pain Pain location:  Substernal area Pain quality: aching   Pain radiates to:  L arm and R arm Pain severity:  Mild Onset quality:  Gradual Timing:  Intermittent Progression:  Waxing and waning Chronicity:  Recurrent Relieved by:  Nitroglycerin Worsened by:  Exertion Ineffective treatments:  Nitroglycerin Associated symptoms: shortness of breath   Associated symptoms: no abdominal pain, no back pain, no claudication, no cough, no diaphoresis, no fever and no headache     HPI: A 69 year old patient with a history of peripheral artery disease, treated diabetes, hypertension and hypercholesterolemia presents for evaluation of chest pain. Initial onset of pain was approximately 3-6 hours ago. The patient's chest pain is described as heaviness/pressure/tightness, is worse with exertion and is relieved by nitroglycerin. The patient's chest pain is middle- or left-sided, is not well-localized, is not sharp and does radiate to the arms/jaw/neck. The patient does not complain of nausea and denies diaphoresis. The patient has no history of stroke, has not smoked in the past 90 days, has no relevant family  history of coronary artery disease (first degree relative at less than age 63) and does not have an elevated BMI (>=30).   Past Medical History:  Diagnosis Date  . Bladder tumor   . Coronary artery disease cardiologist-- dr [name] (per pt last office visit 10/ 2018)   s/p  PCI w/ DES x1 to diagonal branch of LAD  . DDD (degenerative disc disease), lumbar   . Degenerative joint disease   . History of concussion    per pt age 40 w/ brief loc--- no residual  . Hyperlipidemia   . Hypertension   . OA (osteoarthritis)   . S/P drug eluting coronary stent placement 10/24/2007   diagonal branch of LAD  . Type 2 diabetes mellitus treated with insulin ([place])    followed by pcp  . Varicose vein of leg   . Wears glasses     Patient Active Problem List   Diagnosis Date Noted  . Statin myopathy 01/10/2019  . Unstable angina ([place]) 11/23/2018  . Chest pain 11/22/2018  . Goals of care, counseling/discussion 09/19/2018  . Bladder cancer ([place]) 09/19/2018  . Primary localized osteoarthritis of left knee 02/17/2016  . Bradycardia 12/16/2010  . DIZZINESS 04/27/2009  . CHEST PAIN 04/27/2009  . ABNORMAL ELECTROCARDIOGRAM 04/27/2009  . ABDOMINAL PAIN-RUQ 10/30/2008  . GASTRITIS 06/13/2008  . GERD 06/05/2008  . SWELLING MASS OR LUMP IN HEAD AND NECK 06/05/2008  . COUGH 06/05/2008  . HEMORRHOIDS 06/04/2008  . CAD, NATIVE VESSEL 04/09/2008  . DM 04/07/2008  . Hyperlipidemia 04/07/2008  . Essential hypertension 04/07/2008  .  FATIGUE / MALAISE 04/07/2008  . DYSPNEA 04/07/2008    Past Surgical History:  Procedure Laterality Date  . APPENDECTOMY  age 43  . COLONOSCOPY  2012  . CORONARY ANGIOPLASTY  08-26-2010   dr [name]   previous stent patent,  proximal prior to previous stent 99% stenosis;  Unsuccessful multiple attempts PCI, unability to cross with a ballon;  medical management  . CORONARY ANGIOPLASTY WITH STENT PLACEMENT  10-23-2017  dr Darnell Level brodie   positive ischemia on myoview;   40%  pLAD and D1 80%,  diagonal branch treated w/ PCI and DES   . EXCISION MIDLINE SUPERIOR THYOID ISTHMUS MASS  11-27-2008   dr Ernesto Rutherford   bening lipoma  . KNEE ARTHROSCOPY Left 01-22-2004   dr Percell Miller  . POSTERIOR LUMBAR FUSION  11-14-2012   dr Christella Noa @[facility]    L3 -- 5  . SHOULDER ARTHROSCOPY Right 2019  . SHOULDER ARTHROSCOPY WITH ROTATOR CUFF REPAIR AND SUBACROMIAL DECOMPRESSION Left 11-12-2009   dr Percell Miller  @[facility]    w/ Labral debridement and DCR  . TONSILLECTOMY  age 37  . TOTAL KNEE ARTHROPLASTY Left 02/17/2016   Procedure: TOTAL KNEE ARTHROPLASTY;  Surgeon: Ninetta Lights, MD;  Location: [place];  Service: Orthopedics;  Laterality: Left;  . TOTAL KNEE ARTHROPLASTY Right 02-25-2004   dr Percell Miller  @[facility]   . TRANSURETHRAL RESECTION OF BLADDER TUMOR N/A 08/31/2018   Procedure: TRANSURETHRAL RESECTION OF BLADDER TUMOR (TURBT) WITH INSTILLATION OF POST OPERATIVE CHEMOTHERAPY;  Surgeon: Kathie Rhodes, MD;  Location: [facility];  Service: Urology;  Laterality: N/A;  . VARICOSE VEIN SURGERY  1995       Family History  Problem Relation Age of Onset  . Heart attack Father   . Hypertension Father   . Heart attack Brother     Social History   Tobacco Use  . Smoking status: Never Smoker  . Smokeless tobacco: Never Used  Substance Use Topics  . Alcohol use: No  . Drug use: No    Home Medications Prior to Admission medications   Medication Sig Start Date End Date Taking? Authorizing Provider  amLODipine (NORVASC) 5 MG tablet Take 1 tablet (5 mg total) by mouth daily. 11/26/18   Geradine Girt, DO  aspirin EC 81 MG EC tablet Take 1 tablet (81 mg total) by mouth daily. 11/26/18   Geradine Girt, DO  clopidogrel (PLAVIX) 75 MG tablet Take 1 tablet (75 mg total) by mouth daily. 12/10/18   Burtis Junes, NP  diclofenac sodium (VOLTAREN) 1 % GEL Apply 1 application topically at bedtime as needed (pain.).    [provider]  Dulaglutide (TRULICITY) 1.5 0000000 SOPN Inject 1.5 mg into the skin every  Sunday.     [provider]  empagliflozin (JARDIANCE) 25 MG TABS tablet Take 25 mg by mouth daily.    [provider]  Evolocumab (REPATHA SURECLICK) [id number] MG/ML SOAJ Inject 1 pen into the skin every 14 (fourteen) days. 11/26/18   Burnell Blanks, MD  Homeopathic Products (THERAWORX GLOVE + FOAM EX) Apply 1 application topically 2 (two) times daily as needed (leg cramps).    [provider]  icosapent Ethyl (VASCEPA) 1 g capsule Take 2 capsules (2 g total) by mouth 2 (two) times daily. 12/10/18   Sherren Mocha, MD  insulin glargine (LANTUS) 100 UNIT/ML injection Inject 50 Units into the skin at bedtime.     [provider]  insulin lispro (HUMALOG) 100 UNIT/ML KwikPen Inject 15 Units into the skin 2 (  two) times daily. 09/19/18   [provider]  isosorbide mononitrate (IMDUR) 60 MG 24 hr tablet Take 1 tablet (60 mg total) by mouth daily. 01/02/19 04/02/19  Burtis Junes, NP  metoprolol tartrate (LOPRESSOR) 50 MG tablet Take 1 tablet (50 mg total) by mouth 2 (two) times daily. 01/02/19 04/02/19  Burtis Junes, NP  nitroGLYCERIN (NITROSTAT) 0.4 MG SL tablet Place 1 tablet (0.4 mg total) under the tongue every 5 (five) minutes as needed for chest pain. 12/10/18 03/10/19  Burtis Junes, NP  prochlorperazine (COMPAZINE) 10 MG tablet TAKE 1 TABLET BY MOUTH EVERY 6 HOURS AS NEEDED FOR NAUSEA AND VOMITING 11/12/18   Wyatt Portela, MD  tetrahydrozoline 0.05 % ophthalmic solution Place 1 drop into both eyes at bedtime as needed (eye irritation).    [provider]  fenofibrate (TRICOR) 145 MG tablet Take 145 mg by mouth daily.    08/02/18  [provider]    Allergies    Lisinopril, Statins, Atorvastatin, and Rosuvastatin  Review of Systems   Review of Systems  Constitutional: Negative for diaphoresis and fever.  HENT: Negative for sore throat.   Eyes: Negative for visual disturbance.  Respiratory: Positive for shortness of  breath. Negative for cough.   Cardiovascular: Positive for chest pain. Negative for claudication.  Gastrointestinal: Negative for abdominal pain.  Genitourinary: Negative for dysuria.  Musculoskeletal: Negative for back pain.  Skin: Negative for rash.  Neurological: Negative for headaches.    Physical Exam Updated Vital Signs BP 136/76 (BP Location: Left Arm)   Pulse 91   Temp 97.8 F (36.6 C) (Oral)   Resp 16   Ht 6' (1.829 m)   Wt 106.1 kg   SpO2 100%   BMI 31.74 kg/m   Physical Exam Vitals and nursing note reviewed.  Constitutional:      Appearance: He is well-developed.  HENT:     Head: Normocephalic and atraumatic.  Eyes:     Conjunctiva/sclera: Conjunctivae normal.  Cardiovascular:     Rate and Rhythm: Normal rate and regular rhythm.     Heart sounds: Normal heart sounds. No murmur.  Pulmonary:     Effort: Pulmonary effort is normal. No respiratory distress.     Breath sounds: Normal breath sounds.  Abdominal:     Palpations: Abdomen is soft.     Tenderness: There is no abdominal tenderness.  Musculoskeletal:        General: Normal range of motion.     Cervical back: Neck supple.     Right lower leg: No tenderness. Edema present.     Left lower leg: No tenderness. Edema present.  Skin:    General: Skin is warm and dry.     Capillary Refill: Capillary refill takes less than 2 seconds.  Neurological:     General: No focal deficit present.     Mental Status: He is alert.     ED Results / Procedures / Treatments   Labs (all labs ordered are listed, but only abnormal results are displayed) Labs Reviewed  BASIC METABOLIC PANEL - Abnormal; Notable for the following components:      Result Value   Glucose, Bld 157 (*)    BUN 36 (*)    Creatinine, Ser 1.99 (*)    GFR calc non Af Amer 33 (*)    GFR calc Af Amer 39 (*)    All other components within normal limits  CBC - Abnormal; Notable for the following components:   RBC  2.18 (*)    Hemoglobin 6.9 (*)     HCT 20.6 (*)    RDW 15.9 (*)    Platelets 47 (*)    nRBC 0.5 (*)    All other components within normal limits  CBC - Abnormal; Notable for the following components:   WBC 3.5 (*)    RBC 2.25 (*)    Hemoglobin 7.1 (*)    HCT 20.7 (*)    RDW 15.9 (*)    Platelets 43 (*)    nRBC 0.9 (*)    All other components within normal limits  BASIC METABOLIC PANEL - Abnormal; Notable for the following components:   Glucose, Bld 109 (*)    BUN 35 (*)    Creatinine, Ser 1.88 (*)    GFR calc non Af Amer 36 (*)    GFR calc Af Amer 41 (*)    All other components within normal limits  HEMOGLOBIN A1C - Abnormal; Notable for the following components:   Hgb A1c MFr Bld 7.4 (*)    All other components within normal limits  HEMOGLOBIN AND HEMATOCRIT, BLOOD - Abnormal; Notable for the following components:   Hemoglobin 8.4 (*)    HCT 24.9 (*)    All other components within normal limits  TROPONIN I (HIGH SENSITIVITY) - Abnormal; Notable for the following components:   Troponin I (High Sensitivity) 798 (*)    All other components within normal limits  TROPONIN I (HIGH SENSITIVITY) - Abnormal; Notable for the following components:   Troponin I (High Sensitivity) 954 (*)    All other components within normal limits  TROPONIN I (HIGH SENSITIVITY) - Abnormal; Notable for the following components:   Troponin I (High Sensitivity) 1,055 (*)    All other components within normal limits  TROPONIN I (HIGH SENSITIVITY) - Abnormal; Notable for the following components:   Troponin I (High Sensitivity) 887 (*)    All other components within normal limits  RESPIRATORY PANEL BY RT PCR (FLU A&B, COVID)  MRSA PCR SCREENING  PROTIME-INR  GLUCOSE, CAPILLARY  POC OCCULT BLOOD, ED  TYPE AND SCREEN  PREPARE RBC (CROSSMATCH)    EKG EKG Interpretation  Date/Time:  Sunday February 03 2019 19:15:51 EST Ventricular Rate:  81 PR Interval:  210 QRS Duration: 80 QT Interval:  374 QTC Calculation: 434 R  Axis:   -12 Text Interpretation: Sinus rhythm with 1st degree A-V block Left ventricular hypertrophy with repolarization abnormality ( R in aVL ) Abnormal ECG new ischemic changes comapred with prior 11/20 Confirmed by Aletta Edouard [phone]) on 02/03/2019 7:32:45 PM   Radiology DG Chest Portable 1 View  Result Date: 02/03/2019 CLINICAL DATA:  69 year old male with history of intermittent chest pain for the past 2-3 months. EXAM: PORTABLE CHEST 1 VIEW COMPARISON:  Chest x-ray 11/22/2018. FINDINGS: Lung volumes are low. No consolidative airspace disease. No pleural effusions. No pneumothorax. No pulmonary nodule or mass noted. Pulmonary vasculature and the cardiomediastinal silhouette are within normal limits. Aortic atherosclerosis. IMPRESSION: 1.  No radiographic evidence of acute cardiopulmonary disease. 2. Aortic atherosclerosis. Electronically Signed   By: Vinnie Langton M.D.   On: 02/03/2019 20:50    Procedures .Critical Care Performed by: Hayden Rasmussen, MD Authorized by: Hayden Rasmussen, MD   Critical care provider statement:    Critical care time (minutes):  45   Critical care time was exclusive of:  Separately billable procedures and treating other patients   Critical care was necessary to treat or prevent imminent or  life-threatening deterioration of the following conditions:  Circulatory failure and cardiac failure   Critical care was time spent personally by me on the following activities:  Discussions with consultants, evaluation of patient's response to treatment, examination of patient, ordering and performing treatments and interventions, ordering and review of laboratory studies, ordering and review of radiographic studies, pulse oximetry, re-evaluation of patient's condition, obtaining history from patient or surrogate, review of old charts and development of treatment plan with patient or surrogate   I assumed direction of critical care for this patient from another  provider in my specialty: no     (including critical care time)  Medications Ordered in ED Medications  sodium chloride flush (NS) 0.9 % injection 3 mL (3 mLs Intravenous Not Given 02/03/19 1955)  0.9 %  sodium chloride infusion (Manually program via Guardrails IV Fluids) (has no administration in time range)  isosorbide mononitrate (IMDUR) 24 hr tablet 60 mg (60 mg Oral Given 02/04/19 1023)  metoprolol tartrate (LOPRESSOR) tablet 50 mg (50 mg Oral Given 02/04/19 1024)  nitroGLYCERIN (NITROSTAT) SL tablet 0.4 mg (0.4 mg Sublingual Given 02/04/19 1027)  insulin aspart (novoLOG) injection 15 Units (15 Units Subcutaneous Given 02/04/19 1021)  multivitamin with minerals tablet 1 tablet (1 tablet Oral Given 02/04/19 1024)  acetaminophen (TYLENOL) tablet 650 mg (650 mg Oral Given 02/04/19 1026)  ondansetron (ZOFRAN) injection 4 mg (has no administration in time range)  insulin aspart (novoLOG) injection 0-9 Units (0 Units Subcutaneous Not Given 02/04/19 0700)  perflutren lipid microspheres (DEFINITY) IV suspension (2 mLs Intravenous Given 02/04/19 1001)    ED Course  I have reviewed the triage vital signs and the nursing notes.  Pertinent labs & imaging results that were available during my care of the patient were reviewed by me and considered in my medical decision making (see chart for details).  Clinical Course as of Feb 04 1120  Sun Feb 03, 2019  2011 Patient is here with accelerating angina.  Ischemic appearing EKG.  Differential includes unstable angina, ACS, anemia, metabolic derangement.  Initial hemoglobin 6.9 down from his prior 9.6.  He is required transfusions in the past, likely due to his chemotherapy.  Have ordered him 2 units of blood.  Will review with cardiology.   [MB]  2016 Platelets are also much lower than he has been prior.  He denies any hematuria or rectal bleeding.  He said he gets nosebleeds every once in a while due to the [place] in his house.   [MB]  2045 Patient's  troponin back and elevated at 798.  Have paged cardiology.  Creatinine abnormal at 1.99 although close to his baseline.   [MB]  2051 Chest x-ray interpreted by me as no gross infiltrates.  Awaiting radiology reading.   [MB]  2135 Discussed with Dr. Charissa Bash from cardiology.  He agrees with management of needs transfusion and likely because of his unstable angina.  They will consult on him.   [MB]  2231 Discussed with Dr. Marlowe Sax from [facility] hospitalist who will evaluate the patient for admission.   [MB]  2232 Rectal exam done with nurse as chaperone.  Normal sphincter tone brown stool in vault.  Sample sent to lab for guaiac.   [MB]    Clinical Course User Index [MB] Hayden Rasmussen, MD   MDM Rules/Calculators/A&P HEAR Score: 7                     Final Clinical Impression(s) / ED Diagnoses Final diagnoses:  Unstable angina (HCC)  Symptomatic anemia  Thrombocytopenia (HCC)  Malignant neoplasm of urinary bladder, unspecified site [facility])    Rx / DC Orders ED Discharge Orders    None       Hayden Rasmussen, MD 02/04/19 1125

## 2019-02-03 NOTE — ED Triage Notes (Signed)
The pt is c/o intermittent chest pain for 2-3 months he has been taking nitro for the pain  Known heart blockages in his heart  He just took nitro just prio to coming in here.  Chest pain better at present  On chemo therapy for kidney cancer

## 2019-02-04 ENCOUNTER — Inpatient Hospital Stay (HOSPITAL_COMMUNITY): Payer: Medicare Other

## 2019-02-04 DIAGNOSIS — T451X5A Adverse effect of antineoplastic and immunosuppressive drugs, initial encounter: Secondary | ICD-10-CM

## 2019-02-04 DIAGNOSIS — D61810 Antineoplastic chemotherapy induced pancytopenia: Secondary | ICD-10-CM

## 2019-02-04 DIAGNOSIS — I219 Acute myocardial infarction, unspecified: Secondary | ICD-10-CM

## 2019-02-04 DIAGNOSIS — D696 Thrombocytopenia, unspecified: Secondary | ICD-10-CM

## 2019-02-04 DIAGNOSIS — N289 Disorder of kidney and ureter, unspecified: Secondary | ICD-10-CM

## 2019-02-04 DIAGNOSIS — I340 Nonrheumatic mitral (valve) insufficiency: Secondary | ICD-10-CM

## 2019-02-04 DIAGNOSIS — N1832 Chronic kidney disease, stage 3b: Secondary | ICD-10-CM

## 2019-02-04 DIAGNOSIS — I214 Non-ST elevation (NSTEMI) myocardial infarction: Secondary | ICD-10-CM

## 2019-02-04 DIAGNOSIS — I361 Nonrheumatic tricuspid (valve) insufficiency: Secondary | ICD-10-CM

## 2019-02-04 DIAGNOSIS — D649 Anemia, unspecified: Secondary | ICD-10-CM

## 2019-02-04 LAB — GLUCOSE, CAPILLARY
Glucose-Capillary: 93 mg/dL (ref 70–99)
Glucose-Capillary: 161 mg/dL — ABNORMAL HIGH (ref 70–99)
Glucose-Capillary: 149 mg/dL — ABNORMAL HIGH (ref 70–99)
Glucose-Capillary: 152 mg/dL — ABNORMAL HIGH (ref 70–99)

## 2019-02-04 LAB — BASIC METABOLIC PANEL
Anion gap: 9 (ref 5–15)
BUN: 35 mg/dL — ABNORMAL HIGH (ref 8–23)
CO2: 22 mmol/L (ref 22–32)
Calcium: 9 mg/dL (ref 8.9–10.3)
Chloride: 110 mmol/L (ref 98–111)
Creatinine, Ser: 1.88 mg/dL — ABNORMAL HIGH (ref 0.61–1.24)
GFR calc Af Amer: 41 mL/min — ABNORMAL LOW (ref >60)
GFR calc non Af Amer: 36 mL/min — ABNORMAL LOW (ref >60)
Glucose, Bld: 109 mg/dL — ABNORMAL HIGH (ref 70–99)
Potassium: 4.5 mmol/L (ref 3.5–5.1)
Sodium: 141 mmol/L (ref 135–145)

## 2019-02-04 LAB — ECHOCARDIOGRAM COMPLETE
Height: 72 in
Weight: 3744 oz

## 2019-02-04 LAB — HEMOGLOBIN AND HEMATOCRIT, BLOOD
HCT: 24.9 % — ABNORMAL LOW (ref 39.0–52.0)
Hemoglobin: 8.4 g/dL — ABNORMAL LOW (ref 13.0–17.0)

## 2019-02-04 LAB — TROPONIN I (HIGH SENSITIVITY)
Troponin I (High Sensitivity): 1055 ng/L (ref <18)
Troponin I (High Sensitivity): 887 ng/L (ref <18)

## 2019-02-04 LAB — CBC
HCT: 20.7 % — ABNORMAL LOW (ref 39.0–52.0)
Hemoglobin: 7.1 g/dL — ABNORMAL LOW (ref 13.0–17.0)
MCH: 31.6 pg (ref 26.0–34.0)
MCHC: 34.3 g/dL (ref 30.0–36.0)
MCV: 92 fL (ref 80.0–100.0)
Platelets: 43 10*3/uL — ABNORMAL LOW (ref 150–400)
RBC: 2.25 MIL/uL — ABNORMAL LOW (ref 4.22–5.81)
RDW: 15.9 % — ABNORMAL HIGH (ref 11.5–15.5)
WBC: 3.5 10*3/uL — ABNORMAL LOW (ref 4.0–10.5)
nRBC: 0.9 % — ABNORMAL HIGH (ref 0.0–0.2)

## 2019-02-04 LAB — HEMOGLOBIN A1C
Hgb A1c MFr Bld: 7.4 % — ABNORMAL HIGH (ref 4.8–5.6)
Mean Plasma Glucose: 165.68 mg/dL

## 2019-02-04 LAB — MRSA PCR SCREENING: MRSA by PCR: NEGATIVE

## 2019-02-04 MED ORDER — ONDANSETRON HCL 4 MG/2ML IJ SOLN: 4.0000 mg | Freq: Four times a day (QID) | INTRAMUSCULAR | Status: DC | PRN

## 2019-02-04 MED ORDER — SODIUM CHLORIDE 0.9% FLUSH: 3.0000 mL | INTRAVENOUS | Status: DC | PRN

## 2019-02-04 MED ORDER — ISOSORBIDE MONONITRATE ER 60 MG PO TB24
60.0000 mg | ORAL_TABLET | Freq: Every day | ORAL | Status: DC
  Administered 2019-02-04 – 2019-02-05 (×2): 60 mg via ORAL
  Filled 2019-02-04 (×2): qty 1

## 2019-02-04 MED ORDER — SODIUM CHLORIDE 0.9% FLUSH
3.0000 mL | Freq: Two times a day (BID) | INTRAVENOUS | Status: DC
  Administered 2019-02-04 – 2019-02-05 (×2): 3 mL via INTRAVENOUS

## 2019-02-04 MED ORDER — PERFLUTREN LIPID MICROSPHERE
1.0000 mL | INTRAVENOUS | Status: AC | PRN
  Administered 2019-02-04: 2 mL via INTRAVENOUS
  Filled 2019-02-04: qty 10

## 2019-02-04 MED ORDER — SODIUM CHLORIDE 0.9 % IV SOLN: 250.0000 mL | INTRAVENOUS | Status: DC | PRN

## 2019-02-04 MED ORDER — INSULIN ASPART 100 UNIT/ML ~~LOC~~ SOLN
0.0000 [IU] | Freq: Three times a day (TID) | SUBCUTANEOUS | Status: DC
  Administered 2019-02-04 – 2019-02-05 (×2): 2 [IU] via SUBCUTANEOUS

## 2019-02-04 MED ORDER — ADULT MULTIVITAMIN W/MINERALS CH
1.0000 | ORAL_TABLET | Freq: Every day | ORAL | Status: DC
  Administered 2019-02-04 – 2019-02-05 (×2): 1 via ORAL
  Filled 2019-02-04 (×2): qty 1

## 2019-02-04 MED ORDER — INSULIN ASPART 100 UNIT/ML ~~LOC~~ SOLN
15.0000 [IU] | Freq: Two times a day (BID) | SUBCUTANEOUS | Status: DC
  Administered 2019-02-04 (×2): 15 [IU] via SUBCUTANEOUS

## 2019-02-04 MED ORDER — METOPROLOL TARTRATE 50 MG PO TABS
50.0000 mg | ORAL_TABLET | Freq: Two times a day (BID) | ORAL | Status: DC
  Administered 2019-02-04 – 2019-02-05 (×3): 50 mg via ORAL
  Filled 2019-02-04 (×3): qty 1

## 2019-02-04 MED ORDER — ACETAMINOPHEN 325 MG PO TABS
650.0000 mg | ORAL_TABLET | ORAL | Status: DC | PRN
  Administered 2019-02-04 (×2): 650 mg via ORAL
  Filled 2019-02-04 (×2): qty 2

## 2019-02-04 MED ORDER — NITROGLYCERIN 0.4 MG SL SUBL
0.4000 mg | SUBLINGUAL_TABLET | SUBLINGUAL | Status: DC | PRN
  Administered 2019-02-04 (×2): 0.4 mg via SUBLINGUAL
  Filled 2019-02-04: qty 1

## 2019-02-04 NOTE — Progress Notes (Signed)
Progress Note  Patient Name: Chad Yu Date of Encounter: 02/04/2019  Primary Cardiologist: Lauree Chandler, MD   Subjective   Lying comfortably in bed, fully supine.  Denies angina or dyspnea. No chest pain since hospital admission and initiation of transfusion. Just finished receiving his second unit of PRBC.  Inpatient Medications    Scheduled Meds: . sodium chloride   Intravenous Once  . insulin aspart  0-9 Units Subcutaneous TID WC  . insulin aspart  15 Units Subcutaneous BID WC  . isosorbide mononitrate  60 mg Oral Daily  . metoprolol tartrate  50 mg Oral BID  . multivitamin with minerals  1 tablet Oral Daily  . sodium chloride flush  3 mL Intravenous Once   Continuous Infusions:  PRN Meds: acetaminophen, nitroGLYCERIN, ondansetron (ZOFRAN) IV   Vital Signs    Vitals:   02/04/19 0143 02/04/19 0249 02/04/19 0559 02/04/19 0852  BP: 135/70 118/70 124/75 109/61  Pulse: 78  71 80  Resp:   18   Temp: 98.2 F (36.8 C) 98.3 F (36.8 C) 98.1 F (36.7 C) 97.8 F (36.6 C)  TempSrc: Oral Oral Oral Oral  SpO2:    95%  Weight:      Height:        Intake/Output Summary (Last 24 hours) at 02/04/2019 0857 Last data filed at 02/04/2019 0309 Gross per 24 hour  Intake 315 ml  Output --  Net 315 ml   Last 3 Weights 02/03/2019 02/03/2019 01/24/2019  Weight (lbs) 234 lb 234 lb 235 lb 2 oz  Weight (kg) 106.142 kg 106.142 kg 106.652 kg      Telemetry    Sinus rhythm- Personally Reviewed  ECG    Sinus rhythm, prominent ST segment depression T wave inversion in inferior leads as well as V3-V6, more pronounced than on ECG from 2023-01-10- Personally Reviewed  Physical Exam  Pale GEN: No acute distress.   Neck: No JVD Cardiac: RRR, no murmurs, rubs, or gallops.  Respiratory: Clear to auscultation bilaterally. GI: Soft, nontender, non-distended  MS: No edema; No deformity. Neuro:  Nonfocal  Psych: Normal affect   Labs    High Sensitivity Troponin:    Recent Labs  Lab 02/03/19 1938 02/03/19 2127 02/04/19 0208  TROPONINIHS 798* 954* 1,055*      Chemistry Recent Labs  Lab 02/03/19 1938 02/04/19 0216  NA 139 141  K 4.4 4.5  CL 105 110  CO2 22 22  GLUCOSE 157* 109*  BUN 36* 35*  CREATININE 1.99* 1.88*  CALCIUM 9.1 9.0  GFRNONAA 33* 36*  GFRAA 39* 41*  ANIONGAP 12 9     Hematology Recent Labs  Lab 02/03/19 1938 02/04/19 0216  WBC 4.3 3.5*  RBC 2.18* 2.25*  HGB 6.9* 7.1*  HCT 20.6* 20.7*  MCV 94.5 92.0  MCH 31.7 31.6  MCHC 33.5 34.3  RDW 15.9* 15.9*  PLT 47* 43*    BNPNo results for input(s): BNP, PROBNP in the last 168 hours.   DDimer No results for input(s): DDIMER in the last 168 hours.   Radiology    DG Chest Portable 1 View  Result Date: 02/03/2019 CLINICAL DATA:  69 year old male with history of intermittent chest pain for the past 2-3 months. EXAM: PORTABLE CHEST 1 VIEW COMPARISON:  Chest x-ray 11/22/2018. FINDINGS: Lung volumes are low. No consolidative airspace disease. No pleural effusions. No pneumothorax. No pulmonary nodule or mass noted. Pulmonary vasculature and the cardiomediastinal silhouette are within normal limits. Aortic atherosclerosis. IMPRESSION: 1.  No radiographic evidence of acute cardiopulmonary disease. 2. Aortic atherosclerosis. Electronically Signed   By: Vinnie Langton M.D.   On: 02/03/2019 20:50    Cardiac Studies  Nuclear stress test 01/27/2015  Nuclear stress EF: 62%.  There was no ST segment deviation noted during stress.  The study is normal.  The left ventricular ejection fraction is normal (55-65%).   1. No ischemia or infarction by perfusion imaging.  2. Normal LV systolic function and wall motion.   Echocardiogram 11/23/2018   1. Left ventricular ejection fraction, by visual estimation, is 60 to 65%. The left ventricle has normal function. There is mildly increased left ventricular hypertrophy.  2. Left ventricular diastolic parameters are indeterminate.   3. Global right ventricle has normal systolic function.The right ventricular size is normal. No increase in right ventricular wall thickness.  4. Left atrial size was moderately dilated.  5. Right atrial size was normal.  6. Trivial pericardial effusion is present.  7. The mitral valve is normal in structure. No evidence of mitral valve regurgitation.  8. The tricuspid valve is normal in structure. Tricuspid valve regurgitation is trivial.  9. There is Mild calcification of the aortic valve. 10. The aortic valve is tricuspid. Aortic valve regurgitation is not visualized. 11. The pulmonic valve was not well visualized. Pulmonic valve regurgitation is not visualized. 12. Normal pulmonary artery systolic pressure. 13. The inferior vena cava is normal in size with <50% respiratory variability, suggesting right atrial pressure of 8 mmHg.  Patient Profile     69 y.o. male with known high-grade stenosis distal to previously placed stent in the first diagonal artery, previously with mild stable angina pectoris, now with crescendo angina in the setting of severe anemia, likely chemotherapy related.  Also has moderate thrombocytopenia.  No overt bleeding.  Assessment & Plan    Suspect unstable angina related to severe anemia.  Notes marked improvement following administration of blood products. It is a little concerning that his ECG changes are more pronounced than they were in 01/26/23 when he had a similar degree of anemia and also that his high-sensitivity troponin is increased. Nevertheless, still suspect that current symptoms represent demand ischemia. Regardless, he is not a good candidate for any invasive cardiac procedures in the setting of marked thrombocytopenia and advanced chronic kidney disease. We will see how he does following his transfusion walking around the unit. Can receive a diet from the cardiology point of view.  For questions or updates, please contact [place] Please  consult [email or web address] for contact info under        Signed, Sanda Klein, MD  02/04/2019, 8:57 AM

## 2019-02-04 NOTE — Plan of Care (Signed)
  Problem: Education: Goal: Ability to demonstrate management of disease process will improve Outcome: Progressing Goal: Ability to verbalize understanding of medication therapies will improve Outcome: Progressing Goal: Individualized Educational Video(s) Outcome: Progressing   Problem: Activity: Goal: Capacity to carry out activities will improve Outcome: Progressing   Problem: Cardiac: Goal: Ability to achieve and maintain adequate cardiopulmonary perfusion will improve Outcome: Progressing   Problem: Education: Goal: Knowledge of General Education information will improve Description: Including pain rating scale, medication(s)/side effects and non-pharmacologic comfort measures Outcome: Progressing   Problem: Health Behavior/Discharge Planning: Goal: Ability to manage health-related needs will improve Outcome: Progressing   Problem: Clinical Measurements: Goal: Ability to maintain clinical measurements within normal limits will improve Outcome: Progressing Goal: Will remain free from infection Outcome: Progressing Goal: Diagnostic test results will improve Outcome: Progressing Goal: Respiratory complications will improve Outcome: Progressing Goal: Cardiovascular complication will be avoided Outcome: Progressing   Problem: Activity: Goal: Risk for activity intolerance will decrease Outcome: Progressing   Problem: Nutrition: Goal: Adequate nutrition will be maintained Outcome: Progressing   Problem: Coping: Goal: Level of anxiety will decrease Outcome: Progressing   Problem: Elimination: Goal: Will not experience complications related to bowel motility Outcome: Progressing Goal: Will not experience complications related to urinary retention Outcome: Progressing   Problem: Pain Managment: Goal: General experience of comfort will improve Outcome: Progressing   Problem: Safety: Goal: Ability to remain free from injury will improve Outcome: Progressing    Problem: Skin Integrity: Goal: Risk for impaired skin integrity will decrease Outcome: Progressing   Problem: Education: Goal: Understanding of cardiac disease, CV risk reduction, and recovery process will improve Outcome: Progressing Goal: Individualized Educational Video(s) Outcome: Progressing   Problem: Activity: Goal: Ability to tolerate increased activity will improve Outcome: Progressing   Problem: Cardiac: Goal: Ability to achieve and maintain adequate cardiovascular perfusion will improve Outcome: Progressing   Problem: Health Behavior/Discharge Planning: Goal: Ability to safely manage health-related needs after discharge will improve Outcome: Progressing   

## 2019-02-04 NOTE — Progress Notes (Signed)
  Echocardiogram 2D Echocardiogram has been performed.  Chad Yu 02/04/2019, 10:32 AM

## 2019-02-04 NOTE — Progress Notes (Signed)
PROGRESS NOTE    Chad Yu  [id number] DOB: 22-Sep-1950 DOA: 02/03/2019 PCP: [name], MD  Outpatient Specialists:   Brief Narrative:  Patient is a 69 year old Caucasian male with past medical history significant for CAD status post PCI, hypertension, hyperlipidemia, bladder cancer currently on chemo and type 2 diabetes.  Patient has had chronic stable angina for several months, but presented with unstable angina.  Patient endorsed typical chest pain, with elevated troponin and EKG changes, specifically, ST segment depression in leads II, III, aVF, V3 to V6.  A cardiology team has been consulted, cardiology team is directing care.  On presentation to the hospital, patient was found to have significant anemia and thrombocytopenia.  Hemoglobin of 6.9 g/dL was noted on presentation, with platelet of 43 (down from 194).  Will check heparin antibody.  Patient has been transfused with packed red blood cell.  Last hemoglobin is 8.4 g/dL.  No heart prior or cardiac catheterization proceed due to CKD and significant thrombocytopenia.  Echocardiogram has revealed EF of 45 to 50%, left ventricular moderate hypokinesis, mid to apical anterior wall hypokinesis, dilated IVC with less than 50% respiratory variability.  Regional wall motion abnormality was also documented.  Echocardiogram done within the last 1 to 2 months revealed an EF of 60 to 65%.  02/04/2019: Patient seen.  No chest pain reported today.  Patient has been transfused with packed red blood cells.  Cardiology input is appreciated.  No invasive cardiac work-up is planned due to advanced renal disease and significant thrombocytopenia.  Will check heparin-induced thrombocytopenic antibody.  Assessment & Plan:   Principal Problem:   NSTEMI (non-ST elevated myocardial infarction) ([place]) Active Problems:   Bladder cancer (HCC)   Anemia   Thrombocytopenia (HCC)   Renal insufficiency  NSTEMI/Unstable Angina: -Known history of CAD status post  PCI.  Patient presenting with worsening chronic stable angina.  Hemodynamically stable.  High-sensitivity troponin 798 >954.  EKG with new ST depressions and T wave inversions in inferior and lateral leads.  Also has worsening anemia.  Cardiology has seen the patient and recommending giving blood transfusions at this time.  Unable to give heparin due to anemia and significant thrombocytopenia.  If patient remains symptomatic, cardiology will consider cardiac cath although it would put his kidneys at risk baseline renal insufficiency. -Cardiac monitoring -Blood transfusions for anemia -Trend troponin -Sublingual nitroglycerin as needed -Echocardiogram -Continue home metoprolol, Imdur -Hold aspirin and Plavix at this time -If he continues to have uncontrolled angina, add ranolazine to his medication regimen February 04, 2019: Kindly see above.  Chest pain has resolved.  No further invasive cardiac work-up planned.  Echocardiogram as noted.  Further management depend on hospital course.   Acute on chronic anemia: -   Suspect related to bone marrow suppression from chemo given concurrent thrombocytopenia.  Acute GI bleed less likely given negative FOBT and patient not endorsing any symptoms of GI bleed.  Hemoglobin 6.9, previously in the 9 range. -Type and screen -2 units PRBCs -Monitor H&H February 04, 2019: Continue to monitor and transfuse as deemed necessary.  Worsening thrombocytopenia Suspect related to bone marrow suppression from chemo given concurrent anemia.  Platelet count 47,000.  No signs of active bleeding. -Continue to monitor platelet count February 04, 2019: Check heparin-induced thrombocytopenic antibody  Renal insufficiency Creatinine 1.9, no significant change compared to recent labs.  It seems patient's renal function was previously normal and has deteriorated since his cancer diagnosis. -Continue to monitor renal function February 04, 2019: Renal function is  back to  baseline.  Patient has CKD 3B.  Bladder cancer Diagnosed in August 2020.  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  Followed by Dr. Alen Blew.  Currently on chemo..  Insulin-dependent diabetes mellitus -Check A1c.  Sliding scale insulin sensitive before meals and CBG checks. -Continue home insulin lispro 15 units twice daily February 04, 2019: HbA1c 7.4.  Patient is currently on subcutaneous NovoLog 15 units twice daily with meals, as well as sliding scale insulin coverage 3 times daily.  DVT prophylaxis: SCD Code Status: Full code Family Communication:  Disposition Plan: Depend on hospital course.  Likely home.   Consultants:   Cardiology  Procedures:   None  Antimicrobials:   None   Subjective: No chest pain today No shortness of breath No fever or chills  Objective: Vitals:   02/04/19 0249 02/04/19 0559 02/04/19 0852 02/04/19 1126  BP: 118/70 124/75 109/61 117/70  Pulse:  71 80 70  Resp:  18  10  Temp: 98.3 F (36.8 C) 98.1 F (36.7 C) 97.8 F (36.6 C) 97.7 F (36.5 C)  TempSrc: Oral Oral Oral Oral  SpO2:   95% 94%  Weight:      Height:        Intake/Output Summary (Last 24 hours) at 02/04/2019 1245 Last data filed at 02/04/2019 1129 Gross per 24 hour  Intake 795 ml  Output 900 ml  Net -105 ml   Filed Weights   02/03/19 1917 02/03/19 1930  Weight: 106.1 kg 106.1 kg    Examination:  General exam: Appears calm and comfortable.  Respiratory system: Clear to auscultation. Respiratory effort normal. Cardiovascular system: S1 & S2 heard, RRR.  Gastrointestinal system: Abdomen is nondistended, soft and nontender. No organomegaly or masses felt. Normal bowel sounds heard. Central nervous system: Alert and oriented. No focal neurological deficits. Extremities: No significant leg edema  Data Reviewed: I have personally reviewed following labs and imaging studies  CBC: Recent Labs  Lab 02/03/19 1938  02/04/19 0216 02/04/19 0909  WBC 4.3 3.5*  --   HGB 6.9* 7.1* 8.4*  HCT 20.6* 20.7* 24.9*  MCV 94.5 92.0  --   PLT 47* 43*  --    Basic Metabolic Panel: Recent Labs  Lab 02/03/19 1938 02/04/19 0216  NA 139 141  K 4.4 4.5  CL 105 110  CO2 22 22  GLUCOSE 157* 109*  BUN 36* 35*  CREATININE 1.99* 1.88*  CALCIUM 9.1 9.0   GFR: Estimated Creatinine Clearance: 46.7 mL/min (A) (by C-G formula based on SCr of 1.88 mg/dL (H)). Liver Function Tests: No results for input(s): AST, ALT, ALKPHOS, BILITOT, PROT, ALBUMIN in the last 168 hours. No results for input(s): LIPASE, AMYLASE in the last 168 hours. No results for input(s): AMMONIA in the last 168 hours. Coagulation Profile: Recent Labs  Lab 02/03/19 2009  INR 1.2   Cardiac Enzymes: No results for input(s): CKTOTAL, CKMB, CKMBINDEX, TROPONINI in the last 168 hours. BNP (last 3 results) No results for input(s): PROBNP in the last 8760 hours. HbA1C: Recent Labs    02/04/19 0216  HGBA1C 7.4*   CBG: Recent Labs  Lab 02/04/19 0606 02/04/19 1128  GLUCAP 93 161*   Lipid Profile: No results for input(s): CHOL, HDL, LDLCALC, TRIG, CHOLHDL, LDLDIRECT in the last 72 hours. Thyroid Function Tests: No results for input(s): TSH, T4TOTAL, FREET4, T3FREE, THYROIDAB in the last 72 hours. Anemia Panel: No results for input(s): VITAMINB12, FOLATE, FERRITIN, TIBC, IRON, RETICCTPCT in the  last 72 hours. Urine analysis:    Component Value Date/Time   COLORURINE YELLOW 11/26/2008 [place] 11/26/2008 0947   LABSPEC 1.018 11/26/2008 0947   PHURINE 5.0 11/26/2008 0947   GLUCOSEU 100 (A) 11/26/2008 0947   HGBUR NEGATIVE 11/26/2008 [place] 11/26/2008 0947   [place] 11/26/2008 0947   PROTEINUR NEGATIVE 11/26/2008 0947   UROBILINOGEN 0.2 11/26/2008 0947   NITRITE NEGATIVE 11/26/2008 0947   LEUKOCYTESUR  11/26/2008 0947    NEGATIVE MICROSCOPIC NOT DONE ON URINES WITH NEGATIVE PROTEIN,  BLOOD, LEUKOCYTES, NITRITE, OR GLUCOSE <1000 mg/dL.   Sepsis Labs: @LABRCNTIP (procalcitonin:4,lacticidven:4)  ) Recent Results (from the past 240 hour(s))  Respiratory Panel by RT PCR (Flu A&B, Covid) - Nasopharyngeal Swab     Status: None   Collection Time: 02/03/19  7:48 PM   Specimen: Nasopharyngeal Swab  Result Value Ref Range Status   SARS Coronavirus 2 by RT PCR NEGATIVE NEGATIVE Final    Comment: (NOTE) SARS-CoV-2 target nucleic acids are NOT DETECTED. The SARS-CoV-2 RNA is generally detectable in upper respiratoy specimens during the acute phase of infection. The lowest concentration of SARS-CoV-2 viral copies this assay can detect is 131 copies/mL. A negative result does not preclude SARS-Cov-2 infection and should not be used as the sole basis for treatment or other patient management decisions. A negative result may occur with  improper specimen collection/handling, submission of specimen other than nasopharyngeal swab, presence of viral mutation(s) within the areas targeted by this assay, and inadequate number of viral copies (<131 copies/mL). A negative result must be combined with clinical observations, patient history, and epidemiological information. The expected result is Negative. Fact Sheet for Patients:  PinkCheek.be Fact Sheet for Healthcare Providers:  GravelBags.it This test is not yet ap proved or cleared by the [place] FDA and  has been authorized for detection and/or diagnosis of SARS-CoV-2 by FDA under an Emergency Use Authorization (EUA). This EUA will remain  in effect (meaning this test can be used) for the duration of the COVID-19 declaration under Section 564(b)(1) of the Act, 21 U.S.C. section 360bbb-3(b)(1), unless the authorization is terminated or revoked sooner.    Influenza A by PCR NEGATIVE NEGATIVE Final   Influenza B by PCR NEGATIVE NEGATIVE Final    Comment: (NOTE) The  Xpert Xpress SARS-CoV-2/FLU/RSV assay is intended as an aid in  the diagnosis of influenza from Nasopharyngeal swab specimens and  should not be used as a sole basis for treatment. Nasal washings and  aspirates are unacceptable for Xpert Xpress SARS-CoV-2/FLU/RSV  testing. Fact Sheet for Patients: PinkCheek.be Fact Sheet for Healthcare Providers: GravelBags.it This test is not yet approved or cleared by the [place] FDA and  has been authorized for detection and/or diagnosis of SARS-CoV-2 by  FDA under an Emergency Use Authorization (EUA). This EUA will remain  in effect (meaning this test can be used) for the duration of the  Covid-19 declaration under Section 564(b)(1) of the Act, 21  U.S.C. section 360bbb-3(b)(1), unless the authorization is  terminated or revoked. Performed at [facility], [place] [address], [place], [place] [address]   MRSA PCR Screening     Status: None   Collection Time: 02/04/19  1:46 AM   Specimen: Nasal Mucosa; Nasopharyngeal  Result Value Ref Range Status   MRSA by PCR NEGATIVE NEGATIVE Final    Comment:        The GeneXpert MRSA Assay (FDA approved for NASAL specimens only), is one  component of a comprehensive MRSA colonization surveillance program. It is not intended to diagnose MRSA infection nor to guide or monitor treatment for MRSA infections. Performed at [facility], [place] [address], [place], [place] [address]          Radiology Studies: DG Chest Portable 1 View  Result Date: 02/03/2019 CLINICAL DATA:  69 year old male with history of intermittent chest pain for the past 2-3 months. EXAM: PORTABLE CHEST 1 VIEW COMPARISON:  Chest x-ray 11/22/2018. FINDINGS: Lung volumes are low. No consolidative airspace disease. No pleural effusions. No pneumothorax. No pulmonary nodule or mass noted. Pulmonary vasculature and the cardiomediastinal silhouette are within normal  limits. Aortic atherosclerosis. IMPRESSION: 1.  No radiographic evidence of acute cardiopulmonary disease. 2. Aortic atherosclerosis. Electronically Signed   By: Vinnie Langton M.D.   On: 02/03/2019 20:50   ECHOCARDIOGRAM COMPLETE  Result Date: 02/04/2019   ECHOCARDIOGRAM REPORT   Patient Name:   JERRETT DURLING Date of Exam: 02/04/2019 Medical Rec #:  [id number]       Height:       72.0 in Accession #:    [id number]      Weight:       234.0 lb Date of Birth:  03/12/1950      BSA:          2.28 m Patient Age:    61 years        BP:           109/61 mmHg Patient Gender: M               HR:           80 bpm. Exam Location:  Inpatient Procedure: 2D Echo, Cardiac Doppler, Color Doppler and Intracardiac            Opacification Agent Indications:    Acute myocardial infarction.; R07.9* Chest pain, unspecified  History:        Patient has prior history of Echocardiogram examinations, most                 recent 11/23/2018. Previous Myocardial Infarction and CAD,                 Abnormal ECG, Signs/Symptoms:Dyspnea and Chest Pain; Risk                 Factors:Diabetes. Cancer. Chemotherapy.  Sonographer:    Roseanna Rainbow RDCS Referring Phys: [id number] [facility]  Sonographer Comments: Technically difficult study due to poor echo windows and patient is morbidly obese. Image acquisition challenging due to patient body habitus. Study difficult, could not turn light off in room. IMPRESSIONS  1. Left ventricular ejection fraction, by visual estimation, is 45 to 50%. The left ventricle has normal function. There is mildly increased left ventricular hypertrophy.  2. Moderate hypokinesis of the left ventricular, mid-apical anterior wall.  3. Indeterminate diastolic filling due to E-A fusion.  4. The left ventricle demonstrates regional wall motion abnormalities.  5. Global right ventricle has normal systolic function.The right ventricular size is normal. No increase in right ventricular wall thickness.  6. Left atrial size  was normal.  7. Right atrial size was normal.  8. The mitral valve is normal in structure. Mild mitral valve regurgitation. No evidence of mitral stenosis.  9. The tricuspid valve is normal in structure. 10. The aortic valve is normal in structure. Aortic valve regurgitation is not visualized. Mild aortic valve sclerosis without stenosis. 11. The pulmonic valve was normal in structure.  Pulmonic valve regurgitation is not visualized. 12. Moderately elevated pulmonary artery systolic pressure. 13. The inferior vena cava is dilated in size with <50% respiratory variability, suggesting right atrial pressure of 15 mmHg. 14. The left ventricular function has worsened. 15. Prior images reviewed side by side. In comparison to the previous echocardiogram(s): The left ventricular function is worsened. The left ventricular wall motion abnormality is new since 11/23/2018 and consistent with injury/ischemia in the distribution of a large diagonal or ramus intermedius artery FINDINGS  Left Ventricle: Left ventricular ejection fraction, by visual estimation, is 45 to 50%. The left ventricle has normal function. Moderate hypokinesis of the left ventricular, mid-apical anterior wall. The left ventricle demonstrates regional wall motion abnormalities. There is mildly increased left ventricular hypertrophy. Concentric left ventricular hypertrophy. Indeterminate diastolic filling due to E-A fusion. Normal left atrial pressure. Right Ventricle: The right ventricular size is normal. No increase in right ventricular wall thickness. Global RV systolic function is has normal systolic function. The tricuspid regurgitant velocity is 3.03 m/s, and with an assumed right atrial pressure  of 15 mmHg, the estimated right ventricular systolic pressure is moderately elevated at 51.7 mmHg. Left Atrium: Left atrial size was normal in size. Right Atrium: Right atrial size was normal in size Pericardium: There is no evidence of pericardial effusion.  Mitral Valve: The mitral valve is normal in structure. Mild mitral valve regurgitation. No evidence of mitral valve stenosis by observation. Tricuspid Valve: The tricuspid valve is normal in structure. Tricuspid valve regurgitation is mild. Aortic Valve: The aortic valve is normal in structure. Aortic valve regurgitation is not visualized. Mild aortic valve sclerosis is present, with no evidence of aortic valve stenosis. Pulmonic Valve: The pulmonic valve was normal in structure. Pulmonic valve regurgitation is not visualized. Pulmonic regurgitation is not visualized. Aorta: The aortic root, ascending aorta and aortic arch are all structurally normal, with no evidence of dilitation or obstruction. Venous: The inferior vena cava is dilated in size with less than 50% respiratory variability, suggesting right atrial pressure of 15 mmHg. IAS/Shunts: No atrial level shunt detected by color flow Doppler. There is no evidence of a patent foramen ovale. No ventricular septal defect is seen or detected. There is no evidence of an atrial septal defect.  LEFT VENTRICLE PLAX 2D LVIDd:         4.90 cm       Diastology LVIDs:         3.10 cm       LV e' lateral:   10.60 cm/s LV PW:         1.20 cm       LV E/e' lateral: 10.1 LV IVS:        1.20 cm       LV e' medial:    8.49 cm/s LVOT diam:     2.00 cm       LV E/e' medial:  12.6 LV SV:         75 ml LV SV Index:   31.86 LVOT Area:     3.14 cm  LV Volumes (MOD) LV area d, A2C:    40.17 cm LV area d, A4C:    38.10 cm LV area s, A2C:    29.60 cm LV area s, A4C:    25.50 cm LV major d, A2C:   9.00 cm LV major d, A4C:   8.48 cm LV major s, A2C:   7.60 cm LV major s, A4C:   7.58 cm LV vol d,  MOD A2C: 146.6 ml LV vol d, MOD A4C: 136.0 ml LV vol s, MOD A2C: 92.6 ml LV vol s, MOD A4C: 66.8 ml LV SV MOD A2C:     53.9 ml LV SV MOD A4C:     136.0 ml LV SV MOD BP:      65.9 ml RIGHT VENTRICLE             IVC RV S prime:     16.80 cm/s  IVC diam: 2.70 cm TAPSE (M-mode): 2.0 cm LEFT  ATRIUM           Index       RIGHT ATRIUM           Index LA diam:      4.10 cm 1.80 cm/m  RA Area:     11.30 cm LA Vol (A2C): 85.4 ml 37.50 ml/m RA Volume:   25.10 ml  11.02 ml/m LA Vol (A4C): 60.0 ml 26.34 ml/m  AORTIC VALVE LVOT Vmax:   114.00 cm/s LVOT Vmean:  74.600 cm/s LVOT VTI:    0.191 m  AORTA Ao Root diam: 3.20 cm Ao Asc diam:  3.20 cm MITRAL VALVE                         TRICUSPID VALVE MV Area (PHT): 4.31 cm              TR Peak grad:   36.7 mmHg MV PHT:        51.04 msec            TR Vmax:        302.97 cm/s MV Decel Time: 176 msec MV E velocity: 107.00 cm/s 103 cm/s  SHUNTS MV A velocity: 89.20 cm/s  70.3 cm/s Systemic VTI:  0.19 m MV E/A ratio:  1.20        1.5       Systemic Diam: 2.00 cm  Dani Gobble Croitoru MD Electronically signed by Sanda Klein MD Signature Date/Time: 02/04/2019/12:13:08 PM    Final         Scheduled Meds: . sodium chloride   Intravenous Once  . insulin aspart  0-9 Units Subcutaneous TID WC  . insulin aspart  15 Units Subcutaneous BID WC  . isosorbide mononitrate  60 mg Oral Daily  . metoprolol tartrate  50 mg Oral BID  . multivitamin with minerals  1 tablet Oral Daily  . sodium chloride flush  3 mL Intravenous Once  . sodium chloride flush  3 mL Intravenous Q12H   Continuous Infusions: . sodium chloride       LOS: 1 day    Time spent: 75 Minutes    Dana Allan, MD  [facility] Pager #: [phone] 7PM-7AM contact night coverage as above

## 2019-02-04 NOTE — ED Notes (Signed)
Report given unable to give report

## 2019-02-05 ENCOUNTER — Encounter (HOSPITAL_COMMUNITY): Payer: Self-pay | Admitting: Internal Medicine

## 2019-02-05 ENCOUNTER — Other Ambulatory Visit: Payer: Self-pay | Admitting: Physician Assistant

## 2019-02-05 DIAGNOSIS — K219 Gastro-esophageal reflux disease without esophagitis: Secondary | ICD-10-CM

## 2019-02-05 DIAGNOSIS — D649 Anemia, unspecified: Secondary | ICD-10-CM

## 2019-02-05 DIAGNOSIS — I200 Unstable angina: Secondary | ICD-10-CM

## 2019-02-05 DIAGNOSIS — D696 Thrombocytopenia, unspecified: Secondary | ICD-10-CM

## 2019-02-05 DIAGNOSIS — E1121 Type 2 diabetes mellitus with diabetic nephropathy: Secondary | ICD-10-CM

## 2019-02-05 DIAGNOSIS — N179 Acute kidney failure, unspecified: Secondary | ICD-10-CM

## 2019-02-05 DIAGNOSIS — I5021 Acute systolic (congestive) heart failure: Secondary | ICD-10-CM

## 2019-02-05 DIAGNOSIS — C679 Malignant neoplasm of bladder, unspecified: Secondary | ICD-10-CM

## 2019-02-05 LAB — TYPE AND SCREEN
ABO/RH(D): O POS
Antibody Screen: NEGATIVE
Unit division: 0
Unit division: 0

## 2019-02-05 LAB — GLUCOSE, CAPILLARY
Glucose-Capillary: 103 mg/dL — ABNORMAL HIGH (ref 70–99)
Glucose-Capillary: 151 mg/dL — ABNORMAL HIGH (ref 70–99)

## 2019-02-05 LAB — BPAM RBC
Blood Product Expiration Date: 202102142359
Blood Product Expiration Date: 202102142359
ISSUE DATE / TIME: 202101172129
ISSUE DATE / TIME: 202101180259
Unit Type and Rh: 5100
Unit Type and Rh: 5100

## 2019-02-05 MED ORDER — PANTOPRAZOLE SODIUM 20 MG PO TBEC: 20.0000 mg | DELAYED_RELEASE_TABLET | Freq: Every day | ORAL | 1 refills | Status: DC

## 2019-02-05 MED ORDER — ISOSORBIDE MONONITRATE ER 120 MG PO TB24: 120.0000 mg | ORAL_TABLET | Freq: Every day | ORAL | 1 refills | Status: AC

## 2019-02-05 MED ORDER — CLOPIDOGREL BISULFATE 75 MG PO TABS: 75.0000 mg | ORAL_TABLET | Freq: Every day | ORAL | 2 refills | Status: AC

## 2019-02-05 MED ORDER — ISOSORBIDE MONONITRATE ER 60 MG PO TB24: 120.0000 mg | ORAL_TABLET | Freq: Every day | ORAL | Status: DC

## 2019-02-05 NOTE — Progress Notes (Signed)
Pt's wife here, pt d/c'd via wheelchair with belongings, escorted by unit NT.

## 2019-02-05 NOTE — Plan of Care (Signed)
Pt discharging home, has follow up appointments with cardiology in 2 weeks and a repeat echo in 1 month, discussed both with pt and he understands importance of follow up appointments.

## 2019-02-05 NOTE — Progress Notes (Signed)
Progress Note  Patient Name: Chad Yu Date of Encounter: 02/05/2019  Primary Cardiologist: Lauree Chandler, MD   Subjective   Feels well, greatly improved exercise tolerance after transfusion, but did develop mild angina after walking. No complaints at rest.  Inpatient Medications    Scheduled Meds: . sodium chloride   Intravenous Once  . insulin aspart  0-9 Units Subcutaneous TID WC  . insulin aspart  15 Units Subcutaneous BID WC  . [START ON 02/06/2019] isosorbide mononitrate  120 mg Oral Daily  . metoprolol tartrate  50 mg Oral BID  . multivitamin with minerals  1 tablet Oral Daily  . sodium chloride flush  3 mL Intravenous Once  . sodium chloride flush  3 mL Intravenous Q12H   Continuous Infusions: . sodium chloride     PRN Meds: sodium chloride, acetaminophen, nitroGLYCERIN, ondansetron (ZOFRAN) IV, sodium chloride flush   Vital Signs    Vitals:   02/04/19 2307 02/05/19 0300 02/05/19 0336 02/05/19 0803  BP: 130/67  104/70 122/62  Pulse: 73 63 66 80  Resp: 19 (!) 29 15 17   Temp: 98.1 F (36.7 C)  97.8 F (36.6 C) 98 F (36.7 C)  TempSrc: Oral  Oral Oral  SpO2: 96% 94% 94% 99%  Weight:      Height:        Intake/Output Summary (Last 24 hours) at 02/05/2019 0917 Last data filed at 02/05/2019 0807 Gross per 24 hour  Intake 900 ml  Output 1825 ml  Net -925 ml   Last 3 Weights 02/03/2019 02/03/2019 01/24/2019  Weight (lbs) 234 lb 234 lb 235 lb 2 oz  Weight (kg) 106.142 kg 106.142 kg 106.652 kg      Telemetry    NSR - Personally Reviewed  ECG    No new tracing - Personally Reviewed  Physical Exam  Appears well, mildly plae. GEN: No acute distress.   Neck: No JVD Cardiac: RRR, no murmurs, rubs, or gallops.  Respiratory: Clear to auscultation bilaterally. GI: Soft, nontender, non-distended  MS: No edema; No deformity. Neuro:  Nonfocal  Psych: Normal affect   Labs    High Sensitivity Troponin:   Recent Labs  Lab 02/03/19 1938  02/03/19 2127 02/04/19 0208 02/04/19 0904  TROPONINIHS 798* 954* 1,055* 887*      Chemistry Recent Labs  Lab 02/03/19 1938 02/04/19 0216  NA 139 141  K 4.4 4.5  CL 105 110  CO2 22 22  GLUCOSE 157* 109*  BUN 36* 35*  CREATININE 1.99* 1.88*  CALCIUM 9.1 9.0  GFRNONAA 33* 36*  GFRAA 39* 41*  ANIONGAP 12 9     Hematology Recent Labs  Lab 02/03/19 1938 02/04/19 0216 02/04/19 0909  WBC 4.3 3.5*  --   RBC 2.18* 2.25*  --   HGB 6.9* 7.1* 8.4*  HCT 20.6* 20.7* 24.9*  MCV 94.5 92.0  --   MCH 31.7 31.6  --   MCHC 33.5 34.3  --   RDW 15.9* 15.9*  --   PLT 47* 43*  --     BNPNo results for input(s): BNP, PROBNP in the last 168 hours.   DDimer No results for input(s): DDIMER in the last 168 hours.   Radiology    DG Chest Portable 1 View  Result Date: 02/03/2019 CLINICAL DATA:  69 year old male with history of intermittent chest pain for the past 2-3 months. EXAM: PORTABLE CHEST 1 VIEW COMPARISON:  Chest x-ray 11/22/2018. FINDINGS: Lung volumes are low. No consolidative airspace disease. No pleural  effusions. No pneumothorax. No pulmonary nodule or mass noted. Pulmonary vasculature and the cardiomediastinal silhouette are within normal limits. Aortic atherosclerosis. IMPRESSION: 1.  No radiographic evidence of acute cardiopulmonary disease. 2. Aortic atherosclerosis. Electronically Signed   By: Vinnie Langton M.D.   On: 02/03/2019 20:50   ECHOCARDIOGRAM COMPLETE  Result Date: 02/04/2019   ECHOCARDIOGRAM REPORT   Patient Name:   Chad Yu Date of Exam: 02/04/2019 Medical Rec #:  [id number]       Height:       72.0 in Accession #:    [id number]      Weight:       234.0 lb Date of Birth:  Apr 24, 1950      BSA:          2.28 m Patient Age:    69 years        BP:           109/61 mmHg Patient Gender: M               HR:           80 bpm. Exam Location:  Inpatient Procedure: 2D Echo, Cardiac Doppler, Color Doppler and Intracardiac            Opacification Agent Indications:     Acute myocardial infarction.; R07.9* Chest pain, unspecified  History:        Patient has prior history of Echocardiogram examinations, most                 recent 11/23/2018. Previous Myocardial Infarction and CAD,                 Abnormal ECG, Signs/Symptoms:Dyspnea and Chest Pain; Risk                 Factors:Diabetes. Cancer. Chemotherapy.  Sonographer:    Roseanna Rainbow RDCS Referring Phys: [id number] [facility]  Sonographer Comments: Technically difficult study due to poor echo windows and patient is morbidly obese. Image acquisition challenging due to patient body habitus. Study difficult, could not turn light off in room. IMPRESSIONS  1. Left ventricular ejection fraction, by visual estimation, is 45 to 50%. The left ventricle has normal function. There is mildly increased left ventricular hypertrophy.  2. Moderate hypokinesis of the left ventricular, mid-apical anterior wall.  3. Indeterminate diastolic filling due to E-A fusion.  4. The left ventricle demonstrates regional wall motion abnormalities.  5. Global right ventricle has normal systolic function.The right ventricular size is normal. No increase in right ventricular wall thickness.  6. Left atrial size was normal.  7. Right atrial size was normal.  8. The mitral valve is normal in structure. Mild mitral valve regurgitation. No evidence of mitral stenosis.  9. The tricuspid valve is normal in structure. 10. The aortic valve is normal in structure. Aortic valve regurgitation is not visualized. Mild aortic valve sclerosis without stenosis. 11. The pulmonic valve was normal in structure. Pulmonic valve regurgitation is not visualized. 12. Moderately elevated pulmonary artery systolic pressure. 13. The inferior vena cava is dilated in size with <50% respiratory variability, suggesting right atrial pressure of 15 mmHg. 14. The left ventricular function has worsened. 15. Prior images reviewed side by side. In comparison to the previous echocardiogram(s):  The left ventricular function is worsened. The left ventricular wall motion abnormality is new since 11/23/2018 and consistent with injury/ischemia in the distribution of a large diagonal or ramus intermedius artery FINDINGS  Left Ventricle: Left ventricular ejection fraction, by visual estimation,  is 45 to 50%. The left ventricle has normal function. Moderate hypokinesis of the left ventricular, mid-apical anterior wall. The left ventricle demonstrates regional wall motion abnormalities. There is mildly increased left ventricular hypertrophy. Concentric left ventricular hypertrophy. Indeterminate diastolic filling due to E-A fusion. Normal left atrial pressure. Right Ventricle: The right ventricular size is normal. No increase in right ventricular wall thickness. Global RV systolic function is has normal systolic function. The tricuspid regurgitant velocity is 3.03 m/s, and with an assumed right atrial pressure  of 15 mmHg, the estimated right ventricular systolic pressure is moderately elevated at 51.7 mmHg. Left Atrium: Left atrial size was normal in size. Right Atrium: Right atrial size was normal in size Pericardium: There is no evidence of pericardial effusion. Mitral Valve: The mitral valve is normal in structure. Mild mitral valve regurgitation. No evidence of mitral valve stenosis by observation. Tricuspid Valve: The tricuspid valve is normal in structure. Tricuspid valve regurgitation is mild. Aortic Valve: The aortic valve is normal in structure. Aortic valve regurgitation is not visualized. Mild aortic valve sclerosis is present, with no evidence of aortic valve stenosis. Pulmonic Valve: The pulmonic valve was normal in structure. Pulmonic valve regurgitation is not visualized. Pulmonic regurgitation is not visualized. Aorta: The aortic root, ascending aorta and aortic arch are all structurally normal, with no evidence of dilitation or obstruction. Venous: The inferior vena cava is dilated in size with  less than 50% respiratory variability, suggesting right atrial pressure of 15 mmHg. IAS/Shunts: No atrial level shunt detected by color flow Doppler. There is no evidence of a patent foramen ovale. No ventricular septal defect is seen or detected. There is no evidence of an atrial septal defect.  LEFT VENTRICLE PLAX 2D LVIDd:         4.90 cm       Diastology LVIDs:         3.10 cm       LV e' lateral:   10.60 cm/s LV PW:         1.20 cm       LV E/e' lateral: 10.1 LV IVS:        1.20 cm       LV e' medial:    8.49 cm/s LVOT diam:     2.00 cm       LV E/e' medial:  12.6 LV SV:         75 ml LV SV Index:   31.86 LVOT Area:     3.14 cm  LV Volumes (MOD) LV area d, A2C:    40.17 cm LV area d, A4C:    38.10 cm LV area s, A2C:    29.60 cm LV area s, A4C:    25.50 cm LV major d, A2C:   9.00 cm LV major d, A4C:   8.48 cm LV major s, A2C:   7.60 cm LV major s, A4C:   7.58 cm LV vol d, MOD A2C: 146.6 ml LV vol d, MOD A4C: 136.0 ml LV vol s, MOD A2C: 92.6 ml LV vol s, MOD A4C: 66.8 ml LV SV MOD A2C:     53.9 ml LV SV MOD A4C:     136.0 ml LV SV MOD BP:      65.9 ml RIGHT VENTRICLE             IVC RV S prime:     16.80 cm/s  IVC diam: 2.70 cm TAPSE (M-mode): 2.0 cm LEFT ATRIUM  Index       RIGHT ATRIUM           Index LA diam:      4.10 cm 1.80 cm/m  RA Area:     11.30 cm LA Vol (A2C): 85.4 ml 37.50 ml/m RA Volume:   25.10 ml  11.02 ml/m LA Vol (A4C): 60.0 ml 26.34 ml/m  AORTIC VALVE LVOT Vmax:   114.00 cm/s LVOT Vmean:  74.600 cm/s LVOT VTI:    0.191 m  AORTA Ao Root diam: 3.20 cm Ao Asc diam:  3.20 cm MITRAL VALVE                         TRICUSPID VALVE MV Area (PHT): 4.31 cm              TR Peak grad:   36.7 mmHg MV PHT:        51.04 msec            TR Vmax:        302.97 cm/s MV Decel Time: 176 msec MV E velocity: 107.00 cm/s 103 cm/s  SHUNTS MV A velocity: 89.20 cm/s  70.3 cm/s Systemic VTI:  0.19 m MV E/A ratio:  1.20        1.5       Systemic Diam: 2.00 cm  Dani Gobble Eathan Groman MD Electronically signed by  Sanda Klein MD Signature Date/Time: 02/04/2019/12:13:08 PM    Final     Cardiac Studies   Nuclear stress test 01/27/2015  Nuclear stress EF: 62%.  There was no ST segment deviation noted during stress.  The study is normal.  The left ventricular ejection fraction is normal (55-65%). 1. No ischemia or infarction by perfusion imaging.  2. Normal LV systolic function and wall motion.   Echocardiogram 11/23/2018  1. Left ventricular ejection fraction, by visual estimation, is 60 to 65%. The left ventricle has normal function. There is mildly increased left ventricular hypertrophy.  2. Left ventricular diastolic parameters are indeterminate.  3. Global right ventricle has normal systolic function.The right ventricular size is normal. No increase in right ventricular wall thickness.  4. Left atrial size was moderately dilated.  5. Right atrial size was normal.  6. Trivial pericardial effusion is present.  7. The mitral valve is normal in structure. No evidence of mitral valve regurgitation.  8. The tricuspid valve is normal in structure. Tricuspid valve regurgitation is trivial.  9. There is Mild calcification of the aortic valve.  10. The aortic valve is tricuspid. Aortic valve regurgitation is not visualized.  11. The pulmonic valve was not well visualized. Pulmonic valve regurgitation is not visualized.  12. Normal pulmonary artery systolic pressure.  13. The inferior vena cava is normal in size with <50% respiratory variability, suggesting right atrial pressure of 8 mmHg.   Patient Profile     69 y.o. 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  Also has moderate thrombocytopenia.    Assessment & Plan    Reviewed clinical and test data with Dr. [name] as  well. We are in agreement with continued medical therapy. Will increase the long-acting nitrates. Option for Ranexa as well. [facility] will sign off.   Medication Recommendations:  Increase isosorbide mononitrate to 120 mg daily Other recommendations (labs, testing,  etc):  Repeat limited echo in 30 days for wall motion/EF Follow up as an outpatient:  2 weeks TOC w Dr. [name] or APP.  For questions or updates, please contact [place] Please consult [email or web address] for contact info under        Signed, Sanda Klein, MD  02/05/2019, 9:17 AM

## 2019-02-05 NOTE — Discharge Summary (Signed)
Physician Discharge Summary  Chad Yu [id number] DOB: [birth year] DOA: 02/03/2019  PCP: [name], MD  Admit date: 02/03/2019 Discharge date: 02/05/2019  Time spent: 35 minutes  Recommendations for Outpatient Follow-up:  1. Repeat basic metabolic panel to follow electrolytes and renal function 2. Repeat CBC to follow hemoglobin trend 3. Outpatient follow-up with cardiology service and with hematology/oncology service as instructed/scheduled.   Discharge Diagnoses:  Principal Problem:   NSTEMI (non-ST elevated myocardial infarction) ([place]) Active Problems:   Type 2 diabetes with nephropathy (HCC)   Bladder cancer (HCC)   Symptomatic anemia   Thrombocytopenia (HCC)   Renal insufficiency   Acute systolic HF (heart failure) (HCC)   Acute renal failure superimposed on stage 3b chronic kidney disease ([place])   Discharge Condition: Stable and improved.  Patient discharged home with instruction to follow-up with PCP in 10 days and with cardiology service in 2 weeks.  Diet recommendation: Heart healthy/modified carbohydrate diet.  Filed Weights   02/03/19 1917 02/03/19 1930  Weight: 106.1 kg 106.1 kg    History of present illness:  As per H&P written by Dr. Marlowe Sax on 02/03/19 69 y.o. male with medical history significant of CAD status post PCI, hypertension, hyperlipidemia, bladder cancer currently on chemo, type 2 diabetes presenting to the ED for evaluation of chest pain. Patient reports having substernal chest pressure with exertion for the past few months.  Every time symptoms resolve in about 10 minutes after he rests and takes nitroglycerin.  Symptoms have been getting progressively worse.  Now he is having chest pain even with minimal exertion.  Today he was trying to put wood into the fireplace and experienced substernal chest pressure again.  No associated dyspnea, diaphoresis, or nausea.  He took nitroglycerin and isosorbide at home which made him feel better.  At present,  he is no longer having chest pain.  States he has a history of bladder cancer which has been excised and is currently receiving chemotherapy.  His oncologist is Dr. Alen Blew.  He has been told he is anemic due to receiving chemotherapy and has received several blood transfusions in the past few months.  He has also been told that his platelet count is low.  Denies hematemesis, hematochezia, or melena.  States his last colonoscopy was 2 years ago and he was told it was normal.  ED Course: Hemodynamically stable.  High-sensitivity troponin 798 >954.  EKG with new ST depressions and T wave inversions in inferior and lateral leads.  Hemoglobin 6.9, previously in the 9 range.  FOBT negative.  Platelet count was previously mildly low but now significantly worse at 47,000.  Creatinine 1.9, no significant change compared to recent labs.  SARS-CoV-2 PCR test negative.  Chest x-ray showing no active cardiopulmonary disease.  Cardiology consulted.  2 units PRBCs ordered.  Hospital Course:  1-NSTEMI -Patient with known history of coronary artery disease status post PCI -Increase high sensitive troponin, new changes on EKG and abnormalities appreciated on 2D echo. -Cardiology was involved and decision was made to pursued medical management.  High risk for poor outcome with cardiac catheterization. -Also acute presentation most likely triggered by demand ischemia in the setting of anemia. -Patient received 2 units of PRBCs during hospitalization with resolution of his chest discomfort. -Medications adjustment has been made following cardiology recommendations, the patient will be discharge on aspirin, Plavix, beta-blocker, adjusted dose of nitrates and as needed nitroglycerin. -Transfusion threshold 8.5 -2D echo will be repeated in 30 days the patient will be seen by cardiology  in 2 weeks after discharge. -No ACE inhibitors/ARB in the setting of acute renal insufficiency on chronic kidney disease  currently.  2-acute on chronic anemia -Appears to be associated with bone marrow suppression in the setting of chronic chemotherapy use -2 units of PRBCs was given with improvement of his hemoglobin above 8.5 range -On presentation hemoglobin was 6.9. -Repeat CBC to follow hemoglobin trend; threshold for transfusion 8.5 given history of coronary artery disease. -No signs of active bleeding appreciated and was denoted safe to resume aspirin and Plavix.  3-insulin-dependent diabetes with nephropathy -A1c 7.4 -Modified carbohydrate diet has been encouraged -Continue home hypoglycemic regimen.  4-acute on chronic renal failure -At baseline patient with chronic kidney disease a stage IIIb -Continue to minimize/avoid nephrotoxic agents -Maintain adequate hydration -Secure stable perfusion and oxygenation with good hemoglobin level. -Repeat basic metabolic panel at follow-up visit to reassess renal function trend.  5-bladder cancer -Continue outpatient follow-up with Dr. Alen Blew -Patient actively receiving chemotherapy.  6-gastroesophageal reflux disease -Started on low-dose Protonix on daily basis.  7-hyperlipidemia -Continue to follow heart healthy diet -Patient advised to start using over-the-counter fish oil to assist with triglycerides and elevation of good cholesterol. -With allergic reactions to the use of statins.  8-acute ischemic systolic heart failure -Mild decrease in ejection fraction to 45 to 50% -Some hypokinesia as well also appreciated on 2D echo (see below for full report) -Patient has been instructed to check his weight on daily basis, follow low-sodium diet and to take medications as prescribed -There was no signs of swelling/fluid overload and at this time no need for diuretics. -ACE/ARB has been kept on hold secondary to acute on chronic renal failure at this time. -Continue the use of beta-blocker. -Outpatient follow-up with cardiology service as  instructed.  Procedures:  See below for x-ray reports  2D echo:   1. Left ventricular ejection fraction, by visual estimation, is 45 to 50%. The left ventricle has normal function. There is mildly increased left ventricular hypertrophy.  2. Moderate hypokinesis of the left ventricular, mid-apical anterior wall.  3. Indeterminate diastolic filling due to E-A fusion.  4. The left ventricle demonstrates regional wall motion abnormalities.  5. Global right ventricle has normal systolic function.The right ventricular size is normal. No increase in right ventricular wall thickness.  6. Left atrial size was normal.  7. Right atrial size was normal.  8. The mitral valve is normal in structure. Mild mitral valve regurgitation. No evidence of mitral stenosis.  9. The tricuspid valve is normal in structure. 10. The aortic valve is normal in structure. Aortic valve regurgitation is not visualized. Mild aortic valve sclerosis without stenosis. 11. The pulmonic valve was normal in structure. Pulmonic valve regurgitation is not visualized. 12. Moderately elevated pulmonary artery systolic pressure. 13. The inferior vena cava is dilated in size with <50% respiratory variability, suggesting right atrial pressure of 15 mmHg. 14. The left ventricular function has worsened. 15. Prior images reviewed side by side.  In comparison to the previous echocardiogram(s): The left ventricular function is worsened. The left ventricular wall motion abnormality is new since 11/23/2018 and consistent with injury/ischemia in the distribution of a large diagonal or ramus intermedius artery.  Consultations:  Cardiology service  Discharge Exam: Vitals:   02/05/19 0336 02/05/19 0803  BP: 104/70 122/62  Pulse: 66 80  Resp: 15 17  Temp: 97.8 F (36.6 C) 98 F (36.7 C)  SpO2: 94% 99%    General: Afebrile, no shortness of breath, no nausea, no vomiting.  Patient reports some mild discomfort in his epigastric area  (intermittently) and associated with eating.  Also reports mild chest discomfort after walking.  No chest pain at rest. Cardiovascular: S1-S2, no rubs, no gallops, no murmurs, no JVD. Respiratory: Good air movement bilaterally, no requiring oxygen supplementation.  Normal respiratory effort. Abdomen: Soft, nontender, nondistended, positive bowel sounds Extremities: No edema, no cyanosis or clubbing.  Discharge Instructions   Discharge Instructions    (HEART FAILURE PATIENTS) Call MD:  Anytime you have any of the following symptoms: 1) 3 pound weight gain in 24 hours or 5 pounds in 1 week 2) shortness of breath, with or without a dry hacking cough 3) swelling in the hands, feet or stomach 4) if you have to sleep on extra pillows at night in order to breathe.   Complete by: As directed    Diet - low sodium heart healthy   Complete by: As directed    Discharge instructions   Complete by: As directed    Take medications as prescribed Follow modify carbohydrates and low-sodium diet Follow-up with cardiology service in 2 weeks as instructed Maintain adequate hydration Arrange follow-up with PCP in 10 days. Continue outpatient follow-up with hematology/oncology service as previously scheduled.     Allergies as of 02/05/2019      Reactions   Lisinopril Other (See Comments)   Severe cough Hair ball in throat   Statins Hives, Other (See Comments)   Other reaction(s): Cough (ALLERGY/intolerance), Myalgias (intolerance) No cholesterol meds   Atorvastatin Other (See Comments)   REACTION: myalgies   Rosuvastatin Itching      Medication List    STOP taking these medications   amLODipine 5 MG tablet Commonly known as: NORVASC     TAKE these medications   aspirin 81 MG EC tablet Take 1 tablet (81 mg total) by mouth daily.   clopidogrel 75 MG tablet Commonly known as: PLAVIX Take 1 tablet (75 mg total) by mouth daily. What changed: how much to take   insulin glargine 100 UNIT/ML  injection Commonly known as: LANTUS Inject 50 Units into the skin at bedtime.   insulin lispro 100 UNIT/ML KwikPen Commonly known as: HUMALOG Inject 15 Units into the skin 2 (two) times daily.   isosorbide mononitrate 120 MG 24 hr tablet Commonly known as: IMDUR Take 1 tablet (120 mg total) by mouth daily. Start taking on: February 06, 2019 What changed:   medication strength  how much to take   Jardiance 25 MG Tabs tablet Generic drug: empagliflozin Take 25 mg by mouth daily.   metoprolol tartrate 50 MG tablet Commonly known as: LOPRESSOR Take 1 tablet (50 mg total) by mouth 2 (two) times daily.   multivitamin with minerals tablet Take 1 tablet by mouth daily.   nitroGLYCERIN 0.4 MG SL tablet Commonly known as: NITROSTAT Place 1 tablet (0.4 mg total) under the tongue every 5 (five) minutes as needed for chest pain.   pantoprazole 20 MG tablet Commonly known as: Protonix Take 1 tablet (20 mg total) by mouth daily.   prochlorperazine 10 MG tablet Commonly known as: COMPAZINE TAKE 1 TABLET BY MOUTH EVERY 6 HOURS AS NEEDED FOR NAUSEA AND VOMITING What changed: See the new instructions.   Repatha SureClick [id number] MG/ML Soaj Generic drug: Evolocumab Inject 1 pen into the skin every 14 (fourteen) days.   tetrahydrozoline 0.05 % ophthalmic solution Place 1 drop into both eyes at bedtime as needed (eye irritation).   THERAWORX GLOVE + FOAM EX Apply 1  application topically 2 (two) times daily as needed (leg cramps).   Trulicity 1.5 0000000 Sopn Generic drug: Dulaglutide Inject 1.5 mg into the skin every Sunday.   Vascepa 1 g capsule Generic drug: icosapent Ethyl Take 2 capsules (2 g total) by mouth 2 (two) times daily.   Voltaren 1 % Gel Generic drug: diclofenac sodium Apply 1 application topically at bedtime as needed (pain.).      Allergies  Allergen Reactions  . Lisinopril Other (See Comments)    Severe cough Hair ball in throat  . Statins Hives and Other  (See Comments)    Other reaction(s): Cough (ALLERGY/intolerance), Myalgias (intolerance) No cholesterol meds  . Atorvastatin Other (See Comments)    REACTION: myalgies  . Rosuvastatin Itching   Follow-up Information    Sun, Yun, MD. Schedule an appointment as soon as possible for a visit in 10 day(s).   Specialty: Internal Medicine Contact information: [address] [place] [address] [phone]        McAlhany, Christopher D, MD .   Specialty: Cardiology Contact information: [address] [place] [address] [phone]           The results of significant diagnostics from this hospitalization (including imaging, microbiology, ancillary and laboratory) are listed below for reference.    Significant Diagnostic Studies: DG Chest Portable 1 View  Result Date: 02/03/2019 CLINICAL DATA:  68 year old male with history of intermittent chest pain for the past 2-3 months. EXAM: PORTABLE CHEST 1 VIEW COMPARISON:  Chest x-ray 11/22/2018. FINDINGS: Lung volumes are low. No consolidative airspace disease. No pleural effusions. No pneumothorax. No pulmonary nodule or mass noted. Pulmonary vasculature and the cardiomediastinal silhouette are within normal limits. Aortic atherosclerosis. IMPRESSION: 1.  No radiographic evidence of acute cardiopulmonary disease. 2. Aortic atherosclerosis. Electronically Signed   By: Daniel  Entrikin M.D.   On: 02/03/2019 20:50   ECHOCARDIOGRAM COMPLETE  Result Date: 02/04/2019   ECHOCARDIOGRAM REPORT   Patient Name:   Chad Yu Date of Exam: 02/04/2019 Medical Rec #:  [id number]       Height:       72.0 in Accession #:    [id number]      Weight:       234.0 lb Date of Birth:  05/02/1950      BSA:          2.28 m Patient Age:    68 years        BP:           109/61 mmHg Patient Gender: M               HR:           80  bpm. Exam Location:  Inpatient Procedure: 2D Echo, Cardiac Doppler, Color Doppler and Intracardiac             Opacification Agent Indications:    Acute myocardial infarction.; R07.9* Chest pain, unspecified  History:        Patient has prior history of Echocardiogram examinations, most                 recent 11/23/2018. Previous Myocardial Infarction and CAD,                 Abnormal ECG, Signs/Symptoms:Dyspnea and Chest Pain; Risk                 Factors:Diabetes. Cancer. Chemotherapy.  Sonographer:    Roseanna Rainbow RDCS Referring Phys: [id number] Shela Leff  Sonographer Comments: Technically difficult study due to poor echo windows and patient is morbidly obese. Image acquisition challenging due to patient body habitus. Study difficult, could not turn light off in room. IMPRESSIONS  1. Left ventricular ejection fraction, by visual estimation, is 45 to 50%. The left ventricle has normal function. There is mildly increased left ventricular hypertrophy.  2. Moderate hypokinesis of the left ventricular, mid-apical anterior wall.  3. Indeterminate diastolic filling due to E-A fusion.  4. The left ventricle demonstrates regional wall motion abnormalities.  5. Global right ventricle has normal systolic function.The right ventricular size is normal. No increase in right ventricular wall thickness.  6. Left atrial size was normal.  7. Right atrial size was normal.  8. The mitral valve is normal in structure. Mild mitral valve regurgitation. No evidence of mitral stenosis.  9. The tricuspid valve is normal in structure. 10. The aortic valve is normal in structure. Aortic valve regurgitation is not visualized. Mild aortic valve sclerosis without stenosis. 11. The pulmonic valve was normal in structure. Pulmonic valve regurgitation is not visualized. 12. Moderately elevated pulmonary artery systolic pressure. 13. The inferior vena cava is dilated in size with <50% respiratory variability, suggesting right atrial pressure of 15 mmHg. 14. The left ventricular function has worsened. 15. Prior images reviewed side by side. In comparison  to the previous echocardiogram(s): The left ventricular function is worsened. The left ventricular wall motion abnormality is new since 11/23/2018 and consistent with injury/ischemia in the distribution of a large diagonal or ramus intermedius artery FINDINGS  Left Ventricle: Left ventricular ejection fraction, by visual estimation, is 45 to 50%. The left ventricle has normal function. Moderate hypokinesis of the left ventricular, mid-apical anterior wall. The left ventricle demonstrates regional wall motion abnormalities. There is mildly increased left ventricular hypertrophy. Concentric left ventricular hypertrophy. Indeterminate diastolic filling due to E-A fusion. Normal left atrial pressure. Right Ventricle: The right ventricular size is normal. No increase in right ventricular wall thickness. Global RV systolic function is has normal systolic function. The tricuspid regurgitant velocity is 3.03 m/s, and with an assumed right atrial pressure  of 15 mmHg, the estimated right ventricular systolic pressure is moderately elevated at 51.7 mmHg. Left Atrium: Left atrial size was normal in size. Right Atrium: Right atrial size was normal in size Pericardium: There is no evidence of pericardial effusion. Mitral Valve: The mitral valve is normal in structure. Mild mitral valve regurgitation. No evidence of mitral valve stenosis by observation. Tricuspid Valve: The tricuspid valve is normal in structure. Tricuspid valve regurgitation is mild. Aortic Valve: The aortic valve is normal in structure. Aortic valve regurgitation is not visualized. Mild aortic valve sclerosis is present, with no evidence of aortic valve stenosis. Pulmonic Valve: The pulmonic valve was normal in structure. Pulmonic valve regurgitation is not visualized. Pulmonic regurgitation is not visualized. Aorta: The aortic root, ascending aorta and aortic arch are all structurally normal, with no evidence of dilitation or obstruction. Venous: The inferior  vena cava is dilated in size with less than 50% respiratory variability, suggesting right atrial pressure of 15 mmHg. IAS/Shunts: No atrial level shunt detected by color flow Doppler. There is no evidence of a patent foramen ovale. No ventricular septal defect is seen or detected. There is no evidence of an atrial septal defect.  LEFT VENTRICLE PLAX 2D LVIDd:         4.90 cm       Diastology LVIDs:         3.10 cm  LV e' lateral:   10.60 cm/s LV PW:         1.20 cm       LV E/e' lateral: 10.1 LV IVS:        1.20 cm       LV e' medial:    8.49 cm/s LVOT diam:     2.00 cm       LV E/e' medial:  12.6 LV SV:         75 ml LV SV Index:   31.86 LVOT Area:     3.14 cm  LV Volumes (MOD) LV area d, A2C:    40.17 cm LV area d, A4C:    38.10 cm LV area s, A2C:    29.60 cm LV area s, A4C:    25.50 cm LV major d, A2C:   9.00 cm LV major d, A4C:   8.48 cm LV major s, A2C:   7.60 cm LV major s, A4C:   7.58 cm LV vol d, MOD A2C: 146.6 ml LV vol d, MOD A4C: 136.0 ml LV vol s, MOD A2C: 92.6 ml LV vol s, MOD A4C: 66.8 ml LV SV MOD A2C:     53.9 ml LV SV MOD A4C:     136.0 ml LV SV MOD BP:      65.9 ml RIGHT VENTRICLE             IVC RV S prime:     16.80 cm/s  IVC diam: 2.70 cm TAPSE (M-mode): 2.0 cm LEFT ATRIUM           Index       RIGHT ATRIUM           Index LA diam:      4.10 cm 1.80 cm/m  RA Area:     11.30 cm LA Vol (A2C): 85.4 ml 37.50 ml/m RA Volume:   25.10 ml  11.02 ml/m LA Vol (A4C): 60.0 ml 26.34 ml/m  AORTIC VALVE LVOT Vmax:   114.00 cm/s LVOT Vmean:  74.600 cm/s LVOT VTI:    0.191 m  AORTA Ao Root diam: 3.20 cm Ao Asc diam:  3.20 cm MITRAL VALVE                         TRICUSPID VALVE MV Area (PHT): 4.31 cm              TR Peak grad:   36.7 mmHg MV PHT:        51.04 msec            TR Vmax:        302.97 cm/s MV Decel Time: 176 msec MV E velocity: 107.00 cm/s 103 cm/s  SHUNTS MV A velocity: 89.20 cm/s  70.3 cm/s Systemic VTI:  0.19 m MV E/A ratio:  1.20        1.5       Systemic Diam: 2.00 cm  Dani Gobble  Croitoru MD Electronically signed by Sanda Klein MD Signature Date/Time: 02/04/2019/12:13:08 PM    Final     Microbiology: Recent Results (from the past 240 hour(s))  Respiratory Panel by RT PCR (Flu A&B, Covid) - Nasopharyngeal Swab     Status: None   Collection Time: 02/03/19  7:48 PM   Specimen: Nasopharyngeal Swab  Result Value Ref Range Status   SARS Coronavirus 2 by RT PCR NEGATIVE NEGATIVE Final    Comment: (NOTE) SARS-CoV-2 target nucleic acids are NOT DETECTED. The  SARS-CoV-2 RNA is generally detectable in upper respiratoy specimens during the acute phase of infection. The lowest concentration of SARS-CoV-2 viral copies this assay can detect is 131 copies/mL. A negative result does not preclude SARS-Cov-2 infection and should not be used as the sole basis for treatment or other patient management decisions. A negative result may occur with  improper specimen collection/handling, submission of specimen other than nasopharyngeal swab, presence of viral mutation(s) within the areas targeted by this assay, and inadequate number of viral copies (<131 copies/mL). A negative result must be combined with clinical observations, patient history, and epidemiological information. The expected result is Negative. Fact Sheet for Patients:  PinkCheek.be Fact Sheet for Healthcare Providers:  GravelBags.it This test is not yet ap proved or cleared by the [place] FDA and  has been authorized for detection and/or diagnosis of SARS-CoV-2 by FDA under an Emergency Use Authorization (EUA). This EUA will remain  in effect (meaning this test can be used) for the duration of the COVID-19 declaration under Section 564(b)(1) of the Act, 21 U.S.C. section 360bbb-3(b)(1), unless the authorization is terminated or revoked sooner.    Influenza A by PCR NEGATIVE NEGATIVE Final   Influenza B by PCR NEGATIVE NEGATIVE Final    Comment:  (NOTE) The Xpert Xpress SARS-CoV-2/FLU/RSV assay is intended as an aid in  the diagnosis of influenza from Nasopharyngeal swab specimens and  should not be used as a sole basis for treatment. Nasal washings and  aspirates are unacceptable for Xpert Xpress SARS-CoV-2/FLU/RSV  testing. Fact Sheet for Patients: PinkCheek.be Fact Sheet for Healthcare Providers: GravelBags.it This test is not yet approved or cleared by the [place] FDA and  has been authorized for detection and/or diagnosis of SARS-CoV-2 by  FDA under an Emergency Use Authorization (EUA). This EUA will remain  in effect (meaning this test can be used) for the duration of the  Covid-19 declaration under Section 564(b)(1) of the Act, 21  U.S.C. section 360bbb-3(b)(1), unless the authorization is  terminated or revoked. Performed at [facility], [place] [address], [place], [place] [address]   MRSA PCR Screening     Status: None   Collection Time: 02/04/19  1:46 AM   Specimen: Nasal Mucosa; Nasopharyngeal  Result Value Ref Range Status   MRSA by PCR NEGATIVE NEGATIVE Final    Comment:        The GeneXpert MRSA Assay (FDA approved for NASAL specimens only), is one component of a comprehensive MRSA colonization surveillance program. It is not intended to diagnose MRSA infection nor to guide or monitor treatment for MRSA infections. Performed at [facility], [place] [address], [place], [place] [address]      Labs: Basic Metabolic Panel: Recent Labs  Lab 02/03/19 1938 02/04/19 0216  NA 139 141  K 4.4 4.5  CL 105 110  CO2 22 22  GLUCOSE 157* 109*  BUN 36* 35*  CREATININE 1.99* 1.88*  CALCIUM 9.1 9.0   CBC: Recent Labs  Lab 02/03/19 1938 02/04/19 0216 02/04/19 0909  WBC 4.3 3.5*  --   HGB 6.9* 7.1* 8.4*  HCT 20.6* 20.7* 24.9*  MCV 94.5 92.0  --   PLT 47* 43*  --     BNP (last 3 results) Recent Labs    11/22/18 1724  BNP 54.2    CBG: Recent Labs  Lab 02/04/19 0606 02/04/19 1128 02/04/19 1559 02/04/19 2108 02/05/19 0610  GLUCAP 93 161* 149* 152* 103*    Signed:  Clifton James  Dyann Kief MD.  [facility] 02/05/2019, 9:53 AM

## 2019-02-05 NOTE — Progress Notes (Signed)
Discharge instructions reviewed with pt.  Copy of instructions given to pt, pt's scripts sent to his pharmacy electronically by MD, pt informed. Discussed CHF, daily weights and when to call MD. Pt states he has already been weighing self daily and monitoring weight.   Pt has called wife and she will be here soon to pick him up, pt instructed to call when she arrives.

## 2019-02-06 LAB — TROPONIN I (HIGH SENSITIVITY): Troponin I (High Sensitivity): 798 ng/L (ref <18)

## 2019-02-18 ENCOUNTER — Ambulatory Visit: Payer: Medicare Other | Admitting: Cardiovascular Disease

## 2019-02-18 ENCOUNTER — Other Ambulatory Visit: Payer: Medicare Other

## 2019-02-18 NOTE — Progress Notes (Signed)
Cardiology Office Note    Date:  02/19/2019   ID:  Chad Yu, DOB 1950/08/29, MRN [id number]  PCP:  [name], MD  Cardiologist: Lauree Chandler, MD EPS: None  Chief Complaint  Patient presents with  . Hospitalization Follow-up    History of Present Illness:  Chad Yu is a 69 y.o. male with history of CAD status post PCI to diagonal, last cath 2012 patent stent but near occlusion prior to the stent no intervention done because of CKD hypertension, HLD, bladder CA  Patient was seen in the hospital 02/03/2019 with NSTEMI related to severe anemia with marked improvement after transfusions.  EKG changes were more pronounced than they were in 01-23-23 when he had a similar degree of anemia and his high-sensitivity troponins were increased but felt to be demand ischemia.  He was not a good candidate for any invasive cardiac procedures in the setting of marked thrombocytopenia and advanced CKD.   Echo 02/04/2019 LVEF 45 to 50% with mild LVH and moderate hypokinesis of the left ventricle mid apical anterior wall.  LV function reduced since echo 11/2018.  Patient has had some chest heaviness when walking or active-occurs most days -when bringing wood in,he has to stop and rest.Not as bad as in the hospital. Hgb 8.4 at discharge. Having another one checked this Thurs. Had blood work at [facility] last Friday but missed their call yesterday. Amlodipine stopped in the hospital-he's not sure why and chart isn't clear. BP stable today so will restart at 5 mg daily.  Past Medical History:  Diagnosis Date  . Bladder tumor   . Cancer (HCC)    BLADDER CANCER  . Coronary artery disease cardiologist-- dr [name] (per pt last office visit 10/ 2018)   s/p  PCI w/ DES x1 to diagonal branch of LAD  . DDD (degenerative disc disease), lumbar   . Degenerative joint disease   . History of concussion    per pt age 16 w/ brief loc--- no residual  . Hyperlipidemia   . Hypertension   . OA  (osteoarthritis)   . S/P drug eluting coronary stent placement 10/24/2007   diagonal branch of LAD  . Type 2 diabetes mellitus treated with insulin ([place])    followed by pcp  . Varicose vein of leg   . Wears glasses     Past Surgical History:  Procedure Laterality Date  . APPENDECTOMY  age 54  . COLONOSCOPY  2012  . CORONARY ANGIOPLASTY  08-26-2010   dr [name]   previous stent patent,  proximal prior to previous stent 99% stenosis;  Unsuccessful multiple attempts PCI, unability to cross with a ballon;  medical management  . CORONARY ANGIOPLASTY WITH STENT PLACEMENT  10-23-2017  dr Darnell Level brodie   positive ischemia on myoview;   40% pLAD and D1 80%,  diagonal branch treated w/ PCI and DES   . EXCISION MIDLINE SUPERIOR THYOID ISTHMUS MASS  11-27-2008   dr Ernesto Rutherford   bening lipoma  . KNEE ARTHROSCOPY Left 01-22-2004   dr Percell Miller  . POSTERIOR LUMBAR FUSION  11-14-2012   dr Christella Noa @[facility]    L3 -- 5  . SHOULDER ARTHROSCOPY Right 2019  . SHOULDER ARTHROSCOPY WITH ROTATOR CUFF REPAIR AND SUBACROMIAL DECOMPRESSION Left 11-12-2009   dr Percell Miller  @[facility]    w/ Labral debridement and DCR  . TONSILLECTOMY  age 41  . TOTAL KNEE ARTHROPLASTY Left 02/17/2016   Procedure: TOTAL KNEE ARTHROPLASTY;  Surgeon: Ninetta Lights, MD;  Location: [facility];  Service:  Orthopedics;  Laterality: Left;  . TOTAL KNEE ARTHROPLASTY Right 02-25-2004   dr Percell Miller  @[facility]   . TRANSURETHRAL RESECTION OF BLADDER TUMOR N/A 08/31/2018   Procedure: TRANSURETHRAL RESECTION OF BLADDER TUMOR (TURBT) WITH INSTILLATION OF POST OPERATIVE CHEMOTHERAPY;  Surgeon: Kathie Rhodes, MD;  Location: [facility];  Service: Urology;  Laterality: N/A;  . VARICOSE VEIN SURGERY  1995    Current Medications: Current Meds  Medication Sig  . aspirin EC 81 MG EC tablet Take 1 tablet (81 mg total) by mouth daily.  . clopidogrel (PLAVIX) 75 MG tablet Take 1 tablet (75 mg total) by mouth daily.  . diclofenac sodium (VOLTAREN) 1 % GEL Apply 1 application topically at  bedtime as needed (pain.).  Marland Kitchen Dulaglutide (TRULICITY) 1.5 0000000 SOPN Inject 1.5 mg into the skin every Sunday.   . empagliflozin (JARDIANCE) 25 MG TABS tablet Take 25 mg by mouth daily.  . Evolocumab (REPATHA SURECLICK) [id number] MG/ML SOAJ Inject 1 pen into the skin every 14 (fourteen) days.  . Homeopathic Products (THERAWORX GLOVE + FOAM EX) Apply 1 application topically 2 (two) times daily as needed (leg cramps).  Marland Kitchen icosapent Ethyl (VASCEPA) 1 g capsule Take 2 capsules (2 g total) by mouth 2 (two) times daily.  . insulin glargine (LANTUS) 100 UNIT/ML injection Inject 50 Units into the skin at bedtime.   . insulin lispro (HUMALOG) 100 UNIT/ML KwikPen Inject 15 Units into the skin 2 (two) times daily.  . isosorbide mononitrate (IMDUR) 120 MG 24 hr tablet Take 1 tablet (120 mg total) by mouth daily.  . metoprolol tartrate (LOPRESSOR) 50 MG tablet Take 1 tablet (50 mg total) by mouth 2 (two) times daily.  . Multiple Vitamins-Minerals (MULTIVITAMIN WITH MINERALS) tablet Take 1 tablet by mouth daily.  . nitroGLYCERIN (NITROSTAT) 0.4 MG SL tablet Place 1 tablet (0.4 mg total) under the tongue every 5 (five) minutes as needed for chest pain.  . pantoprazole (PROTONIX) 20 MG tablet Take 1 tablet (20 mg total) by mouth daily.  . prochlorperazine (COMPAZINE) 10 MG tablet TAKE 1 TABLET BY MOUTH EVERY 6 HOURS AS NEEDED FOR NAUSEA AND VOMITING (Patient taking differently: Take 10 mg by mouth every 6 (six) hours as needed for nausea or vomiting. )  . tetrahydrozoline 0.05 % ophthalmic solution Place 1 drop into both eyes at bedtime as needed (eye irritation).     Allergies:   Lisinopril, Statins, Atorvastatin, and Rosuvastatin   Social History   Socioeconomic History  . Marital status: Married    Spouse name: Not on file  . Number of children: 4  . Years of education: Not on file  . Highest education level: Not on file  Occupational History  . Occupation: [employer]    Employer:  UNEMPLOYED  Tobacco Use  . Smoking status: Never Smoker  . Smokeless tobacco: Never Used  Substance and Sexual Activity  . Alcohol use: No  . Drug use: No  . Sexual activity: Not on file  Other Topics Concern  . Not on file  Social History Narrative   The patient lives in [place] with his wife. He works in Leisure centre manager. He denies tobacco or alcohol abuse . He denies regular exercise.   Social Determinants of Health   Financial Resource Strain:   . Difficulty of Paying Living Expenses: Not on file  Food Insecurity:   . Worried About Charity fundraiser in the Last Year: Not on file  . Ran Out of Food in the Last Year:  Not on file  Transportation Needs:   . Lack of Transportation (Medical): Not on file  . Lack of Transportation (Non-Medical): Not on file  Physical Activity:   . Days of Exercise per Week: Not on file  . Minutes of Exercise per Session: Not on file  Stress:   . Feeling of Stress : Not on file  Social Connections:   . Frequency of Communication with Friends and Family: Not on file  . Frequency of Social Gatherings with Friends and Family: Not on file  . Attends Religious Services: Not on file  . Active Member of Clubs or Organizations: Not on file  . Attends Archivist Meetings: Not on file  . Marital Status: Not on file     Family History:  The patient's   family history includes Heart attack in his brother and father; Hypertension in his father.   ROS:   Please see the history of present illness.    ROS All other systems reviewed and are negative.   PHYSICAL EXAM:   VS:  BP (!) 144/62   Pulse 83   Ht 6' (1.829 m)   Wt 239 lb (108.4 kg)   SpO2 97%   BMI 32.41 kg/m   Physical Exam  GEN: Well nourished, well developed, in no acute distress  Neck: no JVD, carotid bruits, or masses Cardiac:RRR; no murmurs, rubs, or gallops  Respiratory:  clear to auscultation bilaterally, normal work of breathing GI: soft, nontender,  nondistended, + BS Ext: without cyanosis, clubbing, or edema, Good distal pulses bilaterally Neuro:  Alert and Oriented x 3 Psych: euthymic mood, full affect  Wt Readings from Last 3 Encounters:  02/19/19 239 lb (108.4 kg)  02/03/19 234 lb (106.1 kg)  01/24/19 235 lb 2 oz (106.7 kg)      Studies/Labs Reviewed:   EKG:  EKG is not ordered today.    Recent Labs: 11/14/2018: Magnesium 1.9 11/22/2018: B Natriuretic Peptide 54.2 01/24/2019: ALT 47 02/04/2019: BUN 35; Creatinine, Ser 1.88; Hemoglobin 8.4; Platelets 43; Potassium 4.5; Sodium 141   Lipid Panel    Component Value Date/Time   CHOL 223 (H) 11/23/2018 0513   TRIG 237 (H) 11/23/2018 0513   HDL 39 (L) 11/23/2018 0513   CHOLHDL 5.7 11/23/2018 0513   VLDL 47 (H) 11/23/2018 0513   LDLCALC 137 (H) 11/23/2018 0513    Additional studies/ records that were reviewed today include:  2D echo 02/04/2019  1. Left ventricular ejection fraction, by visual estimation, is 45 to  50%. The left ventricle has normal function. There is mildly increased  left ventricular hypertrophy.   2. Moderate hypokinesis of the left ventricular, mid-apical anterior  wall.   3. Indeterminate diastolic filling due to E-A fusion.   4. The left ventricle demonstrates regional wall motion abnormalities.   5. Global right ventricle has normal systolic function.The right  ventricular size is normal. No increase in right ventricular wall  thickness.   6. Left atrial size was normal.   7. Right atrial size was normal.   8. The mitral valve is normal in structure. Mild mitral valve  regurgitation. No evidence of mitral stenosis.   9. The tricuspid valve is normal in structure.  10. The aortic valve is normal in structure. Aortic valve regurgitation is  not visualized. Mild aortic valve sclerosis without stenosis.  11. The pulmonic valve was normal in structure. Pulmonic valve  regurgitation is not visualized.  12. Moderately elevated pulmonary artery  systolic pressure.  13. The  inferior vena cava is dilated in size with <50% respiratory  variability, suggesting right atrial pressure of 15 mmHg.  14. The left ventricular function has worsened.  15. Prior images reviewed side by side.   In comparison to the previous echocardiogram(s): The left ventricular  function is worsened. The left ventricular wall motion abnormality is new  since 11/23/2018 and consistent with injury/ischemia in the distribution  of a large diagonal or ramus  intermedius artery  FINDINGS   Left Ventricle: Left ventricular ejection fraction, by visual estimation,  is 45 to 50%. The left ventricle has normal function. Moderate hypokinesis  of the left ventricular, mid-apical anterior wall. The left ventricle  demonstrates regional wall motion  abnormalities. There is mildly increased left ventricular hypertrophy.  Concentric left ventricular hypertrophy. Indeterminate diastolic filling  due to E-A fusion. Normal left atrial pressure.  Nuclear stress test 01/27/2015   Nuclear stress EF: 62%.   There was no ST segment deviation noted during stress.   The study is normal.   The left ventricular ejection fraction is normal (55-65%). 1. No ischemia or infarction by perfusion imaging.  2. Normal LV systolic function and wall motion.    Echocardiogram 11/23/2018  1. Left ventricular ejection fraction, by visual estimation, is 60 to 65%. The left ventricle has normal function. There is mildly increased left ventricular hypertrophy.  2. Left ventricular diastolic parameters are indeterminate.  3. Global right ventricle has normal systolic function.The right ventricular size is normal. No increase in right ventricular wall thickness.  4. Left atrial size was moderately dilated.  5. Right atrial size was normal.  6. Trivial pericardial effusion is present.  7. The mitral valve is normal in structure. No evidence of mitral valve regurgitation.  8. The tricuspid valve  is normal in structure. Tricuspid valve regurgitation is trivial.  9. There is Mild calcification of the aortic valve.  10. The aortic valve is tricuspid. Aortic valve regurgitation is not visualized.  11. The pulmonic valve was not well visualized. Pulmonic valve regurgitation is not visualized.  12. Normal pulmonary artery systolic pressure.  13. The inferior vena cava is normal in size with <50% respiratory variability, suggesting right atrial pressure of 8 mmHg.      ASSESSMENT:    1. Coronary artery disease involving native coronary artery of native heart without angina pectoris   2. Ischemic cardiomyopathy   3. Essential hypertension   4. Hyperlipidemia, unspecified hyperlipidemia type   5. Stage 3 chronic kidney disease, unspecified whether stage 3a or 3b CKD      PLAN:  In order of problems listed above:  CAD with history of PCI to diagonal in 2012, patent stent but near occlusion prior to the stent on cath in 2012.  NSTEMI 02/03/2019 in the setting of severe anemia with marked improvement after transfusion.  LVEF reduced to 45 to 50% with wall motion abnormalities.  No intervention done because of CKD and marked thrombocytopenia-platelets 43.  Hemoglobin 8.4 discharge.  Patient continues to have daily exertional angina relieved with rest.  He had blood work done at [facility] on Friday we will call for those results.  Amlodipine was stopped in the hospital for unclear reasons.  Blood pressure stable today.  Will restart 5 mg once daily.  He is on Imdur 120 mg daily.  Consider Ranexa in the future.  Ischemic cardiomyopathy with new LV dysfunction on echo LVEF 45 to 50%.  Scheduled for repeat echo 03/08/2019 no heart failure on exam  Essential hypertension blood pressure stable  Hyperlipidemia on Repatha and Vascepa.  Scheduled for lipids in April  CKD stage III creatinine 1.88 at discharge lab work was done on Friday     Medication Adjustments/Labs and Tests  Ordered: Current medicines are reviewed at length with the patient today.  Concerns regarding medicines are outlined above.  Medication changes, Labs and Tests ordered today are listed in the Patient Instructions below. There are no Patient Instructions on file for this visit.   Sumner Boast, PA-C  02/19/2019 11:38 AM    [facility] [address], [place], [place]  [address] Phone: [phone]; Fax: [phone]

## 2019-02-19 ENCOUNTER — Encounter: Payer: Self-pay | Admitting: Physician Assistant

## 2019-02-19 ENCOUNTER — Ambulatory Visit: Payer: Medicare Other | Admitting: Physician Assistant

## 2019-02-19 ENCOUNTER — Other Ambulatory Visit: Payer: Self-pay

## 2019-02-19 VITALS — BP 144/62 | HR 83 | Ht 72.0 in | Wt 239.0 lb

## 2019-02-19 DIAGNOSIS — I10 Essential (primary) hypertension: Secondary | ICD-10-CM

## 2019-02-19 DIAGNOSIS — I2510 Atherosclerotic heart disease of native coronary artery without angina pectoris: Secondary | ICD-10-CM | POA: Diagnosis not present

## 2019-02-19 DIAGNOSIS — E785 Hyperlipidemia, unspecified: Secondary | ICD-10-CM | POA: Diagnosis not present

## 2019-02-19 DIAGNOSIS — I255 Ischemic cardiomyopathy: Secondary | ICD-10-CM

## 2019-02-19 DIAGNOSIS — N183 Chronic kidney disease, stage 3 unspecified: Secondary | ICD-10-CM

## 2019-02-19 MED ORDER — AMLODIPINE BESYLATE 5 MG PO TABS: 5.0000 mg | ORAL_TABLET | Freq: Every day | ORAL | 3 refills | Status: AC

## 2019-02-19 NOTE — Patient Instructions (Signed)
Medication Instructions:  Your physician has recommended you make the following change in your medication:  RESTART Amlodipine (Norvasc) 5 mg once daily  *If you need a refill on your cardiac medications before your next appointment, please call your pharmacy*  Lab Work: None Ordered If you have labs (blood work) drawn today and your tests are completely normal, you will receive your results only by: Marland Kitchen MyChart Message (if you have MyChart) OR . A paper copy in the mail If you have any lab test that is abnormal or we need to change your treatment, we will call you to review the results.   Testing/Procedures: None Ordered   Follow-Up: At [facility], you and your health needs are our priority.  As part of our continuing mission to provide you with exceptional heart care, we have created designated Provider Care Teams.  These Care Teams include your primary Cardiologist (physician) and Advanced Practice Providers (APPs -  Physician Assistants and Nurse Practitioners) who all work together to provide you with the care you need, when you need it.  Your next appointment:   2 month(s) on April 5  The format for your next appointment:   In Person  Provider:   Lauree Chandler, MD

## 2019-02-21 ENCOUNTER — Other Ambulatory Visit: Payer: Self-pay

## 2019-02-21 ENCOUNTER — Inpatient Hospital Stay: Payer: Medicare Other | Attending: Oncology

## 2019-02-21 DIAGNOSIS — K769 Liver disease, unspecified: Secondary | ICD-10-CM | POA: Diagnosis not present

## 2019-02-21 DIAGNOSIS — C679 Malignant neoplasm of bladder, unspecified: Secondary | ICD-10-CM | POA: Diagnosis present

## 2019-02-21 DIAGNOSIS — N289 Disorder of kidney and ureter, unspecified: Secondary | ICD-10-CM | POA: Insufficient documentation

## 2019-02-21 DIAGNOSIS — D649 Anemia, unspecified: Secondary | ICD-10-CM | POA: Diagnosis not present

## 2019-02-21 DIAGNOSIS — D630 Anemia in neoplastic disease: Secondary | ICD-10-CM | POA: Insufficient documentation

## 2019-02-21 LAB — CMP (CANCER CENTER ONLY)
ALT: 42 U/L (ref 0–44)
AST: 29 U/L (ref 15–41)
Albumin: 3.8 g/dL (ref 3.5–5.0)
Alkaline Phosphatase: 106 U/L (ref 38–126)
Anion gap: 11 (ref 5–15)
BUN: 36 mg/dL — ABNORMAL HIGH (ref 8–23)
CO2: 24 mmol/L (ref 22–32)
Calcium: 9.3 mg/dL (ref 8.9–10.3)
Chloride: 102 mmol/L (ref 98–111)
Creatinine: 2.27 mg/dL — ABNORMAL HIGH (ref 0.61–1.24)
GFR, Est AFR Am: 33 mL/min — ABNORMAL LOW (ref >60)
GFR, Estimated: 28 mL/min — ABNORMAL LOW (ref >60)
Glucose, Bld: 229 mg/dL — ABNORMAL HIGH (ref 70–99)
Potassium: 5.3 mmol/L — ABNORMAL HIGH (ref 3.5–5.1)
Sodium: 137 mmol/L (ref 135–145)
Total Bilirubin: 0.5 mg/dL (ref 0.3–1.2)
Total Protein: 7.5 g/dL (ref 6.5–8.1)

## 2019-02-21 LAB — CBC WITH DIFFERENTIAL (CANCER CENTER ONLY)
Abs Immature Granulocytes: 0.08 10*3/uL — ABNORMAL HIGH (ref 0.00–0.07)
Basophils Absolute: 0 10*3/uL (ref 0.0–0.1)
Basophils Relative: 1 %
Eosinophils Absolute: 0.2 10*3/uL (ref 0.0–0.5)
Eosinophils Relative: 4 %
HCT: 34.2 % — ABNORMAL LOW (ref 39.0–52.0)
Hemoglobin: 11.1 g/dL — ABNORMAL LOW (ref 13.0–17.0)
Immature Granulocytes: 2 %
Lymphocytes Relative: 14 %
Lymphs Abs: 0.8 10*3/uL (ref 0.7–4.0)
MCH: 30.6 pg (ref 26.0–34.0)
MCHC: 32.5 g/dL (ref 30.0–36.0)
MCV: 94.2 fL (ref 80.0–100.0)
Monocytes Absolute: 0.7 10*3/uL (ref 0.1–1.0)
Monocytes Relative: 13 %
Neutro Abs: 3.7 10*3/uL (ref 1.7–7.7)
Neutrophils Relative %: 66 %
Platelet Count: 163 10*3/uL (ref 150–400)
RBC: 3.63 MIL/uL — ABNORMAL LOW (ref 4.22–5.81)
RDW: 17.1 % — ABNORMAL HIGH (ref 11.5–15.5)
WBC Count: 5.5 10*3/uL (ref 4.0–10.5)
nRBC: 0 % (ref 0.0–0.2)

## 2019-02-26 ENCOUNTER — Inpatient Hospital Stay: Payer: Medicare Other | Admitting: Oncology

## 2019-02-26 ENCOUNTER — Other Ambulatory Visit: Payer: Self-pay

## 2019-02-26 ENCOUNTER — Telehealth: Payer: Self-pay

## 2019-02-26 VITALS — BP 160/79 | HR 93 | Temp 99.1°F | Resp 19 | Ht 72.0 in | Wt 238.1 lb

## 2019-02-26 DIAGNOSIS — C679 Malignant neoplasm of bladder, unspecified: Secondary | ICD-10-CM | POA: Diagnosis not present

## 2019-02-26 NOTE — Progress Notes (Signed)
Hematology and Oncology Follow Up Visit  Alexandro Line [id number] [birth year] 69 y.o. 02/26/2019 2:55 PM [name], MDSun, [name], MD   Principle Diagnosis: 69 year old man with bladder cancer diagnosed in August 2020.  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       Prior Therapy:   He is status post TURBT completed in August 2020 which showed high-grade urothelial carcinoma with muscle invasion.  Chemotherapy utilizing cisplatin and gemcitabine started on 10/04/2018.  He received gemcitabine 1000 mg per metered square, cisplatin 70 mg per metered square on day 1 and gemcitabine 1000 mg per metered square on day 8 out of 21-day cycle.  He completed 3 cycles of therapy.  Chemotherapy will be switched to carboplatin based regimen because of renal insufficiency.   Carboplatin and gemcitabine first cycle given on 12/06/2018.  He completed 6 cycles of therapy on January 24, 2019.   Current therapy: Active surveillance.    Interim History: Mr. Eble is here for return evaluation.  Since the last visit, he was hospitalized between January 17 and February 05, 2019.  He presented with chest pain and found to have NSTEMI as well as worsening anemia.  He did receive packed red cell transfusion and recovered reasonably well at this time.  He denies any chest pain or shortness of breath.  He denies any difficulty breathing.  Denies any cough or hemoptysis.  He does report occasional flank pain but no hematuria or dysuria.             Medications: Without any changes on review. Current Outpatient Medications  Medication Sig Dispense Refill  . amLODipine (NORVASC) 5 MG tablet Take 1 tablet (5 mg total) by mouth daily. 90 tablet 3  . aspirin EC 81 MG EC tablet Take 1 tablet (81 mg total) by mouth daily.    . clopidogrel (PLAVIX) 75 MG tablet Take 1 tablet (75 mg total) by mouth daily. 90 tablet 2  . diclofenac sodium (VOLTAREN) 1 % GEL Apply 1  application topically at bedtime as needed (pain.).    Marland Kitchen Dulaglutide (TRULICITY) 1.5 OZ/3.0QM SOPN Inject 1.5 mg into the skin every Sunday.     . empagliflozin (JARDIANCE) 25 MG TABS tablet Take 25 mg by mouth daily.    . Evolocumab (REPATHA SURECLICK) 578 MG/ML SOAJ Inject 1 pen into the skin every 14 (fourteen) days. 2 pen 11  . Homeopathic Products (THERAWORX GLOVE + FOAM EX) Apply 1 application topically 2 (two) times daily as needed (leg cramps).    Marland Kitchen icosapent Ethyl (VASCEPA) 1 g capsule Take 2 capsules (2 g total) by mouth 2 (two) times daily. 120 capsule 5  . insulin glargine (LANTUS) 100 UNIT/ML injection Inject 50 Units into the skin at bedtime.     . insulin lispro (HUMALOG) 100 UNIT/ML KwikPen Inject 15 Units into the skin 2 (two) times daily.    . isosorbide mononitrate (IMDUR) 120 MG 24 hr tablet Take 1 tablet (120 mg total) by mouth daily. 30 tablet 1  . metoprolol tartrate (LOPRESSOR) 50 MG tablet Take 1 tablet (50 mg total) by mouth 2 (two) times daily. 180 tablet 3  . Multiple Vitamins-Minerals (MULTIVITAMIN WITH MINERALS) tablet Take 1 tablet by mouth daily.    . nitroGLYCERIN (NITROSTAT) 0.4 MG SL tablet Place 1 tablet (0.4 mg total) under the tongue every 5 (five) minutes as needed for chest pain. 90 tablet 3  . pantoprazole (PROTONIX) 20 MG tablet Take 1 tablet (20 mg  total) by mouth daily. 30 tablet 1  . prochlorperazine (COMPAZINE) 10 MG tablet TAKE 1 TABLET BY MOUTH EVERY 6 HOURS AS NEEDED FOR NAUSEA AND VOMITING (Patient taking differently: Take 10 mg by mouth every 6 (six) hours as needed for nausea or vomiting. ) 30 tablet 0  . tetrahydrozoline 0.05 % ophthalmic solution Place 1 drop into both eyes at bedtime as needed (eye irritation).     No current facility-administered medications for this visit.     Allergies:  Allergies  Allergen Reactions  . Lisinopril Other (See Comments)    Severe cough Hair ball in throat  . Statins Hives and Other (See Comments)     Other reaction(s): Cough (ALLERGY/intolerance), Myalgias (intolerance) No cholesterol meds  . Atorvastatin Other (See Comments)    REACTION: myalgies  . Rosuvastatin Itching       Physical Exam:  Blood pressure (!) 160/79, pulse 93, temperature 99.1 F (37.3 C), temperature source Temporal, resp. rate 19, height 6' (1.829 m), weight 238 lb 1.6 oz (108 kg), SpO2 99 %.    ECOG: 1   General appearance: Alert, awake without any distress. Head: Atraumatic without abnormalities Oropharynx: Without any thrush or ulcers. Eyes: No scleral icterus. Lymph nodes: No lymphadenopathy noted in the cervical, supraclavicular, or axillary nodes Heart:regular rate and rhythm, without any murmurs or gallops.   Lung: Clear to auscultation without any rhonchi, wheezes or dullness to percussion. Abdomin: Soft, nontender without any shifting dullness or ascites. Musculoskeletal: No clubbing or cyanosis. Neurological: No motor or sensory deficits. Skin: No rashes or lesions.        Lab Results: Lab Results  Component Value Date   WBC 5.5 02/21/2019   HGB 11.1 (L) 02/21/2019   HCT 34.2 (L) 02/21/2019   MCV 94.2 02/21/2019   PLT 163 02/21/2019     Chemistry      Component Value Date/Time   NA 137 02/21/2019 1001   K 5.3 (H) 02/21/2019 1001   CL 102 02/21/2019 1001   CO2 24 02/21/2019 1001   BUN 36 (H) 02/21/2019 1001   CREATININE 2.27 (H) 02/21/2019 1001      Component Value Date/Time   CALCIUM 9.3 02/21/2019 1001   ALKPHOS 106 02/21/2019 1001   AST 29 02/21/2019 1001   ALT 42 02/21/2019 1001   BILITOT 0.5 02/21/2019 1001      Impression and Plan:  69 year old with:  1.    High-grade urothelial carcinoma of the bladder diagnosed in August 2020.  He presented with hepatic lesion that is suspicious for metastasis.  He is status post 6 cycles of platinum based chemotherapy that was completed January 2021.  The natural course of this disease was reviewed today and treatment  options moving forward were reiterated.  The plan is to repeat PET scan which will be scheduled in the near future.  And based on these results we will proceed with the appropriate treatment.  He has declined radical cystectomy even if he had complete response.  Consolidative radiation therapy to the bladder reasonable if he has no evidence of systemic disease.  Local therapy to the bladder could also be a possibility versus continuation of systemic therapy predominantly utilizing PD-L1 inhibition.  We will await the results of the PET scan and consider treatment options accordingly.    2. Renal function surveillance:He is creatinine clearance is around 30 cc/min and overall stable.  Continue to monitor post platinum therapy.   3. Goals of care: It is unlikely any treatment would  be curative at this time and any treatment remains palliative.  His disease appears to be incurable.  4.  Anemia: Multifactorial in nature related to previous chemotherapy and renal insufficiency.  Hemoglobin is up to 11 at this time and does not require any intervention.  5. Follow-up:  Will be determined based on the results of his PET scan.  30  minutes was dedicated to this encounter.  Time was spent on reviewing laboratory data, disease status update, treatment options and discussing future plan of care.  Zola Button, MD 2/9/20212:55 PM

## 2019-02-26 NOTE — Telephone Encounter (Signed)
Called patient and made him aware that his PET scan has been scheduled for Monday 03/11/19 at 9 am with arrival at 8:30 am. Patient is aware NPO after midnight except for water and to hold am insulin the day of the scan per radiology scheduler. Patient verbalized understanding.

## 2019-02-27 ENCOUNTER — Telehealth: Payer: Self-pay | Admitting: Oncology

## 2019-02-27 NOTE — Telephone Encounter (Signed)
Scheduled appt per 2/9 los.  Sent a message to HIM pool to get a calendar mailed out. 

## 2019-03-08 ENCOUNTER — Other Ambulatory Visit (HOSPITAL_COMMUNITY): Payer: Medicare Other

## 2019-03-11 ENCOUNTER — Ambulatory Visit (HOSPITAL_COMMUNITY)
Admission: RE | Admit: 2019-03-11 | Discharge: 2019-03-11 | Disposition: A | Discharge status: Home / Self Care | Payer: Medicare Other | Source: Ambulatory Visit | Attending: Oncology | Admitting: Oncology

## 2019-03-11 ENCOUNTER — Other Ambulatory Visit: Payer: Self-pay

## 2019-03-11 DIAGNOSIS — C778 Secondary and unspecified malignant neoplasm of lymph nodes of multiple regions: Secondary | ICD-10-CM | POA: Insufficient documentation

## 2019-03-11 DIAGNOSIS — Z79899 Other long term (current) drug therapy: Secondary | ICD-10-CM | POA: Diagnosis not present

## 2019-03-11 DIAGNOSIS — I700 Atherosclerosis of aorta: Secondary | ICD-10-CM | POA: Diagnosis not present

## 2019-03-11 DIAGNOSIS — C787 Secondary malignant neoplasm of liver and intrahepatic bile duct: Secondary | ICD-10-CM | POA: Insufficient documentation

## 2019-03-11 DIAGNOSIS — C678 Malignant neoplasm of overlapping sites of bladder: Secondary | ICD-10-CM | POA: Insufficient documentation

## 2019-03-11 LAB — GLUCOSE, CAPILLARY: Glucose-Capillary: 122 mg/dL — ABNORMAL HIGH (ref 70–99)

## 2019-03-11 MED ORDER — FLUDEOXYGLUCOSE F - 18 (FDG) INJECTION
12.6100 | Freq: Once | INTRAVENOUS | Status: AC | PRN
  Administered 2019-03-11: 12.61 via INTRAVENOUS

## 2019-03-19 ENCOUNTER — Other Ambulatory Visit: Payer: Self-pay

## 2019-03-19 ENCOUNTER — Ambulatory Visit (HOSPITAL_COMMUNITY): Payer: Medicare Other | Attending: Internal Medicine

## 2019-03-19 DIAGNOSIS — I200 Unstable angina: Secondary | ICD-10-CM | POA: Insufficient documentation

## 2019-03-22 ENCOUNTER — Telehealth: Payer: Self-pay | Admitting: *Deleted

## 2019-03-22 ENCOUNTER — Telehealth: Payer: Self-pay | Admitting: Oncology

## 2019-03-22 NOTE — Telephone Encounter (Signed)
Chad Yu left a message requesting result of CT. States he is having "burning in lower back and low energy". Also states he passed a kidney stone last week.

## 2019-03-22 NOTE — Telephone Encounter (Signed)
I need to discuss this face-to-face.  I sent a scheduling message for him to come on March 8 at 10 AM.

## 2019-03-22 NOTE — Telephone Encounter (Signed)
Scheduled appt per 3/5 sch message - pt is aware of apt date and time

## 2019-03-22 NOTE — Telephone Encounter (Signed)
Notified of message below

## 2019-03-25 ENCOUNTER — Inpatient Hospital Stay: Payer: Medicare Other | Attending: Oncology | Admitting: Oncology

## 2019-03-25 ENCOUNTER — Other Ambulatory Visit: Payer: Self-pay

## 2019-03-25 VITALS — BP 156/97 | HR 84 | Temp 97.7°F | Resp 20 | Wt 236.0 lb

## 2019-03-25 DIAGNOSIS — D649 Anemia, unspecified: Secondary | ICD-10-CM | POA: Diagnosis not present

## 2019-03-25 DIAGNOSIS — D630 Anemia in neoplastic disease: Secondary | ICD-10-CM | POA: Diagnosis not present

## 2019-03-25 DIAGNOSIS — R109 Unspecified abdominal pain: Secondary | ICD-10-CM | POA: Insufficient documentation

## 2019-03-25 DIAGNOSIS — R5383 Other fatigue: Secondary | ICD-10-CM | POA: Diagnosis not present

## 2019-03-25 DIAGNOSIS — N289 Disorder of kidney and ureter, unspecified: Secondary | ICD-10-CM | POA: Insufficient documentation

## 2019-03-25 DIAGNOSIS — I25119 Atherosclerotic heart disease of native coronary artery with unspecified angina pectoris: Secondary | ICD-10-CM | POA: Diagnosis not present

## 2019-03-25 DIAGNOSIS — Z79899 Other long term (current) drug therapy: Secondary | ICD-10-CM | POA: Diagnosis not present

## 2019-03-25 DIAGNOSIS — Z5112 Encounter for antineoplastic immunotherapy: Secondary | ICD-10-CM | POA: Insufficient documentation

## 2019-03-25 DIAGNOSIS — C679 Malignant neoplasm of bladder, unspecified: Secondary | ICD-10-CM

## 2019-03-25 DIAGNOSIS — C787 Secondary malignant neoplasm of liver and intrahepatic bile duct: Secondary | ICD-10-CM | POA: Insufficient documentation

## 2019-03-25 DIAGNOSIS — M545 Low back pain: Secondary | ICD-10-CM | POA: Insufficient documentation

## 2019-03-25 DIAGNOSIS — R0609 Other forms of dyspnea: Secondary | ICD-10-CM | POA: Diagnosis not present

## 2019-03-25 NOTE — Progress Notes (Signed)
DISCONTINUE ON PATHWAY REGIMEN - Bladder     A cycle is every 21 days:     Gemcitabine      Cisplatin   **Always confirm dose/schedule in your pharmacy ordering system**  REASON: Disease Progression PRIOR TREATMENT: [id number]: Gemcitabine 1,000 mg/m2 D1, 8 + Cisplatin 70 mg/m2 D1 q21 Days x 4 Cycles TREATMENT RESPONSE: Partial Response (PR)  START ON PATHWAY REGIMEN - Bladder     A cycle is 21 days:     Pembrolizumab   **Always confirm dose/schedule in your pharmacy ordering system**  Patient Characteristics: Advanced/Metastatic Disease, Second Line, FGFR2/FGFR3 Mutation Negative or Unknown, Prior Platinum-Based Therapy and No Prior PD-1/PD-L1 Inhibitor Therapeutic Status: Advanced/Metastatic Disease Line of Therapy: Second Line FGFR2/FGFR3 Mutation Status: Did Not Order Test Intent of Therapy: Non-Curative / Palliative Intent, Discussed with Patient

## 2019-03-25 NOTE — Progress Notes (Signed)
Hematology and Oncology Follow Up Visit  Chad Yu [id number] 1950/02/23 69 y.o. 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    Prior Therapy:   He is status post TURBT completed in August 2020 which showed high-grade urothelial carcinoma with muscle invasion.  Chemotherapy utilizing cisplatin and gemcitabine started on 10/04/2018.  He received gemcitabine 1000 mg per metered square, cisplatin 70 mg per metered square on day 1 and gemcitabine 1000 mg per metered square on day 8 out of 21-day cycle.  He completed 3 cycles of therapy.  Chemotherapy will be switched to carboplatin based regimen because of renal insufficiency.   Carboplatin and gemcitabine first cycle given on 12/06/2018.  He completed 6 cycles of therapy on January 24, 2019.   Current therapy: Under consideration to start salvage therapy.    Interim History: Chad Yu is here for return evaluation.  Since last visit, he reports overall feeling reasonably fair but has reported lower back pain and passed a kidney stone.  He reports his symptoms improved after doing so symptoms of nausea and poor p.o. intake.  His symptoms have improved at this time.  He does report some fatigue and some tiredness.  He does report occasional dyspnea and angina associated with exertion.             Medications: Unchanged on review. Current Outpatient Medications  Medication Sig Dispense Refill  . amLODipine (NORVASC) 5 MG tablet Take 1 tablet (5 mg total) by mouth daily. 90 tablet 3  . aspirin EC 81 MG EC tablet Take 1 tablet (81 mg total) by mouth daily.    . clopidogrel (PLAVIX) 75 MG tablet Take 1 tablet (75 mg total) by mouth daily. 90 tablet 2  . diclofenac sodium (VOLTAREN) 1 % GEL Apply 1 application topically at bedtime as needed (pain.).    Marland Kitchen Dulaglutide (TRULICITY)  1.5 0000000 SOPN Inject 1.5 mg into the skin every Sunday.     . empagliflozin (JARDIANCE) 25 MG TABS tablet Take 25 mg by mouth daily.    . Evolocumab (REPATHA SURECLICK) [id number] MG/ML SOAJ Inject 1 pen into the skin every 14 (fourteen) days. 2 pen 11  . Homeopathic Products (THERAWORX GLOVE + FOAM EX) Apply 1 application topically 2 (two) times daily as needed (leg cramps).    Marland Kitchen icosapent Ethyl (VASCEPA) 1 g capsule Take 2 capsules (2 g total) by mouth 2 (two) times daily. 120 capsule 5  . insulin glargine (LANTUS) 100 UNIT/ML injection Inject 50 Units into the skin at bedtime.     . insulin lispro (HUMALOG) 100 UNIT/ML KwikPen Inject 15 Units into the skin 2 (two) times daily.    . isosorbide mononitrate (IMDUR) 120 MG 24 hr tablet Take 1 tablet (120 mg total) by mouth daily. 30 tablet 1  . metoprolol tartrate (LOPRESSOR) 50 MG tablet Take 1 tablet (50 mg total) by mouth 2 (two) times daily. 180 tablet 3  . Multiple Vitamins-Minerals (MULTIVITAMIN WITH MINERALS) tablet Take 1 tablet by mouth daily.    . nitroGLYCERIN (NITROSTAT) 0.4 MG SL tablet Place 1 tablet (0.4 mg total) under the tongue every 5 (five) minutes as needed for chest pain. 90 tablet 3  . pantoprazole (PROTONIX) 20 MG tablet Take 1 tablet (20 mg total) by mouth daily. 30 tablet 1  . prochlorperazine (COMPAZINE) 10 MG tablet TAKE 1 TABLET BY MOUTH EVERY 6  HOURS AS NEEDED FOR NAUSEA AND VOMITING (Patient taking differently: Take 10 mg by mouth every 6 (six) hours as needed for nausea or vomiting. ) 30 tablet 0  . tetrahydrozoline 0.05 % ophthalmic solution Place 1 drop into both eyes at bedtime as needed (eye irritation).     No current facility-administered medications for this visit.     Allergies:  Allergies  Allergen Reactions  . Lisinopril Other (See Comments)    Severe cough Hair ball in throat  . Statins Hives and Other (See Comments)    Other reaction(s): Cough (ALLERGY/intolerance), Myalgias (intolerance) No  cholesterol meds  . Atorvastatin Other (See Comments)    REACTION: myalgies  . Rosuvastatin Itching       Physical Exam:  Blood pressure (!) 156/97, pulse 84, temperature 97.7 F (36.5 C), temperature source Temporal, resp. rate 20, weight 236 lb (107 kg), SpO2 100 %.    ECOG: 1   General appearance: Comfortable appearing without any discomfort Head: Normocephalic without any trauma Oropharynx: Mucous membranes are moist and pink without any thrush or ulcers. Eyes: Pupils are equal and round reactive to light. Lymph nodes: No cervical, supraclavicular, inguinal or axillary lymphadenopathy.   Heart:regular rate and rhythm.  S1 and S2 without leg edema. Lung: Clear without any rhonchi or wheezes.  No dullness to percussion. Abdomin: Soft, nontender, nondistended with good bowel sounds.  No hepatosplenomegaly. Musculoskeletal: No joint deformity or effusion.  Full range of motion noted. Neurological: No deficits noted on motor, sensory and deep tendon reflex exam. Skin: No petechial rash or dryness.  Appeared moist.         Lab Results: Lab Results  Component Value Date   WBC 5.5 02/21/2019   HGB 11.1 (L) 02/21/2019   HCT 34.2 (L) 02/21/2019   MCV 94.2 02/21/2019   PLT 163 02/21/2019     Chemistry      Component Value Date/Time   NA 137 02/21/2019 1001   K 5.3 (H) 02/21/2019 1001   CL 102 02/21/2019 1001   CO2 24 02/21/2019 1001   BUN 36 (H) 02/21/2019 1001   CREATININE 2.27 (H) 02/21/2019 1001      Component Value Date/Time   CALCIUM 9.3 02/21/2019 1001   ALKPHOS 106 02/21/2019 1001   AST 29 02/21/2019 1001   ALT 42 02/21/2019 1001   BILITOT 0.5 02/21/2019 1001      Impression and Plan:  69 year old with:  1.    Stage IV high-grade urothelial carcinoma of the bladder with hepatic metastasis since August 2020.    PET CT scan obtained on March 11, 2019 was personally reviewed and showed progression of disease with worsening hepatic metastasis as  well as lymphadenopathy.  The natural course of this disease was reviewed again and salvage treatment options were reiterated.  His options would include immunotherapy, Padcev or FGFR targeted therapy if he harbors the appropriate mutation.  After discussion today, we have opted to proceed with salvage Pembrolizumab at this time.  Complication associated with this therapy include nausea and fatigue, rash and immune mediated complications.  Therapy will start in the immediate future.   2. Renal function surveillance:He does have some element of hydronephrosis.  I have urged him to follow-up with urology for evaluation and possible cystoscopy and stent placement.  We will alert Dr. Karsten Ro about these findings.   3. Goals of care: Therapy is palliative at this time.  His disease is incurable and the best we can hope for is to palliate his  symptoms for the time being.  4.  Anemia: Related to malignancy and renal insufficiency.  We will continue to monitor and transfuse as needed.  5.  Coronary artery disease: Limited intervention option given his renal failure and need for dye for intervention.  He has follow-up with cardiology.  6.  Immune mediated complications: Continue to educate him about these possibilities including pneumonitis, colitis, skin rash and thyroid disease.  6. Follow-up:  In the near future to start therapy.  30  minutes were spent on this visit.  The time was dedicated to reviewing imaging studies, disease treatment options and future plan of care discussion clinic prognosis. Zola Button, MD 3/8/202110:14 AM

## 2019-03-26 ENCOUNTER — Telehealth: Payer: Self-pay | Admitting: Oncology

## 2019-03-26 NOTE — Telephone Encounter (Signed)
Scheduled appt per 3/8 los.  Spoke with pt and she is aware of the appt date and time 

## 2019-04-03 ENCOUNTER — Inpatient Hospital Stay: Payer: Medicare Other

## 2019-04-03 ENCOUNTER — Telehealth: Payer: Self-pay

## 2019-04-03 ENCOUNTER — Ambulatory Visit (HOSPITAL_BASED_OUTPATIENT_CLINIC_OR_DEPARTMENT_OTHER): Payer: Medicare Other | Admitting: Medical

## 2019-04-03 ENCOUNTER — Other Ambulatory Visit: Payer: Self-pay

## 2019-04-03 VITALS — BP 150/88 | HR 81 | Temp 98.2°F | Resp 20

## 2019-04-03 DIAGNOSIS — K589 Irritable bowel syndrome without diarrhea: Secondary | ICD-10-CM

## 2019-04-03 DIAGNOSIS — C679 Malignant neoplasm of bladder, unspecified: Secondary | ICD-10-CM

## 2019-04-03 DIAGNOSIS — Z5112 Encounter for antineoplastic immunotherapy: Secondary | ICD-10-CM | POA: Diagnosis not present

## 2019-04-03 DIAGNOSIS — R109 Unspecified abdominal pain: Secondary | ICD-10-CM

## 2019-04-03 DIAGNOSIS — C678 Malignant neoplasm of overlapping sites of bladder: Secondary | ICD-10-CM

## 2019-04-03 LAB — CBC WITH DIFFERENTIAL (CANCER CENTER ONLY)
Abs Immature Granulocytes: 0.18 10*3/uL — ABNORMAL HIGH (ref 0.00–0.07)
Basophils Absolute: 0 10*3/uL (ref 0.0–0.1)
Basophils Relative: 0 %
Eosinophils Absolute: 0.2 10*3/uL (ref 0.0–0.5)
Eosinophils Relative: 2 %
HCT: 28 % — ABNORMAL LOW (ref 39.0–52.0)
Hemoglobin: 8.9 g/dL — ABNORMAL LOW (ref 13.0–17.0)
Immature Granulocytes: 2 %
Lymphocytes Relative: 7 %
Lymphs Abs: 0.6 10*3/uL — ABNORMAL LOW (ref 0.7–4.0)
MCH: 30.6 pg (ref 26.0–34.0)
MCHC: 31.8 g/dL (ref 30.0–36.0)
MCV: 96.2 fL (ref 80.0–100.0)
Monocytes Absolute: 0.6 10*3/uL (ref 0.1–1.0)
Monocytes Relative: 7 %
Neutro Abs: 6.8 10*3/uL (ref 1.7–7.7)
Neutrophils Relative %: 82 %
Platelet Count: 201 10*3/uL (ref 150–400)
RBC: 2.91 MIL/uL — ABNORMAL LOW (ref 4.22–5.81)
RDW: 15.5 % (ref 11.5–15.5)
WBC Count: 8.4 10*3/uL (ref 4.0–10.5)
nRBC: 0 % (ref 0.0–0.2)

## 2019-04-03 LAB — CMP (CANCER CENTER ONLY)
ALT: 64 U/L — ABNORMAL HIGH (ref 0–44)
AST: 70 U/L — ABNORMAL HIGH (ref 15–41)
Albumin: 3.1 g/dL — ABNORMAL LOW (ref 3.5–5.0)
Alkaline Phosphatase: 209 U/L — ABNORMAL HIGH (ref 38–126)
Anion gap: 10 (ref 5–15)
BUN: 49 mg/dL — ABNORMAL HIGH (ref 8–23)
CO2: 17 mmol/L — ABNORMAL LOW (ref 22–32)
Calcium: 9.1 mg/dL (ref 8.9–10.3)
Chloride: 109 mmol/L (ref 98–111)
Creatinine: 3.01 mg/dL (ref 0.61–1.24)
GFR, Est AFR Am: 23 mL/min — ABNORMAL LOW (ref >60)
GFR, Estimated: 20 mL/min — ABNORMAL LOW (ref >60)
Glucose, Bld: 128 mg/dL — ABNORMAL HIGH (ref 70–99)
Potassium: 5.3 mmol/L — ABNORMAL HIGH (ref 3.5–5.1)
Sodium: 136 mmol/L (ref 135–145)
Total Bilirubin: 0.3 mg/dL (ref 0.3–1.2)
Total Protein: 6.8 g/dL (ref 6.5–8.1)

## 2019-04-03 LAB — TSH: TSH: 1.536 u[IU]/mL (ref 0.320–4.118)

## 2019-04-03 MED ORDER — ATROPINE SULFATE 1 MG/ML IJ SOLN: 0.5000 mg | Freq: Once | INTRAMUSCULAR | Status: DC

## 2019-04-03 MED ORDER — DIPHENOXYLATE-ATROPINE 2.5-0.025 MG PO TABS: ORAL_TABLET | ORAL | 1 refills | Status: DC

## 2019-04-03 MED ORDER — SODIUM CHLORIDE 0.9 % IV SOLN
Freq: Once | INTRAVENOUS | Status: AC
  Filled 2019-04-03: qty 250

## 2019-04-03 MED ORDER — SODIUM CHLORIDE 0.9 % IV SOLN
200.0000 mg | Freq: Once | INTRAVENOUS | Status: AC
  Administered 2019-04-03: 200 mg via INTRAVENOUS
  Filled 2019-04-03: qty 8

## 2019-04-03 MED ORDER — ATROPINE SULFATE 1 MG/ML IJ SOLN
0.4000 mg | Freq: Once | INTRAMUSCULAR | Status: AC
  Administered 2019-04-03: 0.4 mg via INTRAVENOUS

## 2019-04-03 MED ORDER — ATROPINE SULFATE 0.4 MG/ML IJ SOLN
INTRAMUSCULAR | Status: AC
  Filled 2019-04-03: qty 1

## 2019-04-03 NOTE — Telephone Encounter (Signed)
Lab called with Scr 3.01. Dr. Alen Blew made aware. Per Dr. Alen Blew, ok to treat today.

## 2019-04-03 NOTE — Progress Notes (Signed)
[facility] Progress Note   Chad Yu [id number] [birth year] 69 y.o.  Chad Yu is managed by Dr. Zola Button  Actively treated with chemotherapy/immunotherapy/hormonal therapy: yes  Current therapy: Keytruda  Last treated: 04/03/2019 (cycle #1)  Next scheduled appointment with provider: 04/24/2019  Assessment: Plan:    Abdominal cramping - Plan: atropine injection 0.5 mg, diphenoxylate-atropine (LOMOTIL) 2.5-0.025 MG tablet  Malignant neoplasm of urinary bladder, unspecified site [facility])   Abdominal cramping: Chad Yu was given atropine 0.5 mg IV x 1 with improvement in his abdominal pain. He was also given a prescription for Lomotil.   High-grade urothelial carcinoma with muscle invasion: Chad Yu is receiving cycle #1 of Keytruda today. He is scheduled to be seen on 04/24/2019.   Please see After Visit Summary for patient specific instructions.  Future Appointments  Date Time Provider [place]  04/22/2019 10:00 AM [facility]  04/22/2019 10:20 AM Burnell Blanks, MD [facility]  04/24/2019 12:30 PM [facility] None  04/24/2019  1:00 PM Wyatt Portela, MD [facility] None  04/24/2019  1:30 PM [facility] INFUSION [facility] None  05/16/2019  8:00 AM [facility] LAB 3 [facility] None  05/16/2019  8:30 AM Shadad, Mathis Dad, MD [facility] None  05/16/2019  9:15 AM [facility] INFUSION [facility] None    No orders of the defined types were placed in this encounter.      Subjective:   Patient ID:  Chad Yu is a 70 y.o. (DOB [birth year]) male.  Chief Complaint: No chief complaint on file.   HPI Chad Yu is a 69 y.o. male with a diagnosis of a high-grade urothelial carcinoma with muscle invasion. He is managed by Dr. Alen Blew and is receiving cycle #1 of Keytruda today.  He was seen in the infusion room today when he reported having ongoing lower  abdominal pain.  He had diarrhea vomiting last week and has been going to the bathroom several times this morning.  He also reports that he believes he is having a kidney stone.  He is on medications to resolve this kidney stone.  He has had an episode of diarrhea this morning but also has had normal stools.  He denies fevers, chills, or sweats.  He was given 0.5 mg of atropine IV x1 while he was in the infusion room today with improvement in his abdominal pain.  He continues to have some mild abdominal pain in his left lateral abdomen.  He reports that his abdominal pain improved after he was dose of atropine and after going to the bathroom and having a bowel movement.  Medications: I have reviewed the patient's current medications.  Allergies:  Allergies  Allergen Reactions  . Lisinopril Other (See Comments)    Severe cough Hair ball in throat  . Statins Hives and Other (See Comments)    Other reaction(s): Cough (ALLERGY/intolerance), Myalgias (intolerance) No cholesterol meds  . Atorvastatin Other (See Comments)    REACTION: myalgies  . Rosuvastatin Itching    Past Medical History:  Diagnosis Date  . Bladder tumor   . Cancer (HCC)    BLADDER CANCER  . Coronary artery disease cardiologist-- dr [name] (per pt last office visit 10/ 2018)   s/p  PCI w/ DES x1 to diagonal branch of LAD  . DDD (degenerative disc disease), lumbar   . Degenerative joint disease   . History of concussion    per pt age 22 w/ brief loc--- no residual  . Hyperlipidemia   .  Hypertension   . OA (osteoarthritis)   . S/P drug eluting coronary stent placement 10/24/2007   diagonal branch of LAD  . Type 2 diabetes mellitus treated with insulin ([place])    followed by pcp  . Varicose vein of leg   . Wears glasses     Past Surgical History:  Procedure Laterality Date  . APPENDECTOMY  age 12  . COLONOSCOPY  2012  . CORONARY ANGIOPLASTY  08-26-2010   dr [name]   previous stent patent,  proximal prior to  previous stent 99% stenosis;  Unsuccessful multiple attempts PCI, unability to cross with a ballon;  medical management  . CORONARY ANGIOPLASTY WITH STENT PLACEMENT  10-23-2017  dr Darnell Level brodie   positive ischemia on myoview;   40% pLAD and D1 80%,  diagonal branch treated w/ PCI and DES   . EXCISION MIDLINE SUPERIOR THYOID ISTHMUS MASS  11-27-2008   dr Ernesto Rutherford   bening lipoma  . KNEE ARTHROSCOPY Left 01-22-2004   dr Percell Miller  . POSTERIOR LUMBAR FUSION  11-14-2012   dr Christella Noa @[facility]    L3 -- 5  . SHOULDER ARTHROSCOPY Right 2019  . SHOULDER ARTHROSCOPY WITH ROTATOR CUFF REPAIR AND SUBACROMIAL DECOMPRESSION Left 11-12-2009   dr Percell Miller  @[facility]    w/ Labral debridement and DCR  . TONSILLECTOMY  age 30  . TOTAL KNEE ARTHROPLASTY Left 02/17/2016   Procedure: TOTAL KNEE ARTHROPLASTY;  Surgeon: Ninetta Lights, MD;  Location: [place];  Service: Orthopedics;  Laterality: Left;  . TOTAL KNEE ARTHROPLASTY Right 02-25-2004   dr Percell Miller  @[facility]   . TRANSURETHRAL RESECTION OF BLADDER TUMOR N/A 08/31/2018   Procedure: TRANSURETHRAL RESECTION OF BLADDER TUMOR (TURBT) WITH INSTILLATION OF POST OPERATIVE CHEMOTHERAPY;  Surgeon: Kathie Rhodes, MD;  Location: [facility];  Service: Urology;  Laterality: N/A;  . VARICOSE VEIN SURGERY  1995    Family History  Problem Relation Age of Onset  . Heart attack Father   . Hypertension Father   . Heart attack Brother     Social History   Socioeconomic History  . Marital status: Married    Spouse name: Not on file  . Number of children: 4  . Years of education: Not on file  . Highest education level: Not on file  Occupational History  . Occupation: [employer]    Employer: UNEMPLOYED  Tobacco Use  . Smoking status: Never Smoker  . Smokeless tobacco: Never Used  Substance and Sexual Activity  . Alcohol use: No  . Drug use: No  . Sexual activity: Not on file  Other Topics Concern  . Not on file  Social History Narrative   The patient lives in  [place] with his wife. He works in Leisure centre manager. He denies tobacco or alcohol abuse . He denies regular exercise.   Social Determinants of Health   Financial Resource Strain:   . Difficulty of Paying Living Expenses:   Food Insecurity:   . Worried About Charity fundraiser in the Last Year:   . Arboriculturist in the Last Year:   Transportation Needs:   . Film/video editor (Medical):   Marland Kitchen Lack of Transportation (Non-Medical):   Physical Activity:   . Days of Exercise per Week:   . Minutes of Exercise per Session:   Stress:   . Feeling of Stress :   Social Connections:   . Frequency of Communication with Friends and Family:   . Frequency of Social Gatherings with Friends and Family:   .  Attends Religious Services:   . Active Member of Clubs or Organizations:   . Attends Archivist Meetings:   Marland Kitchen Marital Status:   Intimate Partner Violence:   . Fear of Current or Ex-Partner:   . Emotionally Abused:   Marland Kitchen Physically Abused:   . Sexually Abused:     Past Medical History, Surgical history, Social history, and Family history were reviewed and updated as appropriate.   Please see review of systems for further details on the patient's review from today.   Review of Systems:  Review of Systems  Constitutional: Negative for appetite change, chills, diaphoresis and fever.  HENT: Negative for trouble swallowing.   Respiratory: Negative for cough, chest tightness and shortness of breath.   Cardiovascular: Negative for chest pain, palpitations and leg swelling.  Gastrointestinal: Positive for abdominal pain and diarrhea. Negative for abdominal distention, blood in stool, constipation, nausea and vomiting.  Genitourinary: Negative for decreased urine volume, difficulty urinating, flank pain and frequency.  Neurological: Negative for weakness.    Objective:   Physical Exam:  There were no vitals taken for this visit. ECOG: 0  Physical  Exam Constitutional:      General: He is not in acute distress.    Appearance: He is not diaphoretic.  HENT:     Head: Normocephalic and atraumatic.  Eyes:     General: No scleral icterus.       Right eye: No discharge.        Left eye: No discharge.  Cardiovascular:     Rate and Rhythm: Normal rate and regular rhythm.     Heart sounds: Normal heart sounds. No murmur. No friction rub. No gallop.   Pulmonary:     Effort: Pulmonary effort is normal. No respiratory distress.     Breath sounds: Normal breath sounds. No wheezing or rales.  Abdominal:     General: Bowel sounds are normal. There is no distension.     Tenderness: There is abdominal tenderness (Mild left lateral abdominal pain.). There is no guarding.  Skin:    General: Skin is warm and dry.     Findings: No erythema or rash.  Neurological:     Mental Status: He is alert.  Psychiatric:        Mood and Affect: Mood normal.        Behavior: Behavior normal.        Thought Content: Thought content normal.        Judgment: Judgment normal.     Lab Review:     Component Value Date/Time   NA 136 04/03/2019 0753   K 5.3 (H) 04/03/2019 0753   CL 109 04/03/2019 0753   CO2 17 (L) 04/03/2019 0753   GLUCOSE 128 (H) 04/03/2019 0753   BUN 49 (H) 04/03/2019 0753   CREATININE 3.01 (HH) 04/03/2019 0753   CALCIUM 9.1 04/03/2019 0753   PROT 6.8 04/03/2019 0753   ALBUMIN 3.1 (L) 04/03/2019 0753   AST 70 (H) 04/03/2019 0753   ALT 64 (H) 04/03/2019 0753   ALKPHOS 209 (H) 04/03/2019 0753   BILITOT 0.3 04/03/2019 0753   GFRNONAA 20 (L) 04/03/2019 0753   GFRAA 23 (L) 04/03/2019 0753       Component Value Date/Time   WBC 8.4 04/03/2019 0753   WBC 3.5 (L) 02/04/2019 0216   RBC 2.91 (L) 04/03/2019 0753   HGB 8.9 (L) 04/03/2019 0753   HCT 28.0 (L) 04/03/2019 0753   PLT 201 04/03/2019 0753   MCV 96.2  04/03/2019 0753   MCH 30.6 04/03/2019 0753   MCHC 31.8 04/03/2019 0753   RDW 15.5 04/03/2019 0753   LYMPHSABS 0.6 (L)  04/03/2019 0753   MONOABS 0.6 04/03/2019 0753   EOSABS 0.2 04/03/2019 0753   BASOSABS 0.0 04/03/2019 0753   -------------------------------  Imaging from last 24 hours (if applicable):  Radiology interpretation: NM PET Image Restag (PS) Skull Base To Thigh  Result Date: 03/11/2019 CLINICAL DATA:  Subsequent treatment strategy for bladder cancer. EXAM: NUCLEAR MEDICINE PET SKULL BASE TO THIGH TECHNIQUE: 12.6 mCi F-18 FDG was injected intravenously. Full-ring PET imaging was performed from the skull base to thigh after the radiotracer. CT data was obtained and used for attenuation correction and anatomic localization. Fasting blood glucose: 122 mg/dl COMPARISON:  11/30/2018 FINDINGS: Mediastinal blood pool activity: SUV max 3.2 Liver activity: SUV max NA NECK: No hypermetabolic lymph nodes in the neck. Incidental CT findings: none CHEST: No hypermetabolic mediastinal or hilar nodes. No suspicious pulmonary nodules on the CT scan. Incidental CT findings: Coronary artery calcification is evident. Atherosclerotic calcification is noted in the wall of the thoracic aorta. ABDOMEN/PELVIS: Liver metastases have progressed in the interval. Lesion in the dome of the liver measured previously at 1.3 cm now measures 3.6 cm. SUV max = 10.8 today compared to 2.6 previously. A second lesion in the central liver measured at 4.2 cm previously is now 6.8 cm with SUV max = 11.3 compared to 6.3 previously. Multiple new hepatic metastases are evident including 1.7 cm subcapsular lesion in the extreme dome of liver measuring 1.7 cm today with SUV max = 9.1. New hypermetabolic metastatic lymphadenopathy is seen in the hepatoduodenal ligament, retroperitoneal space, and left pelvic sidewall. Index lymph nodes as follows. 1.2 cm short axis hepatoduodenal ligament lymph node ([id number]) is hypermetabolic with SUV max = 6.2. Tiny 8 mm short axis pre caval node on 01/20 7/4 is hypermetabolic with SUV max = 4.5. 10 mm short axis  aortocaval node on 01/30 6/4 is hypermetabolic with SUV max = [id number]. 1.6 cm short axis left pelvic sidewall lymph node is hypermetabolic with SUV max = [id number]. Left-sided hydroureteronephrosis is new in the interval with ureteral obstruction noted at the level of the distal left ureter and UVJ where there is an irregular soft tissue mass measuring approximately 4.2 x 2.5 cm in showing marked hypermetabolism with SUV max = 7.9. Incidental CT findings: Nonobstructing right renal stones evident. There is abdominal aortic atherosclerosis without aneurysm. SKELETON: There is a new hypermetabolic lesion in the posterior left acetabulum measuring 1.9 cm and demonstrating SUV max = 17. A second tiny hypermetabolic bony metastasis is noted in the anterior left iliac bone with SUV max = 3.7, not well demonstrated on CT imaging. Incidental CT findings: Sequelae of lumbar fusion. IMPRESSION: 1. Interval development of moderate left-sided hydroureteronephrosis with obstruction at the level of the distal left ureter/UVJ where a 3 x 4 cm irregular hypermetabolic soft tissue lesion is evident. 2. Interval development of hypermetabolic nodal metastases in the hepatoduodenal ligament, para-aortic space of the abdomen, and left pelvic sidewall. 3. Interval progression of liver metastases with enlargement of previously identified metastases and interval development of new hypermetabolic liver metastases. 4. No evidence for hypermetabolic metastatic disease in the thorax. 5.  Aortic Atherosclerois (ICD10-170.0) Electronically Signed   By: Misty Stanley M.D.   On: 03/11/2019 10:36   ECHOCARDIOGRAM LIMITED  Result Date: 03/19/2019    ECHOCARDIOGRAM LIMITED REPORT   Patient Name:   BRENNYN MODE Date of Exam:  03/19/2019 Medical Rec #:  [id number]       Height:       72.0 in Accession #:    [id number]      Weight:       238.1 lb Date of Birth:  08/15/1950      BSA:          2.294 m Patient Age:    89 years        BP:           144/62 mmHg  Patient Gender: M               HR:           78 bpm. Exam Location:  [facility] Procedure: Cardiac Doppler, Limited Echo, Limited Color Doppler and 3D Echo Indications:    R07.9  History:        Patient has prior history of Echocardiogram examinations, most                 recent 02/04/2019. Ischemic cardiomyopathy, CAD,                 Signs/Symptoms:Chest Pain; Risk Factors:Hypertension and                 Dyslipidemia.  Sonographer:    Coralyn Helling RDCS Referring Phys: [place]  1. Left ventricular ejection fraction, by estimation, is 55 to 60%. The left ventricle has normal function. The left ventricle has no regional wall motion abnormalities. There is moderate left ventricular hypertrophy. Left ventricular diastolic parameters are consistent with Grade I diastolic dysfunction (impaired relaxation). Comparison(s): Changes from prior study are noted. 02/04/2019: LVEF 45-50%, mid to distal anteroapical hypokinesis. FINDINGS  Left Ventricle: Left ventricular ejection fraction, by estimation, is 55 to 60%. The left ventricle has normal function. The left ventricle has no regional wall motion abnormalities. There is moderate left ventricular hypertrophy. Indeterminate filling pressures. Right Ventricle: The tricuspid regurgitant velocity is 2.26 m/s, and with an assumed right atrial pressure of 10 mmHg, the estimated right ventricular systolic pressure is [id number] mmHg. Tricuspid Valve: The tricuspid valve is grossly normal. Tricuspid valve regurgitation is trivial.  LEFT VENTRICLE PLAX 2D LVIDd:         4.55 cm  Diastology LVIDs:         3.20 cm  LV e' lateral:   9.25 cm/s LV PW:         1.30 cm  LV E/e' lateral: 6.9 LV IVS:        1.80 cm  LV e' medial:    4.90 cm/s LVOT diam:     2.00 cm  LV E/e' medial:  13.0 LV SV:         60 LV SV Index:   26 LVOT Area:     3.14 cm  RIGHT VENTRICLE RV S prime:     12.60 cm/s TAPSE (M-mode): 1.5 cm RVSP:           23.4 mmHg LEFT ATRIUM         Index       RIGHT ATRIUM LA diam:    4.20 cm 1.83 cm/m RA Pressure: 3.00 mmHg  AORTIC VALVE LVOT Vmax:   94.20 cm/s LVOT Vmean:  66.300 cm/s LVOT VTI:    0.190 m  AORTA Ao Root diam: 3.60 cm MV E velocity: 63.80 cm/s  TRICUSPID VALVE MV A velocity: 91.30 cm/s  TR Peak grad:   20.4 mmHg MV E/A ratio:  0.70        TR Vmax:        226.00 cm/s                            Estimated RAP:  3.00 mmHg                            RVSP:           23.4 mmHg                             SHUNTS                            Systemic VTI:  0.19 m                            Systemic Diam: 2.00 cm Lyman Bishop MD Electronically signed by Lyman Bishop MD Signature Date/Time: 03/19/2019/5:01:05 PM    Final

## 2019-04-03 NOTE — Progress Notes (Signed)
Per Dr. Alen Blew: OK to treat with Scr 3.01

## 2019-04-03 NOTE — Patient Instructions (Signed)
COVID-19 Vaccine Information can be found at: [email or web address] For questions related to vaccine distribution or appointments, please email [email or web address] or call [phone].   [facility] Discharge Instructions for Patients Receiving Immunotherapy  Today you received the following immunotherapy agents: Pembrolizumab Chad Yu)  To help prevent nausea and vomiting after your treatment, we encourage you to take your nausea medication as directed by your provider.   If you develop nausea and vomiting that is not controlled by your nausea medication, call the clinic.   BELOW ARE SYMPTOMS THAT SHOULD BE REPORTED IMMEDIATELY:  *FEVER GREATER THAN 100.5 F  *CHILLS WITH OR WITHOUT FEVER  NAUSEA AND VOMITING THAT IS NOT CONTROLLED WITH YOUR NAUSEA MEDICATION  *UNUSUAL SHORTNESS OF BREATH  *UNUSUAL BRUISING OR BLEEDING  TENDERNESS IN MOUTH AND THROAT WITH OR WITHOUT PRESENCE OF ULCERS  *URINARY PROBLEMS  *BOWEL PROBLEMS  UNUSUAL RASH Items with * indicate a potential emergency and should be followed up as soon as possible.  Feel free to call the clinic should you have any questions or concerns. The clinic phone number is [phone].  Please show the [facility] at check-in to the Emergency Department and triage nurse.  Pembrolizumab injection What is this medicine? PEMBROLIZUMAB (pem broe liz ue mab) is a monoclonal antibody. It is used to treat certain types of cancer. This medicine may be used for other purposes; ask your health care provider or pharmacist if you have questions. COMMON BRAND NAME(S): Keytruda What should I tell my health care provider before I take this medicine? They need to know if you have any of these conditions:  diabetes  immune system problems  inflammatory bowel disease  liver disease  lung or breathing disease  lupus  received or scheduled to receive  an organ transplant or a stem-cell transplant that uses donor stem cells  an unusual or allergic reaction to pembrolizumab, other medicines, foods, dyes, or preservatives  pregnant or trying to get pregnant  breast-feeding How should I use this medicine? This medicine is for infusion into a vein. It is given by a health care professional in a hospital or clinic setting. A special MedGuide will be given to you before each treatment. Be sure to read this information carefully each time. Talk to your pediatrician regarding the use of this medicine in children. While this drug may be prescribed for children as young as 6 months for selected conditions, precautions do apply. Overdosage: If you think you have taken too much of this medicine contact a poison control center or emergency room at once. NOTE: This medicine is only for you. Do not share this medicine with others. What if I miss a dose? It is important not to miss your dose. Call your doctor or health care professional if you are unable to keep an appointment. What may interact with this medicine? Interactions have not been studied. Give your health care provider a list of all the medicines, herbs, non-prescription drugs, or dietary supplements you use. Also tell them if you smoke, drink alcohol, or use illegal drugs. Some items may interact with your medicine. This list may not describe all possible interactions. Give your health care provider a list of all the medicines, herbs, non-prescription drugs, or dietary supplements you use. Also tell them if you smoke, drink alcohol, or use illegal drugs. Some items may interact with your medicine. What should I watch for while using this medicine? Your condition will be monitored carefully while you are receiving this medicine. You  may need blood work done while you are taking this medicine. Do not become pregnant while taking this medicine or for 4 months after stopping it. Women should inform  their doctor if they wish to become pregnant or think they might be pregnant. There is a potential for serious side effects to an unborn child. Talk to your health care professional or pharmacist for more information. Do not breast-feed an infant while taking this medicine or for 4 months after the last dose. What side effects may I notice from receiving this medicine? Side effects that you should report to your doctor or health care professional as soon as possible:  allergic reactions like skin rash, itching or hives, swelling of the face, lips, or tongue  bloody or black, tarry  breathing problems  changes in vision  chest pain  chills  confusion  constipation  cough  diarrhea  dizziness or feeling faint or lightheaded  fast or irregular heartbeat  fever  flushing  joint pain  low blood counts - this medicine may decrease the number of white blood cells, red blood cells and platelets. You may be at increased risk for infections and bleeding.  muscle pain  muscle weakness  pain, tingling, numbness in the hands or feet  persistent headache  redness, blistering, peeling or loosening of the skin, including inside the mouth  signs and symptoms of high blood sugar such as dizziness; dry mouth; dry skin; fruity breath; nausea; stomach pain; increased hunger or thirst; increased urination  signs and symptoms of kidney injury like trouble passing urine or change in the amount of urine  signs and symptoms of liver injury like dark urine, light-colored stools, loss of appetite, nausea, right upper belly pain, yellowing of the eyes or skin  sweating  swollen lymph nodes  weight loss Side effects that usually do not require medical attention (report to your doctor or health care professional if they continue or are bothersome):  decreased appetite  hair loss  muscle pain  tiredness This list may not describe all possible side effects. Call your doctor for  medical advice about side effects. You may report side effects to FDA at 1-800-FDA-1088. Where should I keep my medicine? This drug is given in a hospital or clinic and will not be stored at home. NOTE: This sheet is a summary. It may not cover all possible information. If you have questions about this medicine, talk to your doctor, pharmacist, or health care provider.  2020 Elsevier/Gold Standard (2018-11-09 18:07:58)  Coronavirus (COVID-19) Are you at risk?  Are you at risk for the Coronavirus (COVID-19)?  To be considered HIGH RISK for Coronavirus (COVID-19), you have to meet the following criteria:  . Traveled to [place], [place], [place], [place] or [place]; or in the [place] to [place], [place], [place], or [place]; and have fever, cough, and shortness of breath within the last 2 weeks of travel OR . Been in close contact with a person diagnosed with COVID-19 within the last 2 weeks and have fever, cough, and shortness of breath . IF YOU DO NOT MEET THESE CRITERIA, YOU ARE CONSIDERED LOW RISK FOR COVID-19.  What to do if you are HIGH RISK for COVID-19?  Marland Kitchen If you are having a medical emergency, call 911. . Seek medical care right away. Before you go to a doctor's office, urgent care or emergency department, call ahead and tell them about your recent travel, contact with someone diagnosed with COVID-19, and your symptoms.  You should receive instructions from your physician's office regarding next steps of care.  . When you arrive at healthcare provider, tell the healthcare staff immediately you have returned from visiting [place], [place], [place], [place] or [place]; or traveled in the [place] to [place], [place], [place], or [place]; in the last two weeks or you have been in close contact with a person diagnosed with COVID-19 in the last 2 weeks.   . Tell the health care staff about your symptoms: fever, cough and shortness of breath. . After you have been seen by  a medical provider, you will be either: o Tested for (COVID-19) and discharged home on quarantine except to seek medical care if symptoms worsen, and asked to  - Stay home and avoid contact with others until you get your results (4-5 days)  - Avoid travel on public transportation if possible (such as bus, train, or airplane) or o Sent to the Emergency Department by EMS for evaluation, COVID-19 testing, and possible admission depending on your condition and test results.  What to do if you are LOW RISK for COVID-19?  Reduce your risk of any infection by using the same precautions used for avoiding the common cold or flu:  Marland Kitchen Wash your hands often with soap and warm water for at least 20 seconds.  If soap and water are not readily available, use an alcohol-based hand sanitizer with at least 60% alcohol.  . If coughing or sneezing, cover your mouth and nose by coughing or sneezing into the elbow areas of your shirt or coat, into a tissue or into your sleeve (not your hands). . Avoid shaking hands with others and consider head nods or verbal greetings only. . Avoid touching your eyes, nose, or mouth with unwashed hands.  . Avoid close contact with people who are sick. . Avoid places or events with large numbers of people in one location, like concerts or sporting events. . Carefully consider travel plans you have or are making. . If you are planning any travel outside or inside the [place], visit the CDC's Travelers' Health webpage for the latest health notices. . If you have some symptoms but not all symptoms, continue to monitor at home and seek medical attention if your symptoms worsen. . If you are having a medical emergency, call 911.   [facility] / e-Visit: [email or web address]         [facility]: [facility]: [id number]                   [facility]  Urgent Care: [phone]

## 2019-04-04 ENCOUNTER — Telehealth: Payer: Self-pay

## 2019-04-04 ENCOUNTER — Telehealth: Payer: Self-pay | Admitting: *Deleted

## 2019-04-04 NOTE — Telephone Encounter (Signed)
Received call back from patient regarding how he is feeling after first treatment.  Patient states he is tired and has body aches.  Informed patient this can be a side effect of the [place] medication.  Patient also stated he has taken the lomotil that was prescribed yesterday and states that it has helped.  Instructed patient to call office with any additional questions or concerns. Patient verbalized understanding.

## 2019-04-08 ENCOUNTER — Telehealth: Payer: Self-pay

## 2019-04-08 ENCOUNTER — Other Ambulatory Visit: Payer: Self-pay

## 2019-04-08 DIAGNOSIS — C679 Malignant neoplasm of bladder, unspecified: Secondary | ICD-10-CM

## 2019-04-08 NOTE — Progress Notes (Signed)
[facility] Progress Note   Plumer Nein [id number] 20-Sep-1950 69 y.o.  Chad Yu is managed by Dr. Zola Button  Actively treated with chemotherapy/immunotherapy/hormonal therapy: yes  Current therapy: Keytruda  Last treated: 04/03/2019 (cycle #1)  Next scheduled appointment with provider: 04/24/2019  Assessment: Plan:    Malignant neoplasm of urinary bladder, unspecified site (HCC)  Epigastric pain - Plan: pantoprazole (PROTONIX) 20 MG tablet  Other fatigue - Plan: ondansetron (ZOFRAN ODT) 4 MG disintegrating tablet  Non-intractable vomiting with nausea, unspecified vomiting type - Plan: ondansetron (ZOFRAN) injection 8 mg, ondansetron (ZOFRAN) 4 MG/2ML injection  Gross hematuria - Plan: Urine Culture, Urinalysis, Complete w Microscopic  Symptomatic anemia - Plan: Sample to Blood Bank, Sample to Blood Bank, Informed Consent Details: Physician/Practitioner Attestation; Transcribe to consent form and obtain patient signature, Type and screen, Care order/instruction, Prepare RBC (crossmatch), Prepare RBC (crossmatch), acetaminophen (TYLENOL) 325 MG tablet, DISCONTINUED: 0.9 %  sodium chloride infusion (Manually program via Guardrails IV Fluids), DISCONTINUED: acetaminophen (TYLENOL) tablet 650 mg, CANCELED: Transfuse RBC   High-grade urothelial carcinoma with muscle invasion: Mr. Woodlee is status post cycle #1 of Keytruda today. He is scheduled to be seen on 04/24/2019.   Epigastric pain the patient was instructed to restart Protonix 20 mg once daily.  A prescription was sent to his pharmacy.  Fatigue and symptomatic anemia.  His CBC returned today with a hemoglobin of 7.8.  The patient was transfused with 1 unit of packed red blood cells today.  Gross hematuria: A urinalysis returned positive for hemoglobin, red blood cells and rare bacteria.  A urine culture is pending.  We will await these results.  Please see After Visit Summary for patient  specific instructions.  Future Appointments  Date Time Provider [place]  04/22/2019 10:00 AM [facility]  04/22/2019 10:20 AM Burnell Blanks, MD [facility]  04/24/2019 12:30 PM [facility] None  04/24/2019  1:00 PM Wyatt Portela, MD [facility] None  04/24/2019  1:30 PM [facility] INFUSION [facility] None  05/16/2019  8:00 AM [facility] 3 [facility] None  05/16/2019  8:30 AM Shadad, Mathis Dad, MD [facility] None  05/16/2019  9:15 AM [facility] INFUSION [facility] None    Orders Placed This Encounter  Procedures  . Urine Culture  . Urinalysis, Complete w Microscopic  . Informed Consent Details: Physician/Practitioner Attestation; Transcribe to consent form and obtain patient signature  . Care order/instruction  . Sample to Blood Bank  . Sample to Blood Bank  . Type and screen  . Prepare RBC (crossmatch)       Subjective:   Patient ID:  Chad Yu is a 69 y.o. (DOB [birth year]) male.  Chief Complaint: No chief complaint on file.   HPI Chad Yu  is a 69 y.o. male with a diagnosis of a high-grade urothelial carcinoma with muscle invasion. He is managed by Dr. Alen Blew and is status post cycle #1 of Keytruda which was dosed on 04/24/2019.  He presents to the clinic today having call our office yesterday to report that since his first Keytruda infusion on 3/17 he has experienced fatigue, coughing, vomiting, and epigastric pain that is stabbing in nature at times.  He has been feeling poorly since around 2 days before his treatment.  He has been having abdominal cramps ongoing fatigue, nausea and vomiting.  He additionally had an episode of hematuria yesterday had passed to large blood clots.  He states that the only medicines that he is taking is  Compazine and insulin.  He has a prescription for Protonix but is not currently taking that medication.  Medications: I have reviewed the patient's  current medications.  Allergies:  Allergies  Allergen Reactions  . Lisinopril Other (See Comments)    Severe cough Hair ball in throat  . Statins Hives and Other (See Comments)    Other reaction(s): Cough (ALLERGY/intolerance), Myalgias (intolerance) No cholesterol meds  . Atorvastatin Other (See Comments)    REACTION: myalgies  . Rosuvastatin Itching    Past Medical History:  Diagnosis Date  . Bladder tumor   . Cancer (HCC)    BLADDER CANCER  . Coronary artery disease cardiologist-- dr [name] (per pt last office visit 10/ 2018)   s/p  PCI w/ DES x1 to diagonal branch of LAD  . DDD (degenerative disc disease), lumbar   . Degenerative joint disease   . History of concussion    per pt age 15 w/ brief loc--- no residual  . Hyperlipidemia   . Hypertension   . OA (osteoarthritis)   . S/P drug eluting coronary stent placement 10/24/2007   diagonal branch of LAD  . Type 2 diabetes mellitus treated with insulin ([place])    followed by pcp  . Varicose vein of leg   . Wears glasses     Past Surgical History:  Procedure Laterality Date  . APPENDECTOMY  age 78  . COLONOSCOPY  2012  . CORONARY ANGIOPLASTY  08-26-2010   dr [name]   previous stent patent,  proximal prior to previous stent 99% stenosis;  Unsuccessful multiple attempts PCI, unability to cross with a ballon;  medical management  . CORONARY ANGIOPLASTY WITH STENT PLACEMENT  10-23-2017  dr Darnell Level brodie   positive ischemia on myoview;   40% pLAD and D1 80%,  diagonal branch treated w/ PCI and DES   . EXCISION MIDLINE SUPERIOR THYOID ISTHMUS MASS  11-27-2008   dr Ernesto Rutherford   bening lipoma  . KNEE ARTHROSCOPY Left 01-22-2004   dr Percell Miller  . POSTERIOR LUMBAR FUSION  11-14-2012   dr Christella Noa @[facility]    L3 -- 5  . SHOULDER ARTHROSCOPY Right 2019  . SHOULDER ARTHROSCOPY WITH ROTATOR CUFF REPAIR AND SUBACROMIAL DECOMPRESSION Left 11-12-2009   dr Percell Miller  @[facility]    w/ Labral debridement and DCR  . TONSILLECTOMY  age 67  . TOTAL KNEE  ARTHROPLASTY Left 02/17/2016   Procedure: TOTAL KNEE ARTHROPLASTY;  Surgeon: Ninetta Lights, MD;  Location: [place];  Service: Orthopedics;  Laterality: Left;  . TOTAL KNEE ARTHROPLASTY Right 02-25-2004   dr Percell Miller  @[facility]   . TRANSURETHRAL RESECTION OF BLADDER TUMOR N/A 08/31/2018   Procedure: TRANSURETHRAL RESECTION OF BLADDER TUMOR (TURBT) WITH INSTILLATION OF POST OPERATIVE CHEMOTHERAPY;  Surgeon: Kathie Rhodes, MD;  Location: [facility];  Service: Urology;  Laterality: N/A;  . VARICOSE VEIN SURGERY  1995    Family History  Problem Relation Age of Onset  . Heart attack Father   . Hypertension Father   . Heart attack Brother     Social History   Socioeconomic History  . Marital status: Married    Spouse name: Not on file  . Number of children: 4  . Years of education: Not on file  . Highest education level: Not on file  Occupational History  . Occupation: [employer]    Employer: UNEMPLOYED  Tobacco Use  . Smoking status: Never Smoker  . Smokeless tobacco: Never Used  Substance and Sexual Activity  . Alcohol use: No  .  Drug use: No  . Sexual activity: Not on file  Other Topics Concern  . Not on file  Social History Narrative   The patient lives in [place] with his wife. He works in Leisure centre manager. He denies tobacco or alcohol abuse . He denies regular exercise.   Social Determinants of Health   Financial Resource Strain:   . Difficulty of Paying Living Expenses:   Food Insecurity:   . Worried About Charity fundraiser in the Last Year:   . Arboriculturist in the Last Year:   Transportation Needs:   . Film/video editor (Medical):   Marland Kitchen Lack of Transportation (Non-Medical):   Physical Activity:   . Days of Exercise per Week:   . Minutes of Exercise per Session:   Stress:   . Feeling of Stress :   Social Connections:   . Frequency of Communication with Friends and Family:   . Frequency of Social Gatherings with Friends and Family:    . Attends Religious Services:   . Active Member of Clubs or Organizations:   . Attends Archivist Meetings:   Marland Kitchen Marital Status:   Intimate Partner Violence:   . Fear of Current or Ex-Partner:   . Emotionally Abused:   Marland Kitchen Physically Abused:   . Sexually Abused:     Past Medical History, Surgical history, Social history, and Family history were reviewed and updated as appropriate.   Please see review of systems for further details on the patient's review from today.   Review of Systems:  Review of Systems  Constitutional: Positive for fatigue. Negative for activity change, appetite change, chills, diaphoresis and fever.  HENT: Negative for trouble swallowing.   Respiratory: Positive for cough. Negative for chest tightness and shortness of breath.   Cardiovascular: Negative for chest pain, palpitations and leg swelling.  Gastrointestinal: Positive for abdominal pain and vomiting. Negative for abdominal distention, constipation, diarrhea and nausea.  Genitourinary: Positive for hematuria. Negative for difficulty urinating, dysuria and frequency.    Objective:   Physical Exam:  BP (!) 148/78 Comment: KarenK Lpn notified  Pulse 87   Temp 98.5 F (36.9 C) (Temporal)   Resp 18   Ht 6' (1.829 m)   Wt 227 lb 8 oz (103.2 kg)   SpO2 99%   BMI 30.85 kg/m  ECOG: 1  Physical Exam Constitutional:      General: He is not in acute distress.    Appearance: He is not diaphoretic.  HENT:     Head: Normocephalic and atraumatic.  Eyes:     General: No scleral icterus.       Right eye: No discharge.        Left eye: No discharge.  Cardiovascular:     Rate and Rhythm: Normal rate and regular rhythm.     Heart sounds: Normal heart sounds. No murmur. No friction rub. No gallop.   Pulmonary:     Effort: Pulmonary effort is normal. No respiratory distress.     Breath sounds: Normal breath sounds. No wheezing or rales.  Abdominal:     General: Bowel sounds are normal. There is  no distension.     Palpations: Abdomen is soft.     Tenderness: There is no abdominal tenderness. There is no guarding or rebound.  Skin:    General: Skin is warm and dry.     Findings: No erythema or rash.  Neurological:     Mental Status: He is alert.  Coordination: Coordination normal.     Gait: Gait normal.  Psychiatric:        Mood and Affect: Mood normal.        Behavior: Behavior normal.        Thought Content: Thought content normal.        Judgment: Judgment normal.     Lab Review:     Component Value Date/Time   NA 133 (L) 04/09/2019 0839   K 5.6 (H) 04/09/2019 0839   CL 103 04/09/2019 0839   CO2 18 (L) 04/09/2019 0839   GLUCOSE 281 (H) 04/09/2019 0839   BUN 57 (H) 04/09/2019 0839   CREATININE 3.61 (HH) 04/09/2019 0839   CALCIUM 8.9 04/09/2019 0839   PROT 6.6 04/09/2019 0839   ALBUMIN 2.6 (L) 04/09/2019 0839   AST 27 04/09/2019 0839   ALT 42 04/09/2019 0839   ALKPHOS 208 (H) 04/09/2019 0839   BILITOT 0.4 04/09/2019 0839   GFRNONAA 16 (L) 04/09/2019 0839   GFRAA 19 (L) 04/09/2019 0839       Component Value Date/Time   WBC 7.8 04/09/2019 0839   WBC 3.5 (L) 02/04/2019 0216   RBC 2.60 (L) 04/09/2019 0839   HGB 7.8 (L) 04/09/2019 0839   HCT 24.1 (L) 04/09/2019 0839   PLT 183 04/09/2019 0839   MCV 92.7 04/09/2019 0839   MCH 30.0 04/09/2019 0839   MCHC 32.4 04/09/2019 0839   RDW 14.9 04/09/2019 0839   LYMPHSABS 0.6 (L) 04/09/2019 0839   MONOABS 0.7 04/09/2019 0839   EOSABS 0.1 04/09/2019 0839   BASOSABS 0.0 04/09/2019 0839   -------------------------------  Imaging from last 24 hours (if applicable):  Radiology interpretation: NM PET Image Restag (PS) Skull Base To Thigh  Result Date: 03/11/2019 CLINICAL DATA:  Subsequent treatment strategy for bladder cancer. EXAM: NUCLEAR MEDICINE PET SKULL BASE TO THIGH TECHNIQUE: 12.6 mCi F-18 FDG was injected intravenously. Full-ring PET imaging was performed from the skull base to thigh after the radiotracer.  CT data was obtained and used for attenuation correction and anatomic localization. Fasting blood glucose: 122 mg/dl COMPARISON:  11/30/2018 FINDINGS: Mediastinal blood pool activity: SUV max 3.2 Liver activity: SUV max NA NECK: No hypermetabolic lymph nodes in the neck. Incidental CT findings: none CHEST: No hypermetabolic mediastinal or hilar nodes. No suspicious pulmonary nodules on the CT scan. Incidental CT findings: Coronary artery calcification is evident. Atherosclerotic calcification is noted in the wall of the thoracic aorta. ABDOMEN/PELVIS: Liver metastases have progressed in the interval. Lesion in the dome of the liver measured previously at 1.3 cm now measures 3.6 cm. SUV max = 10.8 today compared to 2.6 previously. A second lesion in the central liver measured at 4.2 cm previously is now 6.8 cm with SUV max = 11.3 compared to 6.3 previously. Multiple new hepatic metastases are evident including 1.7 cm subcapsular lesion in the extreme dome of liver measuring 1.7 cm today with SUV max = 9.1. New hypermetabolic metastatic lymphadenopathy is seen in the hepatoduodenal ligament, retroperitoneal space, and left pelvic sidewall. Index lymph nodes as follows. 1.2 cm short axis hepatoduodenal ligament lymph node ([id number]) is hypermetabolic with SUV max = 6.2. Tiny 8 mm short axis pre caval node on 01/20 7/4 is hypermetabolic with SUV max = 4.5. 10 mm short axis aortocaval node on 01/30 6/4 is hypermetabolic with SUV max = [id number]. 1.6 cm short axis left pelvic sidewall lymph node is hypermetabolic with SUV max = [id number]. Left-sided hydroureteronephrosis is new in the interval with ureteral obstruction  noted at the level of the distal left ureter and UVJ where there is an irregular soft tissue mass measuring approximately 4.2 x 2.5 cm in showing marked hypermetabolism with SUV max = 7.9. Incidental CT findings: Nonobstructing right renal stones evident. There is abdominal aortic atherosclerosis without aneurysm.  SKELETON: There is a new hypermetabolic lesion in the posterior left acetabulum measuring 1.9 cm and demonstrating SUV max = 17. A second tiny hypermetabolic bony metastasis is noted in the anterior left iliac bone with SUV max = 3.7, not well demonstrated on CT imaging. Incidental CT findings: Sequelae of lumbar fusion. IMPRESSION: 1. Interval development of moderate left-sided hydroureteronephrosis with obstruction at the level of the distal left ureter/UVJ where a 3 x 4 cm irregular hypermetabolic soft tissue lesion is evident. 2. Interval development of hypermetabolic nodal metastases in the hepatoduodenal ligament, para-aortic space of the abdomen, and left pelvic sidewall. 3. Interval progression of liver metastases with enlargement of previously identified metastases and interval development of new hypermetabolic liver metastases. 4. No evidence for hypermetabolic metastatic disease in the thorax. 5.  Aortic Atherosclerois (ICD10-170.0) Electronically Signed   By: Misty Stanley M.D.   On: 03/11/2019 10:36   ECHOCARDIOGRAM LIMITED  Result Date: 03/19/2019    ECHOCARDIOGRAM LIMITED REPORT   Patient Name:   SELESTINO GRAINER Date of Exam: 03/19/2019 Medical Rec #:  [id number]       Height:       72.0 in Accession #:    [id number]      Weight:       238.1 lb Date of Birth:  1950-11-15      BSA:          2.294 m Patient Age:    63 years        BP:           144/62 mmHg Patient Gender: M               HR:           78 bpm. Exam Location:  [facility] Procedure: Cardiac Doppler, Limited Echo, Limited Color Doppler and 3D Echo Indications:    R07.9  History:        Patient has prior history of Echocardiogram examinations, most                 recent 02/04/2019. Ischemic cardiomyopathy, CAD,                 Signs/Symptoms:Chest Pain; Risk Factors:Hypertension and                 Dyslipidemia.  Sonographer:    Coralyn Helling RDCS Referring Phys: [place]  1. Left ventricular ejection fraction,  by estimation, is 55 to 60%. The left ventricle has normal function. The left ventricle has no regional wall motion abnormalities. There is moderate left ventricular hypertrophy. Left ventricular diastolic parameters are consistent with Grade I diastolic dysfunction (impaired relaxation). Comparison(s): Changes from prior study are noted. 02/04/2019: LVEF 45-50%, mid to distal anteroapical hypokinesis. FINDINGS  Left Ventricle: Left ventricular ejection fraction, by estimation, is 55 to 60%. The left ventricle has normal function. The left ventricle has no regional wall motion abnormalities. There is moderate left ventricular hypertrophy. Indeterminate filling pressures. Right Ventricle: The tricuspid regurgitant velocity is 2.26 m/s, and with an assumed right atrial pressure of 10 mmHg, the estimated right ventricular systolic pressure is [id number] mmHg. Tricuspid Valve: The tricuspid valve is grossly normal. Tricuspid valve regurgitation is  trivial.  LEFT VENTRICLE PLAX 2D LVIDd:         4.55 cm  Diastology LVIDs:         3.20 cm  LV e' lateral:   9.25 cm/s LV PW:         1.30 cm  LV E/e' lateral: 6.9 LV IVS:        1.80 cm  LV e' medial:    4.90 cm/s LVOT diam:     2.00 cm  LV E/e' medial:  13.0 LV SV:         60 LV SV Index:   26 LVOT Area:     3.14 cm  RIGHT VENTRICLE RV S prime:     12.60 cm/s TAPSE (M-mode): 1.5 cm RVSP:           23.4 mmHg LEFT ATRIUM         Index      RIGHT ATRIUM LA diam:    4.20 cm 1.83 cm/m RA Pressure: 3.00 mmHg  AORTIC VALVE LVOT Vmax:   94.20 cm/s LVOT Vmean:  66.300 cm/s LVOT VTI:    0.190 m  AORTA Ao Root diam: 3.60 cm MV E velocity: 63.80 cm/s  TRICUSPID VALVE MV A velocity: 91.30 cm/s  TR Peak grad:   20.4 mmHg MV E/A ratio:  0.70        TR Vmax:        226.00 cm/s                            Estimated RAP:  3.00 mmHg                            RVSP:           23.4 mmHg                             SHUNTS                            Systemic VTI:  0.19 m                             Systemic Diam: 2.00 cm Lyman Bishop MD Electronically signed by Lyman Bishop MD Signature Date/Time: 03/19/2019/5:01:05 PM    Final

## 2019-04-08 NOTE — Telephone Encounter (Signed)
Patient called to report that since his first Keytruda infusion on 3/17 he has experienced fatigue, coughing, vomiting, and epigastric pain that is stabbing in nature at times. Sandi Mealy, PA made aware and stated patient could take Prilosec 20 mg bid for the epigastric pain. V.Tanner, PA also offered patient an appointment if he would like to be seen.  Patient verbalized understanding and and requested an appointment in the am of 04/09/19. Patient scheduled for labs at 8:30 followed by a visit in Symptom Management at 9:00 with Sandi Mealy, PA. Patient made aware of all appointments and knows to contact the office if symptoms worsen prior to his appointment in the morning.

## 2019-04-09 ENCOUNTER — Inpatient Hospital Stay: Payer: Medicare Other

## 2019-04-09 ENCOUNTER — Inpatient Hospital Stay (HOSPITAL_BASED_OUTPATIENT_CLINIC_OR_DEPARTMENT_OTHER): Payer: Medicare Other | Admitting: Medical

## 2019-04-09 ENCOUNTER — Telehealth: Payer: Self-pay

## 2019-04-09 ENCOUNTER — Other Ambulatory Visit: Payer: Self-pay

## 2019-04-09 VITALS — BP 148/78 | HR 87 | Temp 98.5°F | Resp 18 | Ht 72.0 in | Wt 227.5 lb

## 2019-04-09 DIAGNOSIS — R1013 Epigastric pain: Secondary | ICD-10-CM

## 2019-04-09 DIAGNOSIS — D649 Anemia, unspecified: Secondary | ICD-10-CM

## 2019-04-09 DIAGNOSIS — Z5112 Encounter for antineoplastic immunotherapy: Secondary | ICD-10-CM | POA: Diagnosis not present

## 2019-04-09 DIAGNOSIS — R310 Gross hematuria: Secondary | ICD-10-CM

## 2019-04-09 DIAGNOSIS — C679 Malignant neoplasm of bladder, unspecified: Secondary | ICD-10-CM | POA: Diagnosis not present

## 2019-04-09 DIAGNOSIS — R5383 Other fatigue: Secondary | ICD-10-CM

## 2019-04-09 DIAGNOSIS — R112 Nausea with vomiting, unspecified: Secondary | ICD-10-CM

## 2019-04-09 DIAGNOSIS — D500 Iron deficiency anemia secondary to blood loss (chronic): Secondary | ICD-10-CM

## 2019-04-09 LAB — CMP (CANCER CENTER ONLY)
ALT: 42 U/L (ref 0–44)
AST: 27 U/L (ref 15–41)
Albumin: 2.6 g/dL — ABNORMAL LOW (ref 3.5–5.0)
Alkaline Phosphatase: 208 U/L — ABNORMAL HIGH (ref 38–126)
Anion gap: 12 (ref 5–15)
BUN: 57 mg/dL — ABNORMAL HIGH (ref 8–23)
CO2: 18 mmol/L — ABNORMAL LOW (ref 22–32)
Calcium: 8.9 mg/dL (ref 8.9–10.3)
Chloride: 103 mmol/L (ref 98–111)
Creatinine: 3.61 mg/dL (ref 0.61–1.24)
GFR, Est AFR Am: 19 mL/min — ABNORMAL LOW (ref >60)
GFR, Estimated: 16 mL/min — ABNORMAL LOW (ref >60)
Glucose, Bld: 281 mg/dL — ABNORMAL HIGH (ref 70–99)
Potassium: 5.6 mmol/L — ABNORMAL HIGH (ref 3.5–5.1)
Sodium: 133 mmol/L — ABNORMAL LOW (ref 135–145)
Total Bilirubin: 0.4 mg/dL (ref 0.3–1.2)
Total Protein: 6.6 g/dL (ref 6.5–8.1)

## 2019-04-09 LAB — CBC WITH DIFFERENTIAL (CANCER CENTER ONLY)
Abs Immature Granulocytes: 0.07 10*3/uL (ref 0.00–0.07)
Basophils Absolute: 0 10*3/uL (ref 0.0–0.1)
Basophils Relative: 0 %
Eosinophils Absolute: 0.1 10*3/uL (ref 0.0–0.5)
Eosinophils Relative: 1 %
HCT: 24.1 % — ABNORMAL LOW (ref 39.0–52.0)
Hemoglobin: 7.8 g/dL — ABNORMAL LOW (ref 13.0–17.0)
Immature Granulocytes: 1 %
Lymphocytes Relative: 8 %
Lymphs Abs: 0.6 10*3/uL — ABNORMAL LOW (ref 0.7–4.0)
MCH: 30 pg (ref 26.0–34.0)
MCHC: 32.4 g/dL (ref 30.0–36.0)
MCV: 92.7 fL (ref 80.0–100.0)
Monocytes Absolute: 0.7 10*3/uL (ref 0.1–1.0)
Monocytes Relative: 9 %
Neutro Abs: 6.3 10*3/uL (ref 1.7–7.7)
Neutrophils Relative %: 81 %
Platelet Count: 183 10*3/uL (ref 150–400)
RBC: 2.6 MIL/uL — ABNORMAL LOW (ref 4.22–5.81)
RDW: 14.9 % (ref 11.5–15.5)
WBC Count: 7.8 10*3/uL (ref 4.0–10.5)
nRBC: 0 % (ref 0.0–0.2)

## 2019-04-09 LAB — URINALYSIS, COMPLETE (UACMP) WITH MICROSCOPIC
Bilirubin Urine: NEGATIVE
Glucose, UA: 150 mg/dL — AB
Ketones, ur: NEGATIVE mg/dL
Leukocytes,Ua: NEGATIVE
Nitrite: NEGATIVE
Protein, ur: NEGATIVE mg/dL
RBC / HPF: >50 RBC/hpf — ABNORMAL HIGH (ref 0–5)
Specific Gravity, Urine: 1.015 (ref 1.005–1.030)
pH: 5 (ref 5.0–8.0)

## 2019-04-09 LAB — SAMPLE TO BLOOD BANK

## 2019-04-09 LAB — MAGNESIUM: Magnesium: 1.9 mg/dL (ref 1.7–2.4)

## 2019-04-09 MED ORDER — PANTOPRAZOLE SODIUM 20 MG PO TBEC: 20.0000 mg | DELAYED_RELEASE_TABLET | Freq: Every day | ORAL | 5 refills | Status: AC

## 2019-04-09 MED ORDER — ACETAMINOPHEN 325 MG PO TABS
ORAL_TABLET | ORAL | Status: AC
  Filled 2019-04-09: qty 2

## 2019-04-09 MED ORDER — ONDANSETRON 4 MG PO TBDP: 4.0000 mg | ORAL_TABLET | Freq: Three times a day (TID) | ORAL | 2 refills | Status: AC | PRN

## 2019-04-09 MED ORDER — ONDANSETRON HCL 4 MG/2ML IJ SOLN
INTRAMUSCULAR | Status: AC
  Filled 2019-04-09: qty 4

## 2019-04-09 MED ORDER — ACETAMINOPHEN 325 MG PO TABS
650.0000 mg | ORAL_TABLET | Freq: Once | ORAL | Status: AC
  Administered 2019-04-09: 650 mg via ORAL

## 2019-04-09 MED ORDER — SODIUM CHLORIDE 0.9% IV SOLUTION
250.0000 mL | Freq: Once | INTRAVENOUS | Status: AC
  Administered 2019-04-09: 250 mL via INTRAVENOUS
  Filled 2019-04-09: qty 250

## 2019-04-09 MED ORDER — ONDANSETRON HCL 4 MG/2ML IJ SOLN
8.0000 mg | Freq: Once | INTRAMUSCULAR | Status: AC
  Administered 2019-04-09: 8 mg via INTRAVENOUS

## 2019-04-09 NOTE — Telephone Encounter (Signed)
Late Entry- Pt here today for fatigue, epigastric pain, nausea, and vomiting. Pt also stated he saw blood in his urine and expelled 2 clots. Urine sample taken. Lucianne Lei notified. Pt denied SOB but stated he was fatigued. Pt seen by Lucianne Lei.Pt's Lab result showed HGB 7.8 Pt will receive 1 unit of RBC today in infusion. 22g IV placed in left hand Pt. Tolerated well and Type and screen drawn.Pt transferred to Infusion to [facility] in section D.

## 2019-04-09 NOTE — Patient Instructions (Signed)

## 2019-04-10 LAB — TYPE AND SCREEN
ABO/RH(D): O POS
Antibody Screen: NEGATIVE
Unit division: 0

## 2019-04-10 LAB — BPAM RBC
Blood Product Expiration Date: 202104202359
ISSUE DATE / TIME: 202103231210
Unit Type and Rh: 5100

## 2019-04-10 LAB — URINE CULTURE: Culture: NO GROWTH

## 2019-04-18 NOTE — Progress Notes (Signed)

## 2019-04-20 ENCOUNTER — Emergency Department (HOSPITAL_COMMUNITY): Payer: Medicare Other

## 2019-04-20 ENCOUNTER — Encounter (HOSPITAL_COMMUNITY): Payer: Self-pay

## 2019-04-20 ENCOUNTER — Other Ambulatory Visit: Payer: Self-pay

## 2019-04-20 ENCOUNTER — Inpatient Hospital Stay (HOSPITAL_COMMUNITY)
Admission: EM | Admit: 2019-04-20 | Discharge: 2019-04-25 | DRG: 656 | Disposition: A | Discharge status: Home Health | Payer: Medicare Other | Attending: Internal Medicine | Admitting: Internal Medicine

## 2019-04-20 DIAGNOSIS — N179 Acute kidney failure, unspecified: Secondary | ICD-10-CM | POA: Diagnosis present

## 2019-04-20 DIAGNOSIS — C679 Malignant neoplasm of bladder, unspecified: Secondary | ICD-10-CM | POA: Diagnosis present

## 2019-04-20 DIAGNOSIS — Z7902 Long term (current) use of antithrombotics/antiplatelets: Secondary | ICD-10-CM

## 2019-04-20 DIAGNOSIS — N400 Enlarged prostate without lower urinary tract symptoms: Secondary | ICD-10-CM | POA: Diagnosis present

## 2019-04-20 DIAGNOSIS — N132 Hydronephrosis with renal and ureteral calculous obstruction: Secondary | ICD-10-CM | POA: Diagnosis present

## 2019-04-20 DIAGNOSIS — Z6828 Body mass index (BMI) 28.0-28.9, adult: Secondary | ICD-10-CM

## 2019-04-20 DIAGNOSIS — Z20822 Contact with and (suspected) exposure to covid-19: Secondary | ICD-10-CM | POA: Diagnosis present

## 2019-04-20 DIAGNOSIS — D631 Anemia in chronic kidney disease: Secondary | ICD-10-CM | POA: Diagnosis present

## 2019-04-20 DIAGNOSIS — C787 Secondary malignant neoplasm of liver and intrahepatic bile duct: Secondary | ICD-10-CM | POA: Diagnosis present

## 2019-04-20 DIAGNOSIS — Z79899 Other long term (current) drug therapy: Secondary | ICD-10-CM | POA: Diagnosis not present

## 2019-04-20 DIAGNOSIS — E1122 Type 2 diabetes mellitus with diabetic chronic kidney disease: Secondary | ICD-10-CM | POA: Diagnosis present

## 2019-04-20 DIAGNOSIS — E785 Hyperlipidemia, unspecified: Secondary | ICD-10-CM | POA: Diagnosis present

## 2019-04-20 DIAGNOSIS — R079 Chest pain, unspecified: Secondary | ICD-10-CM | POA: Diagnosis present

## 2019-04-20 DIAGNOSIS — D6481 Anemia due to antineoplastic chemotherapy: Secondary | ICD-10-CM | POA: Diagnosis not present

## 2019-04-20 DIAGNOSIS — I130 Hypertensive heart and chronic kidney disease with heart failure and stage 1 through stage 4 chronic kidney disease, or unspecified chronic kidney disease: Secondary | ICD-10-CM | POA: Diagnosis present

## 2019-04-20 DIAGNOSIS — E1121 Type 2 diabetes mellitus with diabetic nephropathy: Secondary | ICD-10-CM | POA: Diagnosis not present

## 2019-04-20 DIAGNOSIS — N289 Disorder of kidney and ureter, unspecified: Secondary | ICD-10-CM

## 2019-04-20 DIAGNOSIS — I5032 Chronic diastolic (congestive) heart failure: Secondary | ICD-10-CM | POA: Diagnosis present

## 2019-04-20 DIAGNOSIS — E860 Dehydration: Secondary | ICD-10-CM | POA: Diagnosis present

## 2019-04-20 DIAGNOSIS — Z96653 Presence of artificial knee joint, bilateral: Secondary | ICD-10-CM | POA: Diagnosis present

## 2019-04-20 DIAGNOSIS — E782 Mixed hyperlipidemia: Secondary | ICD-10-CM | POA: Diagnosis not present

## 2019-04-20 DIAGNOSIS — I252 Old myocardial infarction: Secondary | ICD-10-CM

## 2019-04-20 DIAGNOSIS — I2510 Atherosclerotic heart disease of native coronary artery without angina pectoris: Secondary | ICD-10-CM | POA: Diagnosis present

## 2019-04-20 DIAGNOSIS — I10 Essential (primary) hypertension: Secondary | ICD-10-CM | POA: Diagnosis present

## 2019-04-20 DIAGNOSIS — I208 Other forms of angina pectoris: Secondary | ICD-10-CM | POA: Diagnosis not present

## 2019-04-20 DIAGNOSIS — Z7982 Long term (current) use of aspirin: Secondary | ICD-10-CM

## 2019-04-20 DIAGNOSIS — E1165 Type 2 diabetes mellitus with hyperglycemia: Secondary | ICD-10-CM | POA: Diagnosis present

## 2019-04-20 DIAGNOSIS — N1832 Chronic kidney disease, stage 3b: Secondary | ICD-10-CM | POA: Diagnosis present

## 2019-04-20 DIAGNOSIS — E43 Unspecified severe protein-calorie malnutrition: Secondary | ICD-10-CM | POA: Diagnosis present

## 2019-04-20 DIAGNOSIS — Z955 Presence of coronary angioplasty implant and graft: Secondary | ICD-10-CM | POA: Diagnosis not present

## 2019-04-20 DIAGNOSIS — Z794 Long term (current) use of insulin: Secondary | ICD-10-CM | POA: Diagnosis not present

## 2019-04-20 DIAGNOSIS — D649 Anemia, unspecified: Secondary | ICD-10-CM

## 2019-04-20 DIAGNOSIS — E871 Hypo-osmolality and hyponatremia: Secondary | ICD-10-CM | POA: Diagnosis present

## 2019-04-20 DIAGNOSIS — E872 Acidosis: Secondary | ICD-10-CM | POA: Diagnosis not present

## 2019-04-20 DIAGNOSIS — D630 Anemia in neoplastic disease: Secondary | ICD-10-CM | POA: Diagnosis present

## 2019-04-20 DIAGNOSIS — K219 Gastro-esophageal reflux disease without esophagitis: Secondary | ICD-10-CM | POA: Diagnosis present

## 2019-04-20 DIAGNOSIS — Z9049 Acquired absence of other specified parts of digestive tract: Secondary | ICD-10-CM

## 2019-04-20 DIAGNOSIS — I25118 Atherosclerotic heart disease of native coronary artery with other forms of angina pectoris: Secondary | ICD-10-CM | POA: Diagnosis not present

## 2019-04-20 DIAGNOSIS — R161 Splenomegaly, not elsewhere classified: Secondary | ICD-10-CM | POA: Diagnosis present

## 2019-04-20 DIAGNOSIS — R627 Adult failure to thrive: Secondary | ICD-10-CM | POA: Diagnosis present

## 2019-04-20 DIAGNOSIS — T451X5A Adverse effect of antineoplastic and immunosuppressive drugs, initial encounter: Secondary | ICD-10-CM | POA: Diagnosis not present

## 2019-04-20 DIAGNOSIS — Z66 Do not resuscitate: Secondary | ICD-10-CM | POA: Diagnosis present

## 2019-04-20 DIAGNOSIS — E875 Hyperkalemia: Secondary | ICD-10-CM | POA: Diagnosis present

## 2019-04-20 LAB — CBC WITH DIFFERENTIAL/PLATELET
Abs Immature Granulocytes: 0.17 10*3/uL — ABNORMAL HIGH (ref 0.00–0.07)
Basophils Absolute: 0 10*3/uL (ref 0.0–0.1)
Basophils Relative: 0 %
Eosinophils Absolute: 0 10*3/uL (ref 0.0–0.5)
Eosinophils Relative: 0 %
HCT: 26.3 % — ABNORMAL LOW (ref 39.0–52.0)
Hemoglobin: 8.1 g/dL — ABNORMAL LOW (ref 13.0–17.0)
Immature Granulocytes: 2 %
Lymphocytes Relative: 4 %
Lymphs Abs: 0.4 10*3/uL — ABNORMAL LOW (ref 0.7–4.0)
MCH: 29.8 pg (ref 26.0–34.0)
MCHC: 30.8 g/dL (ref 30.0–36.0)
MCV: 96.7 fL (ref 80.0–100.0)
Monocytes Absolute: 0.8 10*3/uL (ref 0.1–1.0)
Monocytes Relative: 8 %
Neutro Abs: 8.7 10*3/uL — ABNORMAL HIGH (ref 1.7–7.7)
Neutrophils Relative %: 86 %
Platelets: 225 10*3/uL (ref 150–400)
RBC: 2.72 MIL/uL — ABNORMAL LOW (ref 4.22–5.81)
RDW: 14.6 % (ref 11.5–15.5)
WBC: 10 10*3/uL (ref 4.0–10.5)
nRBC: 0 % (ref 0.0–0.2)

## 2019-04-20 LAB — BASIC METABOLIC PANEL
Anion gap: 11 (ref 5–15)
BUN: 71 mg/dL — ABNORMAL HIGH (ref 8–23)
CO2: 19 mmol/L — ABNORMAL LOW (ref 22–32)
Calcium: 8.6 mg/dL — ABNORMAL LOW (ref 8.9–10.3)
Chloride: 100 mmol/L (ref 98–111)
Creatinine, Ser: 4.04 mg/dL — ABNORMAL HIGH (ref 0.61–1.24)
GFR calc Af Amer: 16 mL/min — ABNORMAL LOW (ref >60)
GFR calc non Af Amer: 14 mL/min — ABNORMAL LOW (ref >60)
Glucose, Bld: 310 mg/dL — ABNORMAL HIGH (ref 70–99)
Potassium: 5.4 mmol/L — ABNORMAL HIGH (ref 3.5–5.1)
Sodium: 130 mmol/L — ABNORMAL LOW (ref 135–145)

## 2019-04-20 LAB — PROTIME-INR
INR: 1.4 — ABNORMAL HIGH (ref 0.8–1.2)
Prothrombin Time: 16.9 seconds — ABNORMAL HIGH (ref 11.4–15.2)

## 2019-04-20 LAB — TROPONIN I (HIGH SENSITIVITY): Troponin I (High Sensitivity): 21 ng/L — ABNORMAL HIGH (ref <18)

## 2019-04-20 NOTE — ED Notes (Signed)
Called lab to add on magnesium and they advised they will run it.

## 2019-04-20 NOTE — ED Triage Notes (Signed)
Patient in by EMS from home due to the complaints of right sided chest pain, has hx of bladder cancer with mets to the liver, lungs and lymph. Pain has been off and on for the last few days 5/10 pain at worst, receive 1g tylenol per EMS due to a fever of 191f also self administered 325 ASA. Hx of anemia received 1unit PRBC about three weeks ago.

## 2019-04-20 NOTE — H&P (Signed)
[facility] History and Physical  Chad Yu [id number] DOB: [birth year] DOA: 04/20/2019  Referring EDP: Kathrynn Humble PCP: Chad Mariscal, MD   Chief Complaint: Chest pain, weakness   HPI: Chad Yu is a 69 y.o. male with PMH of CKD, anemia, CAD s/p PCI, HTN, HLD, bladder cancer currently on chemo, T2DM presented to ED with chest pain which resolved but admitted for AKI on CKD.  Patient reports that he has felt weak for the past 1-2 weeks and that his appetite has been low. Today he had some chest pain over the right side of his chest while resting in bed. He took two Nitros and it resolved by the time he arrived to the ED. Currently denies any complaint other than vague weakness. He normally walks independently but reports he could not stand up today. Last urinated prior to coming to ER. He has only been eating oatmeal and scrambled eggs for the past 1-2 weeks as he reports nausea and vomiting if he tries to eat anything heavier. Currently denies any abdominal pain, nausea, vomiting. He does report his urine has been slightly pink in color over the last couple days. Denies headache, dizziness, fever, chills, cough, SOB, diarrhea, constipation, dysuria, hematochezia, melena, speech difficulty, confusion or any other complaints.  In the ED: Hemodynamically stable. Labs remarkable for: Na 130, K 5.4, CO2 19, glucose 310, BUN 71, Cr 4.04, Hgb 8.1, WBC 10.0. Initial trop 21.  Renal US: 1. Mild-to-moderate left-sided hydronephrosis. 2. Bilateral nonobstructing nephroliths are noted. 3. The bilateral ureteral jets were not visualized however this may be secondary to underdistention. 4. Incidentally noted splenomegaly. Admission requested for AKI on CKD.  Review of Systems:  All other systems negative unless noted above in HPI.   Past Medical History:  Diagnosis Date  . Bladder tumor   . Cancer (HCC)    BLADDER CANCER  . Coronary artery disease cardiologist-- dr [name] (per pt  last office visit 10/ 2018)   s/p  PCI w/ DES x1 to diagonal branch of LAD  . DDD (degenerative disc disease), lumbar   . Degenerative joint disease   . History of concussion    per pt age 50 w/ brief loc--- no residual  . Hyperlipidemia   . Hypertension   . OA (osteoarthritis)   . S/P drug eluting coronary stent placement 10/24/2007   diagonal branch of LAD  . Type 2 diabetes mellitus treated with insulin ([place])    followed by pcp  . Varicose vein of leg   . Wears glasses    Past Surgical History:  Procedure Laterality Date  . APPENDECTOMY  age 25  . COLONOSCOPY  2012  . CORONARY ANGIOPLASTY  08-26-2010   dr [name]   previous stent patent,  proximal prior to previous stent 99% stenosis;  Unsuccessful multiple attempts PCI, unability to cross with a ballon;  medical management  . CORONARY ANGIOPLASTY WITH STENT PLACEMENT  10-23-2017  dr Darnell Level brodie   positive ischemia on myoview;   40% pLAD and D1 80%,  diagonal branch treated w/ PCI and DES   . EXCISION MIDLINE SUPERIOR THYOID ISTHMUS MASS  11-27-2008   dr Ernesto Rutherford   bening lipoma  . KNEE ARTHROSCOPY Left 01-22-2004   dr Percell Miller  . POSTERIOR LUMBAR FUSION  11-14-2012   dr Christella Noa @[facility]    L3 -- 5  . SHOULDER ARTHROSCOPY Right 2019  . SHOULDER ARTHROSCOPY WITH ROTATOR CUFF REPAIR AND SUBACROMIAL DECOMPRESSION Left 11-12-2009   dr Percell Miller  @[facility]    w/ Labral  debridement and DCR  . TONSILLECTOMY  age 18  . TOTAL KNEE ARTHROPLASTY Left 02/17/2016   Procedure: TOTAL KNEE ARTHROPLASTY;  Surgeon: Ninetta Lights, MD;  Location: [facility];  Service: Orthopedics;  Laterality: Left;  . TOTAL KNEE ARTHROPLASTY Right 02-25-2004   dr Percell Miller  @[facility]   . TRANSURETHRAL RESECTION OF BLADDER TUMOR N/A 08/31/2018   Procedure: TRANSURETHRAL RESECTION OF BLADDER TUMOR (TURBT) WITH INSTILLATION OF POST OPERATIVE CHEMOTHERAPY;  Surgeon: Kathie Rhodes, MD;  Location: [facility];  Service: Urology;  Laterality: N/A;  . VARICOSE VEIN SURGERY  1995   Social History:   reports that he has never smoked. He has never used smokeless tobacco. He reports that he does not drink alcohol or use drugs.  Allergies  Allergen Reactions  . Lisinopril Other (See Comments)    Severe cough Hair ball in throat  . Statins Hives and Other (See Comments)    Other reaction(s): Cough (ALLERGY/intolerance), Myalgias (intolerance) No cholesterol meds  . Atorvastatin Other (See Comments)    REACTION: myalgies  . Rosuvastatin Itching    Family History  Problem Relation Age of Onset  . Heart attack Father   . Hypertension Father   . Heart attack Brother     Prior to Admission medications   Medication Sig Start Date End Date Taking? Authorizing Provider  amLODipine (NORVASC) 5 MG tablet Take 1 tablet (5 mg total) by mouth daily. 02/19/19   Imogene Burn, PA-C  aspirin EC 81 MG EC tablet Take 1 tablet (81 mg total) by mouth daily. 11/26/18   Geradine Girt, DO  clopidogrel (PLAVIX) 75 MG tablet Take 1 tablet (75 mg total) by mouth daily. 02/05/19   Barton Dubois, MD  diclofenac sodium (VOLTAREN) 1 % GEL Apply 1 application topically at bedtime as needed (pain.).    [provider]  diphenoxylate-atropine (LOMOTIL) 2.5-0.025 MG tablet 1 to 2 tablets PO QID prn diarrhea or stomach cramping 04/03/19   Tanner, Lyndon Code., PA-C  Dulaglutide (TRULICITY) 1.5 0000000 SOPN Inject 1.5 mg into the skin every Sunday.     [provider]  empagliflozin (JARDIANCE) 25 MG TABS tablet Take 25 mg by mouth daily.    [provider]  Evolocumab (REPATHA SURECLICK) [id number] MG/ML SOAJ Inject 1 pen into the skin every 14 (fourteen) days. 11/26/18   Burnell Blanks, MD  Homeopathic Products (THERAWORX GLOVE + FOAM EX) Apply 1 application topically 2 (two) times daily as needed (leg cramps).    [provider]  icosapent Ethyl (VASCEPA) 1 g capsule Take 2 capsules (2 g total) by mouth 2 (two) times daily. 12/10/18   Sherren Mocha, MD  insulin glargine (LANTUS)  100 UNIT/ML injection Inject 50 Units into the skin at bedtime.     [provider]  insulin lispro (HUMALOG) 100 UNIT/ML KwikPen Inject 15 Units into the skin 2 (two) times daily. 09/19/18   [provider]  isosorbide mononitrate (IMDUR) 120 MG 24 hr tablet Take 1 tablet (120 mg total) by mouth daily. 02/06/19   Barton Dubois, MD  metoprolol tartrate (LOPRESSOR) 50 MG tablet Take 1 tablet (50 mg total) by mouth 2 (two) times daily. 01/02/19 04/02/19  Burtis Junes, NP  Multiple Vitamins-Minerals (MULTIVITAMIN WITH MINERALS) tablet Take 1 tablet by mouth daily.    [provider]  nitroGLYCERIN (NITROSTAT) 0.4 MG SL tablet Place 1 tablet (0.4 mg total) under the tongue every 5 (five) minutes as needed for chest pain. 12/10/18 03/10/19  Burtis Junes,  NP  ondansetron (ZOFRAN ODT) 4 MG disintegrating tablet Take 1 tablet (4 mg total) by mouth every 8 (eight) hours as needed for nausea or vomiting. 04/09/19   Tanner, Lyndon Code., PA-C  pantoprazole (PROTONIX) 20 MG tablet Take 1 tablet (20 mg total) by mouth daily. 04/09/19 04/08/20  Tanner, Lyndon Code., PA-C  Potassium Citrate 15 MEQ (1620 MG) TBCR Take 1 tablet by mouth 2 (two) times daily. 03/11/19   [provider]  prochlorperazine (COMPAZINE) 10 MG tablet TAKE 1 TABLET BY MOUTH EVERY 6 HOURS AS NEEDED FOR NAUSEA AND VOMITING Patient taking differently: Take 10 mg by mouth every 6 (six) hours as needed for nausea or vomiting.  11/12/18   Wyatt Portela, MD  tetrahydrozoline 0.05 % ophthalmic solution Place 1 drop into both eyes at bedtime as needed (eye irritation).    [provider]  fenofibrate (TRICOR) 145 MG tablet Take 145 mg by mouth daily.    08/02/18  [provider]   Physical Exam: Vitals:   04/20/19 1950 04/20/19 1958 04/20/19 2200  BP: 116/68  115/69  Pulse: 93  77  Resp: 16  18  Temp: 99 F (37.2 C)    TempSrc: Oral    SpO2: 96%  93%  Weight:  103 kg   Height:  6' (1.829 m)     Wt  Readings from Last 3 Encounters:  04/20/19 103 kg  04/09/19 103.2 kg  03/25/19 107 kg    . General:  Appears calm and comfortable. AAOx4.  . Eyes: EOMI, normal lids, irises & conjunctiva . ENT: grossly normal hearing, lips & tongue . Neck: normal ROM . Cardiovascular: RRR, no m/r/g. No LE edema. Marland Kitchen Respiratory: CTA bilaterally, no w/r/r. Normal respiratory effort. . Abdomen: soft, ntnd . Skin: no rash or induration seen on limited exam . Musculoskeletal: grossly normal tone BUE/BLE . Psychiatric: grossly normal mood and affect, speech fluent and appropriate . Neurologic: grossly non-focal.          Labs on Admission:  Basic Metabolic Panel: Recent Labs  Lab 04/20/19 2045  NA 130*  K 5.4*  CL 100  CO2 19*  GLUCOSE 310*  BUN 71*  CREATININE 4.04*  CALCIUM 8.6*   Liver Function Tests: No results for input(s): AST, ALT, ALKPHOS, BILITOT, PROT, ALBUMIN in the last 168 hours. No results for input(s): LIPASE, AMYLASE in the last 168 hours. No results for input(s): AMMONIA in the last 168 hours. CBC: Recent Labs  Lab 04/20/19 2045  WBC 10.0  NEUTROABS 8.7*  HGB 8.1*  HCT 26.3*  MCV 96.7  PLT 225   Cardiac Enzymes: No results for input(s): CKTOTAL, CKMB, CKMBINDEX, TROPONINI in the last 168 hours.  BNP (last 3 results) Recent Labs    11/22/18 1724  BNP 54.2    ProBNP (last 3 results) No results for input(s): PROBNP in the last 8760 hours.  CBG: No results for input(s): GLUCAP in the last 168 hours.  Radiological Exams on Admission: US Renal  Result Date: 04/20/2019 CLINICAL DATA:  Hydronephrosis. EXAM: RENAL / URINARY TRACT ULTRASOUND COMPLETE COMPARISON:  None. FINDINGS: Right Kidney: Renal measurements: 16.2 x 5.1 x 6.6 cm = volume: 177 mL. There is no hydronephrosis. There is a 13 mm stone in the upper pole. Left Kidney: Renal measurements: 12.1 x 7.5 x 6.6 cm = volume: 380 mL. There is mild to moderate left-sided hydroureteronephrosis. There is an 8 mm  nonobstructing stone in the interpolar region. Bladder: The ureteral jets were not  visualized. Other: The spleen is significantly enlarged measuring approximately 782 mL in volume. IMPRESSION: 1. Mild-to-moderate left-sided hydronephrosis. 2. Bilateral nonobstructing nephroliths are noted. 3. The bilateral ureteral jets were not visualized however this may be secondary to underdistention. 4. Incidentally noted splenomegaly. Electronically Signed   By: Constance Holster M.D.   On: 04/20/2019 23:26    EKG: Independently reviewed. HR 90. Sinus rhythm. QTc 443. No STEMI.  Assessment/Plan Principal Problem:   Acute renal failure superimposed on stage 3b chronic kidney disease (HCC) Active Problems:   Type 2 diabetes with nephropathy (HCC)   Hyperlipidemia   Essential hypertension   CAD, NATIVE VESSEL   GERD   CHEST PAIN   Bladder cancer ([place])   Renal insufficiency   AKI (acute kidney injury) ([place])   Anemia associated with chemotherapy   Hyponatremia  69 y.o. male with PMH of CKD, anemia, CAD s/p PCI, HTN, HLD, bladder cancer currently on chemo, T2DM presented to ED with chest pain which resolved but admitted for AKI on CKD.  Acute on chronic renal failure -Cr 4.04 on admission and has been trending up with each lab check since Jan 2021; baseline then around 1.8; At baseline patient with chronic kidney disease a stage IIIb -Urine Na and Cr ordered  -Renal US as above with left-sided hydronephrosis -UA ordered  -1 L NS on admission (EF 55-60% in Mar 2021) -Continue to minimize/avoid nephrotoxic agents -Maintain adequate hydration and Hgb level for adequate perfusion -Repeat basic metabolic panel at follow-up visit to reassess renal function trend -Consult Renal as needed  Insulin-dependent diabetes with nephropathy Hyponatremia -A1c 7.4 in Jan 2021 -Modified carbohydrate diet  -Glucose 310 on admission -PTA regimen: Lantus 50 u qhs, Humalog 15 u BID, Jardiance 25 mg qd, Trulicity 1.5  mg q Sunday -Patient reports minimal appetite: Adjusted on admission to: Lantus 30 qhs, Humalog 10 u BID. Trulicity continued. Holding Invokana in setting of AKI although not a direct indication. -Sliding scale -Titrate above PRN -Na corrects to normal range in setting of hyperglycemia   Acute on chronic anemia  -likely in setting of chemotherapy vs anemia of chronic disease -Hgb 8.1 and stable on admission; no indication for transfusion at this time -Goal>8.0 in setting of CAD -Type and Screen done; transfuse PRN -Will repeat iron studies -No signs of active bleeding appreciated, okay to cont aspirin and Plavix  Chest Pain CAD -chest pain absent on admission -Initial Trop WNL -Will trend second Trop -Cont aspirin, Plavix, beta-blocker, adjusted dose of nitrates and as needed nitroglycerin -No ACE inhibitors/ARB in the setting of acute renal insufficiency on chronic kidney disease currently  Bladder cancer -Patient actively receiving chemotherapy  Gastroesophageal reflux disease -Cont Protonix   HLD -Continue Vascepa; allergic reactions to the use of statins Lab Results  Component Value Date   CHOL 223 (H) 11/23/2018   HDL 39 (L) 11/23/2018   LDLCALC 137 (H) 11/23/2018   TRIG 237 (H) 11/23/2018   CHOLHDL 5.7 11/23/2018    Code Status: DNR: Declines chest compressions and intubation. Okay with NIV, pressors, ICU-level care.  DVT Prophylaxis: Heparin Family Communication: None Disposition Plan: Admit to inpatient. Patient is at high risk for further decompensation due to age and co-morbidities.   Time spent: 70 minutes  Chauncey Mann, MD [facility] Pager [phone]

## 2019-04-21 LAB — COMPREHENSIVE METABOLIC PANEL
ALT: 59 U/L — ABNORMAL HIGH (ref 0–44)
AST: 40 U/L (ref 15–41)
Albumin: 2.5 g/dL — ABNORMAL LOW (ref 3.5–5.0)
Alkaline Phosphatase: 314 U/L — ABNORMAL HIGH (ref 38–126)
Anion gap: 9 (ref 5–15)
BUN: 69 mg/dL — ABNORMAL HIGH (ref 8–23)
CO2: 21 mmol/L — ABNORMAL LOW (ref 22–32)
Calcium: 8.4 mg/dL — ABNORMAL LOW (ref 8.9–10.3)
Chloride: 103 mmol/L (ref 98–111)
Creatinine, Ser: 4.09 mg/dL — ABNORMAL HIGH (ref 0.61–1.24)
GFR calc Af Amer: 16 mL/min — ABNORMAL LOW (ref >60)
GFR calc non Af Amer: 14 mL/min — ABNORMAL LOW (ref >60)
Glucose, Bld: 194 mg/dL — ABNORMAL HIGH (ref 70–99)
Potassium: 5.3 mmol/L — ABNORMAL HIGH (ref 3.5–5.1)
Sodium: 133 mmol/L — ABNORMAL LOW (ref 135–145)
Total Bilirubin: 0.8 mg/dL (ref 0.3–1.2)
Total Protein: 6 g/dL — ABNORMAL LOW (ref 6.5–8.1)

## 2019-04-21 LAB — URINALYSIS, ROUTINE W REFLEX MICROSCOPIC
Bilirubin Urine: NEGATIVE
Glucose, UA: NEGATIVE mg/dL
Ketones, ur: NEGATIVE mg/dL
Leukocytes,Ua: NEGATIVE
Nitrite: NEGATIVE
Protein, ur: 30 mg/dL — AB
Specific Gravity, Urine: 1.015 (ref 1.005–1.030)
pH: 5 (ref 5.0–8.0)

## 2019-04-21 LAB — RESPIRATORY PANEL BY RT PCR (FLU A&B, COVID)
Influenza A by PCR: NEGATIVE
Influenza B by PCR: NEGATIVE
SARS Coronavirus 2 by RT PCR: NEGATIVE

## 2019-04-21 LAB — MAGNESIUM: Magnesium: 2.2 mg/dL (ref 1.7–2.4)

## 2019-04-21 LAB — CBC
HCT: 24.5 % — ABNORMAL LOW (ref 39.0–52.0)
Hemoglobin: 7.6 g/dL — ABNORMAL LOW (ref 13.0–17.0)
MCH: 29.5 pg (ref 26.0–34.0)
MCHC: 31 g/dL (ref 30.0–36.0)
MCV: 95 fL (ref 80.0–100.0)
Platelets: 216 10*3/uL (ref 150–400)
RBC: 2.58 MIL/uL — ABNORMAL LOW (ref 4.22–5.81)
RDW: 14.6 % (ref 11.5–15.5)
WBC: 8.3 10*3/uL (ref 4.0–10.5)
nRBC: 0 % (ref 0.0–0.2)

## 2019-04-21 LAB — GLUCOSE, CAPILLARY
Glucose-Capillary: 239 mg/dL — ABNORMAL HIGH (ref 70–99)
Glucose-Capillary: 160 mg/dL — ABNORMAL HIGH (ref 70–99)
Glucose-Capillary: 160 mg/dL — ABNORMAL HIGH (ref 70–99)
Glucose-Capillary: 121 mg/dL — ABNORMAL HIGH (ref 70–99)
Glucose-Capillary: 123 mg/dL — ABNORMAL HIGH (ref 70–99)

## 2019-04-21 LAB — PHOSPHORUS: Phosphorus: 4.6 mg/dL (ref 2.5–4.6)

## 2019-04-21 LAB — CREATININE, URINE, RANDOM: Creatinine, Urine: 161.65 mg/dL

## 2019-04-21 LAB — IRON AND TIBC
Iron: 21 ug/dL — ABNORMAL LOW (ref 45–182)
Saturation Ratios: 12 % — ABNORMAL LOW (ref 17.9–39.5)
TIBC: 174 ug/dL — ABNORMAL LOW (ref 250–450)
UIBC: 153 ug/dL

## 2019-04-21 LAB — TRANSFERRIN: Transferrin: 124 mg/dL — ABNORMAL LOW (ref 180–329)

## 2019-04-21 LAB — ABO/RH: ABO/RH(D): O POS

## 2019-04-21 LAB — TROPONIN I (HIGH SENSITIVITY): Troponin I (High Sensitivity): 16 ng/L (ref <18)

## 2019-04-21 LAB — FERRITIN: Ferritin: 3072 ng/mL — ABNORMAL HIGH (ref 24–336)

## 2019-04-21 LAB — SODIUM, URINE, RANDOM: Sodium, Ur: 21 mmol/L

## 2019-04-21 LAB — POTASSIUM: Potassium: 5.1 mmol/L (ref 3.5–5.1)

## 2019-04-21 MED ORDER — ASPIRIN EC 81 MG PO TBEC
81.0000 mg | DELAYED_RELEASE_TABLET | Freq: Every day | ORAL | Status: DC
  Administered 2019-04-21 – 2019-04-25 (×5): 81 mg via ORAL
  Filled 2019-04-21 (×5): qty 1

## 2019-04-21 MED ORDER — DULAGLUTIDE 1.5 MG/0.5ML ~~LOC~~ SOAJ: 1.5000 mg | SUBCUTANEOUS | Status: DC

## 2019-04-21 MED ORDER — HYDROMORPHONE HCL 2 MG PO TABS
1.0000 mg | ORAL_TABLET | ORAL | Status: DC | PRN
  Administered 2019-04-21 – 2019-04-22 (×2): 1 mg via ORAL
  Filled 2019-04-21 (×2): qty 1

## 2019-04-21 MED ORDER — SODIUM CHLORIDE 0.9 % IV SOLN: INTRAVENOUS | Status: AC

## 2019-04-21 MED ORDER — SODIUM ZIRCONIUM CYCLOSILICATE 10 G PO PACK
10.0000 g | PACK | Freq: Once | ORAL | Status: AC
  Administered 2019-04-21: 14:00:00 10 g via ORAL
  Filled 2019-04-21: qty 1

## 2019-04-21 MED ORDER — HEPARIN SODIUM (PORCINE) 5000 UNIT/ML IJ SOLN
5000.0000 [IU] | Freq: Three times a day (TID) | INTRAMUSCULAR | Status: DC
  Administered 2019-04-21 – 2019-04-22 (×4): 5000 [IU] via SUBCUTANEOUS
  Filled 2019-04-21 (×4): qty 1

## 2019-04-21 MED ORDER — ICOSAPENT ETHYL 1 G PO CAPS
2.0000 g | ORAL_CAPSULE | Freq: Two times a day (BID) | ORAL | Status: DC
  Administered 2019-04-21 – 2019-04-22 (×4): 2 g via ORAL
  Filled 2019-04-21 (×6): qty 2

## 2019-04-21 MED ORDER — NITROGLYCERIN 0.4 MG SL SUBL
0.4000 mg | SUBLINGUAL_TABLET | SUBLINGUAL | Status: DC | PRN
  Administered 2019-04-22 – 2019-04-25 (×4): 0.4 mg via SUBLINGUAL
  Filled 2019-04-21 (×5): qty 1

## 2019-04-21 MED ORDER — AMLODIPINE BESYLATE 5 MG PO TABS
5.0000 mg | ORAL_TABLET | Freq: Every day | ORAL | Status: DC
  Administered 2019-04-21 – 2019-04-25 (×4): 5 mg via ORAL
  Filled 2019-04-21 (×4): qty 1

## 2019-04-21 MED ORDER — INSULIN GLARGINE 100 UNIT/ML ~~LOC~~ SOLN
30.0000 [IU] | Freq: Every day | SUBCUTANEOUS | Status: DC
  Administered 2019-04-21 (×2): 30 [IU] via SUBCUTANEOUS
  Filled 2019-04-21 (×4): qty 0.3

## 2019-04-21 MED ORDER — INSULIN ASPART 100 UNIT/ML ~~LOC~~ SOLN
10.0000 [IU] | Freq: Two times a day (BID) | SUBCUTANEOUS | Status: DC
  Administered 2019-04-21 – 2019-04-22 (×3): 10 [IU] via SUBCUTANEOUS

## 2019-04-21 MED ORDER — ISOSORBIDE MONONITRATE ER 60 MG PO TB24
120.0000 mg | ORAL_TABLET | Freq: Every day | ORAL | Status: DC
  Administered 2019-04-21 – 2019-04-25 (×4): 120 mg via ORAL
  Filled 2019-04-21 (×4): qty 2

## 2019-04-21 MED ORDER — ENSURE MAX PROTEIN PO LIQD
11.0000 [oz_av] | Freq: Every day | ORAL | Status: DC
  Administered 2019-04-21 – 2019-04-25 (×2): 11 [oz_av] via ORAL
  Filled 2019-04-21 (×5): qty 330

## 2019-04-21 MED ORDER — HYDROCODONE-ACETAMINOPHEN 7.5-325 MG PO TABS
1.0000 | ORAL_TABLET | Freq: Four times a day (QID) | ORAL | Status: DC | PRN
  Administered 2019-04-21 – 2019-04-24 (×6): 1 via ORAL
  Filled 2019-04-21 (×6): qty 1

## 2019-04-21 MED ORDER — METOPROLOL TARTRATE 50 MG PO TABS
50.0000 mg | ORAL_TABLET | Freq: Two times a day (BID) | ORAL | Status: DC
  Administered 2019-04-21 – 2019-04-25 (×10): 50 mg via ORAL
  Filled 2019-04-21 (×11): qty 1

## 2019-04-21 MED ORDER — CLOPIDOGREL BISULFATE 75 MG PO TABS
75.0000 mg | ORAL_TABLET | Freq: Every day | ORAL | Status: DC
  Administered 2019-04-21 – 2019-04-25 (×5): 75 mg via ORAL
  Filled 2019-04-21 (×5): qty 1

## 2019-04-21 MED ORDER — ACETAMINOPHEN 325 MG PO TABS
650.0000 mg | ORAL_TABLET | Freq: Four times a day (QID) | ORAL | Status: DC | PRN
  Administered 2019-04-21 – 2019-04-25 (×2): 650 mg via ORAL
  Filled 2019-04-21 (×2): qty 2

## 2019-04-21 MED ORDER — PANTOPRAZOLE SODIUM 20 MG PO TBEC
20.0000 mg | DELAYED_RELEASE_TABLET | Freq: Every day | ORAL | Status: DC
  Administered 2019-04-21 – 2019-04-25 (×5): 20 mg via ORAL
  Filled 2019-04-21 (×5): qty 1

## 2019-04-21 MED ORDER — SODIUM CHLORIDE 0.9 % IV SOLN
INTRAVENOUS | Status: DC
  Administered 2019-04-21: 1000 mL via INTRAVENOUS

## 2019-04-21 MED ORDER — INSULIN ASPART 100 UNIT/ML ~~LOC~~ SOLN
0.0000 [IU] | Freq: Three times a day (TID) | SUBCUTANEOUS | Status: DC
  Administered 2019-04-21: 1 [IU] via SUBCUTANEOUS
  Administered 2019-04-21: 14:00:00 2 [IU] via SUBCUTANEOUS
  Administered 2019-04-21: 8 [IU] via SUBCUTANEOUS
  Administered 2019-04-23: 1 [IU] via SUBCUTANEOUS
  Administered 2019-04-24: 3 [IU] via SUBCUTANEOUS
  Administered 2019-04-24: 2 [IU] via SUBCUTANEOUS
  Administered 2019-04-25: 0 [IU] via SUBCUTANEOUS
  Administered 2019-04-25: 1 [IU] via SUBCUTANEOUS

## 2019-04-21 MED ORDER — PROCHLORPERAZINE MALEATE 10 MG PO TABS
10.0000 mg | ORAL_TABLET | Freq: Four times a day (QID) | ORAL | Status: DC | PRN
  Administered 2019-04-22 – 2019-04-23 (×2): 10 mg via ORAL
  Filled 2019-04-21 (×2): qty 1

## 2019-04-21 NOTE — Progress Notes (Addendum)
PROGRESS NOTE  Chad Yu FOY:[id number] DOB: 1950-09-21 DOA: 04/20/2019 PCP: [name], MD  HPI/Recap of past 24 hours: Chad Yu is a 69 y.o. male with PMH of CKDIIIB, anemia of chronic disease, CAD s/p PCI, HTN, HLD, bladder cancer currently on chemo, T2DM presented to [facility] ED with chest pain which resolved but admitted for AKI on CKDIIIB.  Reports generalized weakness and poor oral intake for the past 1-2 weeks.  Significantly dehydrated on presentation with AKI and hyperkalemia.  TRH asked to admit.  04/21/19:  Seen and examined.  No chest pain at the time of this exam.  Minimal UO this AM.  Will start IV fluid and closely monitor UO, renal function and volume status.  Assessment/Plan: Principal Problem:   Acute renal failure superimposed on stage 3b chronic kidney disease (HCC) Active Problems:   Type 2 diabetes with nephropathy (HCC)   Hyperlipidemia   Essential hypertension   CAD, NATIVE VESSEL   GERD   CHEST PAIN   Bladder cancer ([place])   Renal insufficiency   AKI (acute kidney injury) ([place])   Anemia associated with chemotherapy   Hyponatremia  AKI on CKD IIIB suspect prerenal in the setting of dehydration from poor oral intake Presented with cr 4.04, slightly trending up this AM to 4.09 Baseline cr 1.88 with GFR 38 Start NS at 75 cc/hr, closely monitor UO, renal function and volume status. Obtain Renal US>> Mild to moderate left sided hydronephrosis Start strict I&O Avoid nephrotoxins, hypotension and dehydration Daily BMPs  Hyperkalemia, likely in the setting of AKI K+ 5.3 Ordered Lokelma Repeat serum K+ this afternoon  Insulin-dependent diabetes with hyperglycemia and nephropathy -A1c 7.4 in Jan 2021 -Modified carbohydrate diet  -Glucose 310 on admission -PTA regimen: Lantus 50 u qhs, Humalog 15 u BID, Jardiance 25 mg qd, Trulicity 1.5 mg q Sunday -Patient reports minimal appetite: Adjusted on admission to: Lantus 30 qhs, Humalog 10 u BID. Trulicity  continued. Holding Invokana in setting of AKI although not a direct indication. -Sliding scale -Titrate above PRN -Na corrects to normal range in setting of hyperglycemia   Mild to moderate L hydronephrosis Obtain bladder scan If urine retention insert foley If no urine retention will consult urology  Splenomegaly Incidental findings Surveillance plt count normal  B/L Nephrolithiasis without obstruction Continue IV fluid hydration  Pseudohyponatremia Management per above  Dehydration from poor oral intake Continue IV fluid Encourage oral intake IV anti emetics PRN  Essential HTN BP is at goal C/ current cardiac meds  Anemia of chronic disease in the setting of maligancy and CKD Hg 7.6 Baseline Hg 9 Transfuse Hg <7.0  Severe protein calorie malnutrition Albumin 2.5 Muscle mass loss Oral supplement Encourage increase protein calorie intake  Abnormal LFTs Elevated alk phos and ALT AST and Tbili normal Possibly related to chemo  Resolved Chest Pain CAD -chest pain absent on admission -Trop WNL -Cont aspirin, Plavix, beta-blocker, adjusted dose of nitrates and as needed nitroglycerin -No ACE inhibitors/ARB in the setting of acute renal insufficiency on chronic kidney disease currently  Bladder cancer -Ongoing chemotherapy  Chronic dCHF Last 2D echo 03/19/19 showed LVEF 55-60% GIDD Strict I&O and daily weight Monitor volume status while on IV fluid   Gastroesophageal reflux disease -Cont Protonix   HLD -Continue Vascepa; allergic reactions to the use of statins   Code Status: DNR: Declines chest compressions and intubation. Okay with NIV, pressors, ICU-level care.  DVT Prophylaxis: Heparin sq TID Family Communication: Will call family if ok with the  patient  Disposition Plan: Patient is from home.  Anticipate dc to home with HHS in 48-72 hours once AKI has improved.      Objective: Vitals:   04/21/19 0126 04/21/19 0537 04/21/19 0930 04/21/19  1001  BP: 123/80 (!) 104/47 129/75 129/75  Pulse: 71 73 76 76  Resp: 18 18 20    Temp: 98.4 F (36.9 C) 98.9 F (37.2 C) 98.1 F (36.7 C)   TempSrc: Oral Oral Oral   SpO2: 98% 95% 96%   Weight: 96.2 kg     Height: 6' (1.829 m)       Intake/Output Summary (Last 24 hours) at 04/21/2019 1136 Last data filed at 04/21/2019 1000 Gross per 24 hour  Intake 544.73 ml  Output 350 ml  Net 194.73 ml   Filed Weights   04/20/19 1958 04/21/19 0126  Weight: 103 kg 96.2 kg    Exam:  . General: 69 y.o. year-old male well developed well nourished in no acute distress.  Alert and oriented x3. . Cardiovascular: Regular rate and rhythm with no rubs or gallops.  No thyromegaly or JVD noted.   Marland Kitchen Respiratory: Clear to auscultation with no wheezes or rales. Good inspiratory effort. . Abdomen: Soft nontender nondistended with normal bowel sounds x4 quadrants. . Musculoskeletal: No lower extremity edema.  Marland Kitchen Psychiatry: Mood is appropriate for condition and setting   Data Reviewed: CBC: Recent Labs  Lab 04/20/19 2045 04/21/19 0520  WBC 10.0 8.3  NEUTROABS 8.7*  --   HGB 8.1* 7.6*  HCT 26.3* 24.5*  MCV 96.7 95.0  PLT 225 078   Basic Metabolic Panel: Recent Labs  Lab 04/20/19 2045 04/20/19 2335 04/21/19 0520 04/21/19 0619  NA 130*  --  133*  --   K 5.4*  --  5.3*  --   CL 100  --  103  --   CO2 19*  --  21*  --   GLUCOSE 310*  --  194*  --   BUN 71*  --  69*  --   CREATININE 4.04*  --  4.09*  --   CALCIUM 8.6*  --  8.4*  --   MG  --  2.2  --   --   PHOS  --   --   --  4.6   GFR: Estimated Creatinine Clearance: 20.5 mL/min (A) (by C-G formula based on SCr of 4.09 mg/dL (H)). Liver Function Tests: Recent Labs  Lab 04/21/19 0520  AST 40  ALT 59*  ALKPHOS 314*  BILITOT 0.8  PROT 6.0*  ALBUMIN 2.5*   No results for input(s): LIPASE, AMYLASE in the last 168 hours. No results for input(s): AMMONIA in the last 168 hours. Coagulation Profile: Recent Labs  Lab 04/20/19 2045    INR 1.4*   Cardiac Enzymes: No results for input(s): CKTOTAL, CKMB, CKMBINDEX, TROPONINI in the last 168 hours. BNP (last 3 results) No results for input(s): PROBNP in the last 8760 hours. HbA1C: No results for input(s): HGBA1C in the last 72 hours. CBG: Recent Labs  Lab 04/21/19 0128 04/21/19 0755  GLUCAP 239* 160*   Lipid Profile: No results for input(s): CHOL, HDL, LDLCALC, TRIG, CHOLHDL, LDLDIRECT in the last 72 hours. Thyroid Function Tests: No results for input(s): TSH, T4TOTAL, FREET4, T3FREE, THYROIDAB in the last 72 hours. Anemia Panel: Recent Labs    04/21/19 0520  FERRITIN 3,072*  TIBC 174*  IRON 21*   Urine analysis:    Component Value Date/Time   COLORURINE YELLOW 04/20/2019 2345  APPEARANCEUR CLEAR 04/20/2019 2345   LABSPEC 1.015 04/20/2019 2345   PHURINE 5.0 04/20/2019 2345   GLUCOSEU NEGATIVE 04/20/2019 2345   HGBUR MODERATE (A) 04/20/2019 2345   BILIRUBINUR NEGATIVE 04/20/2019 [place] 04/20/2019 2345   PROTEINUR 30 (A) 04/20/2019 2345   UROBILINOGEN 0.2 11/26/2008 0947   NITRITE NEGATIVE 04/20/2019 2345   LEUKOCYTESUR NEGATIVE 04/20/2019 2345   Sepsis Labs: @LABRCNTIP (procalcitonin:4,lacticidven:4)  ) Recent Results (from the past 240 hour(s))  Respiratory Panel by RT PCR (Flu A&B, Covid) - Nasopharyngeal Swab     Status: None   Collection Time: 04/20/19 11:50 PM   Specimen: Nasopharyngeal Swab  Result Value Ref Range Status   SARS Coronavirus 2 by RT PCR NEGATIVE NEGATIVE Final    Comment: (NOTE) SARS-CoV-2 target nucleic acids are NOT DETECTED. The SARS-CoV-2 RNA is generally detectable in upper respiratoy specimens during the acute phase of infection. The lowest concentration of SARS-CoV-2 viral copies this assay can detect is 131 copies/mL. A negative result does not preclude SARS-Cov-2 infection and should not be used as the sole basis for treatment or other patient management decisions. A negative result may occur  with  improper specimen collection/handling, submission of specimen other than nasopharyngeal swab, presence of viral mutation(s) within the areas targeted by this assay, and inadequate number of viral copies (<131 copies/mL). A negative result must be combined with clinical observations, patient history, and epidemiological information. The expected result is Negative. Fact Sheet for Patients:  PinkCheek.be Fact Sheet for Healthcare Providers:  GravelBags.it This test is not yet ap proved or cleared by the [place] FDA and  has been authorized for detection and/or diagnosis of SARS-CoV-2 by FDA under an Emergency Use Authorization (EUA). This EUA will remain  in effect (meaning this test can be used) for the duration of the COVID-19 declaration under Section 564(b)(1) of the Act, 21 U.S.C. section 360bbb-3(b)(1), unless the authorization is terminated or revoked sooner.    Influenza A by PCR NEGATIVE NEGATIVE Final   Influenza B by PCR NEGATIVE NEGATIVE Final    Comment: (NOTE) The Xpert Xpress SARS-CoV-2/FLU/RSV assay is intended as an aid in  the diagnosis of influenza from Nasopharyngeal swab specimens and  should not be used as a sole basis for treatment. Nasal washings and  aspirates are unacceptable for Xpert Xpress SARS-CoV-2/FLU/RSV  testing. Fact Sheet for Patients: PinkCheek.be Fact Sheet for Healthcare Providers: GravelBags.it This test is not yet approved or cleared by the [place] FDA and  has been authorized for detection and/or diagnosis of SARS-CoV-2 by  FDA under an Emergency Use Authorization (EUA). This EUA will remain  in effect (meaning this test can be used) for the duration of the  Covid-19 declaration under Section 564(b)(1) of the Act, 21  U.S.C. section 360bbb-3(b)(1), unless the authorization is  terminated or revoked. Performed  at [facility], [place] [name] [place], [place] [address]       Studies: US Renal  Result Date: 04/20/2019 CLINICAL DATA:  Hydronephrosis. EXAM: RENAL / URINARY TRACT ULTRASOUND COMPLETE COMPARISON:  None. FINDINGS: Right Kidney: Renal measurements: 16.2 x 5.1 x 6.6 cm = volume: 177 mL. There is no hydronephrosis. There is a 13 mm stone in the upper pole. Left Kidney: Renal measurements: 12.1 x 7.5 x 6.6 cm = volume: 380 mL. There is mild to moderate left-sided hydroureteronephrosis. There is an 8 mm nonobstructing stone in the interpolar region. Bladder: The ureteral jets were not visualized. Other: The spleen is significantly  enlarged measuring approximately 782 mL in volume. IMPRESSION: 1. Mild-to-moderate left-sided hydronephrosis. 2. Bilateral nonobstructing nephroliths are noted. 3. The bilateral ureteral jets were not visualized however this may be secondary to underdistention. 4. Incidentally noted splenomegaly. Electronically Signed   By: Constance Holster M.D.   On: 04/20/2019 23:26    Scheduled Meds: . amLODipine  5 mg Oral Daily  . aspirin EC  81 mg Oral Daily  . clopidogrel  75 mg Oral Daily  . Dulaglutide  1.5 mg Subcutaneous Q Sun  . heparin  5,000 Units Subcutaneous Q8H  . icosapent Ethyl  2 g Oral BID  . insulin aspart  0-9 Units Subcutaneous TID WC  . insulin aspart  10 Units Subcutaneous BID WC  . insulin glargine  30 Units Subcutaneous QHS  . isosorbide mononitrate  120 mg Oral Daily  . metoprolol tartrate  50 mg Oral BID  . pantoprazole  20 mg Oral Daily  . sodium zirconium cyclosilicate  10 g Oral Once    Continuous Infusions:   LOS: 1 day     Kayleen Memos, MD [facility] Pager [phone]  If 7PM-7AM, please contact night-coverage [email or web address] Password [facility] 04/21/2019, 11:36 AM

## 2019-04-21 NOTE — Progress Notes (Signed)
Physical Therapy Evaluation Patient Details Name: Chad Yu MRN: [id number] DOB: 10/07/1950 Today's Date: 04/21/2019   History of Present Illness  69 year old male comes in a chief complaint of chest pain and weakness. 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  Clinical Impression  Pt admitted as above and presenting with functional mobility limitations 2* generalized weakness, ltd endurance and ambulatory balance deficits.  Pt should progress to dc home with assist of family.  This session, pt ambulated in hall 120' with RW but c/o burning chest pain with return to bed.  Pt states "this happens at home and I take some nitro".  RN alerted and in room assessing pt.    Follow Up Recommendations No PT follow up    Equipment Recommendations  None recommended by PT    Recommendations for Other Services       Precautions / Restrictions Precautions Precautions: Fall Restrictions Weight Bearing Restrictions: No      Mobility  Bed Mobility Overal bed mobility: Needs Assistance Bed Mobility: Supine to Sit;Sit to Supine     Supine to sit: Min guard;HOB elevated Sit to supine: Min guard   General bed mobility comments: no physical assist needed  Transfers Overall transfer level: Needs assistance Equipment used: Rolling walker (2 wheeled) Transfers: Sit to/from Stand Sit to Stand: Min assist;From elevated surface         General transfer comment: A for balance and safety  Ambulation/Gait Ambulation/Gait assistance: Min assist;Min guard Gait Distance (Feet): 120 Feet Assistive device: Rolling walker (2 wheeled) Gait  Pattern/deviations: Step-through pattern;Decreased step length - right;Decreased step length - left;Shuffle;Trunk flexed Gait velocity: decr   General Gait Details: cues for posture and position from [employer]            Wheelchair Mobility    Modified Rankin (Stroke Patients Only)       Balance Overall balance assessment: Needs assistance Sitting-balance support: Feet supported;No upper extremity supported Sitting balance-Leahy Scale: Fair     Standing balance support: Bilateral upper extremity supported Standing balance-Leahy Scale: Poor                               Pertinent Vitals/Pain Pain Assessment: 0-10 Pain Score: 5  Pain Location: abdomen/chest Pain Descriptors / Indicators: Discomfort;Guarding;Grimacing;Burning(burning chest pain following ambulation) Pain Intervention(s): Limited activity within patient's tolerance;Monitored during session;Premedicated before session    Home Living Family/patient expects to be discharged to:: Private residence Living Arrangements: Spouse/significant other Available Help at Discharge: Family;Available 24 hours/day Type of Home: House Home Access: Stairs to enter Entrance Stairs-Rails: Right;Left;Can reach both Entrance Stairs-Number of Steps: 2 Home Layout: One level Home Equipment: Tub bench;Hand held shower head;Walker - 2 wheels;Walker - 4 wheels;Bedside commode Additional Comments: he lives separately from his wife (has for past 9 years) but will dc to her home for extra assistance    Prior Function Level of Independence: Needs assistance   Gait / Transfers Assistance Needed: assist for tub transfers; RW to assist with gait  ADL's / Homemaking Assistance Needed: assist into shower but does own bathing  Comments: retired, assisted by his step-son      Hand Dominance   Dominant Hand: Right  Extremity/Trunk Assessment   Upper Extremity Assessment Upper Extremity Assessment: Generalized  weakness    Lower Extremity Assessment Lower Extremity Assessment: Generalized weakness    Cervical / Trunk Assessment Cervical / Trunk Assessment: Normal  Communication   Communication: No difficulties  Cognition Arousal/Alertness: Awake/alert Behavior During Therapy: Flat affect Overall Cognitive Status: Within Functional Limits for tasks assessed                                 General Comments: initially the patient was falling asleep during questions, even sitting at the edge of the bed. Able to become more aroused and answer all questions properly/appropriately      General Comments General comments (skin integrity, edema, etc.): upon completing ADL and hallway mobility- Pt experienced burning in chest - RN notified immediately and in room to check vitals and investigate more    Exercises     Assessment/Plan    PT Assessment Patient needs continued PT services  PT Problem List Decreased strength;Decreased activity tolerance;Decreased balance;Decreased mobility;Decreased knowledge of use of DME;Pain       PT Treatment Interventions DME instruction;Gait training;Stair training;Functional mobility training;Therapeutic activities;Therapeutic exercise;Patient/family education;Balance training    PT Goals (Current goals can be found in the Care Plan section)  Acute Rehab PT Goals Patient Stated Goal: decrease pain, and get back home PT Goal Formulation: With patient Time For Goal Achievement: 05/05/19 Potential to Achieve Goals: Fair    Frequency Min 3X/week   Barriers to discharge        Co-evaluation PT/OT/SLP Co-Evaluation/Treatment: Yes Reason for Co-Treatment: For patient/therapist safety PT goals addressed during session: Mobility/safety with mobility OT goals addressed during session: ADL's and self-care       AM-PAC PT "6 Clicks" Mobility  Outcome Measure Help needed turning from your back to your side while in a flat bed without using  bedrails?: None Help needed moving from lying on your back to sitting on the side of a flat bed without using bedrails?: None Help needed moving to and from a bed to a chair (including a wheelchair)?: A Little Help needed standing up from a chair using your arms (e.g., wheelchair or bedside chair)?: A Little Help needed to walk in hospital room?: A Little Help needed climbing 3-5 steps with a railing? : A Little 6 Click Score: 20    End of Session Equipment Utilized During Treatment: Gait belt Activity Tolerance: Patient tolerated treatment well Patient left: in bed Nurse Communication: Mobility status PT Visit Diagnosis: Difficulty in walking, not elsewhere classified (R26.2);Muscle weakness (generalized) (M62.81)    Time: [id number] PT Time Calculation (min) (ACUTE ONLY): 29 min   Charges:   PT Evaluation $PT Eval Low Complexity: 1 Low          [place] Pager [phone] Office [phone]   Jasman Murri 04/21/2019, 12:40 PM

## 2019-04-21 NOTE — Evaluation (Signed)
Occupational Therapy Evaluation Patient Details Name: Chad Yu MRN: [id number] DOB: Jan 14, 1951 Today's Date: 04/21/2019    History of Present Illness 69 year old male comes in a chief complaint of chest pain and weakness. 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   Clinical Impression   PTA Pt gets assist from his step son for shower transfer with bathing (supervision level bathing). Mod I for dressing, wife completes IADL. Today Pt is min A for LB ADL, min guard assist for transfers and mobility with RW and fatigues quickly with chest pain (burning) post-hallway mobility (RN in room to address). Pt will benefit from continued skilled OT in the acute setting as well as after wards with [place] to maximize safety and independence in ADL and functional transfers.     Follow Up Recommendations  Home health OT;Supervision/Assistance - 24 hour(initially)    Equipment Recommendations  None recommended by OT(Pt has appropriate DME)    Recommendations for Other Services       Precautions / Restrictions Precautions Precautions: Fall Restrictions Weight Bearing Restrictions: No      Mobility Bed Mobility Overal bed mobility: Needs Assistance Bed Mobility: Supine to Sit     Supine to sit: Min guard;HOB elevated     General bed mobility comments: no physical assist needed  Transfers Overall transfer level: Needs assistance   Transfers: Sit to/from Stand Sit to Stand: Min assist;From elevated surface         General transfer comment: A for balance and safety    Balance Overall balance assessment: Needs assistance Sitting-balance  support: Feet supported;No upper extremity supported Sitting balance-Leahy Scale: Fair     Standing balance support: Bilateral upper extremity supported Standing balance-Leahy Scale: Poor                             ADL either performed or assessed with clinical judgement   ADL Overall ADL's : Needs assistance/impaired Eating/Feeding: Set up;Sitting   Grooming: Wash/dry face;Brushing hair;Set up;Sitting Grooming Details (indicate cue type and reason): EOB, requires BUE support in standing currently Upper Body Bathing: Minimal assistance   Lower Body Bathing: Minimal assistance   Upper Body Dressing : Set up;Sitting   Lower Body Dressing: Min guard;Sitting/lateral leans;Moderate assistance;Sit to/from stand Lower Body Dressing Details (indicate cue type and reason): mod A for pants management Toilet Transfer: Min guard;Minimal assistance;RW   Toileting- Clothing Manipulation and Hygiene: Min guard;Sitting/lateral lean   Tub/ Shower Transfer: Tub transfer;Minimal assistance;With caregiver independent assisting;Tub bench;Rolling walker   Functional mobility during ADLs: Min guard;Minimal assistance;Cueing for safety;Rolling walker General ADL Comments: upon completing ADL and hallway mobility- Pt experienced burning in chest - RN notified immediately     Vision Baseline Vision/History: Wears glasses Patient Visual Report: No change from baseline Vision Assessment?: No apparent visual deficits     Perception     Praxis      Pertinent Vitals/Pain Pain Assessment: 0-10 Pain Score: 5  Pain Location: abdomen Pain Descriptors / Indicators: Discomfort;Guarding;Grimacing Pain Intervention(s): Limited activity within patient's tolerance;Monitored during session;Repositioned;RN gave pain meds during session     Hand Dominance Right   Extremity/Trunk Assessment Upper Extremity Assessment Upper Extremity Assessment: Generalized weakness   Lower Extremity  Assessment Lower  Extremity Assessment: Defer to PT evaluation   Cervical / Trunk Assessment Cervical / Trunk Assessment: Normal   Communication Communication Communication: No difficulties   Cognition Arousal/Alertness: Awake/alert Behavior During Therapy: Flat affect Overall Cognitive Status: Within Functional Limits for tasks assessed                                 General Comments: initially the patient was falling asleep during questions, even sitting at the edge of the bed. Able to become more aroused and answer all questions properly/appropriately   General Comments  upon completing ADL and hallway mobility- Pt experienced burning in chest - RN notified immediately and in room to check vitals and investigate more    Exercises     Shoulder Instructions      Home Living Family/patient expects to be discharged to:: Private residence Living Arrangements: Spouse/significant other Available Help at Discharge: Family;Available 24 hours/day Type of Home: House Home Access: Stairs to enter [employer] of Steps: 2 Entrance Stairs-Rails: Right;Left;Can reach both Home Layout: One level     Bathroom Shower/Tub: Teacher, early years/pre: Standard Bathroom Accessibility: Yes How Accessible: Accessible via walker Home Equipment: Tub bench;Hand held shower head;Walker - 2 wheels;Walker - 4 wheels;Bedside commode   Additional Comments: he lives separately from his wife (has for past 9 years) but will dc to her home for extra assistance      Prior Functioning/Environment Level of Independence: Needs assistance  Gait / Transfers Assistance Needed: assist for tub transfers ADL's / Homemaking Assistance Needed: assist for bathing, does own bathing   Comments: retired, assisted by his step-son         OT Problem List: Decreased activity tolerance;Impaired balance (sitting and/or standing);Decreased safety awareness;Decreased knowledge of use of  DME or AE;Pain      OT Treatment/Interventions: Self-care/ADL training;Energy conservation;DME and/or AE instruction;Therapeutic activities;Patient/family education;Balance training    OT Goals(Current goals can be found in the care plan section) Acute Rehab OT Goals Patient Stated Goal: decrease pain, and get back home OT Goal Formulation: With patient Time For Goal Achievement: 05/05/19 Potential to Achieve Goals: Good ADL Goals Pt Will Perform Grooming: with modified independence;standing Pt Will Perform Upper Body Dressing: with modified independence;sitting Pt Will Perform Lower Body Dressing: sit to/from stand;with supervision Pt Will Transfer to Toilet: with modified independence;ambulating Pt Will Perform Toileting - Clothing Manipulation and hygiene: with modified independence;sitting/lateral leans  OT Frequency: Min 2X/week   Barriers to D/C:    need to ensure that Pt plans to go to wife's house and not home alone       Co-evaluation PT/OT/SLP Co-Evaluation/Treatment: Yes Reason for Co-Treatment: For patient/therapist safety;To address functional/ADL transfers PT goals addressed during session: Mobility/safety with mobility;Balance;Proper use of DME OT goals addressed during session: ADL's and self-care;Proper use of Adaptive equipment and DME      AM-PAC OT "6 Clicks" Daily Activity     Outcome Measure Help from another person eating meals?: A Little Help from another person taking care of personal grooming?: A Little Help from another person toileting, which includes using toliet, bedpan, or urinal?: A Little Help from another person bathing (including washing, rinsing, drying)?: A Lot Help from another person to put on and taking off regular upper body clothing?: None Help from another person to put on and taking off regular lower body clothing?: A Lot 6 Click Score: 17   End of Session Equipment  Utilized During Treatment: Gait belt;Rolling walker Nurse  Communication: Mobility status;Other (comment)(burning chest pain)  Activity Tolerance: Patient tolerated treatment well Patient left: in bed;with call bell/phone within reach;with nursing/sitter in room  OT Visit Diagnosis: Unsteadiness on feet (R26.81);Other abnormalities of gait and mobility (R26.89);Muscle weakness (generalized) (M62.81);Pain Pain - Right/Left: Right Pain - part of body: (chest, and abdomen)                Time: [id number] OT Time Calculation (min): 30 min Charges:  OT General Charges $OT Visit: 1 Visit OT Evaluation $OT Eval Moderate Complexity: Henry OTR/L Acute Rehabilitation Services Pager: [phone] Office: [place] 04/21/2019, 11:01 AM

## 2019-04-21 NOTE — ED Provider Notes (Signed)
[facility] DEPT Provider Note   CSN: [id number] Arrival date & time: 04/20/19  1938     History Chief Complaint  Patient presents with  . Chest Pain    RT sided    Chad Yu is a 69 y.o. male.  HPI     69 year old male comes in a chief complaint of chest pain and weakness.  Patient has history of bladder cancer and has required blood transfusion for symptomatic anemia.  He reports that he has been feeling just been drained and weak.  He thinks that he might need some blood as he was feeling similarly when he has required transfusion before.  Patient has history of CAD and reports some right-sided chest pain.  The pain was not exertional.  He is currently pain-free.  Past Medical History:  Diagnosis Date  . Bladder tumor   . Cancer (HCC)    BLADDER CANCER  . Coronary artery disease cardiologist-- dr [name] (per pt last office visit 10/ 2018)   s/p  PCI w/ DES x1 to diagonal branch of LAD  . DDD (degenerative disc disease), lumbar   . Degenerative joint disease   . History of concussion    per pt age 52 w/ brief loc--- no residual  . Hyperlipidemia   . Hypertension   . OA (osteoarthritis)   . S/P drug eluting coronary stent placement 10/24/2007   diagonal branch of LAD  . Type 2 diabetes mellitus treated with insulin ([place])    followed by pcp  . Varicose vein of leg   . Wears glasses     Patient Active Problem List   Diagnosis Date Noted  . AKI (acute kidney injury) ([place]) 04/20/2019  . Anemia associated with chemotherapy 04/20/2019  . Hyponatremia 04/20/2019  . Acute systolic HF (heart failure) ([place])   . Acute renal failure superimposed on stage 3b chronic kidney disease ([place])   . Symptomatic anemia 02/04/2019  . Thrombocytopenia ([place]) 02/04/2019  . Renal insufficiency 02/04/2019  . NSTEMI (non-ST elevated myocardial infarction) ([place]) 02/03/2019  . Statin myopathy 01/10/2019  . Unstable angina ([place]) 11/23/2018  . Chest pain  11/22/2018  . Goals of care, counseling/discussion 09/19/2018  . Bladder cancer ([place]) 09/19/2018  . Primary localized osteoarthritis of left knee 02/17/2016  . Bradycardia 12/16/2010  . DIZZINESS 04/27/2009  . CHEST PAIN 04/27/2009  . ABNORMAL ELECTROCARDIOGRAM 04/27/2009  . ABDOMINAL PAIN-RUQ 10/30/2008  . GASTRITIS 06/13/2008  . GERD 06/05/2008  . SWELLING MASS OR LUMP IN HEAD AND NECK 06/05/2008  . COUGH 06/05/2008  . HEMORRHOIDS 06/04/2008  . CAD, NATIVE VESSEL 04/09/2008  . Type 2 diabetes with nephropathy ([place]) 04/07/2008  . Hyperlipidemia 04/07/2008  . Essential hypertension 04/07/2008  . FATIGUE / MALAISE 04/07/2008  . DYSPNEA 04/07/2008    Past Surgical History:  Procedure Laterality Date  . APPENDECTOMY  age 6  . COLONOSCOPY  2012  . CORONARY ANGIOPLASTY  08-26-2010   dr [name]   previous stent patent,  proximal prior to previous stent 99% stenosis;  Unsuccessful multiple attempts PCI, unability to cross with a ballon;  medical management  . CORONARY ANGIOPLASTY WITH STENT PLACEMENT  10-23-2017  dr Darnell Level brodie   positive ischemia on myoview;   40% pLAD and D1 80%,  diagonal branch treated w/ PCI and DES   . EXCISION MIDLINE SUPERIOR THYOID ISTHMUS MASS  11-27-2008   dr Ernesto Rutherford   bening lipoma  . KNEE ARTHROSCOPY Left 01-22-2004   dr Percell Miller  . POSTERIOR LUMBAR FUSION  11-14-2012   dr Christella Noa @[facility]    L3 -- 5  . SHOULDER ARTHROSCOPY Right 2019  . SHOULDER ARTHROSCOPY WITH ROTATOR CUFF REPAIR AND SUBACROMIAL DECOMPRESSION Left 11-12-2009   dr Percell Miller  @[facility]    w/ Labral debridement and DCR  . TONSILLECTOMY  age 8  . TOTAL KNEE ARTHROPLASTY Left 02/17/2016   Procedure: TOTAL KNEE ARTHROPLASTY;  Surgeon: Ninetta Lights, MD;  Location: [facility];  Service: Orthopedics;  Laterality: Left;  . TOTAL KNEE ARTHROPLASTY Right 02-25-2004   dr Percell Miller  @[facility]   . TRANSURETHRAL RESECTION OF BLADDER TUMOR N/A 08/31/2018   Procedure: TRANSURETHRAL RESECTION OF BLADDER TUMOR (TURBT) WITH  INSTILLATION OF POST OPERATIVE CHEMOTHERAPY;  Surgeon: Kathie Rhodes, MD;  Location: [facility];  Service: Urology;  Laterality: N/A;  . VARICOSE VEIN SURGERY  1995       Family History  Problem Relation Age of Onset  . Heart attack Father   . Hypertension Father   . Heart attack Brother     Social History   Tobacco Use  . Smoking status: Never Smoker  . Smokeless tobacco: Never Used  Substance Use Topics  . Alcohol use: No  . Drug use: No    Home Medications Prior to Admission medications   Medication Sig Start Date End Date Taking? Authorizing Provider  amLODipine (NORVASC) 5 MG tablet Take 1 tablet (5 mg total) by mouth daily. 02/19/19  Yes Imogene Burn, PA-C  aspirin EC 81 MG EC tablet Take 1 tablet (81 mg total) by mouth daily. 11/26/18  Yes Geradine Girt, DO  clopidogrel (PLAVIX) 75 MG tablet Take 1 tablet (75 mg total) by mouth daily. 02/05/19  Yes Barton Dubois, MD  diclofenac sodium (VOLTAREN) 1 % GEL Apply 1 application topically at bedtime as needed (pain.).   Yes [provider]  diphenoxylate-atropine (LOMOTIL) 2.5-0.025 MG tablet 1 to 2 tablets PO QID prn diarrhea or stomach cramping 04/03/19  Yes Tanner, Lyndon Code., PA-C  Dulaglutide (TRULICITY) 1.5 0000000 SOPN Inject 1.5 mg into the skin every Sunday.    Yes [provider]  empagliflozin (JARDIANCE) 25 MG TABS tablet Take 25 mg by mouth daily.   Yes [provider]  Evolocumab (REPATHA SURECLICK) [id number] MG/ML SOAJ Inject 1 pen into the skin every 14 (fourteen) days. 11/26/18  Yes Burnell Blanks, MD  Homeopathic Products [facility] GLOVE + FOAM EX) Apply 1 application topically 2 (two) times daily as needed (leg cramps).   Yes [provider]  icosapent Ethyl (VASCEPA) 1 g capsule Take 2 capsules (2 g total) by mouth 2 (two) times daily. 12/10/18  Yes Sherren Mocha, MD  insulin glargine (LANTUS) 100 UNIT/ML injection Inject 50 Units into the skin at bedtime.    Yes [provider]  insulin lispro (HUMALOG) 100 UNIT/ML KwikPen Inject 15 Units into the skin 2 (two) times daily. 09/19/18  Yes [provider]  isosorbide mononitrate (IMDUR) 120 MG 24 hr tablet Take 1 tablet (120 mg total) by mouth daily. 02/06/19  Yes Barton Dubois, MD  metoprolol tartrate (LOPRESSOR) 50 MG tablet Take 1 tablet (50 mg total) by mouth 2 (two) times daily. 01/02/19 04/20/19 Yes Burtis Junes, NP  Multiple Vitamins-Minerals (MULTIVITAMIN WITH MINERALS) tablet Take 1 tablet by mouth daily.   Yes [provider]  nitroGLYCERIN (NITROSTAT) 0.4 MG SL tablet Place 1 tablet (0.4 mg total) under the tongue every 5 (five) minutes as needed for chest pain. 12/10/18 04/20/19 Yes Burtis Junes, NP  ondansetron (  ZOFRAN ODT) 4 MG disintegrating tablet Take 1 tablet (4 mg total) by mouth every 8 (eight) hours as needed for nausea or vomiting. 04/09/19  Yes Tanner, Lyndon Code., PA-C  pantoprazole (PROTONIX) 20 MG tablet Take 1 tablet (20 mg total) by mouth daily. 04/09/19 04/08/20 Yes Tanner, Lyndon Code., PA-C  Potassium Citrate 15 MEQ (1620 MG) TBCR Take 1 tablet by mouth 2 (two) times daily. 03/11/19  Yes [provider]  tetrahydrozoline 0.05 % ophthalmic solution Place 1 drop into both eyes at bedtime as needed (eye irritation).   Yes [provider]  fenofibrate (TRICOR) 145 MG tablet Take 145 mg by mouth daily.    08/02/18  [provider]    Allergies    Lisinopril, Statins, Atorvastatin, and Rosuvastatin  Review of Systems   Review of Systems  Constitutional: Positive for activity change and fatigue.  Respiratory: Negative for shortness of breath.   Cardiovascular: Positive for chest pain.  Neurological: Negative for dizziness and headaches.  Hematological: Does not bruise/bleed easily.  All other systems reviewed and are negative.   Physical Exam Updated Vital Signs BP 140/74   Pulse 73   Temp 99 F (37.2 C) (Oral)   Resp 19   Ht 6' (1.829 m)    Wt 103 kg   SpO2 100%   BMI 30.80 kg/m   Physical Exam Vitals and nursing note reviewed.  Constitutional:      Appearance: He is well-developed.  HENT:     Head: Atraumatic.  Cardiovascular:     Rate and Rhythm: Normal rate.     Heart sounds: Normal heart sounds.  Pulmonary:     Effort: Pulmonary effort is normal.  Musculoskeletal:     Cervical back: Neck supple.     Right lower leg: No edema.     Left lower leg: No edema.  Skin:    General: Skin is warm.  Neurological:     Mental Status: He is alert and oriented to person, place, and time.     ED Results / Procedures / Treatments   Labs (all labs ordered are listed, but only abnormal results are displayed) Labs Reviewed  BASIC METABOLIC PANEL - Abnormal; Notable for the following components:      Result Value   Sodium 130 (*)    Potassium 5.4 (*)    CO2 19 (*)    Glucose, Bld 310 (*)    BUN 71 (*)    Creatinine, Ser 4.04 (*)    Calcium 8.6 (*)    GFR calc non Af Amer 14 (*)    GFR calc Af Amer 16 (*)    All other components within normal limits  PROTIME-INR - Abnormal; Notable for the following components:   Prothrombin Time 16.9 (*)    INR 1.4 (*)    All other components within normal limits  CBC WITH DIFFERENTIAL/PLATELET - Abnormal; Notable for the following components:   RBC 2.72 (*)    Hemoglobin 8.1 (*)    HCT 26.3 (*)    Neutro Abs 8.7 (*)    Lymphs Abs 0.4 (*)    Abs Immature Granulocytes 0.17 (*)    All other components within normal limits  URINALYSIS, ROUTINE W REFLEX MICROSCOPIC - Abnormal; Notable for the following components:   Hgb urine dipstick MODERATE (*)    Protein, ur 30 (*)    Bacteria, UA RARE (*)    All other components within normal limits  TROPONIN I (HIGH SENSITIVITY) - Abnormal; Notable for  the following components:   Troponin I (High Sensitivity) 21 (*)    All other components within normal limits  RESPIRATORY PANEL BY RT PCR (FLU A&B, COVID)  SODIUM, URINE, RANDOM    CREATININE, URINE, RANDOM  MAGNESIUM  IRON AND TIBC  FERRITIN  TRANSFERRIN  TYPE AND SCREEN  ABO/RH  TROPONIN I (HIGH SENSITIVITY)    EKG EKG Interpretation  Date/Time:  Saturday April 20 2019 19:49:09 EDT Ventricular Rate:  94 PR Interval:    QRS Duration: 97 QT Interval:  354 QTC Calculation: 443 R Axis:   12 Text Interpretation: Sinus rhythm Anteroseptal infarct, age indeterminate No acute changes No significant change since last tracing Confirmed by Varney Biles [phone]) on 04/20/2019 9:01:10 PM   Radiology US Renal  Result Date: 04/20/2019 CLINICAL DATA:  Hydronephrosis. EXAM: RENAL / URINARY TRACT ULTRASOUND COMPLETE COMPARISON:  None. FINDINGS: Right Kidney: Renal measurements: 16.2 x 5.1 x 6.6 cm = volume: 177 mL. There is no hydronephrosis. There is a 13 mm stone in the upper pole. Left Kidney: Renal measurements: 12.1 x 7.5 x 6.6 cm = volume: 380 mL. There is mild to moderate left-sided hydroureteronephrosis. There is an 8 mm nonobstructing stone in the interpolar region. Bladder: The ureteral jets were not visualized. Other: The spleen is significantly enlarged measuring approximately 782 mL in volume. IMPRESSION: 1. Mild-to-moderate left-sided hydronephrosis. 2. Bilateral nonobstructing nephroliths are noted. 3. The bilateral ureteral jets were not visualized however this may be secondary to underdistention. 4. Incidentally noted splenomegaly. Electronically Signed   By: Constance Holster M.D.   On: 04/20/2019 23:26    Procedures Procedures (including critical care time)  Medications Ordered in ED Medications - No data to display  ED Course  I have reviewed the triage vital signs and the nursing notes.  Pertinent labs & imaging results that were available during my care of the patient were reviewed by me and considered in my medical decision making (see chart for details).    MDM Rules/Calculators/A&P                      69 year old patient with CAD and  bladder cancer history comes in a chief complaint of weakness and chest pain.  Chest pain could be demand ischemia.  EKG is not showing any acute findings.  He is currently chest pain-free.  It does not appear clinically that he has underlying PE as the pain is not pleuritic and has not resolved and we do not think he has pneumonia either.  Patient is also complaining of generalized weakness and thinks that he might need transfusion.  He has required blood transfusion in the past and his lab work-up here shows hemoglobin of 8.1.  Of note, he is noted to have acute on chronic renal failure.  With his GU cancer history we will give bladder scan to check for his postvoid residual and also get ultrasound of kidneys.  I suspect right now that his weakness is secondary to uremia and renal failure.  Final Clinical Impression(s) / ED Diagnoses Final diagnoses:  AKI (acute kidney injury) ([place])  Anemia, unspecified type  Acute renal failure superimposed on chronic kidney disease, unspecified CKD stage, unspecified acute renal failure type [facility])    Rx / DC Orders ED Discharge Orders    None       Varney Biles, MD 04/21/19 [phone]

## 2019-04-21 NOTE — Progress Notes (Signed)
Wife was in to visit patient. Also his son, Annie Main was in to visit, he was asked to bring in his dad's Dulaglutide which the patient receives every Sunday.

## 2019-04-22 ENCOUNTER — Other Ambulatory Visit: Payer: Medicare Other

## 2019-04-22 ENCOUNTER — Ambulatory Visit: Payer: Medicare Other | Admitting: Cardiovascular Disease

## 2019-04-22 LAB — GLUCOSE, CAPILLARY
Glucose-Capillary: 85 mg/dL (ref 70–99)
Glucose-Capillary: 148 mg/dL — ABNORMAL HIGH (ref 70–99)
Glucose-Capillary: 119 mg/dL — ABNORMAL HIGH (ref 70–99)
Glucose-Capillary: 105 mg/dL — ABNORMAL HIGH (ref 70–99)
Glucose-Capillary: 82 mg/dL (ref 70–99)
Glucose-Capillary: 76 mg/dL (ref 70–99)

## 2019-04-22 LAB — BASIC METABOLIC PANEL
Anion gap: 10 (ref 5–15)
BUN: 74 mg/dL — ABNORMAL HIGH (ref 8–23)
CO2: 19 mmol/L — ABNORMAL LOW (ref 22–32)
Calcium: 8.2 mg/dL — ABNORMAL LOW (ref 8.9–10.3)
Chloride: 103 mmol/L (ref 98–111)
Creatinine, Ser: 4.31 mg/dL — ABNORMAL HIGH (ref 0.61–1.24)
GFR calc Af Amer: 15 mL/min — ABNORMAL LOW (ref >60)
GFR calc non Af Amer: 13 mL/min — ABNORMAL LOW (ref >60)
Glucose, Bld: 102 mg/dL — ABNORMAL HIGH (ref 70–99)
Potassium: 4.6 mmol/L (ref 3.5–5.1)
Sodium: 132 mmol/L — ABNORMAL LOW (ref 135–145)

## 2019-04-22 LAB — CBC
HCT: 22 % — ABNORMAL LOW (ref 39.0–52.0)
Hemoglobin: 6.9 g/dL — CL (ref 13.0–17.0)
MCH: 30.5 pg (ref 26.0–34.0)
MCHC: 31.4 g/dL (ref 30.0–36.0)
MCV: 97.3 fL (ref 80.0–100.0)
Platelets: 190 10*3/uL (ref 150–400)
RBC: 2.26 MIL/uL — ABNORMAL LOW (ref 4.22–5.81)
RDW: 14.6 % (ref 11.5–15.5)
WBC: 8 10*3/uL (ref 4.0–10.5)
nRBC: 0 % (ref 0.0–0.2)

## 2019-04-22 LAB — MAGNESIUM: Magnesium: 2.1 mg/dL (ref 1.7–2.4)

## 2019-04-22 LAB — CREATININE, URINE, RANDOM: Creatinine, Urine: 172.11 mg/dL

## 2019-04-22 LAB — PHOSPHORUS: Phosphorus: 4.6 mg/dL (ref 2.5–4.6)

## 2019-04-22 LAB — HEMOGLOBIN A1C
Hgb A1c MFr Bld: 7.4 % — ABNORMAL HIGH (ref 4.8–5.6)
Mean Plasma Glucose: 165.68 mg/dL

## 2019-04-22 LAB — PREPARE RBC (CROSSMATCH)

## 2019-04-22 LAB — SODIUM, URINE, RANDOM: Sodium, Ur: 21 mmol/L

## 2019-04-22 MED ORDER — INSULIN GLARGINE 100 UNIT/ML ~~LOC~~ SOLN
10.0000 [IU] | Freq: Every day | SUBCUTANEOUS | Status: DC
  Administered 2019-04-22 – 2019-04-24 (×2): 10 [IU] via SUBCUTANEOUS
  Filled 2019-04-22 (×4): qty 0.1

## 2019-04-22 MED ORDER — DULAGLUTIDE 1.5 MG/0.5ML ~~LOC~~ SOAJ: 1.5000 mg | SUBCUTANEOUS | Status: DC

## 2019-04-22 MED ORDER — HEPARIN SODIUM (PORCINE) 5000 UNIT/ML IJ SOLN
5000.0000 [IU] | Freq: Three times a day (TID) | INTRAMUSCULAR | Status: DC
  Administered 2019-04-23 – 2019-04-25 (×7): 5000 [IU] via SUBCUTANEOUS
  Filled 2019-04-22 (×8): qty 1

## 2019-04-22 MED ORDER — SODIUM CHLORIDE 0.9% IV SOLUTION: Freq: Once | INTRAVENOUS | Status: AC

## 2019-04-22 MED ORDER — INSULIN ASPART 100 UNIT/ML ~~LOC~~ SOLN
5.0000 [IU] | Freq: Two times a day (BID) | SUBCUTANEOUS | Status: DC
  Administered 2019-04-23 – 2019-04-25 (×3): 5 [IU] via SUBCUTANEOUS

## 2019-04-22 NOTE — Progress Notes (Deleted)
Chief Complaint:  No chief complaint on file.  History of Present Illness: 69 yo male with history of DM, HTN, HLD, CAD s/p prior stenting, metastatic urothelial carcinoma here today for cardiac follow up. Last cath in August 2012 per Dr. [name]. The previously stented Diagonal branch had a patent Taxus stent but there was a sub-total occlusion prior to the stent in the diagonal branch that was unable to be opened. He has done well with medical management since then. He is intolerant to statins. He was not seen in our office from 2015 until 2020. He was admitted to [facility] November 2020 with chest pain and mild elevation troponin (peak high sensitivity troponin 80). He was found to be anemic with CKD. He was treated with heparin, No cardiac cath secondary to CKD. Echo with LVEF=60-65% with no wall motion abnormalities.He was seen in follow up by Truitt Merle, NP on 01/02/19 and described ongoing exertional chest pain. His Imdur was increased to 60 mg daily and his metoprolol was increased to 50 mg BID. He questioned whether a cardiac cath was possible given his kidney disease. He is currently receiving ongoing chemotherapy for his urothelial cancer which is metastatic to his liver.   He is here today for follow up. The patient denies any chest pain, dyspnea, palpitations, lower extremity edema, orthopnea, PND, dizziness, near syncope or syncope.     Primary Care Physician: [name], MD   Past Medical History:  Diagnosis Date  . Bladder tumor   . Cancer (HCC)    BLADDER CANCER  . Coronary artery disease cardiologist-- dr [name] (per pt last office visit 10/ 2018)   s/p  PCI w/ DES x1 to diagonal branch of LAD  . DDD (degenerative disc disease), lumbar   . Degenerative joint disease   . History of concussion    per pt age 25 w/ brief loc--- no residual  . Hyperlipidemia   . Hypertension   . OA (osteoarthritis)   . S/P drug eluting coronary stent placement 10/24/2007   diagonal branch of  LAD  . Type 2 diabetes mellitus treated with insulin ([place])    followed by pcp  . Varicose vein of leg   . Wears glasses     Past Surgical History:  Procedure Laterality Date  . APPENDECTOMY  age 1  . COLONOSCOPY  2012  . CORONARY ANGIOPLASTY  08-26-2010   dr [name]   previous stent patent,  proximal prior to previous stent 99% stenosis;  Unsuccessful multiple attempts PCI, unability to cross with a ballon;  medical management  . CORONARY ANGIOPLASTY WITH STENT PLACEMENT  10-23-2017  dr Darnell Level brodie   positive ischemia on myoview;   40% pLAD and D1 80%,  diagonal branch treated w/ PCI and DES   . EXCISION MIDLINE SUPERIOR THYOID ISTHMUS MASS  11-27-2008   dr Ernesto Rutherford   bening lipoma  . KNEE ARTHROSCOPY Left 01-22-2004   dr Percell Miller  . POSTERIOR LUMBAR FUSION  11-14-2012   dr Christella Noa @[facility]    L3 -- 5  . SHOULDER ARTHROSCOPY Right 2019  . SHOULDER ARTHROSCOPY WITH ROTATOR CUFF REPAIR AND SUBACROMIAL DECOMPRESSION Left 11-12-2009   dr Percell Miller  @[facility]    w/ Labral debridement and DCR  . TONSILLECTOMY  age 54  . TOTAL KNEE ARTHROPLASTY Left 02/17/2016   Procedure: TOTAL KNEE ARTHROPLASTY;  Surgeon: Ninetta Lights, MD;  Location: [facility];  Service: Orthopedics;  Laterality: Left;  . TOTAL KNEE ARTHROPLASTY Right 02-25-2004   dr Percell Miller  @[facility]   .  TRANSURETHRAL RESECTION OF BLADDER TUMOR N/A 08/31/2018   Procedure: TRANSURETHRAL RESECTION OF BLADDER TUMOR (TURBT) WITH INSTILLATION OF POST OPERATIVE CHEMOTHERAPY;  Surgeon: Kathie Rhodes, MD;  Location: [facility];  Service: Urology;  Laterality: N/A;  . VARICOSE VEIN SURGERY  1995    No current facility-administered medications for this visit.   No current outpatient medications on file.   Facility-Administered Medications Ordered in Other Visits  Medication Dose Route Frequency Provider Last Rate Last Admin  . 0.9 %  sodium chloride infusion (Manually program via Guardrails IV Fluids)   Intravenous Once Lang Snow, NP      . 0.9 %  sodium  chloride infusion   Intravenous Continuous Kayleen Memos, DO 75 mL/hr at 04/22/19 0342 New Bag at 04/22/19 0342  . acetaminophen (TYLENOL) tablet 650 mg  650 mg Oral Q6H PRN Omar Person, NP   650 mg at 04/21/19 2131  . amLODipine (NORVASC) tablet 5 mg  5 mg Oral Daily Fair, Marin Shutter, MD   5 mg at 04/21/19 1001  . aspirin EC tablet 81 mg  81 mg Oral Daily Fair, Marin Shutter, MD   81 mg at 04/21/19 1000  . clopidogrel (PLAVIX) tablet 75 mg  75 mg Oral Daily Fair, Marin Shutter, MD   75 mg at 04/21/19 1000  . Dulaglutide SOPN 1.5 mg  1.5 mg Subcutaneous Q Sun Fair, Chelsea N, MD      . heparin injection 5,000 Units  5,000 Units Subcutaneous Q8H Chauncey Mann, MD   5,000 Units at 04/22/19 0600  . HYDROcodone-acetaminophen (NORCO) 7.5-325 MG per tablet 1 tablet  1 tablet Oral Q6H PRN Omar Person, NP   1 tablet at 04/22/19 0211  . HYDROmorphone (DILAUDID) tablet 1 mg  1 mg Oral Q4H PRN Omar Person, NP   1 mg at 04/22/19 0600  . icosapent Ethyl (VASCEPA) 1 g capsule 2 g  2 g Oral BID Chauncey Mann, MD   2 g at 04/21/19 2131  . insulin aspart (novoLOG) injection 0-9 Units  0-9 Units Subcutaneous TID WC Chauncey Mann, MD   1 Units at 04/21/19 1847  . insulin aspart (novoLOG) injection 10 Units  10 Units Subcutaneous BID WC Chauncey Mann, MD   10 Units at 04/21/19 1847  . insulin glargine (LANTUS) injection 30 Units  30 Units Subcutaneous QHS Chauncey Mann, MD   30 Units at 04/21/19 2132  . isosorbide mononitrate (IMDUR) 24 hr tablet 120 mg  120 mg Oral Daily Fair, Marin Shutter, MD   120 mg at 04/21/19 1000  . metoprolol tartrate (LOPRESSOR) tablet 50 mg  50 mg Oral BID Chauncey Mann, MD   50 mg at 04/21/19 2131  . nitroGLYCERIN (NITROSTAT) SL tablet 0.4 mg  0.4 mg Sublingual Q5 min PRN Fair, Chelsea N, MD      . pantoprazole (PROTONIX) EC tablet 20 mg  20 mg Oral Daily Fair, Marin Shutter, MD   20 mg at 04/21/19 1001  . prochlorperazine (COMPAZINE) tablet 10 mg  10 mg Oral Q6H PRN  Fair, Chelsea N, MD      . protein supplement (ENSURE MAX) liquid  11 oz Oral Daily Irene Pap N, DO   11 oz at 04/21/19 1359    Allergies  Allergen Reactions  . Lisinopril Other (See Comments)    Severe cough Hair ball in throat  . Statins Hives and Other (See Comments)    Other reaction(s): Cough (ALLERGY/intolerance), Myalgias (intolerance)  No cholesterol meds  . Atorvastatin Other (See Comments)    REACTION: myalgies  . Rosuvastatin Itching    Social History   Socioeconomic History  . Marital status: Married    Spouse name: Not on file  . Number of children: 4  . Years of education: Not on file  . Highest education level: Not on file  Occupational History  . Occupation: [employer]    Employer: UNEMPLOYED  Tobacco Use  . Smoking status: Never Smoker  . Smokeless tobacco: Never Used  Substance and Sexual Activity  . Alcohol use: No  . Drug use: No  . Sexual activity: Not on file  Other Topics Concern  . Not on file  Social History Narrative   The patient lives in [place] with his wife. He works in Leisure centre manager. He denies tobacco or alcohol abuse . He denies regular exercise.   Social Determinants of Health   Financial Resource Strain:   . Difficulty of Paying Living Expenses:   Food Insecurity:   . Worried About Charity fundraiser in the Last Year:   . Arboriculturist in the Last Year:   Transportation Needs:   . Film/video editor (Medical):   Marland Kitchen Lack of Transportation (Non-Medical):   Physical Activity:   . Days of Exercise per Week:   . Minutes of Exercise per Session:   Stress:   . Feeling of Stress :   Social Connections:   . Frequency of Communication with Friends and Family:   . Frequency of Social Gatherings with Friends and Family:   . Attends Religious Services:   . Active Member of Clubs or Organizations:   . Attends Archivist Meetings:   Marland Kitchen Marital Status:   Intimate Partner Violence:    . Fear of Current or Ex-Partner:   . Emotionally Abused:   Marland Kitchen Physically Abused:   . Sexually Abused:     Family History  Problem Relation Age of Onset  . Heart attack Father   . Hypertension Father   . Heart attack Brother     Review of Systems:  As stated in the HPI and otherwise negative.   There were no vitals taken for this visit.  Physical Examination: General: Well developed, well nourished, NAD  HEENT: OP clear, mucus membranes moist  SKIN: warm, dry. No rashes. Neuro: No focal deficits  Musculoskeletal: Muscle strength 5/5 all ext  Psychiatric: Mood and affect normal  Neck: No JVD, no carotid bruits, no thyromegaly, no lymphadenopathy.  Lungs:Clear bilaterally, no wheezes, rhonci, crackles Cardiovascular: Regular rate and rhythm. No murmurs, gallops or rubs. Abdomen:Soft. Bowel sounds present. Non-tender.  Extremities: No lower extremity edema. Pulses are 2 + in the bilateral DP/PT.  EKG is not performed today EKG is reviewed by me and shows   Cardiac cath 08/26/10: 1. Right coronary artery: The right coronary artery was a small,  nondominant vessel.  2. Left main: The left main was short with no angiographic coronary  artery disease.  3. Left circumflex: The left circumflex system was dominant. There  were luminal irregularities. There was a left-sided PDA as well as a  large PLOM.  4. LAD system: The LAD itself had luminal irregularities. There was  a moderate-sized first diagonal. The first diagonal had a drug-  eluting stent about midway down the vessel. This was patent,  however, there was a 99% stenosis just prior to the stent with TIMI  2 flow down the rest of  the vessel. The stenosis extended almost  back, but not quite back to the ostium.  Echo November 2020:  1. Left ventricular ejection fraction, by visual estimation, is 60 to 65%. The left ventricle has normal function. There is mildly increased left ventricular hypertrophy.  2. Left ventricular  diastolic parameters are indeterminate.  3. Global right ventricle has normal systolic function.The right ventricular size is normal. No increase in right ventricular wall thickness.  4. Left atrial size was moderately dilated.  5. Right atrial size was normal.  6. Trivial pericardial effusion is present.  7. The mitral valve is normal in structure. No evidence of mitral valve regurgitation.  8. The tricuspid valve is normal in structure. Tricuspid valve regurgitation is trivial.  9. There is Mild calcification of the aortic valve. 10. The aortic valve is tricuspid. Aortic valve regurgitation is not visualized. 11. The pulmonic valve was not well visualized. Pulmonic valve regurgitation is not visualized. 12. Normal pulmonary artery systolic pressure. 13. The inferior vena cava is normal in size with <50% respiratory variability, suggesting right atrial pressure of 8 mmHg.   Assessment and Plan:   1. CAD with unstable angina: Chest pain improved on Imdur and Metoprolol. Hopefully we can avoid a cardiac cath for now given his CKD. Continue ASA, Plavix, Imdur and metoprolol. I will see him back in 53months. He will call if his chest pain worsens prior to then.   2. HTN: BP is controlled.  3. HLD: He is on Repatha and Vascepa.    Current medicines are reviewed at length with the patient today.  The patient does not have concerns regarding medicines.  The following changes have been made:  no change  Labs/ tests ordered today include:  No orders of the defined types were placed in this encounter.    Disposition:   FU with me in 3 months   Signed, Lauree Chandler, MD 04/22/2019 6:54 AM    [address], [place], [place]  [address] Phone: [phone]; Fax: [phone]

## 2019-04-22 NOTE — Progress Notes (Signed)
Physical Therapy Treatment Patient Details Name: Chad Yu MRN: [id number] DOB: November 26, 1950 Today's Date: 04/22/2019    History of Present Illness 69 year old male comes in a chief complaint of chest pain and weakness. 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    PT Comments    Pt reports nausea and vomiting occurring earlier today after eating. He was agreeable to taking a short walk. Supv-Min guard assist for mobility on today. He did state he feels a little weak. Will recommend HHPT f/u if pt/wife are agreeable.     Follow Up Recommendations  Home health PT;Supervision for mobility/OOB     Equipment Recommendations  None recommended by PT    Recommendations for Other Services       Precautions / Restrictions Precautions Precautions: Fall Restrictions Weight Bearing Restrictions: No    Mobility  Bed Mobility Overal bed mobility: Needs Assistance Bed Mobility: Supine to Sit;Sit to Supine     Supine to sit: Supervision;HOB elevated Sit to supine: Supervision;HOB elevated   General bed mobility comments: Increased time. Pt relied on bedrail  Transfers Overall transfer level: Needs assistance Equipment used: Rolling walker (2 wheeled) Transfers: Sit to/from Stand Sit to Stand: Min guard;From elevated surface         General transfer comment: for safety. VCs hand placement  Ambulation/Gait Ambulation/Gait assistance: Min guard Gait Distance (Feet): 75 Feet Assistive device: Rolling walker (2 wheeled) Gait Pattern/deviations: Step-through pattern;Decreased stride length     General Gait Details: pt declined to ambulate  any further on today. he has been experiencing nausea and vomiting on today.   Stairs             Wheelchair Mobility    Modified Rankin (Stroke Patients Only)       Balance Overall balance assessment: Needs assistance         Standing balance support: Bilateral upper extremity supported Standing balance-Leahy Scale: Poor                              Cognition Arousal/Alertness: Awake/alert Behavior During Therapy: WFL for tasks assessed/performed Overall Cognitive Status: Within Functional Limits for tasks assessed                                        Exercises      General Comments        Pertinent Vitals/Pain Pain Assessment: No/denies pain    Home Living                      Prior Function            PT Goals (current goals can now be found in the care plan section) Progress towards PT goals: Progressing toward goals    Frequency    Min 3X/week      PT Plan      Co-evaluation              AM-PAC PT "6 Clicks" Mobility   Outcome Measure  Help needed turning from your back to your side while in a flat bed without using bedrails?: None  Help needed moving from lying on your back to sitting on the side of a flat bed without using bedrails?: A Little Help needed moving to and from a bed to a chair (including a wheelchair)?: A Little Help needed standing up from a chair using your arms (e.g., wheelchair or bedside chair)?: A Little Help needed to walk in hospital room?: A Little Help needed climbing 3-5 steps with a railing? : A Little 6 Click Score: 19    End of Session Equipment Utilized During Treatment: Gait belt Activity Tolerance: Patient limited by fatigue Patient left: in bed;with call bell/phone within reach;with bed alarm set   PT Visit Diagnosis: Muscle weakness (generalized) (M62.81);Difficulty in walking, not elsewhere classified (R26.2)     Time: [id number] PT Time Calculation  (min) (ACUTE ONLY): 17 min  Charges:  $Gait Training: 8-22 mins              Doreatha Massed, PT Acute Rehabilitation

## 2019-04-22 NOTE — Progress Notes (Signed)
PROGRESS NOTE  Chad Yu YBO:[id number] DOB: [birth year] DOA: 04/20/2019 PCP: [name], MD  HPI/Recap of past 24 hours: Chad Yu is a 69 y.o. male with PMH of CKDIIIB, anemia of chronic disease, CAD s/p PCI, HTN, HLD, bladder cancer currently on chemo, T2DM presented to [facility] ED with chest pain which resolved but admitted for AKI on CKDIIIB.  Reports generalized weakness and poor oral intake for the past 1-2 weeks.  Significantly dehydrated on presentation with AKI and hyperkalemia.  TRH asked to admit.  04/22/19: No acute events overnight.  Worsening renal function this morning.  Nephrology and urology consulted.    Assessment/Plan: Principal Problem:   Acute renal failure superimposed on stage 3b chronic kidney disease (HCC) Active Problems:   Type 2 diabetes with nephropathy (HCC)   Hyperlipidemia   Essential hypertension   CAD, NATIVE VESSEL   GERD   CHEST PAIN   Bladder cancer ([place])   Renal insufficiency   AKI (acute kidney injury) ([place])   Anemia associated with chemotherapy   Hyponatremia  AKI on CKD IIIB suspect prerenal in the setting of dehydration from poor oral intake Presented with cr 4.04, proteinuria, moderate hemoglobin on UA and hypovolemic in the setting of poor oral intake, started on normal saline at 75 cc/h.  Renal ultrasound showed mild to moderate left-sided hydronephrosis and bilateral nephroliths Baseline cr 1.88 with GFR 38 Creatinine trending up 4.3 with GFR of 13. Urology and nephrology consulted. Urine lites ordered. Continue to monitor urine output Continue to avoid nephrotoxins, hypotension and dehydration. Daily BMP  Acute blood loss, likely multifactorial in the setting of hematuria on UA, CKD and malignancy Drop in hemoglobin this morning down to 6.9 1 unit PRBC being transfused Repeat H&H posttransfusion  Hyperkalemia, likely in the setting of AKI K+ 5.3>> 4.6, post 1 dose of Lokelma 10 mg. Continue to monitor repeat BMP in the  morning  Insulin-dependent diabetes with hyperglycemia and nephropathy -A1c 7.4 in Jan 2021 -On insulin sliding scale, Lantus and NovoLog. -Avoid hypoglycemia  Mild to moderate L hydronephrosis/bilateral nephroliths Minimal residual from bladder scan on 4/4 Urology consulted  Splenomegaly, incidentally found Incidental findings Surveillance Avoid abdominal trauma and contact sports  Pseudohyponatremia Management per above  Dehydration from poor oral intake Continue IV fluid Encourage oral intake IV anti emetics PRN  Essential HTN BP is at goal C/ current cardiac meds  Anemia of chronic disease in the setting of maligancy and CKD Hg 7.6 Baseline Hg 9 Transfuse Hg <7.0  Severe protein calorie malnutrition Albumin 2.5 Muscle mass loss Oral supplement Encourage increase protein calorie intake  Abnormal LFTs Elevated alk phos and ALT AST and Tbili normal Possibly related to chemo  Resolved Chest Pain CAD -chest pain absent on admission -Trop WNL -Cont aspirin, Plavix, beta-blocker, adjusted dose of nitrates and as needed nitroglycerin -No ACE inhibitors/ARB in the setting of acute renal insufficiency on chronic kidney disease currently  Bladder cancer -Ongoing chemotherapy  Chronic dCHF Last 2D echo 03/19/19 showed LVEF 55-60% GIDD Strict I&O and daily weight Monitor volume status while on IV fluid   Gastroesophageal reflux disease -Cont Protonix   HLD -Continue Vascepa; allergic reactions to the use of statins   Code Status: DNR: Declines chest compressions and intubation. Okay with NIV, pressors, ICU-level care.  DVT Prophylaxis:  Restart heparin subcu 3 times daily tomorrow post blood transfusion. Family Communication:  Updated his son at bedside.  Disposition Plan: Patient is from home.  Anticipate dc to home with HHS in  48-72 hours once AKI has improved or urology and nephrology have signed off.     Objective: Vitals:   04/22/19 0619  04/22/19 0819 04/22/19 0901 04/22/19 1145  BP: 114/66 (!) 106/53 122/64 119/72  Pulse: 69 78 85 67  Resp: 18 18    Temp: 98.6 F (37 C) 98 F (36.7 C) 98.2 F (36.8 C) 98.1 F (36.7 C)  TempSrc: Oral Oral Oral Oral  SpO2: 95% 97% 98% 93%  Weight:      Height:        Intake/Output Summary (Last 24 hours) at 04/22/2019 1245 Last data filed at 04/22/2019 1135 Gross per 24 hour  Intake 1894.17 ml  Output 985 ml  Net 909.17 ml   Filed Weights   04/20/19 1958 04/21/19 0126  Weight: 103 kg 96.2 kg    Exam:  . General: 69 y.o. year-old male well-developed well-nourished no distress.  Alert and oriented x3. . Cardiovascular: Regular rate and rhythm no rubs or gallops.   Marland Kitchen Respiratory: Clear to auscultation no wheezes or rales.   . Abdomen: Soft obese normal bowel sounds present . Musculoskeletal: No lower extremity edema bilaterally.   Marland Kitchen Psychiatry: Mood is appropriate for condition and setting.  Data Reviewed: CBC: Recent Labs  Lab 04/20/19 2045 04/21/19 0520 04/22/19 0538  WBC 10.0 8.3 8.0  NEUTROABS 8.7*  --   --   HGB 8.1* 7.6* 6.9*  HCT 26.3* 24.5* 22.0*  MCV 96.7 95.0 97.3  PLT 225 216 992   Basic Metabolic Panel: Recent Labs  Lab 04/20/19 2045 04/20/19 2335 04/21/19 0520 04/21/19 0619 04/21/19 1533 04/22/19 0538  NA 130*  --  133*  --   --  132*  K 5.4*  --  5.3*  --  5.1 4.6  CL 100  --  103  --   --  103  CO2 19*  --  21*  --   --  19*  GLUCOSE 310*  --  194*  --   --  102*  BUN 71*  --  69*  --   --  74*  CREATININE 4.04*  --  4.09*  --   --  4.31*  CALCIUM 8.6*  --  8.4*  --   --  8.2*  MG  --  2.2  --   --   --  2.1  PHOS  --   --   --  4.6  --  4.6   GFR: Estimated Creatinine Clearance: 19.4 mL/min (A) (by C-G formula based on SCr of 4.31 mg/dL (H)). Liver Function Tests: Recent Labs  Lab 04/21/19 0520  AST 40  ALT 59*  ALKPHOS 314*  BILITOT 0.8  PROT 6.0*  ALBUMIN 2.5*   No results for input(s): LIPASE, AMYLASE in the last 168  hours. No results for input(s): AMMONIA in the last 168 hours. Coagulation Profile: Recent Labs  Lab 04/20/19 2045  INR 1.4*   Cardiac Enzymes: No results for input(s): CKTOTAL, CKMB, CKMBINDEX, TROPONINI in the last 168 hours. BNP (last 3 results) No results for input(s): PROBNP in the last 8760 hours. HbA1C: No results for input(s): HGBA1C in the last 72 hours. CBG: Recent Labs  Lab 04/21/19 1633 04/21/19 2109 04/22/19 0740 04/22/19 0927 04/22/19 1202  GLUCAP 121* 123* 85 148* 119*   Lipid Profile: No results for input(s): CHOL, HDL, LDLCALC, TRIG, CHOLHDL, LDLDIRECT in the last 72 hours. Thyroid Function Tests: No results for input(s): TSH, T4TOTAL, FREET4, T3FREE, THYROIDAB in the last 72  hours. Anemia Panel: Recent Labs    04/21/19 0520  FERRITIN 3,072*  TIBC 174*  IRON 21*   Urine analysis:    Component Value Date/Time   COLORURINE YELLOW 04/20/2019 [place] 04/20/2019 2345   LABSPEC 1.015 04/20/2019 2345   PHURINE 5.0 04/20/2019 2345   GLUCOSEU NEGATIVE 04/20/2019 2345   HGBUR MODERATE (A) 04/20/2019 2345   BILIRUBINUR NEGATIVE 04/20/2019 2345   KETONESUR NEGATIVE 04/20/2019 2345   PROTEINUR 30 (A) 04/20/2019 2345   UROBILINOGEN 0.2 11/26/2008 0947   NITRITE NEGATIVE 04/20/2019 2345   LEUKOCYTESUR NEGATIVE 04/20/2019 2345   Sepsis Labs: @LABRCNTIP (procalcitonin:4,lacticidven:4)  ) Recent Results (from the past 240 hour(s))  Respiratory Panel by RT PCR (Flu A&B, Covid) - Nasopharyngeal Swab     Status: None   Collection Time: 04/20/19 11:50 PM   Specimen: Nasopharyngeal Swab  Result Value Ref Range Status   SARS Coronavirus 2 by RT PCR NEGATIVE NEGATIVE Final    Comment: (NOTE) SARS-CoV-2 target nucleic acids are NOT DETECTED. The SARS-CoV-2 RNA is generally detectable in upper respiratoy specimens during the acute phase of infection. The lowest concentration of SARS-CoV-2 viral copies this assay can detect is 131 copies/mL. A  negative result does not preclude SARS-Cov-2 infection and should not be used as the sole basis for treatment or other patient management decisions. A negative result may occur with  improper specimen collection/handling, submission of specimen other than nasopharyngeal swab, presence of viral mutation(s) within the areas targeted by this assay, and inadequate number of viral copies (<131 copies/mL). A negative result must be combined with clinical observations, patient history, and epidemiological information. The expected result is Negative. Fact Sheet for Patients:  PinkCheek.be Fact Sheet for Healthcare Providers:  GravelBags.it This test is not yet ap proved or cleared by the [place] FDA and  has been authorized for detection and/or diagnosis of SARS-CoV-2 by FDA under an Emergency Use Authorization (EUA). This EUA will remain  in effect (meaning this test can be used) for the duration of the COVID-19 declaration under Section 564(b)(1) of the Act, 21 U.S.C. section 360bbb-3(b)(1), unless the authorization is terminated or revoked sooner.    Influenza A by PCR NEGATIVE NEGATIVE Final   Influenza B by PCR NEGATIVE NEGATIVE Final    Comment: (NOTE) The Xpert Xpress SARS-CoV-2/FLU/RSV assay is intended as an aid in  the diagnosis of influenza from Nasopharyngeal swab specimens and  should not be used as a sole basis for treatment. Nasal washings and  aspirates are unacceptable for Xpert Xpress SARS-CoV-2/FLU/RSV  testing. Fact Sheet for Patients: PinkCheek.be Fact Sheet for Healthcare Providers: GravelBags.it This test is not yet approved or cleared by the [place] FDA and  has been authorized for detection and/or diagnosis of SARS-CoV-2 by  FDA under an Emergency Use Authorization (EUA). This EUA will remain  in effect (meaning this test can be used) for  the duration of the  Covid-19 declaration under Section 564(b)(1) of the Act, 21  U.S.C. section 360bbb-3(b)(1), unless the authorization is  terminated or revoked. Performed at [facility], [place] [address] [place], [place] [address]       Studies: No results found.  Scheduled Meds: . amLODipine  5 mg Oral Daily  . aspirin EC  81 mg Oral Daily  . clopidogrel  75 mg Oral Daily  . Dulaglutide  1.5 mg Subcutaneous Q Sun  . [START ON 04/23/2019] heparin  5,000 Units Subcutaneous Q8H  . icosapent Ethyl  2  g Oral BID  . insulin aspart  0-9 Units Subcutaneous TID WC  . insulin aspart  10 Units Subcutaneous BID WC  . insulin glargine  30 Units Subcutaneous QHS  . isosorbide mononitrate  120 mg Oral Daily  . metoprolol tartrate  50 mg Oral BID  . pantoprazole  20 mg Oral Daily  . Ensure Max Protein  11 oz Oral Daily    Continuous Infusions: . sodium chloride 75 mL/hr at 04/22/19 0342     LOS: 2 days     Kayleen Memos, MD [facility] Pager [phone]  If 7PM-7AM, please contact night-coverage [email or web address] Password [facility] 04/22/2019, 12:45 PM

## 2019-04-23 ENCOUNTER — Encounter (HOSPITAL_COMMUNITY)
Admission: EM | Disposition: A | Discharge status: Home Health | Payer: Self-pay | Source: Home / Self Care | Attending: Internal Medicine

## 2019-04-23 ENCOUNTER — Inpatient Hospital Stay (HOSPITAL_COMMUNITY): Payer: Medicare Other | Admitting: Anesthesiology

## 2019-04-23 ENCOUNTER — Encounter (HOSPITAL_COMMUNITY): Payer: Self-pay | Admitting: Family Medicine

## 2019-04-23 ENCOUNTER — Inpatient Hospital Stay (HOSPITAL_COMMUNITY): Payer: Medicare Other

## 2019-04-23 HISTORY — PX: CYSTOSCOPY W/ URETERAL STENT PLACEMENT: SHX1429

## 2019-04-23 LAB — GLUCOSE, CAPILLARY
Glucose-Capillary: 115 mg/dL — ABNORMAL HIGH (ref 70–99)
Glucose-Capillary: 113 mg/dL — ABNORMAL HIGH (ref 70–99)
Glucose-Capillary: 119 mg/dL — ABNORMAL HIGH (ref 70–99)
Glucose-Capillary: 105 mg/dL — ABNORMAL HIGH (ref 70–99)
Glucose-Capillary: 129 mg/dL — ABNORMAL HIGH (ref 70–99)
Glucose-Capillary: 95 mg/dL (ref 70–99)

## 2019-04-23 LAB — HEPATIC FUNCTION PANEL
ALT: 38 U/L (ref 0–44)
AST: 28 U/L (ref 15–41)
Albumin: 2.4 g/dL — ABNORMAL LOW (ref 3.5–5.0)
Alkaline Phosphatase: 226 U/L — ABNORMAL HIGH (ref 38–126)
Bilirubin, Direct: 0.5 mg/dL — ABNORMAL HIGH (ref 0.0–0.2)
Indirect Bilirubin: 0.6 mg/dL (ref 0.3–0.9)
Total Bilirubin: 1.1 mg/dL (ref 0.3–1.2)
Total Protein: 5.8 g/dL — ABNORMAL LOW (ref 6.5–8.1)

## 2019-04-23 LAB — BPAM RBC
Blood Product Expiration Date: 202105032359
ISSUE DATE / TIME: 202104050825
Unit Type and Rh: 5100

## 2019-04-23 LAB — CBC
HCT: 28.4 % — ABNORMAL LOW (ref 39.0–52.0)
Hemoglobin: 8.6 g/dL — ABNORMAL LOW (ref 13.0–17.0)
MCH: 29.1 pg (ref 26.0–34.0)
MCHC: 30.3 g/dL (ref 30.0–36.0)
MCV: 95.9 fL (ref 80.0–100.0)
Platelets: 224 10*3/uL (ref 150–400)
RBC: 2.96 MIL/uL — ABNORMAL LOW (ref 4.22–5.81)
RDW: 15.3 % (ref 11.5–15.5)
WBC: 8.5 10*3/uL (ref 4.0–10.5)
nRBC: 0 % (ref 0.0–0.2)

## 2019-04-23 LAB — TYPE AND SCREEN
ABO/RH(D): O POS
Antibody Screen: NEGATIVE
Unit division: 0

## 2019-04-23 LAB — BASIC METABOLIC PANEL
Anion gap: 11 (ref 5–15)
BUN: 75 mg/dL — ABNORMAL HIGH (ref 8–23)
CO2: 18 mmol/L — ABNORMAL LOW (ref 22–32)
Calcium: 8.5 mg/dL — ABNORMAL LOW (ref 8.9–10.3)
Chloride: 104 mmol/L (ref 98–111)
Creatinine, Ser: 4 mg/dL — ABNORMAL HIGH (ref 0.61–1.24)
GFR calc Af Amer: 17 mL/min — ABNORMAL LOW (ref >60)
GFR calc non Af Amer: 14 mL/min — ABNORMAL LOW (ref >60)
Glucose, Bld: 111 mg/dL — ABNORMAL HIGH (ref 70–99)
Potassium: 5.2 mmol/L — ABNORMAL HIGH (ref 3.5–5.1)
Sodium: 133 mmol/L — ABNORMAL LOW (ref 135–145)

## 2019-04-23 LAB — LIPASE, BLOOD: Lipase: 16 U/L (ref 11–51)

## 2019-04-23 SURGERY — CYSTOSCOPY, WITH RETROGRADE PYELOGRAM AND URETERAL STENT INSERTION
Anesthesia: General | Laterality: Left

## 2019-04-23 MED ORDER — FENTANYL CITRATE (PF) 100 MCG/2ML IJ SOLN: 25.0000 ug | INTRAMUSCULAR | Status: DC | PRN

## 2019-04-23 MED ORDER — CEFAZOLIN SODIUM-DEXTROSE 2-4 GM/100ML-% IV SOLN
INTRAVENOUS | Status: AC
  Filled 2019-04-23: qty 100

## 2019-04-23 MED ORDER — ONDANSETRON HCL 4 MG/2ML IJ SOLN
INTRAMUSCULAR | Status: DC | PRN
  Administered 2019-04-23: 4 mg via INTRAVENOUS

## 2019-04-23 MED ORDER — FENTANYL CITRATE (PF) 250 MCG/5ML IJ SOLN
INTRAMUSCULAR | Status: AC
  Filled 2019-04-23: qty 5

## 2019-04-23 MED ORDER — STERILE WATER FOR IRRIGATION IR SOLN
Status: DC | PRN
  Administered 2019-04-23: 3000 mL

## 2019-04-23 MED ORDER — SODIUM ZIRCONIUM CYCLOSILICATE 5 G PO PACK
5.0000 g | PACK | Freq: Three times a day (TID) | ORAL | Status: DC
  Filled 2019-04-23: qty 1

## 2019-04-23 MED ORDER — FENTANYL CITRATE (PF) 100 MCG/2ML IJ SOLN
INTRAMUSCULAR | Status: DC | PRN
  Administered 2019-04-23 (×2): 25 ug via INTRAVENOUS
  Administered 2019-04-23: 50 ug via INTRAVENOUS
  Administered 2019-04-23 (×2): 25 ug via INTRAVENOUS

## 2019-04-23 MED ORDER — MIDAZOLAM HCL 5 MG/5ML IJ SOLN
INTRAMUSCULAR | Status: DC | PRN
  Administered 2019-04-23: 2 mg via INTRAVENOUS

## 2019-04-23 MED ORDER — CEFAZOLIN SODIUM-DEXTROSE 2-4 GM/100ML-% IV SOLN: 2.0000 g | Freq: Once | INTRAVENOUS | Status: DC

## 2019-04-23 MED ORDER — MIDAZOLAM HCL 2 MG/2ML IJ SOLN
INTRAMUSCULAR | Status: AC
  Filled 2019-04-23: qty 2

## 2019-04-23 MED ORDER — ICOSAPENT ETHYL 1 G PO CAPS
2.0000 g | ORAL_CAPSULE | Freq: Two times a day (BID) | ORAL | Status: DC
  Administered 2019-04-23 – 2019-04-25 (×5): 2 g via ORAL
  Filled 2019-04-23 (×6): qty 2

## 2019-04-23 MED ORDER — LIDOCAINE 2% (20 MG/ML) 5 ML SYRINGE
INTRAMUSCULAR | Status: DC | PRN
  Administered 2019-04-23: 60 mg via INTRAVENOUS

## 2019-04-23 MED ORDER — PROPOFOL 10 MG/ML IV BOLUS
INTRAVENOUS | Status: DC | PRN
  Administered 2019-04-23: 150 mg via INTRAVENOUS

## 2019-04-23 MED ORDER — IOHEXOL 300 MG/ML  SOLN
INTRAMUSCULAR | Status: DC | PRN
  Administered 2019-04-23: 15:00:00 20 mL

## 2019-04-23 MED ORDER — SODIUM CHLORIDE 0.9 % IV SOLN: INTRAVENOUS | Status: AC

## 2019-04-23 MED ORDER — SODIUM ZIRCONIUM CYCLOSILICATE 5 G PO PACK
5.0000 g | PACK | Freq: Three times a day (TID) | ORAL | Status: DC
  Administered 2019-04-23: 5 g via ORAL
  Filled 2019-04-23 (×3): qty 1

## 2019-04-23 MED ORDER — PROMETHAZINE HCL 25 MG/ML IJ SOLN: 6.2500 mg | INTRAMUSCULAR | Status: DC | PRN

## 2019-04-23 MED ORDER — LIDOCAINE 2% (20 MG/ML) 5 ML SYRINGE
INTRAMUSCULAR | Status: AC
  Filled 2019-04-23: qty 5

## 2019-04-23 MED ORDER — PROPOFOL 10 MG/ML IV BOLUS
INTRAVENOUS | Status: AC
  Filled 2019-04-23: qty 20

## 2019-04-23 MED ORDER — ONDANSETRON HCL 4 MG/2ML IJ SOLN
INTRAMUSCULAR | Status: AC
  Filled 2019-04-23: qty 2

## 2019-04-23 MED ORDER — SODIUM ZIRCONIUM CYCLOSILICATE 5 G PO PACK
5.0000 g | PACK | Freq: Two times a day (BID) | ORAL | Status: DC
  Administered 2019-04-24 – 2019-04-25 (×3): 5 g via ORAL
  Filled 2019-04-23 (×4): qty 1

## 2019-04-23 MED ORDER — KETOROLAC TROMETHAMINE 30 MG/ML IJ SOLN: 30.0000 mg | Freq: Once | INTRAMUSCULAR | Status: DC | PRN

## 2019-04-23 MED ORDER — CIPROFLOXACIN IN D5W 400 MG/200ML IV SOLN
INTRAVENOUS | Status: AC
  Filled 2019-04-23: qty 200

## 2019-04-23 SURGICAL SUPPLY — 12 items
BAG URO CATCHER STRL LF (MISCELLANEOUS) ×3 IMPLANT
CATH URET 5FR 28IN OPEN ENDED (CATHETERS) ×3 IMPLANT
CLOTH BEACON ORANGE TIMEOUT ST (SAFETY) ×3 IMPLANT
GLOVE BIOGEL M STRL SZ7.5 (GLOVE) ×3 IMPLANT
GOWN STRL REUS W/TWL XL LVL3 (GOWN DISPOSABLE) ×3 IMPLANT
GUIDEWIRE STR DUAL SENSOR (WIRE) ×3 IMPLANT
KIT TURNOVER KIT A (KITS) IMPLANT
MANIFOLD NEPTUNE II (INSTRUMENTS) ×3 IMPLANT
PACK CYSTO (CUSTOM PROCEDURE TRAY) ×3 IMPLANT
STENT URET 6FRX28 CONTOUR (STENTS) ×2 IMPLANT
TUBING CONNECTING 10 (TUBING) ×1 IMPLANT
TUBING CONNECTING 10' (TUBING) ×1

## 2019-04-23 NOTE — Anesthesia Procedure Notes (Signed)
Procedure Name: LMA Insertion Date/Time: 04/23/2019 2:52 PM Performed by: Maxwell Caul, CRNA Pre-anesthesia Checklist: Patient identified, Emergency Drugs available, Suction available and Patient being monitored Patient Re-evaluated:Patient Re-evaluated prior to induction Oxygen Delivery Method: Circle system utilized Preoxygenation: Pre-oxygenation with 100% oxygen Induction Type: IV induction LMA: LMA with gastric port inserted LMA Size: 4.0 Number of attempts: 1 Placement Confirmation: positive ETCO2 and breath sounds checked- equal and bilateral Tube secured with: Tape Dental Injury: Teeth and Oropharynx as per pre-operative assessment

## 2019-04-23 NOTE — Care Management Important Message (Signed)
Important Message  Patient Details IM Letter given to Marney Doctor RN Case Manager to present to the Patient Name: Chad Yu MRN: [id number] Date of Birth: January 25, 1950   Medicare Important Message Given:  Yes     Kerin Salen 04/23/2019, 10:15 AM

## 2019-04-23 NOTE — Anesthesia Postprocedure Evaluation (Signed)
Anesthesia Post Note  Patient: Chad Yu  Procedure(s) Performed: CYSTOSCOPY WITH RETROGRADE PYELOGRAM/URETERAL STENT PLACEMENT (Left )     Patient location during evaluation: PACU Anesthesia Type: General Level of consciousness: awake and alert and oriented Pain management: pain level controlled Vital Signs Assessment: post-procedure vital signs reviewed and stable Respiratory status: spontaneous breathing, nonlabored ventilation and respiratory function stable Cardiovascular status: blood pressure returned to baseline and stable Postop Assessment: no apparent nausea or vomiting Anesthetic complications: no    Last Vitals:  Vitals:   04/23/19 1600 04/23/19 1614  BP:  112/60  Pulse:  72  Resp:  16  Temp: 36.7 C 36.8 C  SpO2:  93%    Last Pain:  Vitals:   04/23/19 1614  TempSrc: Oral  PainSc:                  Janvi Ammar A.

## 2019-04-23 NOTE — Anesthesia Preprocedure Evaluation (Signed)
Anesthesia Evaluation  Patient identified by MRN, date of birth, ID band Patient awake    Reviewed: Allergy & Precautions, NPO status , Patient's Chart, lab work & pertinent test results  Airway Mallampati: II  TM Distance: >3 FB Neck ROM: Full    Dental no notable dental hx.    Pulmonary neg pulmonary ROS,    Pulmonary exam normal breath sounds clear to auscultation       Cardiovascular hypertension, + CAD, + Past MI and + Cardiac Stents  Normal cardiovascular exam Rhythm:Regular Rate:Normal     Neuro/Psych negative neurological ROS  negative psych ROS   GI/Hepatic negative GI ROS, Neg liver ROS,   Endo/Other  negative endocrine ROSdiabetes  Renal/GU negative Renal ROS  negative genitourinary   Musculoskeletal negative musculoskeletal ROS (+)   Abdominal   Peds negative pediatric ROS (+)  Hematology negative hematology ROS (+)   Anesthesia Other Findings   Reproductive/Obstetrics negative OB ROS                             Anesthesia Physical Anesthesia Plan  ASA: III  Anesthesia Plan: General   Post-op Pain Management:    Induction: Intravenous  PONV Risk Score and Plan: 2 and Ondansetron and Treatment may vary due to age or medical condition  Airway Management Planned: LMA  Additional Equipment:   Intra-op Plan:   Post-operative Plan: Extubation in OR  Informed Consent: I have reviewed the patients History and Physical, chart, labs and discussed the procedure including the risks, benefits and alternatives for the proposed anesthesia with the patient or authorized representative who has indicated his/her understanding and acceptance.     Dental advisory given  Plan Discussed with: CRNA and Surgeon  Anesthesia Plan Comments:         Anesthesia Quick Evaluation

## 2019-04-23 NOTE — Interval H&P Note (Signed)
History and Physical Interval Note:  04/23/2019 2:45 PM  Chad Yu  has presented today for surgery, with the diagnosis of left ureteral stone.  The various methods of treatment have been discussed with the patient and family. After consideration of risks, benefits and other options for treatment, the patient has consented to  Procedure(s): [place] (Left) as a surgical intervention.  The patient's history has been reviewed, patient examined, no change in status, stable for surgery.  I have reviewed the patient's chart and labs.  Questions were answered to the patient's satisfaction.     Ardis Hughs

## 2019-04-23 NOTE — Transfer of Care (Signed)
Immediate Anesthesia Transfer of Care Note  Patient: Chad Yu  Procedure(s) Performed: CYSTOSCOPY WITH RETROGRADE PYELOGRAM/URETERAL STENT PLACEMENT (Left )  Patient Location: PACU  Anesthesia Type:General  Level of Consciousness: awake, alert  and oriented  Airway & Oxygen Therapy: Patient Spontanous Breathing and Patient connected to face mask oxygen  Post-op Assessment: Report given to RN and Post -op Vital signs reviewed and stable  Post vital signs: Reviewed and stable  Last Vitals:  Vitals Value Taken Time  BP 105/60 04/23/19 1525  Temp    Pulse 71 04/23/19 1526  Resp    SpO2 99 % 04/23/19 1526  Vitals shown include unvalidated device data.  Last Pain:  Vitals:   04/23/19 1349  TempSrc:   PainSc: 7          Complications: No apparent anesthesia complications

## 2019-04-23 NOTE — Progress Notes (Signed)
PROGRESS NOTE  Chad Yu [id number] DOB: 12-Oct-1950 DOA: 04/20/2019 PCP: [name], MD  HPI/Recap of past 24 hours: Chad Yu is a 69 y.o. male with PMH of CKDIIIB, anemia of chronic disease, CAD s/p PCI, HTN, HLD, bladder cancer currently on palliative chemo,  new left hydroureteronephrosis seen on CT PET scan done in February 2021 which showed progression of his cancer, T2DM presented to [facility] ED with chest pain which resolved but admitted for AKI on CKDIIIB.  Reports generalized weakness and poor oral intake for the past 1-2 weeks.  Significantly dehydrated on presentation with AKI and hyperkalemia.  TRH asked to admit.  Urology consulted due to left hydronephrosis.  Planned stent placement this afternoon.    04/23/19: Was seen and examined this morning.  Reported nausea without vomiting.     Assessment/Plan: Principal Problem:   Acute renal failure superimposed on stage 3b chronic kidney disease (HCC) Active Problems:   Type 2 diabetes with nephropathy (HCC)   Hyperlipidemia   Essential hypertension   CAD, NATIVE VESSEL   GERD   CHEST PAIN   Bladder cancer ([place])   Renal insufficiency   AKI (acute kidney injury) ([place])   Anemia associated with chemotherapy   Hyponatremia  AKI on CKD IIIB suspect a combination of prerenal and postrenal from dehydration with poor oral intake and obstruction on this left ureter from his advancing cancer Presented with cr 4.04, proteinuria, moderate hemoglobin on UA and hypovolema in the setting of poor oral intake. He has obstruction of the distal left ureter from advancing cancer.  Seen on PET scan done in March 11 2019. Renal ultrasound showed mild to moderate left-sided hydronephrosis and bilateral nephroliths Plan for stent placement by urology this afternoon. Baseline cr 1.88 with GFR 38 Creatinine 4.0 from 4.31 Continue to avoid nephrotoxins, closely monitor urine output Continue daily BMPs  Acute blood loss, likely  multifactorial in the setting of hematuria on UA, CKD and malignancy Drop in hemoglobin 4/5 down to 6.9, transfused 1 unit PRBC 4/5 Hemoglobin 8.6 post 1 unit PRBC transfusion  Hyperkalemia, likely secondary to worsening renal function Potassium 5.2 from 5.3 on presentation Start Lokelma 5 mg twice daily x3 days. Repeat BMP in the morning  Insulin-dependent diabetes with hyperglycemia and nephropathy -A1c 7.4 in Jan 2021 -On insulin sliding scale, Lantus and NovoLog. -Avoid hypoglycemia  Mild to moderate L hydronephrosis/bilateral nephroliths Minimal residual from bladder scan on 4/4 Urology directing Plan for stent placement this afternoon  Splenomegaly, incidentally found Incidental findings Surveillance Avoid abdominal trauma and contact sports  Pseudohyponatremia Management per above  Dehydration from poor oral intake Continue gentle IV fluid Encourage oral intake IV anti emetics PRN  Essential HTN BP is at goal C/ current cardiac meds  Intermittent nausea in the setting of malignancy Continue as needed antiemetics  Severe protein calorie malnutrition Albumin 2.5 Muscle mass loss Continue oral supplement Encourage increase protein calorie intake  Abnormal LFTs LFTs are trending down Possibly related to chemo  Resolved Chest Pain CAD -chest pain absent on admission -Trop WNL -Cont aspirin, Plavix, beta-blocker, adjusted dose of nitrates and as needed nitroglycerin -No ACE inhibitors/ARB in the setting of acute renal insufficiency on chronic kidney disease currently  Bladder cancer currently undergoing palliative chemotherapy 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 Okay with NIV, pressors, ICU-level care.  DVT Prophylaxis:  Heparin 5000 units 3 times daily Family Communication:  Updated his son at bedside.  Disposition Plan: Patient is from home.  Anticipate dc to home with HHS in 48-72 hours once AKI has improved or urology has signed off.     Objective: Vitals:   04/23/19 1530 04/23/19 1545 04/23/19 1600 04/23/19 1614  BP: 107/61 (!) 106/56  112/60  Pulse: 69 69  72  Resp: 18 19  16   Temp:   98.1 F (36.7 C) 98.2 F (36.8 C)  TempSrc:    Oral  SpO2: 99% 91%  93%  Weight:      Height:        Intake/Output Summary (Last 24 hours) at 04/23/2019 1700 Last data filed at 04/23/2019 1600 Gross per 24 hour  Intake 500 ml  Output 300 ml  Net 200 ml   Filed Weights   04/20/19 1958 04/21/19 0126 04/23/19 1349  Weight: 103 kg 96.2 kg 96.2 kg    Exam:  . General: 69 y.o. year-old male well-developed nourished no acute distress.  Alert and oriented x3. . Cardiovascular: Regular rate and rhythm no rubs or gallops.   Marland Kitchen Respiratory: Clear to auscultation no wheezes or rales.   . Abdomen: Soft obese nontender normal bowel sounds present.   . Musculoskeletal: No lower extremity edema bilaterally.   Marland Kitchen Psychiatry: Mood is appropriate for condition and setting.   Data Reviewed: CBC: Recent Labs  Lab 04/20/19 2045 04/21/19 0520 04/22/19 0538 04/23/19 0548  WBC 10.0 8.3 8.0 8.5  NEUTROABS 8.7*  --   --   --   HGB 8.1* 7.6* 6.9* 8.6*  HCT 26.3* 24.5* 22.0* 28.4*  MCV 96.7 95.0 97.3 95.9  PLT 225 216 190 [id number]   Basic Metabolic Panel: Recent Labs  Lab 04/20/19 2045 04/20/19 2335 04/21/19 0520 04/21/19 0619 04/21/19 1533 04/22/19 0538 04/23/19 0548  NA 130*  --  133*  --   --  132* 133*  K 5.4*  --  5.3*  --  5.1 4.6 5.2*  CL 100  --  103  --   --  103 104  CO2 19*  --  21*  --   --  19* 18*  GLUCOSE 310*  --  194*  --   --  102* 111*  BUN 71*  --  69*  --    --  74* 75*  CREATININE 4.04*  --  4.09*  --   --  4.31* 4.00*  CALCIUM 8.6*  --  8.4*  --   --  8.2* 8.5*  MG  --  2.2  --   --   --  2.1  --   PHOS  --   --   --  4.6  --  4.6  --    GFR: Estimated Creatinine Clearance: 21 mL/min (A) (by C-G formula based on SCr of 4 mg/dL (H)). Liver Function Tests: Recent Labs  Lab 04/21/19 0520 04/23/19 0548  AST 40 28  ALT 59* 38  ALKPHOS 314* 226*  BILITOT 0.8 1.1  PROT 6.0* 5.8*  ALBUMIN 2.5* 2.4*   Recent Labs  Lab 04/23/19 0548  LIPASE 16   No results for input(s): AMMONIA in the last 168 hours. Coagulation Profile: Recent Labs  Lab 04/20/19 2045  INR 1.4*   Cardiac Enzymes: No  results for input(s): CKTOTAL, CKMB, CKMBINDEX, TROPONINI in the last 168 hours. BNP (last 3 results) No results for input(s): PROBNP in the last 8760 hours. HbA1C: Recent Labs    04/22/19 0536  HGBA1C 7.4*   CBG: Recent Labs  Lab 04/22/19 2243 04/23/19 0738 04/23/19 1149 04/23/19 1347 04/23/19 1539  GLUCAP 76 115* 113* 119* 105*   Lipid Profile: No results for input(s): CHOL, HDL, LDLCALC, TRIG, CHOLHDL, LDLDIRECT in the last 72 hours. Thyroid Function Tests: No results for input(s): TSH, T4TOTAL, FREET4, T3FREE, THYROIDAB in the last 72 hours. Anemia Panel: Recent Labs    04/21/19 0520  FERRITIN 3,072*  TIBC 174*  IRON 21*   Urine analysis:    Component Value Date/Time   COLORURINE YELLOW 04/20/2019 [place] 04/20/2019 2345   LABSPEC 1.015 04/20/2019 2345   PHURINE 5.0 04/20/2019 2345   GLUCOSEU NEGATIVE 04/20/2019 2345   HGBUR MODERATE (A) 04/20/2019 2345   BILIRUBINUR NEGATIVE 04/20/2019 2345   KETONESUR NEGATIVE 04/20/2019 2345   PROTEINUR 30 (A) 04/20/2019 2345   UROBILINOGEN 0.2 11/26/2008 0947   NITRITE NEGATIVE 04/20/2019 2345   LEUKOCYTESUR NEGATIVE 04/20/2019 2345   Sepsis Labs: @LABRCNTIP (procalcitonin:4,lacticidven:4)  ) Recent Results (from the past 240 hour(s))  Respiratory Panel by  RT PCR (Flu A&B, Covid) - Nasopharyngeal Swab     Status: None   Collection Time: 04/20/19 11:50 PM   Specimen: Nasopharyngeal Swab  Result Value Ref Range Status   SARS Coronavirus 2 by RT PCR NEGATIVE NEGATIVE Final    Comment: (NOTE) SARS-CoV-2 target nucleic acids are NOT DETECTED. The SARS-CoV-2 RNA is generally detectable in upper respiratoy specimens during the acute phase of infection. The lowest concentration of SARS-CoV-2 viral copies this assay can detect is 131 copies/mL. A negative result does not preclude SARS-Cov-2 infection and should not be used as the sole basis for treatment or other patient management decisions. A negative result may occur with  improper specimen collection/handling, submission of specimen other than nasopharyngeal swab, presence of viral mutation(s) within the areas targeted by this assay, and inadequate number of viral copies (<131 copies/mL). A negative result must be combined with clinical observations, patient history, and epidemiological information. The expected result is Negative. Fact Sheet for Patients:  PinkCheek.be Fact Sheet for Healthcare Providers:  GravelBags.it This test is not yet ap proved or cleared by the [place] FDA and  has been authorized for detection and/or diagnosis of SARS-CoV-2 by FDA under an Emergency Use Authorization (EUA). This EUA will remain  in effect (meaning this test can be used) for the duration of the COVID-19 declaration under Section 564(b)(1) of the Act, 21 U.S.C. section 360bbb-3(b)(1), unless the authorization is terminated or revoked sooner.    Influenza A by PCR NEGATIVE NEGATIVE Final   Influenza B by PCR NEGATIVE NEGATIVE Final    Comment: (NOTE) The Xpert Xpress SARS-CoV-2/FLU/RSV assay is intended as an aid in  the diagnosis of influenza from Nasopharyngeal swab specimens and  should not be used as a sole basis for treatment.  Nasal washings and  aspirates are unacceptable for Xpert Xpress SARS-CoV-2/FLU/RSV  testing. Fact Sheet for Patients: PinkCheek.be Fact Sheet for Healthcare Providers: GravelBags.it This test is not yet approved or cleared by the [place] FDA and  has been authorized for detection and/or diagnosis of SARS-CoV-2 by  FDA under an Emergency Use Authorization (EUA). This EUA will remain  in effect (meaning this test can be used) for the duration of the  Covid-19  declaration under Section 564(b)(1) of the Act, 21  U.S.C. section 360bbb-3(b)(1), unless the authorization is  terminated or revoked. Performed at [facility], [place] [address] [place], [place] [address]       Studies: DG C-Arm 1-60 Min-No Report  Result Date: 04/23/2019 Fluoroscopy was utilized by the requesting physician.  No radiographic interpretation.    Scheduled Meds: . amLODipine  5 mg Oral Daily  . aspirin EC  81 mg Oral Daily  . clopidogrel  75 mg Oral Daily  . Dulaglutide  1.5 mg Subcutaneous Q Sun  . heparin  5,000 Units Subcutaneous Q8H  . icosapent Ethyl  2 g Oral BID  . insulin aspart  0-9 Units Subcutaneous TID WC  . insulin aspart  5 Units Subcutaneous BID WC  . insulin glargine  10 Units Subcutaneous QHS  . isosorbide mononitrate  120 mg Oral Daily  . metoprolol tartrate  50 mg Oral BID  . pantoprazole  20 mg Oral Daily  . Ensure Max Protein  11 oz Oral Daily  . sodium zirconium cyclosilicate  5 g Oral TID    Continuous Infusions: . sodium chloride 75 mL/hr at 04/23/19 1129  . ceFAZolin       LOS: 3 days     Kayleen Memos, MD [facility] Pager [phone]  If 7PM-7AM, please contact night-coverage [email or web address] Password [email or web address] 04/23/2019, 5:00 PM

## 2019-04-23 NOTE — Consult Note (Signed)
I have been asked to see the patient by Dr. Francia Greaves, for evaluation and management of left hydro.  History of present illness: 69 year old male with a history of advanced bladder cancer currently undergoing palliative chemotherapy.  The patient had a CT PET scan performed in February that showed progression of his cancer with new left hydroureteronephrosis.  The patient was then transitioned to second line therapy.  He was encouraged to follow-up with urology given the new findings, but the patient has thus far been unable to do so.  The patient was admitted through the emergency department because of severe nausea, vomiting, and failure to thrive.  He is complaining of some left sided flank pain.  An ultrasound performed in the emergency department demonstrated some left hydronephrosis.  The patient was also noted to have acute renal insufficiency, considered likely to be combination of both dehydration and obstruction.  Urology was consulted for further evaluation and management.  Review of systems: A 12 point comprehensive review of systems was obtained and is negative unless otherwise stated in the history of present illness.  Patient Active Problem List   Diagnosis Date Noted  . AKI (acute kidney injury) ([place]) 04/20/2019  . Anemia associated with chemotherapy 04/20/2019  . Hyponatremia 04/20/2019  . Acute systolic HF (heart failure) ([place])   . Acute renal failure superimposed on stage 3b chronic kidney disease ([place])   . Symptomatic anemia 02/04/2019  . Thrombocytopenia ([place]) 02/04/2019  . Renal insufficiency 02/04/2019  . NSTEMI (non-ST elevated myocardial infarction) ([place]) 02/03/2019  . Statin myopathy 01/10/2019  . Unstable angina ([place]) 11/23/2018  . Chest pain 11/22/2018  . Goals of care, counseling/discussion 09/19/2018  . Bladder cancer ([place]) 09/19/2018  . Primary localized osteoarthritis of left knee 02/17/2016  . Bradycardia 12/16/2010  . DIZZINESS 04/27/2009  . CHEST PAIN  04/27/2009  . ABNORMAL ELECTROCARDIOGRAM 04/27/2009  . ABDOMINAL PAIN-RUQ 10/30/2008  . GASTRITIS 06/13/2008  . GERD 06/05/2008  . SWELLING MASS OR LUMP IN HEAD AND NECK 06/05/2008  . COUGH 06/05/2008  . HEMORRHOIDS 06/04/2008  . CAD, NATIVE VESSEL 04/09/2008  . Type 2 diabetes with nephropathy ([place]) 04/07/2008  . Hyperlipidemia 04/07/2008  . Essential hypertension 04/07/2008  . FATIGUE / MALAISE 04/07/2008  . DYSPNEA 04/07/2008    No current facility-administered medications on file prior to encounter.   Current Outpatient Medications on File Prior to Encounter  Medication Sig Dispense Refill  . amLODipine (NORVASC) 5 MG tablet Take 1 tablet (5 mg total) by mouth daily. 90 tablet 3  . aspirin EC 81 MG EC tablet Take 1 tablet (81 mg total) by mouth daily.    . clopidogrel (PLAVIX) 75 MG tablet Take 1 tablet (75 mg total) by mouth daily. 90 tablet 2  . diclofenac sodium (VOLTAREN) 1 % GEL Apply 1 application topically at bedtime as needed (pain.).    Marland Kitchen diphenoxylate-atropine (LOMOTIL) 2.5-0.025 MG tablet 1 to 2 tablets PO QID prn diarrhea or stomach cramping 40 tablet 1  . Dulaglutide (TRULICITY) 1.5 0000000 SOPN Inject 1.5 mg into the skin every Sunday.     . empagliflozin (JARDIANCE) 25 MG TABS tablet Take 25 mg by mouth daily.    . Evolocumab (REPATHA SURECLICK) [id number] MG/ML SOAJ Inject 1 pen into the skin every 14 (fourteen) days. 2 pen 11  . Homeopathic Products (THERAWORX GLOVE + FOAM EX) Apply 1 application topically 2 (two) times daily as needed (leg cramps).    Marland Kitchen icosapent Ethyl (VASCEPA) 1 g capsule Take 2 capsules (2 g  total) by mouth 2 (two) times daily. 120 capsule 5  . insulin glargine (LANTUS) 100 UNIT/ML injection Inject 50 Units into the skin at bedtime.     . insulin lispro (HUMALOG) 100 UNIT/ML KwikPen Inject 15 Units into the skin 2 (two) times daily.    . isosorbide mononitrate (IMDUR) 120 MG 24 hr tablet Take 1 tablet (120 mg total) by mouth daily. 30 tablet 1  .  metoprolol tartrate (LOPRESSOR) 50 MG tablet Take 1 tablet (50 mg total) by mouth 2 (two) times daily. 180 tablet 3  . Multiple Vitamins-Minerals (MULTIVITAMIN WITH MINERALS) tablet Take 1 tablet by mouth daily.    . nitroGLYCERIN (NITROSTAT) 0.4 MG SL tablet Place 1 tablet (0.4 mg total) under the tongue every 5 (five) minutes as needed for chest pain. 90 tablet 3  . ondansetron (ZOFRAN ODT) 4 MG disintegrating tablet Take 1 tablet (4 mg total) by mouth every 8 (eight) hours as needed for nausea or vomiting. 40 tablet 2  . pantoprazole (PROTONIX) 20 MG tablet Take 1 tablet (20 mg total) by mouth daily. 30 tablet 5  . Potassium Citrate 15 MEQ (1620 MG) TBCR Take 1 tablet by mouth 2 (two) times daily.    Marland Kitchen tetrahydrozoline 0.05 % ophthalmic solution Place 1 drop into both eyes at bedtime as needed (eye irritation).    . [DISCONTINUED] fenofibrate (TRICOR) 145 MG tablet Take 145 mg by mouth daily.        Past Medical History:  Diagnosis Date  . Bladder tumor   . Cancer (HCC)    BLADDER CANCER  . Coronary artery disease cardiologist-- dr [name] (per pt last office visit 10/ 2018)   s/p  PCI w/ DES x1 to diagonal branch of LAD  . DDD (degenerative disc disease), lumbar   . Degenerative joint disease   . History of concussion    per pt age 71 w/ brief loc--- no residual  . Hyperlipidemia   . Hypertension   . OA (osteoarthritis)   . S/P drug eluting coronary stent placement 10/24/2007   diagonal branch of LAD  . Type 2 diabetes mellitus treated with insulin ([place])    followed by pcp  . Varicose vein of leg   . Wears glasses     Past Surgical History:  Procedure Laterality Date  . APPENDECTOMY  age 11  . COLONOSCOPY  2012  . CORONARY ANGIOPLASTY  08-26-2010   dr [name]   previous stent patent,  proximal prior to previous stent 99% stenosis;  Unsuccessful multiple attempts PCI, unability to cross with a ballon;  medical management  . CORONARY ANGIOPLASTY WITH STENT PLACEMENT   10-23-2017  dr Darnell Level brodie   positive ischemia on myoview;   40% pLAD and D1 80%,  diagonal branch treated w/ PCI and DES   . EXCISION MIDLINE SUPERIOR THYOID ISTHMUS MASS  11-27-2008   dr Ernesto Rutherford   bening lipoma  . KNEE ARTHROSCOPY Left 01-22-2004   dr Percell Miller  . POSTERIOR LUMBAR FUSION  11-14-2012   dr Christella Noa @[facility]    L3 -- 5  . SHOULDER ARTHROSCOPY Right 2019  . SHOULDER ARTHROSCOPY WITH ROTATOR CUFF REPAIR AND SUBACROMIAL DECOMPRESSION Left 11-12-2009   dr Percell Miller  @[facility]    w/ Labral debridement and DCR  . TONSILLECTOMY  age 39  . TOTAL KNEE ARTHROPLASTY Left 02/17/2016   Procedure: TOTAL KNEE ARTHROPLASTY;  Surgeon: Ninetta Lights, MD;  Location: [facility];  Service: Orthopedics;  Laterality: Left;  . TOTAL KNEE ARTHROPLASTY Right 02-25-2004  dr Percell Miller  @[facility]   . TRANSURETHRAL RESECTION OF BLADDER TUMOR N/A 08/31/2018   Procedure: TRANSURETHRAL RESECTION OF BLADDER TUMOR (TURBT) WITH INSTILLATION OF POST OPERATIVE CHEMOTHERAPY;  Surgeon: Kathie Rhodes, MD;  Location: [facility];  Service: Urology;  Laterality: N/A;  . VARICOSE VEIN SURGERY  1995    Social History   Tobacco Use  . Smoking status: Never Smoker  . Smokeless tobacco: Never Used  Substance Use Topics  . Alcohol use: No  . Drug use: No    Family History  Problem Relation Age of Onset  . Heart attack Father   . Hypertension Father   . Heart attack Brother     PE: Vitals:   04/22/19 1145 04/22/19 2124 04/22/19 2244 04/23/19 0521  BP: 119/72 131/69 122/68 (!) 109/57  Pulse: 67 80 80 77  Resp:  20 18 20   Temp: 98.1 F (36.7 C) 97.9 F (36.6 C) 99.4 F (37.4 C) 97.6 F (36.4 C)  TempSrc: Oral Oral Oral Oral  SpO2: 93% 96% 95% 93%  Weight:      Height:       Patient appears to be in no acute distress  patient is alert and oriented x3 Atraumatic normocephalic head No cervical or supraclavicular lymphadenopathy appreciated No increased work of breathing, no audible wheezes/rhonchi Regular sinus rhythm/rate Abdomen  is soft, nontender, nondistended,   no suprapubic tenderness Mild left CVA tenderness Lower extremities are symmetric without appreciable edema Grossly neurologically intact No identifiable skin lesions  Recent Labs    04/21/19 0520 04/22/19 0538 04/23/19 0548  WBC 8.3 8.0 8.5  HGB 7.6* 6.9* 8.6*  HCT 24.5* 22.0* 28.4*   Recent Labs    04/21/19 0520 04/21/19 0520 04/21/19 1533 04/22/19 0538 04/23/19 0548  NA 133*  --   --  132* 133*  K 5.3*   < > 5.1 4.6 5.2*  CL 103  --   --  103 104  CO2 21*  --   --  19* 18*  GLUCOSE 194*  --   --  102* 111*  BUN 69*  --   --  74* 75*  CREATININE 4.09*  --   --  4.31* 4.00*  CALCIUM 8.4*  --   --  8.2* 8.5*   < > = values in this interval not displayed.   Recent Labs    04/20/19 2045  INR 1.4*   No results for input(s): LABURIN in the last 72 hours. Results for orders placed or performed during the hospital encounter of 04/20/19  Respiratory Panel by RT PCR (Flu A&B, Covid) - Nasopharyngeal Swab     Status: None   Collection Time: 04/20/19 11:50 PM   Specimen: Nasopharyngeal Swab  Result Value Ref Range Status   SARS Coronavirus 2 by RT PCR NEGATIVE NEGATIVE Final    Comment: (NOTE) SARS-CoV-2 target nucleic acids are NOT DETECTED. The SARS-CoV-2 RNA is generally detectable in upper respiratoy specimens during the acute phase of infection. The lowest concentration of SARS-CoV-2 viral copies this assay can detect is 131 copies/mL. A negative result does not preclude SARS-Cov-2 infection and should not be used as the sole basis for treatment or other patient management decisions. A negative result may occur with  improper specimen collection/handling, submission of specimen other than nasopharyngeal swab, presence of viral mutation(s) within the areas targeted by this assay, and inadequate number of viral copies (<131 copies/mL). A negative result must be combined with clinical observations, patient history, and  epidemiological information. The expected result  is Negative. Fact Sheet for Patients:  PinkCheek.be Fact Sheet for Healthcare Providers:  GravelBags.it This test is not yet ap proved or cleared by the [place] FDA and  has been authorized for detection and/or diagnosis of SARS-CoV-2 by FDA under an Emergency Use Authorization (EUA). This EUA will remain  in effect (meaning this test can be used) for the duration of the COVID-19 declaration under Section 564(b)(1) of the Act, 21 U.S.C. section 360bbb-3(b)(1), unless the authorization is terminated or revoked sooner.    Influenza A by PCR NEGATIVE NEGATIVE Final   Influenza B by PCR NEGATIVE NEGATIVE Final    Comment: (NOTE) The Xpert Xpress SARS-CoV-2/FLU/RSV assay is intended as an aid in  the diagnosis of influenza from Nasopharyngeal swab specimens and  should not be used as a sole basis for treatment. Nasal washings and  aspirates are unacceptable for Xpert Xpress SARS-CoV-2/FLU/RSV  testing. Fact Sheet for Patients: PinkCheek.be Fact Sheet for Healthcare Providers: GravelBags.it This test is not yet approved or cleared by the [place] FDA and  has been authorized for detection and/or diagnosis of SARS-CoV-2 by  FDA under an Emergency Use Authorization (EUA). This EUA will remain  in effect (meaning this test can be used) for the duration of the  Covid-19 declaration under Section 564(b)(1) of the Act, 21  U.S.C. section 360bbb-3(b)(1), unless the authorization is  terminated or revoked. Performed at [facility], [place] [address], [place], [place] [address]     Imaging: I reviewed the patient's renal ultrasound as well as a PET CT scan from 6 weeks prior demonstrating left hydronephrosis as well as evidence of progression of his transitional cell carcinoma of the bladder.  Imp: The  patient has obstruction of his left ureter from advancing cancer.  This is likely contributing in some part to his kidney insufficiency, his pain and possibly even the nausea/vomiting.  Recommendations: The patient would certainly benefit from attempt at placing a stent.  This may be challenging given the cancer is on the left lateral sidewall, which may obstruct the ureteral orifice and make it difficult to see.  He understands that if I am unable to place a stent in the retrograde fashion that a nephrostomy tube would be the next step.  However, in order to maximize kidney function and thus his treatment options for chemotherapy decompressing the kidney is necessary.  I discussed stent placement with the patient in detail, and he understands the procedure.  We discussed the possible risk and benefits of the operation and he has opted to proceed.  We will make him n.p.o. and schedule him for this afternoon.  Ardis Hughs

## 2019-04-23 NOTE — Op Note (Signed)
Preoperative diagnosis:    Left ureteral obstruction from metastatic bladder cancer  acute renal failure   Postoperative diagnosis:   same   Procedure:  Cystoscopy left ureteral stent placement left retrograde pyelography with interpretation   Surgeon: Ardis Hughs, MD  Anesthesia: General  Complications: None  Intraoperative findings:      1.:Cystoscopy demonstrated recurrent tumor on the patient's left lateral wall involving the left trigone.  There was new bladder tumor as well as all necrotic tumor.  The ureteral orifice was patent.    2. The retrograde pyelogram demonstrated severe hydroureteronephrosis with a torturous ureter all the way down to the UVJ, where there was a narrowing consistent with the patient's extravesical disease.   3.  A  28 French x6 [place] double-J ureteral stent was placed in the patient's left ureter.  EBL: Minimal  Specimens: None  Indication: Chad Yu is a 69 y.o. patient with metastatic bladder cancer with left hydronephrosis. After reviewing the management options for treatment, he elected to proceed with the above surgical procedure(s). We have discussed the potential benefits and risks of the procedure, side effects of the proposed treatment, the likelihood of the patient achieving the goals of the procedure, and any potential problems that might occur during the procedure or recuperation. Informed consent has been obtained.  Description of procedure:  The patient was taken to the operating room and general anesthesia was induced.  The patient was placed in the dorsal lithotomy position, prepped and draped in the usual sterile fashion, and preoperative antibiotics were administered. A preoperative time-out was performed.   Cystourethroscopy was performed.  The patient's urethra was examined and was normal  demonstrated bilobar prostatic hypertrophy. The bladder was then systematically examined in its entirety. The above findings  were noted.  The left ureteral orfice was patent and easily cannulated.  Attention then turned to the leftureteral orifice and a ureteral catheter was used to intubate the ureteral orifice.  Omnipaque contrast was injected through the ureteral catheter and a retrograde pyelogram was performed with findings as dictated above.  A 0.38 sensor guidewire was then advanced up the left ureter into the renal pelvis under fluoroscopic guidance.  The wire was then backloaded through the cystoscope and a ureteral stent was advance over the wire using Seldinger technique.  The stent was positioned appropriately under fluoroscopic and cystoscopic guidance.  The wire was then removed with an adequate stent curl noted in the renal pelvis as well as in the bladder.  The bladder was then emptied and the procedure ended.  The patient appeared to tolerate the procedure well and without complications.  The patient was able to be awakened and transferred to the recovery unit in satisfactory condition.    Ardis Hughs, M.D.

## 2019-04-23 NOTE — H&P (View-Only) (Signed)
I have been asked to see the patient by Dr. Francia Greaves, for evaluation and management of left hydro.  History of present illness: 69 year old male with a history of advanced bladder cancer currently undergoing palliative chemotherapy.  The patient had a CT PET scan performed in February that showed progression of his cancer with new left hydroureteronephrosis.  The patient was then transitioned to second line therapy.  He was encouraged to follow-up with urology given the new findings, but the patient has thus far been unable to do so.  The patient was admitted through the emergency department because of severe nausea, vomiting, and failure to thrive.  He is complaining of some left sided flank pain.  An ultrasound performed in the emergency department demonstrated some left hydronephrosis.  The patient was also noted to have acute renal insufficiency, considered likely to be combination of both dehydration and obstruction.  Urology was consulted for further evaluation and management.  Review of systems: A 12 point comprehensive review of systems was obtained and is negative unless otherwise stated in the history of present illness.  Patient Active Problem List   Diagnosis Date Noted  . AKI (acute kidney injury) ([place]) 04/20/2019  . Anemia associated with chemotherapy 04/20/2019  . Hyponatremia 04/20/2019  . Acute systolic HF (heart failure) ([place])   . Acute renal failure superimposed on stage 3b chronic kidney disease ([place])   . Symptomatic anemia 02/04/2019  . Thrombocytopenia ([place]) 02/04/2019  . Renal insufficiency 02/04/2019  . NSTEMI (non-ST elevated myocardial infarction) ([place]) 02/03/2019  . Statin myopathy 01/10/2019  . Unstable angina ([place]) 11/23/2018  . Chest pain 11/22/2018  . Goals of care, counseling/discussion 09/19/2018  . Bladder cancer ([place]) 09/19/2018  . Primary localized osteoarthritis of left knee 02/17/2016  . Bradycardia 12/16/2010  . DIZZINESS 04/27/2009  . CHEST PAIN  04/27/2009  . ABNORMAL ELECTROCARDIOGRAM 04/27/2009  . ABDOMINAL PAIN-RUQ 10/30/2008  . GASTRITIS 06/13/2008  . GERD 06/05/2008  . SWELLING MASS OR LUMP IN HEAD AND NECK 06/05/2008  . COUGH 06/05/2008  . HEMORRHOIDS 06/04/2008  . CAD, NATIVE VESSEL 04/09/2008  . Type 2 diabetes with nephropathy ([place]) 04/07/2008  . Hyperlipidemia 04/07/2008  . Essential hypertension 04/07/2008  . FATIGUE / MALAISE 04/07/2008  . DYSPNEA 04/07/2008    No current facility-administered medications on file prior to encounter.   Current Outpatient Medications on File Prior to Encounter  Medication Sig Dispense Refill  . amLODipine (NORVASC) 5 MG tablet Take 1 tablet (5 mg total) by mouth daily. 90 tablet 3  . aspirin EC 81 MG EC tablet Take 1 tablet (81 mg total) by mouth daily.    . clopidogrel (PLAVIX) 75 MG tablet Take 1 tablet (75 mg total) by mouth daily. 90 tablet 2  . diclofenac sodium (VOLTAREN) 1 % GEL Apply 1 application topically at bedtime as needed (pain.).    Marland Kitchen diphenoxylate-atropine (LOMOTIL) 2.5-0.025 MG tablet 1 to 2 tablets PO QID prn diarrhea or stomach cramping 40 tablet 1  . Dulaglutide (TRULICITY) 1.5 0000000 SOPN Inject 1.5 mg into the skin every Sunday.     . empagliflozin (JARDIANCE) 25 MG TABS tablet Take 25 mg by mouth daily.    . Evolocumab (REPATHA SURECLICK) [id number] MG/ML SOAJ Inject 1 pen into the skin every 14 (fourteen) days. 2 pen 11  . Homeopathic Products (THERAWORX GLOVE + FOAM EX) Apply 1 application topically 2 (two) times daily as needed (leg cramps).    Marland Kitchen icosapent Ethyl (VASCEPA) 1 g capsule Take 2 capsules (2 g  total) by mouth 2 (two) times daily. 120 capsule 5  . insulin glargine (LANTUS) 100 UNIT/ML injection Inject 50 Units into the skin at bedtime.     . insulin lispro (HUMALOG) 100 UNIT/ML KwikPen Inject 15 Units into the skin 2 (two) times daily.    . isosorbide mononitrate (IMDUR) 120 MG 24 hr tablet Take 1 tablet (120 mg total) by mouth daily. 30 tablet 1  .  metoprolol tartrate (LOPRESSOR) 50 MG tablet Take 1 tablet (50 mg total) by mouth 2 (two) times daily. 180 tablet 3  . Multiple Vitamins-Minerals (MULTIVITAMIN WITH MINERALS) tablet Take 1 tablet by mouth daily.    . nitroGLYCERIN (NITROSTAT) 0.4 MG SL tablet Place 1 tablet (0.4 mg total) under the tongue every 5 (five) minutes as needed for chest pain. 90 tablet 3  . ondansetron (ZOFRAN ODT) 4 MG disintegrating tablet Take 1 tablet (4 mg total) by mouth every 8 (eight) hours as needed for nausea or vomiting. 40 tablet 2  . pantoprazole (PROTONIX) 20 MG tablet Take 1 tablet (20 mg total) by mouth daily. 30 tablet 5  . Potassium Citrate 15 MEQ (1620 MG) TBCR Take 1 tablet by mouth 2 (two) times daily.    Marland Kitchen tetrahydrozoline 0.05 % ophthalmic solution Place 1 drop into both eyes at bedtime as needed (eye irritation).    . [DISCONTINUED] fenofibrate (TRICOR) 145 MG tablet Take 145 mg by mouth daily.        Past Medical History:  Diagnosis Date  . Bladder tumor   . Cancer (HCC)    BLADDER CANCER  . Coronary artery disease cardiologist-- dr [name] (per pt last office visit 10/ 2018)   s/p  PCI w/ DES x1 to diagonal branch of LAD  . DDD (degenerative disc disease), lumbar   . Degenerative joint disease   . History of concussion    per pt age 54 w/ brief loc--- no residual  . Hyperlipidemia   . Hypertension   . OA (osteoarthritis)   . S/P drug eluting coronary stent placement 10/24/2007   diagonal branch of LAD  . Type 2 diabetes mellitus treated with insulin ([place])    followed by pcp  . Varicose vein of leg   . Wears glasses     Past Surgical History:  Procedure Laterality Date  . APPENDECTOMY  age 69  . COLONOSCOPY  2012  . CORONARY ANGIOPLASTY  08-26-2010   dr [name]   previous stent patent,  proximal prior to previous stent 99% stenosis;  Unsuccessful multiple attempts PCI, unability to cross with a ballon;  medical management  . CORONARY ANGIOPLASTY WITH STENT PLACEMENT   10-23-2017  dr Darnell Level brodie   positive ischemia on myoview;   40% pLAD and D1 80%,  diagonal branch treated w/ PCI and DES   . EXCISION MIDLINE SUPERIOR THYOID ISTHMUS MASS  11-27-2008   dr Ernesto Rutherford   bening lipoma  . KNEE ARTHROSCOPY Left 01-22-2004   dr Percell Miller  . POSTERIOR LUMBAR FUSION  11-14-2012   dr Christella Noa @[facility]    L3 -- 5  . SHOULDER ARTHROSCOPY Right 2019  . SHOULDER ARTHROSCOPY WITH ROTATOR CUFF REPAIR AND SUBACROMIAL DECOMPRESSION Left 11-12-2009   dr Percell Miller  @[facility]    w/ Labral debridement and DCR  . TONSILLECTOMY  age 31  . TOTAL KNEE ARTHROPLASTY Left 02/17/2016   Procedure: TOTAL KNEE ARTHROPLASTY;  Surgeon: Ninetta Lights, MD;  Location: [facility];  Service: Orthopedics;  Laterality: Left;  . TOTAL KNEE ARTHROPLASTY Right 02-25-2004  dr Percell Miller  @[facility]   . TRANSURETHRAL RESECTION OF BLADDER TUMOR N/A 08/31/2018   Procedure: TRANSURETHRAL RESECTION OF BLADDER TUMOR (TURBT) WITH INSTILLATION OF POST OPERATIVE CHEMOTHERAPY;  Surgeon: Kathie Rhodes, MD;  Location: [facility];  Service: Urology;  Laterality: N/A;  . VARICOSE VEIN SURGERY  1995    Social History   Tobacco Use  . Smoking status: Never Smoker  . Smokeless tobacco: Never Used  Substance Use Topics  . Alcohol use: No  . Drug use: No    Family History  Problem Relation Age of Onset  . Heart attack Father   . Hypertension Father   . Heart attack Brother     PE: Vitals:   04/22/19 1145 04/22/19 2124 04/22/19 2244 04/23/19 0521  BP: 119/72 131/69 122/68 (!) 109/57  Pulse: 67 80 80 77  Resp:  20 18 20   Temp: 98.1 F (36.7 C) 97.9 F (36.6 C) 99.4 F (37.4 C) 97.6 F (36.4 C)  TempSrc: Oral Oral Oral Oral  SpO2: 93% 96% 95% 93%  Weight:      Height:       Patient appears to be in no acute distress  patient is alert and oriented x3 Atraumatic normocephalic head No cervical or supraclavicular lymphadenopathy appreciated No increased work of breathing, no audible wheezes/rhonchi Regular sinus rhythm/rate Abdomen  is soft, nontender, nondistended,   no suprapubic tenderness Mild left CVA tenderness Lower extremities are symmetric without appreciable edema Grossly neurologically intact No identifiable skin lesions  Recent Labs    04/21/19 0520 04/22/19 0538 04/23/19 0548  WBC 8.3 8.0 8.5  HGB 7.6* 6.9* 8.6*  HCT 24.5* 22.0* 28.4*   Recent Labs    04/21/19 0520 04/21/19 0520 04/21/19 1533 04/22/19 0538 04/23/19 0548  NA 133*  --   --  132* 133*  K 5.3*   < > 5.1 4.6 5.2*  CL 103  --   --  103 104  CO2 21*  --   --  19* 18*  GLUCOSE 194*  --   --  102* 111*  BUN 69*  --   --  74* 75*  CREATININE 4.09*  --   --  4.31* 4.00*  CALCIUM 8.4*  --   --  8.2* 8.5*   < > = values in this interval not displayed.   Recent Labs    04/20/19 2045  INR 1.4*   No results for input(s): LABURIN in the last 72 hours. Results for orders placed or performed during the hospital encounter of 04/20/19  Respiratory Panel by RT PCR (Flu A&B, Covid) - Nasopharyngeal Swab     Status: None   Collection Time: 04/20/19 11:50 PM   Specimen: Nasopharyngeal Swab  Result Value Ref Range Status   SARS Coronavirus 2 by RT PCR NEGATIVE NEGATIVE Final    Comment: (NOTE) SARS-CoV-2 target nucleic acids are NOT DETECTED. The SARS-CoV-2 RNA is generally detectable in upper respiratoy specimens during the acute phase of infection. The lowest concentration of SARS-CoV-2 viral copies this assay can detect is 131 copies/mL. A negative result does not preclude SARS-Cov-2 infection and should not be used as the sole basis for treatment or other patient management decisions. A negative result may occur with  improper specimen collection/handling, submission of specimen other than nasopharyngeal swab, presence of viral mutation(s) within the areas targeted by this assay, and inadequate number of viral copies (<131 copies/mL). A negative result must be combined with clinical observations, patient history, and  epidemiological information. The expected result  is Negative. Fact Sheet for Patients:  PinkCheek.be Fact Sheet for Healthcare Providers:  GravelBags.it This test is not yet ap proved or cleared by the [place] FDA and  has been authorized for detection and/or diagnosis of SARS-CoV-2 by FDA under an Emergency Use Authorization (EUA). This EUA will remain  in effect (meaning this test can be used) for the duration of the COVID-19 declaration under Section 564(b)(1) of the Act, 21 U.S.C. section 360bbb-3(b)(1), unless the authorization is terminated or revoked sooner.    Influenza A by PCR NEGATIVE NEGATIVE Final   Influenza B by PCR NEGATIVE NEGATIVE Final    Comment: (NOTE) The Xpert Xpress SARS-CoV-2/FLU/RSV assay is intended as an aid in  the diagnosis of influenza from Nasopharyngeal swab specimens and  should not be used as a sole basis for treatment. Nasal washings and  aspirates are unacceptable for Xpert Xpress SARS-CoV-2/FLU/RSV  testing. Fact Sheet for Patients: PinkCheek.be Fact Sheet for Healthcare Providers: GravelBags.it This test is not yet approved or cleared by the [place] FDA and  has been authorized for detection and/or diagnosis of SARS-CoV-2 by  FDA under an Emergency Use Authorization (EUA). This EUA will remain  in effect (meaning this test can be used) for the duration of the  Covid-19 declaration under Section 564(b)(1) of the Act, 21  U.S.C. section 360bbb-3(b)(1), unless the authorization is  terminated or revoked. Performed at [facility], [place] 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  Imp: The  patient has obstruction of his left ureter from advancing cancer.  This is likely contributing in some part to his kidney insufficiency, his pain and possibly even the nausea/vomiting.  Recommendations: The patient would certainly benefit from attempt at placing a stent.  This may be challenging given the cancer is on the left lateral sidewall, which may obstruct the ureteral orifice and make it difficult to see.  He understands that if I am unable to place a stent in the retrograde fashion that a nephrostomy tube would be the next step.  However, in order to maximize kidney function and thus his treatment options for chemotherapy decompressing the kidney is necessary.  I discussed stent placement with the patient in detail, and he understands the procedure.  We discussed the possible risk and benefits of the operation and he has opted to proceed.  We will make him n.p.o. and schedule him for this afternoon.  Ardis Hughs

## 2019-04-23 NOTE — Progress Notes (Signed)
Occupational Therapy Treatment Patient Details Name: Chad Yu MRN: [id number] DOB: 10/17/1950 Today's Date: 04/23/2019    History of present illness 69 year old male comes in a chief complaint of chest pain and weakness. 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   OT comments  Patient supine in bed and agreeable to OT session.  Patient with limited tolerance due to reports of nausea/vomiting since yesterday morning but eager to move a little.  Complete mobility in room/hallway using RW with min guard, grooming at sink standing (oral care) with supervision, and transfers with min guard for safety.  Will follow acutely, progressing well.    Follow Up Recommendations  Home health OT;Supervision/Assistance - 24 hour(initally)    Equipment Recommendations  None recommended by OT(has DME )    Recommendations for Other Services      Precautions / Restrictions Precautions Precautions: Fall Restrictions Weight Bearing Restrictions: No       Mobility Bed Mobility Overal bed mobility: Needs Assistance Bed Mobility: Supine to Sit;Sit to Supine     Supine to sit: Supervision;HOB elevated Sit to supine: Supervision;HOB elevated   General bed mobility comments: Increased time. no assist rquired  Transfers Overall transfer level: Needs assistance Equipment used: Rolling walker (2 wheeled) Transfers: Sit to/from Stand Sit to Stand: Min guard         General transfer comment: for safety    Balance Overall balance assessment: Needs assistance         Standing balance support: Bilateral upper extremity supported;No upper extremity  supported;During functional activity Standing balance-Leahy Scale: Poor Standing balance comment: relaint on BUE support dynamically, able to complete grooming standing without support given supervision                           ADL either performed or assessed with clinical judgement   ADL Overall ADL's : Needs assistance/impaired     Grooming: Oral care;Supervision/safety;Standing                   Toilet Transfer: Min guard;Ambulation;RW Toilet Transfer Details (indicate cue type and reason): simulated in room         Functional mobility during ADLs: Min guard;Rolling walker General ADL Comments: patient with limited tolerance, reports nausea and vomiting since yesterday     Vision       Perception     Praxis      Cognition Arousal/Alertness: Awake/alert Behavior During Therapy: WFL for tasks assessed/performed Overall Cognitive Status: Within Functional Limits for tasks assessed                                          Exercises     Shoulder Instructions       General Comments      Pertinent Vitals/ Pain       Pain Assessment: 0-10 Pain Score: 5  Pain Location: L back (kidney pain) Pain Descriptors / Indicators: Stabbing;Sharp Pain Intervention(s): Monitored during session;Repositioned;Limited activity within patient's tolerance  Home Living  Prior Functioning/Environment              Frequency  Min 2X/week        Progress Toward Goals  OT Goals(current goals can now be found in the care plan section)  Progress towards OT goals: Progressing toward goals  Acute Rehab OT Goals Patient Stated Goal: decrease pain, and get back home OT Goal Formulation: With patient  Plan Discharge plan remains appropriate;Frequency remains appropriate    Co-evaluation                 AM-PAC OT "6 Clicks" Daily Activity     Outcome Measure   Help from  another person eating meals?: A Little Help from another person taking care of personal grooming?: A Little Help from another person toileting, which includes using toliet, bedpan, or urinal?: A Little Help from another person bathing (including washing, rinsing, drying)?: A Lot Help from another person to put on and taking off regular upper body clothing?: None Help from another person to put on and taking off regular lower body clothing?: A Lot 6 Click Score: 17    End of Session Equipment Utilized During Treatment: Rolling walker  OT Visit Diagnosis: Unsteadiness on feet (R26.81);Other abnormalities of gait and mobility (R26.89);Muscle weakness (generalized) (M62.81);Pain Pain - Right/Left: Right Pain - part of body: (kidney/back (L))   Activity Tolerance Patient tolerated treatment well   Patient Left in bed;with call bell/phone within reach;with bed alarm set   Nurse Communication Mobility status        Time: [id number] OT Time Calculation (min): 17 min  Charges: OT General Charges $OT Visit: 1 Visit OT Treatments $Self Care/Home Management : 8-22 mins  Jolaine Artist, [place] Pager [phone] Office [place] 04/23/2019, 10:56 AM

## 2019-04-24 ENCOUNTER — Other Ambulatory Visit: Payer: Medicare Other

## 2019-04-24 ENCOUNTER — Telehealth: Payer: Self-pay

## 2019-04-24 ENCOUNTER — Inpatient Hospital Stay: Payer: Medicare Other

## 2019-04-24 ENCOUNTER — Ambulatory Visit: Payer: Medicare Other | Admitting: Oncology

## 2019-04-24 DIAGNOSIS — I10 Essential (primary) hypertension: Secondary | ICD-10-CM

## 2019-04-24 DIAGNOSIS — C679 Malignant neoplasm of bladder, unspecified: Principal | ICD-10-CM

## 2019-04-24 DIAGNOSIS — I208 Other forms of angina pectoris: Secondary | ICD-10-CM

## 2019-04-24 DIAGNOSIS — K219 Gastro-esophageal reflux disease without esophagitis: Secondary | ICD-10-CM

## 2019-04-24 DIAGNOSIS — T451X5A Adverse effect of antineoplastic and immunosuppressive drugs, initial encounter: Secondary | ICD-10-CM

## 2019-04-24 DIAGNOSIS — N289 Disorder of kidney and ureter, unspecified: Secondary | ICD-10-CM

## 2019-04-24 DIAGNOSIS — I25118 Atherosclerotic heart disease of native coronary artery with other forms of angina pectoris: Secondary | ICD-10-CM

## 2019-04-24 DIAGNOSIS — E782 Mixed hyperlipidemia: Secondary | ICD-10-CM

## 2019-04-24 DIAGNOSIS — E1121 Type 2 diabetes mellitus with diabetic nephropathy: Secondary | ICD-10-CM

## 2019-04-24 DIAGNOSIS — D6481 Anemia due to antineoplastic chemotherapy: Secondary | ICD-10-CM

## 2019-04-24 LAB — CBC
HCT: 25.8 % — ABNORMAL LOW (ref 39.0–52.0)
Hemoglobin: 7.9 g/dL — ABNORMAL LOW (ref 13.0–17.0)
MCH: 29.6 pg (ref 26.0–34.0)
MCHC: 30.6 g/dL (ref 30.0–36.0)
MCV: 96.6 fL (ref 80.0–100.0)
Platelets: 207 10*3/uL (ref 150–400)
RBC: 2.67 MIL/uL — ABNORMAL LOW (ref 4.22–5.81)
RDW: 15.3 % (ref 11.5–15.5)
WBC: 6 10*3/uL (ref 4.0–10.5)
nRBC: 0 % (ref 0.0–0.2)

## 2019-04-24 LAB — GLUCOSE, CAPILLARY
Glucose-Capillary: 88 mg/dL (ref 70–99)
Glucose-Capillary: 207 mg/dL — ABNORMAL HIGH (ref 70–99)
Glucose-Capillary: 188 mg/dL — ABNORMAL HIGH (ref 70–99)
Glucose-Capillary: 146 mg/dL — ABNORMAL HIGH (ref 70–99)

## 2019-04-24 LAB — BASIC METABOLIC PANEL
Anion gap: 9 (ref 5–15)
BUN: 72 mg/dL — ABNORMAL HIGH (ref 8–23)
CO2: 20 mmol/L — ABNORMAL LOW (ref 22–32)
Calcium: 8.6 mg/dL — ABNORMAL LOW (ref 8.9–10.3)
Chloride: 108 mmol/L (ref 98–111)
Creatinine, Ser: 3.26 mg/dL — ABNORMAL HIGH (ref 0.61–1.24)
GFR calc Af Amer: 21 mL/min — ABNORMAL LOW (ref >60)
GFR calc non Af Amer: 18 mL/min — ABNORMAL LOW (ref >60)
Glucose, Bld: 99 mg/dL (ref 70–99)
Potassium: 5.3 mmol/L — ABNORMAL HIGH (ref 3.5–5.1)
Sodium: 137 mmol/L (ref 135–145)

## 2019-04-24 LAB — PREPARE RBC (CROSSMATCH)

## 2019-04-24 MED ORDER — SODIUM CHLORIDE 0.9% IV SOLUTION: Freq: Once | INTRAVENOUS | Status: AC

## 2019-04-24 NOTE — Telephone Encounter (Signed)
Patient is currently admitted in the hospital. Floor nurse Maudie Mercury called to inquire about rescheduling today's appointments per patient's wife request. Dr. Alen Blew made aware and per Dr. Alen Blew, resume appointments as scheduled on 4/29.Today's missed appointments will not be rescheduled. Maudie Mercury, RN made aware and will inform patient's wife.

## 2019-04-24 NOTE — Progress Notes (Signed)
Pt's MEWS briefly turned yellow, but it was due to a mistype of respirations of 48 instead of 18. No change in pt status and MEWS returned to green once corrected.

## 2019-04-24 NOTE — TOC Initial Note (Signed)
Transition of Care [facility]) - Initial/Assessment Note    Patient Details  Name: Chad Yu MRN: [id number] Date of Birth: [birth year]  Transition of Care [facility]) CM/SW Contact:    Lynnell Catalan, RN Phone Number: 04/24/2019, 10:08 AM  Clinical Narrative:                 This CM met with pt and wife at bedside for dc planning. PT recommendations gone over with pt and wife. Both decline home health services at this time. Wife states that pt can walk around the house just fine and they have all the equipment that they need (walker, shower chair, BSC).   Expected Discharge Plan: Home/Self Care Barriers to Discharge: Continued Medical Work up   Expected Discharge Plan and Services Expected Discharge Plan: Home/Self Care    Prior Living Arrangements/Services   Lives with:: Spouse Patient language and need for interpreter reviewed:: Yes Do you feel safe going back to the place where you live?: Yes      Need for Family Participation in Patient Care: Yes (Comment) Care giver support system in place?: Yes (comment)      Activities of Daily Living Home Assistive Devices/Equipment: Cane (specify quad or straight), Walker (specify type) ADL Screening (condition at time of admission) Patient's cognitive ability adequate to safely complete daily activities?: Yes Is the patient deaf or have difficulty hearing?: No Does the patient have difficulty seeing, even when wearing glasses/contacts?: No Does the patient have difficulty concentrating, remembering, or making decisions?: No Patient able to express need for assistance with ADLs?: Yes Does the patient have difficulty dressing or bathing?: No Independently performs ADLs?: Yes (appropriate for developmental age) Does the patient have difficulty walking or climbing stairs?: No Weakness of Legs: Both Weakness of Arms/Hands: None  Permission Sought/Granted                  Emotional Assessment Appearance:: Appears stated  age Attitude/Demeanor/Rapport: Engaged Affect (typically observed): Appropriate Orientation: : Oriented to Self, Oriented to Place, Oriented to  Time, Oriented to Situation      Admission diagnosis:  AKI (acute kidney injury) ([place]) [N17.9] Anemia, unspecified type [D64.9] Acute renal failure superimposed on chronic kidney disease, unspecified CKD stage, unspecified acute renal failure type ([place]) [N17.9, N18.9] Patient Active Problem List   Diagnosis Date Noted  . AKI (acute kidney injury) ([place]) 04/20/2019  . Anemia associated with chemotherapy 04/20/2019  . Hyponatremia 04/20/2019  . Acute systolic HF (heart failure) ([place])   . Acute renal failure superimposed on stage 3b chronic kidney disease ([place])   . Symptomatic anemia 02/04/2019  . Thrombocytopenia ([place]) 02/04/2019  . Renal insufficiency 02/04/2019  . NSTEMI (non-ST elevated myocardial infarction) ([place]) 02/03/2019  . Statin myopathy 01/10/2019  . Unstable angina ([place]) 11/23/2018  . Chest pain 11/22/2018  . Goals of care, counseling/discussion 09/19/2018  . Bladder cancer ([place]) 09/19/2018  . Primary localized osteoarthritis of left knee 02/17/2016  . Bradycardia 12/16/2010  . DIZZINESS 04/27/2009  . CHEST PAIN 04/27/2009  . ABNORMAL ELECTROCARDIOGRAM 04/27/2009  . ABDOMINAL PAIN-RUQ 10/30/2008  . GASTRITIS 06/13/2008  . GERD 06/05/2008  . SWELLING MASS OR LUMP IN HEAD AND NECK 06/05/2008  . COUGH 06/05/2008  . HEMORRHOIDS 06/04/2008  . CAD, NATIVE VESSEL 04/09/2008  . 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 Hyperlipidemia 04/07/2008  . Essential hypertension 04/07/2008  . FATIGUE / MALAISE 04/07/2008  . DYSPNEA 04/07/2008   PCP:  [name], MD Pharmacy:   [id number]  [place], [place] - [address] [place] [address] Phone: [phone] Fax: [place], [place] - [address] [place] [address] [place] [address] Phone: [phone] Fax: [phone]     Social Determinants of Health (SDOH) Interventions    Readmission Risk Interventions Readmission Risk Prevention Plan 04/24/2019  Transportation Screening Complete  Medication Review Press photographer) Complete  PCP or Specialist appointment within 3-5 days of discharge Complete  HRI or [place] Complete  SW Recovery Care/Counseling Consult Complete  [place] Not Applicable  Some recent data might be hidden

## 2019-04-24 NOTE — Progress Notes (Signed)
IP PROGRESS NOTE  Subjective:   Chad Yu is known to me with a history of bladder cancer with advanced disease and known metastasis to the liver.  He is currently receiving Pembrolizumab without any major complications.  He presented on April 3 with complaints of weakness shortness of breath and found to have worsening renal failure as well as left hydronephrosis.  He underwent left ureteral stent placement with improvement in his kidney function currently.  He reports improvement in his overall appetite although still able to tolerate liquid diet at this time.  He is not reporting any worsening bone pain or back pain.  Objective:  Vital signs in last 24 hours: Temp:  [97.9 F (36.6 C)-99.7 F (37.6 C)] 98 F (36.7 C) (04/07 0428) Pulse Rate:  [60-72] 60 (04/07 0428) Resp:  [16-24] 18 (04/07 0428) BP: (105-121)/(56-72) 118/68 (04/07 0428) SpO2:  [91 %-99 %] 95 % (04/07 0428) Weight:  [212 lb 1.3 oz (96.2 kg)] 212 lb 1.3 oz (96.2 kg) (04/06 1349) Weight change:  Last BM Date: 04/21/19  Intake/Output from previous day: 04/06 0701 - 04/07 0700 In: 740 [P.O.:240; I.V.:500] Out: 1725 [Urine:1725] General: Alert, awake without distress. Head: Normocephalic atraumatic. Mouth: mucous membranes moist, pharynx normal without lesions Eyes: No scleral icterus.  Pupils are equal and round reactive to light. Resp: clear to auscultation bilaterally without rhonchi or wheezes or dullness to percussion. Cardio: regular rate and rhythm, S1, S2 normal, no murmur, click, rub or gallop GI: soft, non-tender; bowel sounds normal; no masses,  no organomegaly Musculoskeletal: No joint deformity or effusion. Neurological: No motor, sensory deficits.  Intact deep tendon reflexes. Skin: No rashes or lesions.    Lab Results: Recent Labs    04/23/19 0548 04/24/19 0546  WBC 8.5 6.0  HGB 8.6* 7.9*  HCT 28.4* 25.8*  PLT 224 207    BMET Recent Labs    04/23/19 0548 04/24/19 0546  NA 133*  137  K 5.2* 5.3*  CL 104 108  CO2 18* 20*  GLUCOSE 111* 99  BUN 75* 72*  CREATININE 4.00* 3.26*  CALCIUM 8.5* 8.6*    Studies/Results: DG C-Arm 1-60 Min-No Report  Result Date: 04/23/2019 Fluoroscopy was utilized by the requesting physician.  No radiographic interpretation.    Medications: I have reviewed the patient's current medications.  Assessment/Plan:  69 year old with:  1.  Bladder cancer diagnosed in August 2020.  He was found to have stage IV disease with hepatic metastasis.  He is currently on Pembrolizumab and has tolerated reasonably well.  He was scheduled to receive treatment today which will be deferred till next week given his recent hospitalization.  After few more cycles of therapy we will repeat imaging studies to assess response.  2. Acute renal failure: Related to hydronephrosis.  He status post stent placement with improvement in his kidney function.   3.  Anemia: Multifactorial in nature with renal insufficiency as well as malignancy contributing.  I am in favor of packed red cell transfusion to keep his hemoglobin close to 10 given his coronary disease.  4. Follow-up:  Will arrange a follow-up upon his discharge to resume therapy and further management of his malignancy.  41minutes were dedicated to this encounter.  More than 50% of the time was spent face-to-face discussing his disease status, laboratory data review as well as future plan of care discussion.    LOS: 4 days   Chad Yu 04/24/2019, 12:34 PM

## 2019-04-24 NOTE — Progress Notes (Signed)
PROGRESS NOTE    Chad Yu  [id number] DOB: April 13, 1950 DOA: 04/20/2019 PCP: Chad Mariscal, MD   Brief Narrative:  HPI per Dr. Barrington Ellison on 04/20/2019 Chad Yu is a 69 y.o. male with PMH of CKD, anemia, CAD s/p PCI, HTN, HLD, bladder cancer currently on chemo, T2DM presented to ED with chest pain which resolved but admitted for AKI on CKD.  Patient reports that he has felt weak for the past 1-2 weeks and that his appetite has been low. Today he had some chest pain over the right side of his chest while resting in bed. He took two Nitros and it resolved by the time he arrived to the ED. Currently denies any complaint other than vague weakness. He normally walks independently but reports he could not stand up today. Last urinated prior to coming to ER. He has only been eating oatmeal and scrambled eggs for the past 1-2 weeks as he reports nausea and vomiting if he tries to eat anything heavier. Currently denies any abdominal pain, nausea, vomiting. He does report his urine has been slightly pink in color over the last couple days. Denies headache, dizziness, fever, chills, cough, SOB, diarrhea, constipation, dysuria, hematochezia, melena, speech difficulty, confusion or any other complaints.  In the ED: Hemodynamically stable. Labs remarkable for: Na 130, K 5.4, CO2 19, glucose 310, BUN 71, Cr 4.04, Hgb 8.1, WBC 10.0. Initial trop 21.  Renal US: 1. Mild-to-moderate left-sided hydronephrosis. 2. Bilateral nonobstructing nephroliths are noted. 3. The bilateral ureteral jets were not visualized however this may be secondary to underdistention. 4. Incidentally noted splenomegaly. Admission requested for AKI on CKD.  **Interim History  Renal function worsened and urology was consulted and patient underwent a cystoscopy with a left ureteral stent placement on the left retrograde pyelography with interpretation.  His renal function is now trending downwards and is improving.  Oncology  recommends transfusing 1 more unit PRBC to keep hemoglobin close to 10 given his cardiac disease.  Also oncology recommends holding his current dose of Pembrolizumab and deferring it at least a week.  We will get PT and OT to further evaluate and treat given his weakness  Assessment & Plan:   Principal Problem:   Acute renal failure superimposed on stage 3b chronic kidney disease ([place]) Active Problems:   Type 2 diabetes with nephropathy ([place])   Hyperlipidemia   Essential hypertension   CAD, NATIVE VESSEL   GERD   CHEST PAIN   Bladder cancer ([place])   Renal insufficiency   AKI (acute kidney injury) ([place])   Anemia associated with chemotherapy   Hyponatremia  AKI on CKD IIIB suspect a combination of prerenal and postrenal from dehydration with poor oral intake and obstruction on this left ureter from his advancing cancer Metabolic Acidosis -Presented with cr 4.04, proteinuria, moderate hemoglobin on UA and hypovolema in the setting of poor oral intake. -He has obstruction of the distal left ureter from advancing cancer.  Seen on PET scan done in March 11 2019. -Renal ultrasound showed mild to moderate left-sided hydronephrosis and bilateral nephroliths Underwent Stent placement by Dr. Louis Meckel on 04/23/2019 -Baseline cr 1.88 with GFR 38 -Patient has a slight metabolic acidosis with a CO2 of 20, anion gap of 9, and a chloride level 108 -Patient's BUN/creatinine worsened and went from 69/4.09-74/4.31 but after stent placement and decompression his renal function is trending down and is now 72/3.26 -No Foley catheter was placed and will continue monitor strict I's note next- -avoid nephrotoxic medications,  contrast dyes, hypotension and renally dose medications next-continue monitor and trend renal function and repeat CMP in a.m.  Hyperkalemia -Patient's potassium is slightly elevated at 5.3 in the setting of his renal failure; on presentation was 5.2 -We will give a dose of Lokelma  and is on 5 mg p.o. twice daily for 3 days -Continue monitor and trend and repeat CMP in a.m.  Acute blood loss, likely multifactorial in the setting of hematuria on UA, Anemia of CKD and malignancy -Drop in hemoglobin 4/5 down to 6.9, transfused 1 unit PRBC 4/5 -Patient's hemoglobin/hematocrit 6.9/22.0 and trended up to 8.6/28.4 and now down to 7.9/25.8 today -Anemia panel on admission showed an iron level of 21, TIBC of 153, TIBC 174, saturation ratios of 12%, ferritin level 3072, transferrin levels 124 -Continue to monitor for signs and symptoms of bleeding; currently no overt bleeding noted -Dr. Alen Blew feels that this hemoglobin should be closer to 10 given his coronary disease and is in favor of packed red blood cell transfusion to keep his hemoglobin around there so we will give an additional 1 unit of PRBCs today -Repeat CBC in a.m.  Insulin-dependent diabetes with hyperglycemia and nephropathy -A1c 7.4in Jan 2021 and was 7.4 again on Admission  -C/w sensitive NovoLog insulin sliding scale, NovoLog 5 units subcu twice daily with meals as well as Lantus 10 units subcu nightly -Patient also takes Dulagultide 1.5 mg sq Every Sunday  -Avoid hypoglycemia -CBGs ranging from 88-207 and will need to continue monitor and trend carefully and adjust insulin as necessary  Mild to moderate L hydronephrosis/bilateral nephroliths -Minimal residual from bladder scan on 4/4 -Urology directing care and underwent stent placement  Splenomegaly, incidentally found -Incidental finding -Continue Surveillance -Avoid abdominal trauma and contact sports  Pseudohyponatremia -Management per above and is improved-patient sodium is now 137  Dehydration from poor oral intake -Continue gentle IV fluid -Encourage oral intake and have advanced diet from a full liquid diet to a soft diet -C/w IV anti emetics Prochlorperazine 10 mg p.o. every 6 as needed for nausea  Essential HTN -BP is at goal  currently 114/72 -C/w current cardiac meds including metoprolol tartrate 50 mg p.o. twice daily, isosorbide mononitrate 20 mg p.o. daily, as well as amlodipine milligrams p.o. daily  Intermittent nausea in the setting of malignancy -Continue as needed antiemetics with Prochlorperazine 10 mg p.o. every 6 as needed for nausea   Protein Calorie Malnutrition -Albumin was 2.4 -Patient dose have Muscle mass loss -Encourage increase protein calorie intake and diet has been advanced to Soft  -Will consult Nutritionist for further evaluation and recommendations  Abnormal LFTs -LFTs were trending down and now improved -Possibly related to chemo -Continue to monitor carefully and repeat CMP in a.m.  Resolved Chest Pain CAD -Chest pain absent on admission -Trop WNL at 16 -Continue aspirin 81 mg p.o. daily, clopidogrel 75 mg p.o. daily, metoprolol tartrate 50 mg p.o. twice daily, isosorbide mononitrate 120 mg p.o. 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 Alen Blew feels after a few more cycles of therapy they are going to repeat imaging studies to assess response -Continue with pain control with Hydrocodone-Acetaminophen 7.5-325 mg p.o. 6 hours as needed for moderate pain as well as Hydromorphone 1 mg every 4 hours as needed for severe pain  Chronic dCHF -Last 2D echo 03/19/19 showed LVEF 55-60% GIDD -Continue strict  I&O and daily weight; is -1.396 Liters since admission -Monitor volume status while on IV fluid we have stopped his IV fluids -Will be giving patient a another unit of blood today given the recommendations of Medical Oncology -Continue to monitor for signs and symptoms of volume overload very carefully  GERD -Cont Pantoprazole 20 mg po daily mg p.o. daily  HLD -Continuebecause icosapent Ethyl 2 g p.o. twice daily as he is allergic reactions to the use of statins   DVT prophylaxis: Heparin 5,000 units sq q8h Code Status: DO NOT RESUSCITATE  Family Communication: Discussed with Wife at bedside  Disposition Plan: Pending further improvement of his renal function and tolerance of diet.  Patient is from home and will also require PT OT evaluation to obtain a safe discharge disposition for this patient as he presented with weakness.   Consultants:   Urology  Medical Oncology   Procedures:  Procedure by Dr. Louis Meckel on 04/23/2019  1. Cystoscopy 2. left ureteral stent placement 3. left retrograde pyelography with interpretation    Antimicrobials: Anti-infectives (From admission, onward)   Start     Dose/Rate Route Frequency Ordered Stop   04/23/19 1445  ceFAZolin (ANCEF) IVPB 2g/100 mL premix  Status:  Discontinued     2 g 200 mL/hr over 30 Minutes Intravenous  Once 04/23/19 1440 04/23/19 1639   04/23/19 1440  ceFAZolin (ANCEF) 2-4 GM/100ML-% IVPB    Note to Pharmacy: Georgena Spurling   : cabinet override      04/23/19 1440 04/24/19 0244   04/23/19 1432  ciprofloxacin (CIPRO) 400 MG/200ML IVPB  Status:  Discontinued    Note to Pharmacy: Virgia Land   : cabinet override      04/23/19 1432 04/23/19 1445     Subjective: Seen and examined at bedside and states he is feeling better.  Denies any chest pain, lightheadedness or dizziness.  No nausea or vomiting.  States his abdomen is slightly sore.  Wife thinks he is doing so much better and states the patient "was grey on  admission and now actually has color."  No other concerns or complaints at this time.  Objective: Vitals:   04/24/19 0053 04/24/19 0055 04/24/19 0428 04/24/19 1335  BP: 121/72  118/68 114/72  Pulse: 70  60 70  Resp: (!) 24 20 18 18   Temp: 98.1 F (36.7 C)  98 F (36.7 C) 98 F (36.7 C)  TempSrc: Oral  Oral Oral  SpO2: 94%  95% 95%  Weight:      Height:        Intake/Output Summary (Last 24 hours) at 04/24/2019 1417 Last data filed at 04/24/2019 1307 Gross per 24 hour  Intake 740 ml  Output 2750 ml  Net -2010 ml   Filed Weights   04/20/19 1958 04/21/19 0126 04/23/19 1349  Weight: 103 kg 96.2 kg 96.2 kg   Examination: Physical Exam:  Constitutional: WN/WD overweight Caucasian male currently in NAD and appears calm and comfortable Eyes: Lids and conjunctivae normal, sclerae anicteric  ENMT: External Ears, Nose appear normal. Grossly normal hearing.   Neck: Appears normal, supple, no cervical masses,  normal ROM, no appreciable thyromegaly; no JVD Respiratory: Diminished to auscultation bilaterally, no wheezing, rales, rhonchi or crackles. Normal respiratory effort and patient is not tachypenic. No accessory muscle use.  Unlabored breathing Cardiovascular: RRR, no murmurs / rubs / gallops. S1 and S2 auscultated.  Trace to 1+ extremity edema.  Abdomen: Soft, mildly tender, distended secondary body habitus. Bowel sounds positive.  GU: Deferred. Musculoskeletal: No clubbing / cyanosis of digits/nails.   Skin: No rashes, lesions, ulcers on limited skin evaluation. No induration; Warm and dry.  Neurologic: CN 2-12 grossly intact with no focal deficits. Romberg sign and cerebellar reflexes not assessed.  Psychiatric: Normal judgment and insight. Alert and oriented x 3. Normal mood and appropriate affect.   Data Reviewed: I have personally reviewed following labs and imaging studies  CBC: Recent Labs  Lab 04/20/19 2045 04/21/19 0520 04/22/19 0538 04/23/19 0548 04/24/19 0546   WBC 10.0 8.3 8.0 8.5 6.0  NEUTROABS 8.7*  --   --   --   --   HGB 8.1* 7.6* 6.9* 8.6* 7.9*  HCT 26.3* 24.5* 22.0* 28.4* 25.8*  MCV 96.7 95.0 97.3 95.9 96.6  PLT 225 216 190 224 [id number]   Basic Metabolic Panel: Recent Labs  Lab 04/20/19 2045 04/20/19 2045 04/20/19 2335 04/21/19 0520 04/21/19 0619 04/21/19 1533 04/22/19 0538 04/23/19 0548 04/24/19 0546  NA 130*  --   --  133*  --   --  132* 133* 137  K 5.4*   < >  --  5.3*  --  5.1 4.6 5.2* 5.3*  CL 100  --   --  103  --   --  103 104 108  CO2 19*  --   --  21*  --   --  19* 18* 20*  GLUCOSE 310*  --   --  194*  --   --  102* 111* 99  BUN 71*  --   --  69*  --   --  74* 75* 72*  CREATININE 4.04*  --   --  4.09*  --   --  4.31* 4.00* 3.26*  CALCIUM 8.6*  --   --  8.4*  --   --  8.2* 8.5* 8.6*  MG  --   --  2.2  --   --   --  2.1  --   --   PHOS  --   --   --   --  4.6  --  4.6  --   --    < > = values in this interval not displayed.   GFR: Estimated Creatinine Clearance: 25.7 mL/min (A) (by C-G formula based on SCr of 3.26 mg/dL (H)). Liver Function Tests: Recent Labs  Lab 04/21/19 0520 04/23/19 0548  AST 40 28  ALT 59* 38  ALKPHOS 314* 226*  BILITOT 0.8 1.1  PROT 6.0* 5.8*  ALBUMIN 2.5* 2.4*   Recent Labs  Lab 04/23/19 0548  LIPASE 16   No results for input(s): AMMONIA in the last 168 hours. Coagulation Profile: Recent Labs  Lab 04/20/19 2045  INR 1.4*   Cardiac Enzymes: No results for input(s): CKTOTAL, CKMB, CKMBINDEX, TROPONINI in the last 168 hours. BNP (last 3 results) No results for input(s): PROBNP in the last 8760 hours. HbA1C: Recent Labs    04/22/19 0536  HGBA1C 7.4*   CBG: Recent Labs  Lab 04/23/19 1539 04/23/19 1720 04/23/19 2105 04/24/19 0832 04/24/19 1204  GLUCAP 105* 129* 95 88 207*   Lipid Profile: No  results for input(s): CHOL, HDL, LDLCALC, TRIG, CHOLHDL, LDLDIRECT in the last 72 hours. Thyroid Function Tests: No results for input(s): TSH, T4TOTAL, FREET4, T3FREE, THYROIDAB  in the last 72 hours. Anemia Panel: No results for input(s): VITAMINB12, FOLATE, FERRITIN, TIBC, IRON, RETICCTPCT in the last 72 hours. Sepsis Labs: No results for input(s): PROCALCITON, LATICACIDVEN in the last 168 hours.  Recent Results (from the past 240 hour(s))  Respiratory Panel by RT PCR (Flu A&B, Covid) - Nasopharyngeal Swab     Status: None   Collection Time: 04/20/19 11:50 PM   Specimen: Nasopharyngeal Swab  Result Value Ref Range Status   SARS Coronavirus 2 by RT PCR NEGATIVE NEGATIVE Final    Comment: (NOTE) SARS-CoV-2 target nucleic acids are NOT DETECTED. The SARS-CoV-2 RNA is generally detectable in upper respiratoy specimens during the acute phase of infection. The lowest concentration of SARS-CoV-2 viral copies this assay can detect is 131 copies/mL. A negative result does not preclude SARS-Cov-2 infection and should not be used as the sole basis for treatment or other patient management decisions. A negative result may occur with  improper specimen collection/handling, submission of specimen other than nasopharyngeal swab, presence of viral mutation(s) within the areas targeted by this assay, and inadequate number of viral copies (<131 copies/mL). A negative result must be combined with clinical observations, patient history, and epidemiological information. The expected result is Negative. Fact Sheet for Patients:  PinkCheek.be Fact Sheet for Healthcare Providers:  GravelBags.it This test is not yet ap proved or cleared by the [place] FDA and  has been authorized for detection and/or diagnosis of SARS-CoV-2 by FDA under an Emergency Use Authorization (EUA). This EUA will remain  in effect (meaning this test can be used) for the duration of the COVID-19 declaration under Section 564(b)(1) of the Act, 21 U.S.C. section 360bbb-3(b)(1), unless the authorization is terminated or revoked sooner.     Influenza A by PCR NEGATIVE NEGATIVE Final   Influenza B by PCR NEGATIVE NEGATIVE Final    Comment: (NOTE) The Xpert Xpress SARS-CoV-2/FLU/RSV assay is intended as an aid in  the diagnosis of influenza from Nasopharyngeal swab specimens and  should not be used as a sole basis for treatment. Nasal washings and  aspirates are unacceptable for Xpert Xpress SARS-CoV-2/FLU/RSV  testing. Fact Sheet for Patients: PinkCheek.be Fact Sheet for Healthcare Providers: GravelBags.it This test is not yet approved or cleared by the [place] FDA and  has been authorized for detection and/or diagnosis of SARS-CoV-2 by  FDA under an Emergency Use Authorization (EUA). This EUA will remain  in effect (meaning this test can be used) for the duration of the  Covid-19 declaration under Section 564(b)(1) of the Act, 21  U.S.C. section 360bbb-3(b)(1), unless the authorization is  terminated or revoked. Performed at [facility], [place] [address], [place], [place] [address]      RN Pressure Injury Documentation:     Estimated body mass index is 28.76 kg/m as calculated from the following:   Height as of this encounter: 6' (1.829 m).   Weight as of this encounter: 96.2 kg.  Malnutrition Type:      Malnutrition Characteristics:      Nutrition Interventions:      Radiology Studies: DG C-Arm 1-60 Min-No Report  Result Date: 04/23/2019 Fluoroscopy was utilized by the requesting physician.  No radiographic interpretation.   Scheduled Meds: . amLODipine  5 mg Oral Daily  . aspirin EC  81 mg Oral Daily  . clopidogrel  75 mg Oral Daily  . Dulaglutide  1.5 mg Subcutaneous Q Sun  . heparin  5,000 Units Subcutaneous Q8H  . icosapent Ethyl  2 g Oral BID  . insulin aspart  0-9 Units Subcutaneous TID WC  . insulin aspart  5 Units Subcutaneous BID WC  . insulin glargine  10 Units Subcutaneous QHS  . isosorbide mononitrate   120 mg Oral Daily  . metoprolol tartrate  50 mg Oral BID  . pantoprazole  20 mg Oral Daily  . Ensure Max Protein  11 oz Oral Daily  . sodium zirconium cyclosilicate  5 g Oral BID   Continuous Infusions: . sodium chloride 75 mL/hr at 04/24/19 0516    LOS: 4 days    Kerney Elbe, DO [facility] PAGER is on AMION  If 7PM-7AM, please contact night-coverage [email or web address]

## 2019-04-25 ENCOUNTER — Other Ambulatory Visit: Payer: Medicare Other

## 2019-04-25 ENCOUNTER — Ambulatory Visit: Payer: Medicare Other | Admitting: Oncology

## 2019-04-25 ENCOUNTER — Telehealth: Payer: Self-pay | Admitting: Oncology

## 2019-04-25 DIAGNOSIS — E871 Hypo-osmolality and hyponatremia: Secondary | ICD-10-CM

## 2019-04-25 LAB — COMPREHENSIVE METABOLIC PANEL
ALT: 30 U/L (ref 0–44)
AST: 42 U/L — ABNORMAL HIGH (ref 15–41)
Albumin: 2.2 g/dL — ABNORMAL LOW (ref 3.5–5.0)
Alkaline Phosphatase: 225 U/L — ABNORMAL HIGH (ref 38–126)
Anion gap: 10 (ref 5–15)
BUN: 58 mg/dL — ABNORMAL HIGH (ref 8–23)
CO2: 19 mmol/L — ABNORMAL LOW (ref 22–32)
Calcium: 8.5 mg/dL — ABNORMAL LOW (ref 8.9–10.3)
Chloride: 108 mmol/L (ref 98–111)
Creatinine, Ser: 2.64 mg/dL — ABNORMAL HIGH (ref 0.61–1.24)
GFR calc Af Amer: 27 mL/min — ABNORMAL LOW (ref >60)
GFR calc non Af Amer: 24 mL/min — ABNORMAL LOW (ref >60)
Glucose, Bld: 178 mg/dL — ABNORMAL HIGH (ref 70–99)
Potassium: 5.1 mmol/L (ref 3.5–5.1)
Sodium: 137 mmol/L (ref 135–145)
Total Bilirubin: 0.9 mg/dL (ref 0.3–1.2)
Total Protein: 5.6 g/dL — ABNORMAL LOW (ref 6.5–8.1)

## 2019-04-25 LAB — CBC WITH DIFFERENTIAL/PLATELET
Abs Immature Granulocytes: 0.27 10*3/uL — ABNORMAL HIGH (ref 0.00–0.07)
Basophils Absolute: 0 10*3/uL (ref 0.0–0.1)
Basophils Relative: 0 %
Eosinophils Absolute: 0.2 10*3/uL (ref 0.0–0.5)
Eosinophils Relative: 3 %
HCT: 27.8 % — ABNORMAL LOW (ref 39.0–52.0)
Hemoglobin: 8.5 g/dL — ABNORMAL LOW (ref 13.0–17.0)
Immature Granulocytes: 4 %
Lymphocytes Relative: 7 %
Lymphs Abs: 0.5 10*3/uL — ABNORMAL LOW (ref 0.7–4.0)
MCH: 29.7 pg (ref 26.0–34.0)
MCHC: 30.6 g/dL (ref 30.0–36.0)
MCV: 97.2 fL (ref 80.0–100.0)
Monocytes Absolute: 0.5 10*3/uL (ref 0.1–1.0)
Monocytes Relative: 9 %
Neutro Abs: 4.7 10*3/uL (ref 1.7–7.7)
Neutrophils Relative %: 77 %
Platelets: 200 10*3/uL (ref 150–400)
RBC: 2.86 MIL/uL — ABNORMAL LOW (ref 4.22–5.81)
RDW: 15.3 % (ref 11.5–15.5)
WBC: 6.1 10*3/uL (ref 4.0–10.5)
nRBC: 0 % (ref 0.0–0.2)

## 2019-04-25 LAB — GLUCOSE, CAPILLARY
Glucose-Capillary: 117 mg/dL — ABNORMAL HIGH (ref 70–99)
Glucose-Capillary: 146 mg/dL — ABNORMAL HIGH (ref 70–99)

## 2019-04-25 LAB — TYPE AND SCREEN
ABO/RH(D): O POS
Antibody Screen: NEGATIVE
Unit division: 0

## 2019-04-25 LAB — BPAM RBC
Blood Product Expiration Date: 202105062359
ISSUE DATE / TIME: 202104071824
Unit Type and Rh: 5100

## 2019-04-25 LAB — PHOSPHORUS: Phosphorus: 3.6 mg/dL (ref 2.5–4.6)

## 2019-04-25 LAB — MAGNESIUM: Magnesium: 1.9 mg/dL (ref 1.7–2.4)

## 2019-04-25 MED ORDER — HYDROCODONE-ACETAMINOPHEN 7.5-325 MG PO TABS: 1.0000 | ORAL_TABLET | Freq: Four times a day (QID) | ORAL | 0 refills | Status: AC | PRN

## 2019-04-25 MED ORDER — ENSURE MAX PROTEIN PO LIQD: 11.0000 [oz_av] | Freq: Every day | ORAL | 0 refills | Status: AC

## 2019-04-25 NOTE — Telephone Encounter (Signed)
Scheduled appt per 4/7 sch message - unable to reach pt . And unable to leave msg - vmail full

## 2019-04-25 NOTE — Discharge Summary (Signed)
Physician Discharge Summary  Chad Yu [id number] DOB: 12-07-1950 DOA: 04/20/2019  PCP: [name], MD  Admit date: 04/20/2019 Discharge date: 04/25/2019  Admitted From: Home Disposition: Home with Home Health   Recommendations for Outpatient Follow-up:  1. Follow up with PCP in 1-2 weeks 2. Follow up with Urology Dr. Karsten Ro within 1-2 weeks 3. Follow up with Medical Oncology within 1-2 weeks  4. Please obtain CMP/CBC, Mag, Phos in one week 5. Please follow up on the following pending results:  Home Health: Yes  Equipment/Devices: None  Discharge Condition: Stable  CODE STATUS: FULL CODE  Diet recommendation: Heart Healthy Diet   Brief/Interim Summary: Chad Subotnikis a 69 y.o.malewith PMH of CKD, anemia,CADs/pPCI, HTN,HLD, bladder cancer currently on chemo,T2DM presented to ED with chest pain which resolved but admitted for AKI on CKD.  Patient reports that he has felt weak for the past 1-2 weeks and that his appetite has been low. Today he had some chest pain over the right side of his chest while resting in bed. He took two Nitros and it resolved by the time he arrived to the ED. Currently denies any complaint other than vague weakness. He normally walks independently but reports he could not stand up today. Last urinated prior to coming to ER. He has only been eating oatmeal and scrambled eggs for the past 1-2 weeks as he reports nausea and vomiting if he tries to eat anything heavier. Currently denies any abdominal pain, nausea, vomiting. He does report his urine has been slightly pink in color over the last couple days.Denies headache, dizziness, fever, chills, cough, SOB, diarrhea, constipation, dysuria, hematochezia, melena, speech difficulty, confusion or any other complaints.  In the [id number] stable. Labs remarkable for: Na 130, K 5.4, CO2 19, glucose 310, BUN 71, Cr 4.04, Hgb 8.1, WBC 10.0. Initial trop 21.  Renal US: 1. Mild-to-moderate left-sided  hydronephrosis. 2. Bilateral nonobstructing nephroliths are noted. 3. The bilateral ureteral jets were not visualized however this may be secondary to underdistention. 4. Incidentally noted splenomegaly. Admission requested for AKI on CKD.  **Interim History  Renal function worsened and urology was consulted and patient underwent a cystoscopy with a left ureteral stent placement on the left retrograde pyelography with interpretation.  His renal function is now trending downwards and is improving.  Oncology recommends transfusing 1 more unit PRBC to keep hemoglobin close to 10 given his cardiac disease.  Also oncology recommends holding his current dose of Pembrolizumab and deferring it at least a week.  We will get PT and OT to further evaluate and treat given his weakness they are recommending home health at discharge.  Renal function improved further and he is deemed stable for discharge and will need to follow-up with PCP, medical oncology as well as urology in outpatient setting  Discharge Diagnoses:  Principal Problem:   Acute renal failure superimposed on stage 3b chronic kidney disease ([place]) Active Problems:   Type 2 diabetes with nephropathy ([place])   Hyperlipidemia   Essential hypertension   CAD, NATIVE VESSEL   GERD   CHEST PAIN   Bladder cancer ([place])   Renal insufficiency   AKI (acute kidney injury) ([place])   Anemia associated with chemotherapy   Hyponatremia  AKI on CKD IIIB suspecta combination of prerenal and postrenal from dehydration with poor oral intake and obstruction on this left ureter fromhisadvancing cancer Metabolic Acidosis -Presented with cr 4.04, proteinuria, moderate hemoglobin on UA and hypovolemain the setting of poor oral intake. -He has obstruction of the  distal left ureter from advancing cancer. Seen on PETscan done in February 222021. -Renal ultrasound showed mild to moderate left-sided hydronephrosis and bilateral nephroliths Underwent Stent  placement by Dr. Louis Meckel on 04/23/2019 -Baseline cr 1.88 with GFR 38 -Patient has a slight metabolic acidosis with a CO2 of 20, anion gap of 9, and a chloride level 108; today slightly worsened and had a CO2 of 19, anion gap of 10, chloride level of 108 -Patient's BUN/creatinine worsened and went from 69/4.09-74/4.31 but after stent placement and decompression his renal function is trending down and is now 56/2.64 -No Foley catheter was placed and will continue monitor strict I's note next- -avoid nephrotoxic medications, contrast dyes, hypotension and renally dose medications next-continue monitor and trend renal function and repeat CMP within 1 week  Hyperkalemia -Patient's potassium is slightly elevated at 5.3 in the setting of his renal failure; on presentation was 5.2 -We will give a dose of Lokelma and is on 5 mg p.o. twice daily for 3 days and is improving his potassium was 5.1 at the day of discharge -Continue monitor and trend and repeat CMP within 1 week  Acute blood loss, likely multifactorial in the setting of hematuria on UA, Anemia of CKD and malignancy -Drop in hemoglobin4/5down to 6.9, transfused1 unit PRBC 4/5 -Patient's hemoglobin/hematocrit 6.9/22.0 and trended up to 8.6/28.4 and now down to 7.9/25.8 today -Anemia panel on admission showed an iron level of 21, TIBC of 153, TIBC 174, saturation ratios of 12%, ferritin level 3072, transferrin levels 124 -Continue to monitor for signs and symptoms of bleeding; currently no overt bleeding noted -Dr. Alen Blew feels that this hemoglobin should be closer to 10 given his coronary disease and is in favor of packed red blood cell transfusion to keep his hemoglobin around there so we will give an additional 1 unit of PRBCs today -Repeat CBC this a.m. showed improvement with a hemoglobin/hematocrit of 8.5/27.8 -The patient will follow-up with medical oncology and defer to them to give additional units of blood  Insulin-dependent  diabetes with hyperglycemia and nephropathy -A1c 7.4in Jan 2021 and was 7.4 again on Admission  -C/w sensitive NovoLog insulin sliding scale, NovoLog 5 units subcu twice daily with meals as well as Lantus 10 units subcu nightly -Patient also takes Dulagultide 1.5 mg sq Every Sunday  -Avoid hypoglycemia -CBGs ranging from 59-2 07 and will need to continue monitor and trend carefully and adjust insulin as necessary in the outpatient setting  Mild to moderate L hydronephrosis/bilateral nephroliths -Minimal residual from bladder scan on 4/4 -Urologydirecting care and underwent stent placement and will follow up with Dr. Karsten Ro within 1 to 2 weeks  Splenomegaly, incidentally found -Incidental finding -Continue Surveillance -Avoid abdominal trauma and contact sports  Pseudohyponatremia -Management per above and is improved-patient sodium is now 137 -Follow-up in outpatient setting  Dehydration from poor oral intake -ContinuedgentleIV fluid -Encourage oral intake and have advanced diet from a full liquid diet to a soft diet -C/w IV anti emetics Prochlorperazine 10 mg p.o. every 6 as needed for nausea -Able to tolerate diet and will discontinue fluids at discharge  Essential HTN -BP is at goal currently 121/64 -C/w current cardiac meds including metoprolol tartrate 50 mg p.o. twice daily, isosorbide mononitrate 20 mg p.o. daily, as well as amlodipine milligrams p.o. daily  Intermittent nausea in the setting of malignancy -Continue as needed antiemetics with Prochlorperazine 10 mg p.o. every 6 as needed for nausea   Protein Calorie Malnutrition -Albumin was 2.2 -Patient dose have Muscle  mass loss -Encourage increase protein calorie intake and diet has been advanced to Soft  -Will consult Nutritionist for further evaluation and recommendations  Abnormal LFTs -LFTs were trending down and now improved but AST is slightly elevated at 42 -Possibly related to chemo -Continue  to monitor carefully and repeat CMP within 1 week  Resolved Chest Pain CAD -Chest pain absent on admission -Trop WNL at 16 -Continue aspirin 81 mg p.o. daily, clopidogrel 75 mg p.o. daily, metoprolol tartrate 50 mg p.o. twice daily, isosorbide mononitrate 120 mg p.o. 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 Alen Blew feels after a few more cycles of therapy they are going to repeat imaging studies to assess response -Continue with pain control with Hydrocodone-Acetaminophen 7.5-325 mg p.o. 6 hours as needed for moderate pain as well as Hydromorphone 1 mg every 4 hours as needed for severe pain -Discharged home on a short course of hydrocodone-acetaminophen and defer to oncology to refill  Chronic dCHF -Last 2D echo 03/19/19 showed LVEF 55-60% GIDD -Continue strict I&O and daily weight; is - 2.996 liters since admission -Monitor volume status while on IV fluid we have stopped his IV fluids -Will be giving patient a another unit of blood today given the recommendations of Medical Oncology -Continue to monitor for signs and symptoms of volume overload very carefully and follow-up in the outpatient setting with cardiology  GERD -Cont Pantoprazole 20 mg po daily mg p.o. daily  HLD -Continuebecause icosapent Ethyl 2 g p.o. twice daily as he is allergic reactions to the use of  statins -Continue with Repatha at discharge   Discharge Instructions  Discharge Instructions    Call MD for:  difficulty breathing, headache or visual disturbances   Complete by: As directed    Call MD for:  extreme fatigue   Complete by: As directed    Call MD for:  hives   Complete by: As directed    Call MD for:  persistant dizziness or light-headedness   Complete by: As directed    Call MD for:  persistant nausea and vomiting   Complete by: As directed    Call MD for:  redness, tenderness, or signs of infection (pain, swelling, redness, odor or green/yellow discharge around incision site)   Complete by: As directed    Call MD for:  severe uncontrolled pain   Complete by: As directed    Call MD for:  temperature >100.4   Complete by: As directed    Diet - low sodium heart healthy   Complete by: As directed    Discharge instructions   Complete by: As directed    You were cared for by a hospitalist during your hospital stay. If you have any questions about your discharge medications or the care you received while you were in the hospital after you are discharged, you can call the unit and ask to speak with the hospitalist on call if the hospitalist that took care of you  is not available. Once you are discharged, your primary care physician will handle any further medical issues. Please note that NO REFILLS for any discharge medications will be authorized once you are discharged, as it is imperative that you return to your primary care physician (or establish a relationship with a primary care physician if you do not have one) for your aftercare needs so that they can reassess your need for medications and monitor your lab values.  Follow up with PCP, Urology, and Cardiology in the outpatient setting. Take all medications as prescribed. If symptoms change or worsen please return to the ED for evaluation   Increase activity slowly   Complete by: As directed      Allergies as of  04/25/2019      Reactions   Lisinopril Other (See Comments)   Severe cough Hair ball in throat   Statins Hives, Other (See Comments)   Other reaction(s): Cough (ALLERGY/intolerance), Myalgias (intolerance) No cholesterol meds   Atorvastatin Other (See Comments)   REACTION: myalgies   Rosuvastatin Itching      Medication List    TAKE these medications   amLODipine 5 MG tablet Commonly known as: NORVASC Take 1 tablet (5 mg total) by mouth daily.   aspirin 81 MG EC tablet Take 1 tablet (81 mg total) by mouth daily.   clopidogrel 75 MG tablet Commonly known as: PLAVIX Take 1 tablet (75 mg total) by mouth daily.   diphenoxylate-atropine 2.5-0.025 MG tablet Commonly known as: LOMOTIL 1 to 2 tablets PO QID prn diarrhea or stomach cramping   Ensure Max Protein Liqd Take 330 mLs (11 oz total) by mouth daily. Start taking on: April 26, 2019   HYDROcodone-acetaminophen 7.5-325 MG tablet Commonly known as: NORCO Take 1 tablet by mouth every 6 (six) hours as needed for up to 3 days for moderate pain.   insulin glargine 100 UNIT/ML injection Commonly known as: LANTUS Inject 50 Units into the skin at bedtime.   insulin lispro 100 UNIT/ML KwikPen Commonly known as: HUMALOG Inject 15 Units into the skin 2 (two) times daily.   isosorbide mononitrate 120 MG 24 hr tablet Commonly known as: IMDUR Take 1 tablet (120 mg total) by mouth daily.   Jardiance 25 MG Tabs tablet Generic drug: empagliflozin Take 25 mg by mouth daily.   metoprolol tartrate 50 MG tablet Commonly known as: LOPRESSOR Take 1 tablet (50 mg total) by mouth 2 (two) times daily.   multivitamin with minerals tablet Take 1 tablet by mouth daily.   nitroGLYCERIN 0.4 MG SL tablet Commonly known as: NITROSTAT Place 1 tablet (0.4 mg total) under the tongue every 5 (five) minutes as needed for chest pain.   ondansetron 4 MG disintegrating tablet Commonly known as: Zofran ODT Take 1 tablet (4 mg total) by mouth  every 8 (eight) hours as needed for nausea or vomiting.   pantoprazole 20 MG tablet Commonly known as: Protonix Take 1 tablet (20 mg total) by mouth daily.   Potassium Citrate 15 MEQ (1620 MG) Tbcr Take 1 tablet by mouth 2 (two) times daily.   Repatha SureClick [id number] MG/ML Soaj Generic drug: Evolocumab Inject 1 pen into the skin every 14 (fourteen) days.   tetrahydrozoline 0.05 % ophthalmic solution Place 1 drop into both eyes at bedtime as needed (eye irritation).   THERAWORX GLOVE + FOAM EX Apply 1 application topically 2 (two) times daily as needed (leg cramps).   Trulicity 1.5 0000000 Sopn Generic drug: Dulaglutide Inject 1.5 mg into the  skin every Sunday.   Vascepa 1 g capsule Generic drug: icosapent Ethyl Take 2 capsules (2 g total) by mouth 2 (two) times daily.   Voltaren 1 % Gel Generic drug: diclofenac sodium Apply 1 application topically at bedtime as needed (pain.).      Follow-up Information    [name], MD. Call.   Specialty: Internal Medicine Why: Follow up within 1-2 weeks Contact information: [place] [address] [phone]        Burnell Blanks, MD.   Specialty: Cardiology Contact information: [address] [place] [address] [phone]        Ardis Hughs, MD.   Specialty: Urology Why: The office will contact you to schedule a follow-up appointment in about 3 months for a stent change. Contact information: [address] [place] [address] [phone]          Allergies  Allergen Reactions  . Lisinopril Other (See Comments)    Severe cough Hair ball in throat  . Statins Hives and Other (See Comments)    Other reaction(s): Cough (ALLERGY/intolerance), Myalgias (intolerance) No cholesterol meds  . Atorvastatin Other (See Comments)    REACTION: myalgies  . Rosuvastatin Itching   Consultations:  Urology  Medical Oncology   Procedures/Studies: US Renal  Result  Date: May 17, 2019 CLINICAL DATA:  Hydronephrosis. EXAM: RENAL / URINARY TRACT ULTRASOUND COMPLETE COMPARISON:  None. FINDINGS: Right Kidney: Renal measurements: 16.2 x 5.1 x 6.6 cm = volume: 177 mL. There is no hydronephrosis. There is a 13 mm stone in the upper pole. Left Kidney: Renal measurements: 12.1 x 7.5 x 6.6 cm = volume: 380 mL. There is mild to moderate left-sided hydroureteronephrosis. There is an 8 mm nonobstructing stone in the interpolar region. Bladder: The ureteral jets were not visualized. Other: The spleen is significantly enlarged measuring approximately 782 mL in volume. IMPRESSION: 1. Mild-to-moderate left-sided hydronephrosis. 2. Bilateral nonobstructing nephroliths are noted. 3. The bilateral ureteral jets were not visualized however this may be secondary to underdistention. 4. Incidentally noted splenomegaly. Electronically Signed   By: Constance Holster M.D.   On: 17-May-2019 23:26   DG C-Arm 1-60 Min-No Report  Result Date: 04/23/2019 Fluoroscopy was utilized by the requesting physician.  No radiographic interpretation.   Procedure by Dr. Louis Meckel on 04/23/2019  1. Cystoscopy 2. leftureteral stent placement 3. leftretrograde pyelography with interpretation   Subjective: The patient was seen and examined at bedside he is feeling well.  Denies chest pain, lightheadedness or dizziness.  No nausea or vomiting.  Felt well on stable for discharge at home and he will need to follow-up with PCP as well as urology as well as medical oncology in outpatient setting.  We will continue physical therapy efforts given his weakness and has no other complaints prior to discharge.  Discharge Exam: Vitals:   04/24/19 2142 04/25/19 0506  BP: 129/66 121/64  Pulse: 66 70  Resp: 14 18  Temp: 97.9 F (36.6 C) 98.1 F (36.7 C)  SpO2: 96% 95%   Vitals:   04/24/19 1853 04/24/19 1855 04/24/19 2142 04/25/19 0506  BP: 115/74 115/74 129/66 121/64  Pulse: 72 71 66 70  Resp: 18  14 18    Temp: 97.9 F (36.6 C)  97.9 F (36.6 C) 98.1 F (36.7 C)  TempSrc: Oral  Oral Oral  SpO2: 97% 97% 96% 95%  Weight:      Height:       General: Pt is alert,  awake, not in acute distress Cardiovascular: RRR, S1/S2 +, no rubs, no gallops Respiratory: Diminished bilaterally, no wheezing, no rhonchi,: Unlabored breathing Abdominal: Soft, slightly tender, distended secondary body habitus, bowel sounds + Extremities: 1+ edema, no cyanosis  The results of significant diagnostics from this hospitalization (including imaging, microbiology, ancillary and laboratory) are listed below for reference.    Microbiology: Recent Results (from the past 240 hour(s))  Respiratory Panel by RT PCR (Flu A&B, Covid) - Nasopharyngeal Swab     Status: None   Collection Time: 04/20/19 11:50 PM   Specimen: Nasopharyngeal Swab  Result Value Ref Range Status   SARS Coronavirus 2 by RT PCR NEGATIVE NEGATIVE Final    Comment: (NOTE) SARS-CoV-2 target nucleic acids are NOT DETECTED. The SARS-CoV-2 RNA is generally detectable in upper respiratoy specimens during the acute phase of infection. The lowest concentration of SARS-CoV-2 viral copies this assay can detect is 131 copies/mL. A negative result does not preclude SARS-Cov-2 infection and should not be used as the sole basis for treatment or other patient management decisions. A negative result may occur with  improper specimen collection/handling, submission of specimen other than nasopharyngeal swab, presence of viral mutation(s) within the areas targeted by this assay, and inadequate number of viral copies (<131 copies/mL). A negative result must be combined with clinical observations, patient history, and epidemiological information. The expected result is Negative. Fact Sheet for Patients:  PinkCheek.be Fact Sheet for Healthcare Providers:  GravelBags.it This test is not yet ap proved or  cleared by the [place] FDA and  has been authorized for detection and/or diagnosis of SARS-CoV-2 by FDA under an Emergency Use Authorization (EUA). This EUA will remain  in effect (meaning this test can be used) for the duration of the COVID-19 declaration under Section 564(b)(1) of the Act, 21 U.S.C. section 360bbb-3(b)(1), unless the authorization is terminated or revoked sooner.    Influenza A by PCR NEGATIVE NEGATIVE Final   Influenza B by PCR NEGATIVE NEGATIVE Final    Comment: (NOTE) The Xpert Xpress SARS-CoV-2/FLU/RSV assay is intended as an aid in  the diagnosis of influenza from Nasopharyngeal swab specimens and  should not be used as a sole basis for treatment. Nasal washings and  aspirates are unacceptable for Xpert Xpress SARS-CoV-2/FLU/RSV  testing. Fact Sheet for Patients: PinkCheek.be Fact Sheet for Healthcare Providers: GravelBags.it This test is not yet approved or cleared by the [place] FDA and  has been authorized for detection and/or diagnosis of SARS-CoV-2 by  FDA under an Emergency Use Authorization (EUA). This EUA will remain  in effect (meaning this test can be used) for the duration of the  Covid-19 declaration under Section 564(b)(1) of the Act, 21  U.S.C. section 360bbb-3(b)(1), unless the authorization is  terminated or revoked. Performed at [facility], [place] [address] [place], [place] [address]     Labs: BNP (last 3 results) Recent Labs    11/22/18 1724  BNP [id number]   Basic Metabolic Panel: Recent Labs  Lab 04/20/19 2045 04/20/19 2335 04/21/19 0520 04/21/19 0520 04/21/19 [id number] 04/21/19 1533 04/22/19 0538 04/23/19 0548 04/24/19 0546 04/25/19 0544  NA   < >  --  133*  --   --   --  132* 133* 137 137  K   < >  --  5.3*   < >  --  5.1 4.6 5.2* 5.3* 5.1  CL   < >  --  103  --   --   --  103 104 108 108  CO2   < >  --  21*  --   --   --  19* 18* 20* 19*   GLUCOSE   < >  --  194*  --   --   --  102* 111* 99 178*  BUN   < >  --  69*  --   --   --  74* 75* 72* 58*  CREATININE   < >  --  4.09*  --   --   --  4.31* 4.00* 3.26* 2.64*  CALCIUM   < >  --  8.4*  --   --   --  8.2* 8.5* 8.6* 8.5*  MG  --  2.2  --   --   --   --  2.1  --   --  1.9  PHOS  --   --   --   --  4.6  --  4.6  --   --  3.6   < > = values in this interval not displayed.   Liver Function Tests: Recent Labs  Lab 04/21/19 0520 04/23/19 0548 04/25/19 0544  AST 40 28 42*  ALT 59* 38 30  ALKPHOS 314* 226* 225*  BILITOT 0.8 1.1 0.9  PROT 6.0* 5.8* 5.6*  ALBUMIN 2.5* 2.4* 2.2*   Recent Labs  Lab 04/23/19 0548  LIPASE 16   No results for input(s): AMMONIA in the last 168 hours. CBC: Recent Labs  Lab 04/20/19 2045 04/20/19 2045 04/21/19 0520 04/22/19 0538 04/23/19 0548 04/24/19 0546 04/25/19 0544  WBC 10.0   < > 8.3 8.0 8.5 6.0 6.1  NEUTROABS 8.7*  --   --   --   --   --  4.7  HGB 8.1*   < > 7.6* 6.9* 8.6* 7.9* 8.5*  HCT 26.3*   < > 24.5* 22.0* 28.4* 25.8* 27.8*  MCV 96.7   < > 95.0 97.3 95.9 96.6 97.2  PLT 225   < > 216 190 224 207 200   < > = values in this interval not displayed.   Cardiac Enzymes: No results for input(s): CKTOTAL, CKMB, CKMBINDEX, TROPONINI in the last 168 hours. BNP: Invalid input(s): POCBNP CBG: Recent Labs  Lab 04/24/19 1204 04/24/19 1636 04/24/19 2129 04/25/19 0748 04/25/19 1136  GLUCAP 207* 188* 146* 117* 146*   D-Dimer No results for input(s): DDIMER in the last 72 hours. Hgb A1c No results for input(s): HGBA1C in the last 72 hours. Lipid Profile No results for input(s): CHOL, HDL, LDLCALC, TRIG, CHOLHDL, LDLDIRECT in the last 72 hours. Thyroid function studies No results for input(s): TSH, T4TOTAL, T3FREE, THYROIDAB in the last 72 hours.  Invalid input(s): FREET3 Anemia work up No results for input(s): VITAMINB12, FOLATE, FERRITIN, TIBC, IRON, RETICCTPCT in the last 72 hours. Urinalysis    Component Value  Date/Time   COLORURINE YELLOW 04/20/2019 2345   APPEARANCEUR CLEAR 04/20/2019 2345   LABSPEC 1.015 04/20/2019 2345   PHURINE 5.0 04/20/2019 2345   GLUCOSEU NEGATIVE 04/20/2019 2345   HGBUR MODERATE (A) 04/20/2019 2345   BILIRUBINUR NEGATIVE 04/20/2019 2345   KETONESUR NEGATIVE 04/20/2019 2345   PROTEINUR 30 (A) 04/20/2019 2345   UROBILINOGEN 0.2 11/26/2008 0947   NITRITE NEGATIVE 04/20/2019 2345   LEUKOCYTESUR NEGATIVE 04/20/2019 2345   Sepsis Labs Invalid input(s): PROCALCITONIN,  WBC,  LACTICIDVEN Microbiology Recent Results (from the past 240 hour(s))  Respiratory Panel by RT PCR (Flu A&B, Covid) - Nasopharyngeal Swab  Status: None   Collection Time: 04/20/19 11:50 PM   Specimen: Nasopharyngeal Swab  Result Value Ref Range Status   SARS Coronavirus 2 by RT PCR NEGATIVE NEGATIVE Final    Comment: (NOTE) SARS-CoV-2 target nucleic acids are NOT DETECTED. The SARS-CoV-2 RNA is generally detectable in upper respiratoy specimens during the acute phase of infection. The lowest concentration of SARS-CoV-2 viral copies this assay can detect is 131 copies/mL. A negative result does not preclude SARS-Cov-2 infection and should not be used as the sole basis for treatment or other patient management decisions. A negative result may occur with  improper specimen collection/handling, submission of specimen other than nasopharyngeal swab, presence of viral mutation(s) within the areas targeted by this assay, and inadequate number of viral copies (<131 copies/mL). A negative result must be combined with clinical observations, patient history, and epidemiological information. The expected result is Negative. Fact Sheet for Patients:  PinkCheek.be Fact Sheet for Healthcare Providers:  GravelBags.it This test is not yet ap proved or cleared by the [place] FDA and  has been authorized for detection and/or diagnosis of  SARS-CoV-2 by FDA under an Emergency Use Authorization (EUA). This EUA will remain  in effect (meaning this test can be used) for the duration of the COVID-19 declaration under Section 564(b)(1) of the Act, 21 U.S.C. section 360bbb-3(b)(1), unless the authorization is terminated or revoked sooner.    Influenza A by PCR NEGATIVE NEGATIVE Final   Influenza B by PCR NEGATIVE NEGATIVE Final    Comment: (NOTE) The Xpert Xpress SARS-CoV-2/FLU/RSV assay is intended as an aid in  the diagnosis of influenza from Nasopharyngeal swab specimens and  should not be used as a sole basis for treatment. Nasal washings and  aspirates are unacceptable for Xpert Xpress SARS-CoV-2/FLU/RSV  testing. Fact Sheet for Patients: PinkCheek.be Fact Sheet for Healthcare Providers: GravelBags.it This test is not yet approved or cleared by the [place] FDA and  has been authorized for detection and/or diagnosis of SARS-CoV-2 by  FDA under an Emergency Use Authorization (EUA). This EUA will remain  in effect (meaning this test can be used) for the duration of the  Covid-19 declaration under Section 564(b)(1) of the Act, 21  U.S.C. section 360bbb-3(b)(1), unless the authorization is  terminated or revoked. Performed at [facility], [place] [address], [place], [place] [address]    Time coordinating discharge: 35 minutes  SIGNED:  Kerney Elbe, DO [facility] 04/25/2019, 8:48 PM Pager is on [place]  If 7PM-7AM, please contact night-coverage [email or web address] Password [email or web address]

## 2019-04-25 NOTE — Progress Notes (Signed)
Pt discharged home with spouse in stable condition. Discharge instructions given. Scripts sent to pharmacy of choice. No immediate questions at this time. Discharged from unit via wheelchair.

## 2019-04-26 ENCOUNTER — Other Ambulatory Visit: Payer: Self-pay | Admitting: Radiation Therapy

## 2019-04-29 NOTE — Progress Notes (Signed)
Pharmacist Chemotherapy Monitoring - Follow Up Assessment    I verify that I have reviewed each item in the below checklist:  . Regimen for the patient is scheduled for the appropriate day and plan matches scheduled date. Marland Kitchen Appropriate non-routine labs are ordered dependent on drug ordered. . If applicable, additional medications reviewed and ordered per protocol based on lifetime cumulative doses and/or treatment regimen.   Plan for follow-up and/or issues identified: No . I-vent associated with next due treatment: No  Chad Yu D 04/29/2019 1:53 PM

## 2019-05-03 ENCOUNTER — Inpatient Hospital Stay: Payer: Medicare Other

## 2019-05-03 ENCOUNTER — Inpatient Hospital Stay: Payer: Medicare Other | Attending: Oncology

## 2019-05-03 ENCOUNTER — Other Ambulatory Visit: Payer: Self-pay | Admitting: Oncology

## 2019-05-03 ENCOUNTER — Other Ambulatory Visit: Payer: Self-pay

## 2019-05-03 VITALS — BP 124/71 | HR 91 | Temp 98.9°F | Resp 18

## 2019-05-03 DIAGNOSIS — Z5112 Encounter for antineoplastic immunotherapy: Secondary | ICD-10-CM | POA: Diagnosis not present

## 2019-05-03 DIAGNOSIS — Z79899 Other long term (current) drug therapy: Secondary | ICD-10-CM | POA: Insufficient documentation

## 2019-05-03 DIAGNOSIS — C679 Malignant neoplasm of bladder, unspecified: Secondary | ICD-10-CM

## 2019-05-03 DIAGNOSIS — C678 Malignant neoplasm of overlapping sites of bladder: Secondary | ICD-10-CM

## 2019-05-03 DIAGNOSIS — D649 Anemia, unspecified: Secondary | ICD-10-CM

## 2019-05-03 LAB — CMP (CANCER CENTER ONLY)
ALT: 106 U/L — ABNORMAL HIGH (ref 0–44)
AST: 153 U/L — ABNORMAL HIGH (ref 15–41)
Albumin: 2.4 g/dL — ABNORMAL LOW (ref 3.5–5.0)
Alkaline Phosphatase: 570 U/L — ABNORMAL HIGH (ref 38–126)
Anion gap: 16 — ABNORMAL HIGH (ref 5–15)
BUN: 39 mg/dL — ABNORMAL HIGH (ref 8–23)
CO2: 18 mmol/L — ABNORMAL LOW (ref 22–32)
Calcium: 9 mg/dL (ref 8.9–10.3)
Chloride: 103 mmol/L (ref 98–111)
Creatinine: 2.19 mg/dL — ABNORMAL HIGH (ref 0.61–1.24)
GFR, Est AFR Am: 34 mL/min — ABNORMAL LOW (ref >60)
GFR, Estimated: 30 mL/min — ABNORMAL LOW (ref >60)
Glucose, Bld: 85 mg/dL (ref 70–99)
Potassium: 4.7 mmol/L (ref 3.5–5.1)
Sodium: 137 mmol/L (ref 135–145)
Total Bilirubin: 2.8 mg/dL — ABNORMAL HIGH (ref 0.3–1.2)
Total Protein: 6.9 g/dL (ref 6.5–8.1)

## 2019-05-03 LAB — CBC WITH DIFFERENTIAL (CANCER CENTER ONLY)
Abs Immature Granulocytes: 0.06 10*3/uL (ref 0.00–0.07)
Basophils Absolute: 0 10*3/uL (ref 0.0–0.1)
Basophils Relative: 0 %
Eosinophils Absolute: 0.1 10*3/uL (ref 0.0–0.5)
Eosinophils Relative: 1 %
HCT: 30 % — ABNORMAL LOW (ref 39.0–52.0)
Hemoglobin: 9.1 g/dL — ABNORMAL LOW (ref 13.0–17.0)
Immature Granulocytes: 1 %
Lymphocytes Relative: 8 %
Lymphs Abs: 0.4 10*3/uL — ABNORMAL LOW (ref 0.7–4.0)
MCH: 29.7 pg (ref 26.0–34.0)
MCHC: 30.3 g/dL (ref 30.0–36.0)
MCV: 98 fL (ref 80.0–100.0)
Monocytes Absolute: 0.5 10*3/uL (ref 0.1–1.0)
Monocytes Relative: 9 %
Neutro Abs: 4.3 10*3/uL (ref 1.7–7.7)
Neutrophils Relative %: 81 %
Platelet Count: 188 10*3/uL (ref 150–400)
RBC: 3.06 MIL/uL — ABNORMAL LOW (ref 4.22–5.81)
RDW: 15.9 % — ABNORMAL HIGH (ref 11.5–15.5)
WBC Count: 5.3 10*3/uL (ref 4.0–10.5)
nRBC: 0 % (ref 0.0–0.2)

## 2019-05-03 LAB — SAMPLE TO BLOOD BANK

## 2019-05-03 LAB — TSH: TSH: 2.746 u[IU]/mL (ref 0.320–4.118)

## 2019-05-03 MED ORDER — SODIUM CHLORIDE 0.9 % IV SOLN
Freq: Once | INTRAVENOUS | Status: AC
  Filled 2019-05-03: qty 250

## 2019-05-03 MED ORDER — HYDROCODONE-ACETAMINOPHEN 5-325 MG PO TABS: 1.0000 | ORAL_TABLET | Freq: Four times a day (QID) | ORAL | 0 refills | Status: AC | PRN

## 2019-05-03 MED ORDER — SODIUM CHLORIDE 0.9% FLUSH
10.0000 mL | INTRAVENOUS | Status: DC | PRN
  Filled 2019-05-03: qty 10

## 2019-05-03 MED ORDER — SODIUM CHLORIDE 0.9 % IV SOLN
200.0000 mg | Freq: Once | INTRAVENOUS | Status: AC
  Administered 2019-05-03: 200 mg via INTRAVENOUS
  Filled 2019-05-03: qty 8

## 2019-05-03 MED ORDER — HEPARIN SOD (PORK) LOCK FLUSH 100 UNIT/ML IV SOLN
500.0000 [IU] | Freq: Once | INTRAVENOUS | Status: DC | PRN
  Filled 2019-05-03: qty 5

## 2019-05-03 NOTE — Progress Notes (Signed)
Patient seen and examined in the treatment area today.  Risks and benefits of restarting Pembrolizumab was discussed.  Laboratory data were also reviewed which showed increase in his liver function tests and bilirubin.  This elevation is likely represents disease progression rather than drug toxicity.  He understands without treatments cancer would likely progress further.  I recommended proceeding with treatment today and obtaining imaging studies of the abdomen and pelvis before the next visit.  He is agreeable to proceed at this time.

## 2019-05-03 NOTE — Patient Instructions (Signed)
COVID-19 Vaccine Information can be found at: [email or web address] For questions related to vaccine distribution or appointments, please email [email or web address] or call [phone].   [place] Discharge Instructions for Patients Receiving Immunotherapy  Today you received the following immunotherapy agents: Pembrolizumab Chad Yu)  To help prevent nausea and vomiting after your treatment, we encourage you to take your nausea medication as directed by your provider.   If you develop nausea and vomiting that is not controlled by your nausea medication, call the clinic.   BELOW ARE SYMPTOMS THAT SHOULD BE REPORTED IMMEDIATELY:  *FEVER GREATER THAN 100.5 F  *CHILLS WITH OR WITHOUT FEVER  NAUSEA AND VOMITING THAT IS NOT CONTROLLED WITH YOUR NAUSEA MEDICATION  *UNUSUAL SHORTNESS OF BREATH  *UNUSUAL BRUISING OR BLEEDING  TENDERNESS IN MOUTH AND THROAT WITH OR WITHOUT PRESENCE OF ULCERS  *URINARY PROBLEMS  *BOWEL PROBLEMS  UNUSUAL RASH Items with * indicate a potential emergency and should be followed up as soon as possible.  Feel free to call the clinic should you have any questions or concerns. The clinic phone number is [phone].  Please show the [place] at check-in to the Emergency Department and triage nurse.  Pembrolizumab injection What is this medicine? PEMBROLIZUMAB (pem broe liz ue mab) is a monoclonal antibody. It is used to treat certain types of cancer. This medicine may be used for other purposes; ask your health care provider or pharmacist if you have questions. COMMON BRAND NAME(S): Keytruda What should I tell my health care provider before I take this medicine? They need to know if you have any of these conditions:  diabetes  immune system problems  inflammatory bowel disease  liver disease  lung or breathing disease  lupus  received or scheduled to receive  an organ transplant or a stem-cell transplant that uses donor stem cells  an unusual or allergic reaction to pembrolizumab, other medicines, foods, dyes, or preservatives  pregnant or trying to get pregnant  breast-feeding How should I use this medicine? This medicine is for infusion into a vein. It is given by a health care professional in a hospital or clinic setting. A special MedGuide will be given to you before each treatment. Be sure to read this information carefully each time. Talk to your pediatrician regarding the use of this medicine in children. While this drug may be prescribed for children as young as 6 months for selected conditions, precautions do apply. Overdosage: If you think you have taken too much of this medicine contact a poison control center or emergency room at once. NOTE: This medicine is only for you. Do not share this medicine with others. What if I miss a dose? It is important not to miss your dose. Call your doctor or health care professional if you are unable to keep an appointment. What may interact with this medicine? Interactions have not been studied. Give your health care provider a list of all the medicines, herbs, non-prescription drugs, or dietary supplements you use. Also tell them if you smoke, drink alcohol, or use illegal drugs. Some items may interact with your medicine. This list may not describe all possible interactions. Give your health care provider a list of all the medicines, herbs, non-prescription drugs, or dietary supplements you use. Also tell them if you smoke, drink alcohol, or use illegal drugs. Some items may interact with your medicine. What should I watch for while using this medicine? Your condition will be monitored carefully while you are receiving this medicine. You  may need blood work done while you are taking this medicine. Do not become pregnant while taking this medicine or for 4 months after stopping it. Women should inform  their doctor if they wish to become pregnant or think they might be pregnant. There is a potential for serious side effects to an unborn child. Talk to your health care professional or pharmacist for more information. Do not breast-feed an infant while taking this medicine or for 4 months after the last dose. What side effects may I notice from receiving this medicine? Side effects that you should report to your doctor or health care professional as soon as possible:  allergic reactions like skin rash, itching or hives, swelling of the face, lips, or tongue  bloody or black, tarry  breathing problems  changes in vision  chest pain  chills  confusion  constipation  cough  diarrhea  dizziness or feeling faint or lightheaded  fast or irregular heartbeat  fever  flushing  joint pain  low blood counts - this medicine may decrease the number of white blood cells, red blood cells and platelets. You may be at increased risk for infections and bleeding.  muscle pain  muscle weakness  pain, tingling, numbness in the hands or feet  persistent headache  redness, blistering, peeling or loosening of the skin, including inside the mouth  signs and symptoms of high blood sugar such as dizziness; dry mouth; dry skin; fruity breath; nausea; stomach pain; increased hunger or thirst; increased urination  signs and symptoms of kidney injury like trouble passing urine or change in the amount of urine  signs and symptoms of liver injury like dark urine, light-colored stools, loss of appetite, nausea, right upper belly pain, yellowing of the eyes or skin  sweating  swollen lymph nodes  weight loss Side effects that usually do not require medical attention (report to your doctor or health care professional if they continue or are bothersome):  decreased appetite  hair loss  muscle pain  tiredness This list may not describe all possible side effects. Call your doctor for  medical advice about side effects. You may report side effects to FDA at 1-800-FDA-1088. Where should I keep my medicine? This drug is given in a hospital or clinic and will not be stored at home. NOTE: This sheet is a summary. It may not cover all possible information. If you have questions about this medicine, talk to your doctor, pharmacist, or health care provider.  2020 Elsevier/Gold Standard (2018-11-09 18:07:58)  Coronavirus (COVID-19) Are you at risk?  Are you at risk for the Coronavirus (COVID-19)?  To be considered HIGH RISK for Coronavirus (COVID-19), you have to meet the following criteria:  . Traveled to [place], [place], [place], [place] or [place]; or in the [place] to [place], [place], [place], or [place]; and have fever, cough, and shortness of breath within the last 2 weeks of travel OR . Been in close contact with a person diagnosed with COVID-19 within the last 2 weeks and have fever, cough, and shortness of breath . IF YOU DO NOT MEET THESE CRITERIA, YOU ARE CONSIDERED LOW RISK FOR COVID-19.  What to do if you are HIGH RISK for COVID-19?  Marland Kitchen If you are having a medical emergency, call 911. . Seek medical care right away. Before you go to a doctor's office, urgent care or emergency department, call ahead and tell them about your recent travel, contact with someone diagnosed with COVID-19, and your symptoms.  You should receive instructions from your physician's office regarding next steps of care.  . When you arrive at healthcare provider, tell the healthcare staff immediately you have returned from visiting [place], [place], [place], [place] or [place]; or traveled in the [place] to [place], [place], [place], or [place]; in the last two weeks or you have been in close contact with a person diagnosed with COVID-19 in the last 2 weeks.   . Tell the health care staff about your symptoms: fever, cough and shortness of breath. . After you have been seen by  a medical provider, you will be either: o Tested for (COVID-19) and discharged home on quarantine except to seek medical care if symptoms worsen, and asked to  - Stay home and avoid contact with others until you get your results (4-5 days)  - Avoid travel on public transportation if possible (such as bus, train, or airplane) or o Sent to the Emergency Department by EMS for evaluation, COVID-19 testing, and possible admission depending on your condition and test results.  What to do if you are LOW RISK for COVID-19?  Reduce your risk of any infection by using the same precautions used for avoiding the common cold or flu:  Marland Kitchen Wash your hands often with soap and warm water for at least 20 seconds.  If soap and water are not readily available, use an alcohol-based hand sanitizer with at least 60% alcohol.  . If coughing or sneezing, cover your mouth and nose by coughing or sneezing into the elbow areas of your shirt or coat, into a tissue or into your sleeve (not your hands). . Avoid shaking hands with others and consider head nods or verbal greetings only. . Avoid touching your eyes, nose, or mouth with unwashed hands.  . Avoid close contact with people who are sick. . Avoid places or events with large numbers of people in one location, like concerts or sporting events. . Carefully consider travel plans you have or are making. . If you are planning any travel outside or inside the [place], visit the CDC's Travelers' Health webpage for the latest health notices. . If you have some symptoms but not all symptoms, continue to monitor at home and seek medical attention if your symptoms worsen. . If you are having a medical emergency, call 911.   [place] / e-Visit: [email or web address]         [facility]: [facility]: [id number]                   [facility]: [phone]

## 2019-05-03 NOTE — Progress Notes (Signed)
Per Dr. Alen Blew, okay for patient to receive treatment today with AST 153, ALT 106, total billirubin 2.8 and creatine 2.19

## 2019-05-08 ENCOUNTER — Inpatient Hospital Stay (HOSPITAL_COMMUNITY)
Admission: RE | Admit: 2019-05-08 | Discharge: 2019-05-08 | Disposition: A | Discharge status: Home / Self Care | Payer: Medicare Other | Source: Ambulatory Visit | Attending: Oncology | Admitting: Oncology

## 2019-05-08 ENCOUNTER — Other Ambulatory Visit: Payer: Self-pay

## 2019-05-08 DIAGNOSIS — C678 Malignant neoplasm of overlapping sites of bladder: Secondary | ICD-10-CM | POA: Insufficient documentation

## 2019-05-08 DIAGNOSIS — I214 Non-ST elevation (NSTEMI) myocardial infarction: Secondary | ICD-10-CM | POA: Diagnosis not present

## 2019-05-09 ENCOUNTER — Other Ambulatory Visit: Payer: Self-pay

## 2019-05-09 ENCOUNTER — Encounter (HOSPITAL_COMMUNITY): Payer: Self-pay

## 2019-05-09 ENCOUNTER — Emergency Department (HOSPITAL_COMMUNITY): Payer: Medicare Other

## 2019-05-09 DIAGNOSIS — D631 Anemia in chronic kidney disease: Secondary | ICD-10-CM | POA: Diagnosis present

## 2019-05-09 DIAGNOSIS — E875 Hyperkalemia: Secondary | ICD-10-CM

## 2019-05-09 DIAGNOSIS — I252 Old myocardial infarction: Secondary | ICD-10-CM

## 2019-05-09 DIAGNOSIS — N200 Calculus of kidney: Secondary | ICD-10-CM | POA: Diagnosis present

## 2019-05-09 DIAGNOSIS — D649 Anemia, unspecified: Secondary | ICD-10-CM | POA: Diagnosis not present

## 2019-05-09 DIAGNOSIS — C787 Secondary malignant neoplasm of liver and intrahepatic bile duct: Secondary | ICD-10-CM | POA: Diagnosis present

## 2019-05-09 DIAGNOSIS — N179 Acute kidney failure, unspecified: Secondary | ICD-10-CM | POA: Diagnosis present

## 2019-05-09 DIAGNOSIS — I130 Hypertensive heart and chronic kidney disease with heart failure and stage 1 through stage 4 chronic kidney disease, or unspecified chronic kidney disease: Secondary | ICD-10-CM | POA: Diagnosis present

## 2019-05-09 DIAGNOSIS — E785 Hyperlipidemia, unspecified: Secondary | ICD-10-CM | POA: Diagnosis present

## 2019-05-09 DIAGNOSIS — E860 Dehydration: Secondary | ICD-10-CM | POA: Diagnosis present

## 2019-05-09 DIAGNOSIS — Z9049 Acquired absence of other specified parts of digestive tract: Secondary | ICD-10-CM

## 2019-05-09 DIAGNOSIS — Z960 Presence of urogenital implants: Secondary | ICD-10-CM | POA: Diagnosis present

## 2019-05-09 DIAGNOSIS — I10 Essential (primary) hypertension: Secondary | ICD-10-CM | POA: Diagnosis present

## 2019-05-09 DIAGNOSIS — K219 Gastro-esophageal reflux disease without esophagitis: Secondary | ICD-10-CM | POA: Diagnosis present

## 2019-05-09 DIAGNOSIS — E1121 Type 2 diabetes mellitus with diabetic nephropathy: Secondary | ICD-10-CM | POA: Diagnosis not present

## 2019-05-09 DIAGNOSIS — N183 Chronic kidney disease, stage 3 unspecified: Secondary | ICD-10-CM

## 2019-05-09 DIAGNOSIS — Z7902 Long term (current) use of antithrombotics/antiplatelets: Secondary | ICD-10-CM

## 2019-05-09 DIAGNOSIS — D500 Iron deficiency anemia secondary to blood loss (chronic): Secondary | ICD-10-CM | POA: Diagnosis present

## 2019-05-09 DIAGNOSIS — I25118 Atherosclerotic heart disease of native coronary artery with other forms of angina pectoris: Secondary | ICD-10-CM

## 2019-05-09 DIAGNOSIS — I472 Ventricular tachycardia: Secondary | ICD-10-CM | POA: Diagnosis not present

## 2019-05-09 DIAGNOSIS — Z20822 Contact with and (suspected) exposure to covid-19: Secondary | ICD-10-CM | POA: Diagnosis present

## 2019-05-09 DIAGNOSIS — Z96653 Presence of artificial knee joint, bilateral: Secondary | ICD-10-CM | POA: Diagnosis present

## 2019-05-09 DIAGNOSIS — E872 Acidosis: Secondary | ICD-10-CM | POA: Diagnosis present

## 2019-05-09 DIAGNOSIS — I214 Non-ST elevation (NSTEMI) myocardial infarction: Secondary | ICD-10-CM | POA: Diagnosis not present

## 2019-05-09 DIAGNOSIS — R17 Unspecified jaundice: Secondary | ICD-10-CM

## 2019-05-09 DIAGNOSIS — I493 Ventricular premature depolarization: Secondary | ICD-10-CM | POA: Diagnosis not present

## 2019-05-09 DIAGNOSIS — Z794 Long term (current) use of insulin: Secondary | ICD-10-CM

## 2019-05-09 DIAGNOSIS — Z7982 Long term (current) use of aspirin: Secondary | ICD-10-CM

## 2019-05-09 DIAGNOSIS — Z87820 Personal history of traumatic brain injury: Secondary | ICD-10-CM

## 2019-05-09 DIAGNOSIS — D6481 Anemia due to antineoplastic chemotherapy: Secondary | ICD-10-CM | POA: Diagnosis present

## 2019-05-09 DIAGNOSIS — Z79899 Other long term (current) drug therapy: Secondary | ICD-10-CM

## 2019-05-09 DIAGNOSIS — I25110 Atherosclerotic heart disease of native coronary artery with unstable angina pectoris: Secondary | ICD-10-CM | POA: Diagnosis present

## 2019-05-09 DIAGNOSIS — I5022 Chronic systolic (congestive) heart failure: Secondary | ICD-10-CM | POA: Diagnosis present

## 2019-05-09 DIAGNOSIS — N189 Chronic kidney disease, unspecified: Secondary | ICD-10-CM | POA: Diagnosis not present

## 2019-05-09 DIAGNOSIS — N1832 Chronic kidney disease, stage 3b: Secondary | ICD-10-CM | POA: Diagnosis present

## 2019-05-09 DIAGNOSIS — T451X5A Adverse effect of antineoplastic and immunosuppressive drugs, initial encounter: Secondary | ICD-10-CM | POA: Diagnosis present

## 2019-05-09 DIAGNOSIS — E1122 Type 2 diabetes mellitus with diabetic chronic kidney disease: Secondary | ICD-10-CM | POA: Diagnosis present

## 2019-05-09 DIAGNOSIS — Z66 Do not resuscitate: Secondary | ICD-10-CM | POA: Diagnosis present

## 2019-05-09 DIAGNOSIS — C679 Malignant neoplasm of bladder, unspecified: Secondary | ICD-10-CM | POA: Diagnosis present

## 2019-05-09 DIAGNOSIS — I2510 Atherosclerotic heart disease of native coronary artery without angina pectoris: Secondary | ICD-10-CM | POA: Diagnosis present

## 2019-05-09 DIAGNOSIS — R079 Chest pain, unspecified: Secondary | ICD-10-CM | POA: Diagnosis not present

## 2019-05-09 DIAGNOSIS — Z8249 Family history of ischemic heart disease and other diseases of the circulatory system: Secondary | ICD-10-CM

## 2019-05-09 DIAGNOSIS — E871 Hypo-osmolality and hyponatremia: Secondary | ICD-10-CM | POA: Diagnosis present

## 2019-05-09 DIAGNOSIS — E8729 Other acidosis: Secondary | ICD-10-CM

## 2019-05-09 DIAGNOSIS — R319 Hematuria, unspecified: Secondary | ICD-10-CM | POA: Diagnosis present

## 2019-05-09 DIAGNOSIS — Z955 Presence of coronary angioplasty implant and graft: Secondary | ICD-10-CM

## 2019-05-09 LAB — BASIC METABOLIC PANEL
Anion gap: 16 — ABNORMAL HIGH (ref 5–15)
BUN: 47 mg/dL — ABNORMAL HIGH (ref 8–23)
CO2: 18 mmol/L — ABNORMAL LOW (ref 22–32)
Calcium: 9.3 mg/dL (ref 8.9–10.3)
Chloride: 98 mmol/L (ref 98–111)
Creatinine, Ser: 2.65 mg/dL — ABNORMAL HIGH (ref 0.61–1.24)
GFR calc Af Amer: 27 mL/min — ABNORMAL LOW (ref >60)
GFR calc non Af Amer: 24 mL/min — ABNORMAL LOW (ref >60)
Glucose, Bld: 121 mg/dL — ABNORMAL HIGH (ref 70–99)
Potassium: 5.6 mmol/L — ABNORMAL HIGH (ref 3.5–5.1)
Sodium: 132 mmol/L — ABNORMAL LOW (ref 135–145)

## 2019-05-09 LAB — CBC
HCT: 31.6 % — ABNORMAL LOW (ref 39.0–52.0)
Hemoglobin: 9.5 g/dL — ABNORMAL LOW (ref 13.0–17.0)
MCH: 30 pg (ref 26.0–34.0)
MCHC: 30.1 g/dL (ref 30.0–36.0)
MCV: 99.7 fL (ref 80.0–100.0)
Platelets: 200 10*3/uL (ref 150–400)
RBC: 3.17 MIL/uL — ABNORMAL LOW (ref 4.22–5.81)
RDW: 17.6 % — ABNORMAL HIGH (ref 11.5–15.5)
WBC: 6.2 10*3/uL (ref 4.0–10.5)
nRBC: 0 % (ref 0.0–0.2)

## 2019-05-09 LAB — URINALYSIS, ROUTINE W REFLEX MICROSCOPIC

## 2019-05-09 LAB — CREATININE, URINE, RANDOM: Creatinine, Urine: 136.74 mg/dL

## 2019-05-09 LAB — TYPE AND SCREEN
ABO/RH(D): O POS
Antibody Screen: NEGATIVE

## 2019-05-09 LAB — URINALYSIS, MICROSCOPIC (REFLEX)
RBC / HPF: >50 RBC/hpf (ref 0–5)
WBC, UA: >50 WBC/hpf (ref 0–5)

## 2019-05-09 LAB — GLUCOSE, CAPILLARY: Glucose-Capillary: 154 mg/dL — ABNORMAL HIGH (ref 70–99)

## 2019-05-09 LAB — TROPONIN I (HIGH SENSITIVITY)
Troponin I (High Sensitivity): 415 ng/L (ref <18)
Troponin I (High Sensitivity): 437 ng/L (ref <18)

## 2019-05-09 LAB — RESPIRATORY PANEL BY RT PCR (FLU A&B, COVID)
Influenza A by PCR: NEGATIVE
Influenza B by PCR: NEGATIVE
SARS Coronavirus 2 by RT PCR: NEGATIVE

## 2019-05-09 LAB — HEMOGLOBIN AND HEMATOCRIT, BLOOD
HCT: 28.4 % — ABNORMAL LOW (ref 39.0–52.0)
HCT: 27.8 % — ABNORMAL LOW (ref 39.0–52.0)
Hemoglobin: 8.6 g/dL — ABNORMAL LOW (ref 13.0–17.0)
Hemoglobin: 8.3 g/dL — ABNORMAL LOW (ref 13.0–17.0)

## 2019-05-09 LAB — HEPARIN LEVEL (UNFRACTIONATED): Heparin Unfractionated: 0.15 IU/mL — ABNORMAL LOW (ref 0.30–0.70)

## 2019-05-09 LAB — NA AND K (SODIUM & POTASSIUM), RAND UR
Potassium Urine: 50 mmol/L
Sodium, Ur: 40 mmol/L

## 2019-05-09 MED ORDER — ASPIRIN EC 81 MG PO TBEC
81.0000 mg | DELAYED_RELEASE_TABLET | Freq: Every day | ORAL | Status: DC
  Filled 2019-05-09: qty 1

## 2019-05-09 MED ORDER — ACETAMINOPHEN 325 MG PO TABS: 650.0000 mg | ORAL_TABLET | ORAL | Status: DC | PRN

## 2019-05-09 MED ORDER — SODIUM CHLORIDE 0.9% FLUSH: 3.0000 mL | Freq: Once | INTRAVENOUS | Status: DC

## 2019-05-09 MED ORDER — FENTANYL CITRATE (PF) 100 MCG/2ML IJ SOLN
25.0000 ug | Freq: Once | INTRAMUSCULAR | Status: AC
  Administered 2019-05-10: 25 ug via INTRAVENOUS
  Filled 2019-05-09: qty 2

## 2019-05-09 MED ORDER — ACETAMINOPHEN 325 MG PO TABS
650.0000 mg | ORAL_TABLET | Freq: Once | ORAL | Status: AC
  Administered 2019-05-09: 650 mg via ORAL
  Filled 2019-05-09: qty 2

## 2019-05-09 MED ORDER — ENSURE MAX PROTEIN PO LIQD
11.0000 [oz_av] | Freq: Every day | ORAL | Status: DC
  Filled 2019-05-09 (×2): qty 330

## 2019-05-09 MED ORDER — ONDANSETRON HCL 4 MG/2ML IJ SOLN
4.0000 mg | Freq: Four times a day (QID) | INTRAMUSCULAR | Status: DC | PRN
  Administered 2019-05-10: 4 mg via INTRAVENOUS
  Filled 2019-05-09: qty 2

## 2019-05-09 MED ORDER — OXYCODONE-ACETAMINOPHEN 5-325 MG PO TABS
1.0000 | ORAL_TABLET | Freq: Once | ORAL | Status: AC
  Administered 2019-05-09: 16:00:00 1 via ORAL
  Filled 2019-05-09: qty 1

## 2019-05-09 MED ORDER — INSULIN ASPART 100 UNIT/ML ~~LOC~~ SOLN: 0.0000 [IU] | Freq: Every day | SUBCUTANEOUS | Status: DC

## 2019-05-09 MED ORDER — CLOPIDOGREL BISULFATE 75 MG PO TABS
75.0000 mg | ORAL_TABLET | Freq: Every day | ORAL | Status: DC
  Administered 2019-05-09: 75 mg via ORAL
  Filled 2019-05-09 (×2): qty 1

## 2019-05-09 MED ORDER — PANTOPRAZOLE SODIUM 20 MG PO TBEC
20.0000 mg | DELAYED_RELEASE_TABLET | Freq: Every day | ORAL | Status: DC
  Filled 2019-05-09: qty 1

## 2019-05-09 MED ORDER — HEPARIN (PORCINE) 25000 UT/250ML-% IV SOLN
1500.0000 [IU]/h | INTRAVENOUS | Status: DC
  Administered 2019-05-09: 10:00:00 1200 [IU]/h via INTRAVENOUS
  Administered 2019-05-10: 1500 [IU]/h via INTRAVENOUS
  Filled 2019-05-09 (×2): qty 250

## 2019-05-09 MED ORDER — NITROGLYCERIN IN D5W 200-5 MCG/ML-% IV SOLN
0.0000 ug/min | INTRAVENOUS | Status: DC
  Administered 2019-05-09: 10:00:00 5 ug/min via INTRAVENOUS
  Administered 2019-05-10: 30 ug/min via INTRAVENOUS
  Filled 2019-05-09 (×2): qty 250

## 2019-05-09 MED ORDER — HEPARIN BOLUS VIA INFUSION
4000.0000 [IU] | Freq: Once | INTRAVENOUS | Status: AC
  Administered 2019-05-09: 10:00:00 4000 [IU] via INTRAVENOUS
  Filled 2019-05-09: qty 4000

## 2019-05-09 MED ORDER — INSULIN ASPART 100 UNIT/ML ~~LOC~~ SOLN
0.0000 [IU] | Freq: Three times a day (TID) | SUBCUTANEOUS | Status: DC
  Administered 2019-05-10: 2 [IU] via SUBCUTANEOUS

## 2019-05-09 MED ORDER — HYDROCODONE-ACETAMINOPHEN 5-325 MG PO TABS
1.0000 | ORAL_TABLET | Freq: Four times a day (QID) | ORAL | Status: DC | PRN
  Administered 2019-05-09: 1 via ORAL
  Filled 2019-05-09: qty 1

## 2019-05-09 MED ORDER — SODIUM CHLORIDE 0.9 % IV BOLUS
1000.0000 mL | Freq: Once | INTRAVENOUS | Status: AC
  Administered 2019-05-09: 1000 mL via INTRAVENOUS

## 2019-05-09 MED ORDER — MORPHINE SULFATE (PF) 4 MG/ML IV SOLN
4.0000 mg | Freq: Once | INTRAVENOUS | Status: AC
  Administered 2019-05-09: 4 mg via INTRAVENOUS
  Filled 2019-05-09: qty 1

## 2019-05-09 MED ORDER — HEPARIN BOLUS VIA INFUSION
3000.0000 [IU] | Freq: Once | INTRAVENOUS | Status: AC
  Administered 2019-05-09: 20:00:00 3000 [IU] via INTRAVENOUS
  Filled 2019-05-09: qty 3000

## 2019-05-09 NOTE — Progress Notes (Signed)
ANTICOAGULATION CONSULT NOTE - Initial Consult  Pharmacy Consult for Heparin Indication: chest pain/ACS  Allergies  Allergen Reactions  . Lisinopril Other (See Comments)    Severe cough Hair ball in throat  . Statins Hives and Other (See Comments)    Other reaction(s): Cough (ALLERGY/intolerance), Myalgias (intolerance) No cholesterol meds  . Atorvastatin Other (See Comments)    REACTION: myalgies  . Rosuvastatin Itching    Patient Measurements: Height: 6' (182.9 cm) Weight: 104.3 kg (230 lb) IBW/kg (Calculated) : 77.6 Heparin Dosing Weight: 98 kg  Vital Signs: Temp: 98 F (36.7 C) (04/22 0744) Temp Source: Oral (04/22 0744) BP: 132/75 (04/22 0930) Pulse Rate: 92 (04/22 0930)  Labs: Recent Labs    05/06/2019 0753  HGB 9.5*  HCT 31.6*  PLT 200  CREATININE 2.65*  TROPONINIHS 415*    Estimated Creatinine Clearance: 32.9 mL/min (A) (by C-G formula based on SCr of 2.65 mg/dL (H)).   Medical History: Past Medical History:  Diagnosis Date  . Bladder tumor   . Cancer (HCC)    BLADDER CANCER  . Coronary artery disease cardiologist-- dr [name] (per pt last office visit 10/ 2018)   s/p  PCI w/ DES x1 to diagonal branch of LAD  . DDD (degenerative disc disease), lumbar   . Degenerative joint disease   . History of concussion    per pt age 69 w/ brief loc--- no residual  . Hyperlipidemia   . Hypertension   . OA (osteoarthritis)   . S/P drug eluting coronary stent placement 10/24/2007   diagonal branch of LAD  . Type 2 diabetes mellitus treated with insulin ([place])    followed by pcp  . Varicose vein of leg   . Wears glasses     Medications:  (Not in a hospital admission)   Assessment: 69 YOM who presents with chest pain and elevated troponins. Pharmacy consulted to start IV heparin for ACS. H/H low, Plt wnl. SCr 2.65 (seems to be baseline for patient).   Goal of Therapy:  Heparin level 0.3-0.7 units/ml Monitor platelets by anticoagulation protocol: Yes    Plan:  -Heparin 4000 units IV bolus followed by heparin infusion at 1200 units/hr  -Fu 8 hr HL -Monitor daily HL, CBC and s/s of bleeding  Albertina Parr, PharmD., BCPS, BCCCP Clinical Pharmacist Clinical phone for 05/17/2019 until 3:30pm: [phone] If after 3:30pm, please refer to [facility] for unit-specific pharmacist

## 2019-05-09 NOTE — H&P (Signed)
History and Physical    Chad Yu [id number] DOB: Dec 21, 1950 DOA: 05/09/2019  Referring MD/NP/PA: Octaviano Glow, MD PCP: [name], MD  Patient coming from: home   Chief Complaint: Chest pain  I have personally briefly reviewed patient's old medical records in [place]   HPI: Chad Yu is a 69 y.o. male with medical history significant of bladder cancer on chemotherapy, CKD, anemia, CAD s/p PCI, HTN, and DM type II presented with complaints of chest pain.  Patient had just been hospitalized earlier this month with initial complaints of chest pain which resolved prior to arrival after receiving nitroglycerin.  He was seen to have have acute renal failure superimposed on chronic kidney disease secondary to a obstruction related with metastatic bladder cancer.  Urology had evaluated the patient and he underwent left ureteral stent placement.  Patient had undergone a repeat CT scan of the abdomen and pelvis yesterday which showed worsening malignancy with increased size of hepatic lesions, worsening abdominal/pelvic adenopathy, increased tumor size involving the bladder, and increase in lytic lesions.  He had recurrence of chest pain last night and took nitroglycerin 3 times throughout the night with intermittent relief in symptoms.  Patient reports having a sharp shooting pain in his abdomen that persists related to his cancer as well as left foot numbness.  He has also been having hematuria, but feels dehydrated.  He reports that he is only able to eat soft foods.  When he has time he would like to have a tube placed to give him food so he does not have to die hungry.  ED Course: Upon admission into the emergency department patient was seen to be mildly tachypneic, and all other vital signs relatively within normal limits.  Labs significant for hemoglobin 9.5, sodium 132, potassium 5.6, BUN 47, creatinine 2.56, troponin  415->437.  Chest x-ray was clear of any acute findings.   Cardiology had been consulted for patient's complaints of chest pain.  He was placed on a nitroglycerin drip as well as heparin drip due to continued complaints of chest pain.  Review of Systems  Constitutional: Positive for malaise/fatigue.  HENT: Negative for nosebleeds and sinus pain.   Eyes: Negative for photophobia and pain.  Respiratory: Positive for shortness of breath.   Cardiovascular: Positive for chest pain and leg swelling.  Gastrointestinal: Positive for abdominal pain, nausea and vomiting. Negative for diarrhea.  Genitourinary: Positive for hematuria.  Musculoskeletal: Positive for myalgias.  Neurological: Positive for weakness. Negative for focal weakness.  Psychiatric/Behavioral: Negative for substance abuse. The patient is not nervous/anxious.     Past Medical History:  Diagnosis Date  . Bladder tumor   . Cancer (HCC)    BLADDER CANCER  . Coronary artery disease cardiologist-- dr [name] (per pt last office visit 10/ 2018)   s/p  PCI w/ DES x1 to diagonal branch of LAD  . DDD (degenerative disc disease), lumbar   . Degenerative joint disease   . History of concussion    per pt age 31 w/ brief loc--- no residual  . Hyperlipidemia   . Hypertension   . OA (osteoarthritis)   . S/P drug eluting coronary stent placement 10/24/2007   diagonal branch of LAD  . Type 2 diabetes mellitus treated with insulin ([place])    followed by pcp  . Varicose vein of leg   . Wears glasses     Past Surgical History:  Procedure Laterality Date  . APPENDECTOMY  age 63  . COLONOSCOPY  2012  .  CORONARY ANGIOPLASTY  08-26-2010   dr [name]   previous stent patent,  proximal prior to previous stent 99% stenosis;  Unsuccessful multiple attempts PCI, unability to cross with a ballon;  medical management  . CORONARY ANGIOPLASTY WITH STENT PLACEMENT  10-23-2017  dr Darnell Level brodie   positive ischemia on myoview;   40% pLAD and D1 80%,  diagonal branch treated w/ PCI and DES   . CYSTOSCOPY W/  URETERAL STENT PLACEMENT Left 04/23/2019   Procedure: CYSTOSCOPY WITH RETROGRADE PYELOGRAM/URETERAL STENT PLACEMENT;  Surgeon: Ardis Hughs, MD;  Location: [facility];  Service: Urology;  Laterality: Left;  . EXCISION MIDLINE SUPERIOR THYOID ISTHMUS MASS  11-27-2008   dr Ernesto Rutherford   bening lipoma  . KNEE ARTHROSCOPY Left 01-22-2004   dr Percell Miller  . POSTERIOR LUMBAR FUSION  11-14-2012   dr Christella Noa @[facility]    L3 -- 5  . SHOULDER ARTHROSCOPY Right 2019  . SHOULDER ARTHROSCOPY WITH ROTATOR CUFF REPAIR AND SUBACROMIAL DECOMPRESSION Left 11-12-2009   dr Percell Miller  @[facility]    w/ Labral debridement and DCR  . TONSILLECTOMY  age 65  . TOTAL KNEE ARTHROPLASTY Left 02/17/2016   Procedure: TOTAL KNEE ARTHROPLASTY;  Surgeon: Ninetta Lights, MD;  Location: [facility];  Service: Orthopedics;  Laterality: Left;  . TOTAL KNEE ARTHROPLASTY Right 02-25-2004   dr Percell Miller  @[facility]   . TRANSURETHRAL RESECTION OF BLADDER TUMOR N/A 08/31/2018   Procedure: TRANSURETHRAL RESECTION OF BLADDER TUMOR (TURBT) WITH INSTILLATION OF POST OPERATIVE CHEMOTHERAPY;  Surgeon: Kathie Rhodes, MD;  Location: [facility];  Service: Urology;  Laterality: N/A;  . [place]     reports that he has never smoked. He has never used smokeless tobacco. He reports that he does not drink alcohol or use drugs.  Allergies  Allergen Reactions  . Lisinopril Other (See Comments)    Severe cough Hair ball in throat  . Statins Hives and Other (See Comments)    Other reaction(s): Cough (ALLERGY/intolerance), Myalgias (intolerance) No cholesterol meds  . Atorvastatin Other (See Comments)    REACTION: myalgies  . Rosuvastatin Itching    Family History  Problem Relation Age of Onset  . Heart attack Father   . Hypertension Father   . Heart attack Brother     Prior to Admission medications   Medication Sig Start Date End Date Taking? Authorizing Provider  aspirin EC 81 MG EC tablet Take 1 tablet (81 mg total) by mouth daily. 11/26/18  Yes Geradine Girt, DO  clopidogrel (PLAVIX) 75 MG tablet Take 1 tablet (75 mg total) by mouth daily. 02/05/19  Yes Barton Dubois, MD  diclofenac sodium (VOLTAREN) 1 % GEL Apply 1 application topically at bedtime as needed (pain.).   Yes [provider]  Dulaglutide (TRULICITY) 1.5 0000000 SOPN Inject 1.5 mg into the skin once a week.    Yes [provider]  Ensure Max Protein (ENSURE MAX PROTEIN) LIQD Take 330 mLs (11 oz total) by mouth daily. 04/26/19  Yes Sheikh, Omair Latif, DO  Evolocumab (REPATHA SURECLICK) [id number] MG/ML SOAJ Inject 1 pen into the skin every 14 (fourteen) days. 11/26/18  Yes Burnell Blanks, MD  Homeopathic Products [facility] GLOVE + FOAM EX) Apply 1 application topically 2 (two) times daily as needed (leg cramps).   Yes [provider]  HYDROcodone-acetaminophen (NORCO) 5-325 MG tablet Take 1 tablet by mouth every 6 (six) hours as needed for moderate pain. 05/03/19  Yes Wyatt Portela, MD  insulin glargine (LANTUS) 100 UNIT/ML  injection Inject 50 Units into the skin at bedtime.    Yes [provider]  insulin lispro (HUMALOG) 100 UNIT/ML KwikPen Inject 15 Units into the skin 2 (two) times daily. 09/19/18  Yes [provider]  isosorbide mononitrate (IMDUR) 120 MG 24 hr tablet Take 1 tablet (120 mg total) by mouth daily. 02/06/19  Yes Barton Dubois, MD  metoprolol tartrate (LOPRESSOR) 50 MG tablet Take 1 tablet (50 mg total) by mouth 2 (two) times daily. 01/02/19 04/29/2019 Yes Burtis Junes, NP  Multiple Vitamins-Minerals (MULTIVITAMIN WITH MINERALS) tablet Take 1 tablet by mouth daily.   Yes [provider]  nitroGLYCERIN (NITROSTAT) 0.4 MG SL tablet Place 1 tablet (0.4 mg total) under the tongue every 5 (five) minutes as needed for chest pain. 12/10/18 04/27/2019 Yes Burtis Junes, NP  ondansetron (ZOFRAN ODT) 4 MG disintegrating tablet Take 1 tablet (4 mg total) by mouth every 8 (eight) hours as needed for nausea or vomiting.  04/09/19  Yes Tanner, Lyndon Code., PA-C  Potassium Citrate 15 MEQ (1620 MG) TBCR Take 1 tablet by mouth daily.  03/11/19  Yes [provider]  tetrahydrozoline 0.05 % ophthalmic solution Place 1 drop into both eyes at bedtime as needed (eye irritation).   Yes [provider]  amLODipine (NORVASC) 5 MG tablet Take 1 tablet (5 mg total) by mouth daily. Patient not taking: Reported on 04/25/2019 02/19/19   Imogene Burn, PA-C  pantoprazole (PROTONIX) 20 MG tablet Take 1 tablet (20 mg total) by mouth daily. Patient not taking: Reported on 05/16/2019 04/09/19 04/08/20  Harle Stanford., PA-C  fenofibrate (TRICOR) 145 MG tablet Take 145 mg by mouth daily.    08/02/18  [provider]    Physical Exam:  Constitutional: Chronically ill-appearing male currently in no acute distress Vitals:   04/21/2019 1330 04/18/2019 1400 05/08/2019 1430 05/05/2019 1530  BP: 109/69 111/73 113/67 114/73  Pulse: 71 87 91 95  Resp: 18 19 20 20   Temp:      TempSrc:      SpO2: 97% 95% 98% 99%  Weight:      Height:       Eyes: PERRL, lids and conjunctivae normal ENMT: Mucous membranes are dry. Posterior pharynx clear of any exudate or lesions.  Neck: normal, supple, no masses, no thyromegaly Respiratory: clear to auscultation bilaterally, no wheezing, no crackles. Normal respiratory effort. No accessory muscle use.  Cardiovascular: Regular rate and rhythm, no murmurs / rubs / gallops. No extremity edema. 2+ pedal pulses. No carotid bruits.  Abdomen: Tenderness to palpation of the lower abdomen and right upper quadrant.  Bowel sounds present Musculoskeletal: no clubbing / cyanosis. No joint deformity upper and lower extremities. Good ROM, no contractures. Normal muscle tone.  Skin: Pallor present Neurologic: CN 2-12 grossly intact. Sensation intact, DTR normal. Strength 5/5 in all 4.  Psychiatric: Normal judgment and insight. Alert and oriented x 3. Normal mood.     Labs on Admission: I have personally  reviewed following labs and imaging studies  CBC: Recent Labs  Lab 05/03/19 1209 05/08/2019 0753  WBC 5.3 6.2  NEUTROABS 4.3  --   HGB 9.1* 9.5*  HCT 30.0* 31.6*  MCV 98.0 99.7  PLT 188 [id number]   Basic Metabolic Panel: Recent Labs  Lab 05/03/19 1209 04/21/2019 0753  NA 137 132*  K 4.7 5.6*  CL 103 98  CO2 18* 18*  GLUCOSE 85 121*  BUN 39* 47*  CREATININE 2.19* 2.65*  CALCIUM 9.0 9.3  GFR: Estimated Creatinine Clearance: 32.9 mL/min (A) (by C-G formula based on SCr of 2.65 mg/dL (H)). Liver Function Tests: Recent Labs  Lab 05/03/19 1209  AST 153*  ALT 106*  ALKPHOS 570*  BILITOT 2.8*  PROT 6.9  ALBUMIN 2.4*   No results for input(s): LIPASE, AMYLASE in the last 168 hours. No results for input(s): AMMONIA in the last 168 hours. Coagulation Profile: No results for input(s): INR, PROTIME in the last 168 hours. Cardiac Enzymes: No results for input(s): CKTOTAL, CKMB, CKMBINDEX, TROPONINI in the last 168 hours. BNP (last 3 results) No results for input(s): PROBNP in the last 8760 hours. HbA1C: No results for input(s): HGBA1C in the last 72 hours. CBG: No results for input(s): GLUCAP in the last 168 hours. Lipid Profile: No results for input(s): CHOL, HDL, LDLCALC, TRIG, CHOLHDL, LDLDIRECT in the last 72 hours. Thyroid Function Tests: No results for input(s): TSH, T4TOTAL, FREET4, T3FREE, THYROIDAB in the last 72 hours. Anemia Panel: No results for input(s): VITAMINB12, FOLATE, FERRITIN, TIBC, IRON, RETICCTPCT in the last 72 hours. Urine analysis:    Component Value Date/Time   COLORURINE YELLOW (A) 05/17/2019 1230   APPEARANCEUR CLOUDY (A) 05/13/2019 1230   LABSPEC  05/16/2019 1230    TEST NOT REPORTED DUE TO COLOR INTERFERENCE OF URINE PIGMENT   PHURINE  05/17/2019 1230    TEST NOT REPORTED DUE TO COLOR INTERFERENCE OF URINE PIGMENT   GLUCOSEU (A) 04/25/2019 1230    TEST NOT REPORTED DUE TO COLOR INTERFERENCE OF URINE PIGMENT   HGBUR (A) 05/05/2019 1230     TEST NOT REPORTED DUE TO COLOR INTERFERENCE OF URINE PIGMENT   BILIRUBINUR (A) 05/17/2019 1230    TEST NOT REPORTED DUE TO COLOR INTERFERENCE OF URINE PIGMENT   KETONESUR (A) 05/03/2019 1230    TEST NOT REPORTED DUE TO COLOR INTERFERENCE OF URINE PIGMENT   PROTEINUR (A) 05/05/2019 1230    TEST NOT REPORTED DUE TO COLOR INTERFERENCE OF URINE PIGMENT   UROBILINOGEN 0.2 11/26/2008 0947   NITRITE (A) 05/16/2019 1230    TEST NOT REPORTED DUE TO COLOR INTERFERENCE OF URINE PIGMENT   LEUKOCYTESUR (A) 04/24/2019 1230    TEST NOT REPORTED DUE TO COLOR INTERFERENCE OF URINE PIGMENT   Sepsis Labs: Recent Results (from the past 240 hour(s))  Respiratory Panel by RT PCR (Flu A&B, Covid) - Nasopharyngeal Swab     Status: None   Collection Time: 05/16/2019 10:07 AM   Specimen: Nasopharyngeal Swab  Result Value Ref Range Status   SARS Coronavirus 2 by RT PCR NEGATIVE NEGATIVE Final    Comment: (NOTE) SARS-CoV-2 target nucleic acids are NOT DETECTED. The SARS-CoV-2 RNA is generally detectable in upper respiratoy specimens during the acute phase of infection. The lowest concentration of SARS-CoV-2 viral copies this assay can detect is 131 copies/mL. A negative result does not preclude SARS-Cov-2 infection and should not be used as the sole basis for treatment or other patient management decisions. A negative result may occur with  improper specimen collection/handling, submission of specimen other than nasopharyngeal swab, presence of viral mutation(s) within the areas targeted by this assay, and inadequate number of viral copies (<131 copies/mL). A negative result must be combined with clinical observations, patient history, and epidemiological information. The expected result is Negative. Fact Sheet for Patients:  PinkCheek.be Fact Sheet for Healthcare Providers:  GravelBags.it This test is not yet ap proved or cleared by the [place]  FDA and  has been authorized for detection and/or diagnosis of SARS-CoV-2  by FDA under an Emergency Use Authorization (EUA). This EUA will remain  in effect (meaning this test can be used) for the duration of the COVID-19 declaration under Section 564(b)(1) of the Act, 21 U.S.C. section 360bbb-3(b)(1), unless the authorization is terminated or revoked sooner.    Influenza A by PCR NEGATIVE NEGATIVE Final   Influenza B by PCR NEGATIVE NEGATIVE Final    Comment: (NOTE) The Xpert Xpress SARS-CoV-2/FLU/RSV assay is intended as an aid in  the diagnosis of influenza from Nasopharyngeal swab specimens and  should not be used as a sole basis for treatment. Nasal washings and  aspirates are unacceptable for Xpert Xpress SARS-CoV-2/FLU/RSV  testing. Fact Sheet for Patients: PinkCheek.be Fact Sheet for Healthcare Providers: GravelBags.it This test is not yet approved or cleared by the [place] FDA and  has been authorized for detection and/or diagnosis of SARS-CoV-2 by  FDA under an Emergency Use Authorization (EUA). This EUA will remain  in effect (meaning this test can be used) for the duration of the  Covid-19 declaration under Section 564(b)(1) of the Act, 21  U.S.C. section 360bbb-3(b)(1), unless the authorization is  terminated or revoked. Performed at [facility], [place] [address], [place], [place] [address]      Radiological Exams on Admission: CT ABDOMEN PELVIS WO CONTRAST  Result Date: 04/26/2019 CLINICAL DATA:  Metastatic bladder cancer restaging EXAM: CT ABDOMEN AND PELVIS WITHOUT CONTRAST TECHNIQUE: Multidetector CT imaging of the abdomen and pelvis was performed following the standard protocol without IV contrast. COMPARISON:  Multiple exams, including 03/11/2019 FINDINGS: Lower chest: Mild atelectasis along the right hemidiaphragm. Hepatobiliary: Hypodense heterogeneity in the liver favoring progressive  malignancy given the extent of the hypodensity on today's exam compared to prior exams. For example, a lesion in the dome of the right hepatic lobe measures about 8.9 by 7.2 cm, previously about 3.6 by 3.9 cm on 03/11/2019. High density in the gallbladder suggesting sludge. Pancreas: Unremarkable Spleen: Unremarkable Adrenals/Urinary Tract: Adrenal glands normal. Left double-J ureteral stent in place with resolution of the prior hydronephrosis. Tumor along the left posterior bladder wall noted, thickness about 1.7 cm today, previously about 1.4 cm on 03/11/2019. There is extravesical soft tissue tumor adjacent to the bladder in the UVJ with the extravesical component measuring 3.3 by 1.8 cm, previously 3.0 by 1.8 cm. This tumor abuts the left seminal vesicle possibly the left side of the prostate gland. Nonobstructive bilateral nephrolithiasis. Stomach/Bowel: Unremarkable Vascular/Lymphatic: Aortoiliac atherosclerotic vascular disease. Increasing adenopathy in the porta hepatis and retroperitoneum. Index porta hepatis node 1.6 cm in short axis on image 32/2, formerly 1.2 cm. Index left retroperitoneal node 1.8 cm in short axis on image 45/2, previously 0.7 cm. Left obturator node 1.2 cm in short axis on image 76/2, formerly 0.4 cm. A separate left external iliac node measures 1.5 cm in short axis on image 74/2, previously 1.3 cm by my measurements. Reproductive: As noted above, extravesical tumor along the left side of the urinary bladder abuts the left seminal vesicle and possibly the left side of the prostate gland. Other: New small amount of perihepatic ascites and trace perisplenic ascites. Small amount of pelvic ascites noted. Musculoskeletal: Increase in size of the lytic bone lesion in the ischium with some early cortical breakthrough anteriorly and laterally, lesion measures 2.9 by 2.5 cm on image 91/2 (formerly 1.9 by 1.4 cm.). Increase conspicuity of 0.9 cm lytic lesion in the left iliac bone, image 68/2.  Postoperative findings in the lumbar spine with transitional  S1 segment and PLIF at L4-L5-S1. Anterolisthesis at L4-5, fused in position. IMPRESSION: 1. Overall worsening malignancy with increase in size of hepatic metastatic lesions; worsening abdominal and pelvic adenopathy; increase in tumor along the left side of the urinary bladder potentially with involvement of the seminal vesicle/prostate; and increase in size of the lytic bone lesions in the left iliac bone and ischium. 2. Left double-J ureteral stent with resolution of the prior hydronephrosis. 3. Nonobstructive bilateral nephrolithiasis. 4. New small amount of perihepatic ascites and trace perisplenic ascites. Small amount of pelvic ascites. 5. Aortic atherosclerosis. Aortic Atherosclerosis (ICD10-I70.0). Electronically Signed   By: Van Clines M.D.   On: 05/05/2019 08:12   DG Chest 2 View  Result Date: 05/08/2019 CLINICAL DATA:  Right-sided chest pain. EXAM: CHEST - 2 VIEW COMPARISON:  02/03/2019 FINDINGS: Moderate eventration of the right hemidiaphragm with overlying vascular crowding and streaky atelectasis. No infiltrates or effusions. The cardiac silhouette, mediastinal and hilar contours are within normal limits and stable. Mild calcification of the thoracic aorta. The bony thorax is intact. IMPRESSION: No acute cardiopulmonary findings. Electronically Signed   By: Marijo Sanes M.D.   On: 04/30/2019 08:31    EKG: Independently reviewed.  Sinus rhythm at 93 bpm unchanged from  Assessment/Plan  NSTEMI Chest pain CAD: Acute.  Patient sent with complaints of chest pain only reviewed.  Labs significant for elevated troponin 415->437.  Patient had been started on a heparin drip.  Cardiology was consulted and recommended continue on heparin drip for 48 hours as no acute intervention warranted at this time given worsening of kidney function and metastatic disease.  Pain currently on controlled with nitroglycerin drip. Suspect secondary  to demand. -Admit to a progressive bed -Continue heparin drip -Continue nitroglycerin drip -Continue aspirin and Plavix -Follow-up echocardiogram -Appreciate cardiology consultative services  Acute kidney injury superimposed on chronic kidney disease stage IIIB: Patient notes with creatinine elevated up to 2.65 with BUN 47.  At discharge from the hospital on 4/16 creatinine was 2.19. FeNa 0.05% suggesting prerenal cause of symptoms. -Normal saline IV fluids at -Recheck creatinine in a.m.   Hyperkalemia: Initial potassium elevated at 5.6.  Patient on potassium supplementation. -Hold potassium supplementation -IV fluids as seen above  Anemia of chronic blood loss, hematuria: Acute on chronic.  Patient reports continued hematuria.  Hemoglobin appears slightly improved to 9.5, but suspect this is secondary to hemoconcentration from dehydration.  He has required several blood transfusions in the past.  Patient was placed on heparin drip for his NSTEMI. -Type and screen for possible need of blood products -Serial monitoring of H&H -Consider discontinuing heparin drip for gross blood loss. -Transfuse blood products as needed  Systolic CHF: Last EF noted to be 45 to 50%.  Patient appears to be hypovolemic -Continue to monitor intake and output  Essential hypertension -Hold home blood pressure medications due to soft pressures  Metastatic bladder cancer: Patient on palliative chemotherapy in the outpatient setting.  Recent CT scan of the abdomen on 4/21 showing progression of metastatic disease.  Patient followed by Dr. Alen Blew in outpatient setting.  -Hydrocodone as needed -May want to courtesy notify oncology in a.m. -Question referral to care/hospice at this point given for  Insulin-dependent diabetes mellitus with hyperglycemia: Last hemoglobin A1c noted to be 7.4 in January 2021.  Patient reports poor p.o. intake with blood sugars the lower limits of normal. -Hypoglycemic protocol -CBGs  before every meal with sensitive SSI  Hyponatremia: Acute.  On admission sodium 132.  Suspecting secondary  to dehydration and poor oral intake. -IV fluids as above -Recheck sodium levels in a.m.  Bilateral nephrolithiasis: Patient underwent double-J stent ureteral stent placement on 4/6.  Repeat CT scan showing resolution of hydronephrosis. -Continue outpatient follow-up with Dr. Karsten Ro  GERD -Continue Protonix  DO NOT RESUSCITATE: Present on admission  DVT prophylaxis: Heparin Code Status: DNR Family Communication: Wife was not able to be reached at this time Disposition Plan: TBD Consults called: Cardiology Admission status:Inpatient  Norval Morton MD [facility] Pager [phone]   If 7PM-7AM, please contact night-coverage [email or web address] Password [email or web address]  05/04/2019, 3:36 PM

## 2019-05-09 NOTE — ED Triage Notes (Signed)
Pt reports Right side chest pain radiating to Left side of chest "for a while" abd pain x1 day. Pt reports he "threw up" yesterday while getting a CT scan at [facility]. Pt states "I have some kind of stage IV cancer, the kind you cant do anything with."

## 2019-05-09 NOTE — Consult Note (Addendum)
Cardiology Consultation:   Patient ID: Chad Yu MRN: [id number]; DOB: 09/17/1950  Admit date: 05/08/2019 Date of Consult: 04/22/2019  Primary Care Provider: [name], MD Primary Cardiologist: Chad Chandler, MD Primary Electrophysiologist:  None    Patient Profile:   Chad Yu is a 69 y.o. male with a hx of CAD with prior PCI, CKD stage III, bladder cancer with liver mets, HTN, DM, and HLD who is being seen today for the evaluation of chest pain at the request of Chad Yu.  History of Present Illness:   Chad Yu has a history of CAD with prior PCI to diagonal. Heart cath in 2012 with patent stent but sub-total occlusion prior to the stent in the diagonal branch that was not opened. He was admitted to [facility] 11/2018 with chest pain and mild CE elevation in the setting of anemia and CKD. Demand ischemia was suspected. Angiography was deferred due to renal function. Echo with normal EF and no WMA. At clinic follow up, he described ongoing exertional chest pain and his imdur was increased to 60 mg and lopressor increased to 50 mg BID.  He was continued on ASA and plavix. Unfortunately, he was hospitalized again Jan 2021 with exertional chest pain. CP had resolved by the time he arrived to the ER. He was admitted for acute on chronic renal insufficiency and anemia. He was transfused with improvement in his anemia, which coincided with improvement in his chest pain. Medical therapy for his angina was continued. Of note, EF was mildly reduced to 45-50% on echo, which was normalized on repeat echo in March 2021.  He was hospitalized earlier this month with progression of exertional chest pain that resolved prior to arrival with nitro. He reported chest pain and weakness were similar to prior episodes in which he required blood transfusions.  He was admitted for renal insufficiency found to  Have left ureteral obstruction from metastatic bladder cancer..  Ureteral stent was placed.  Imdur was increased to 120 mg.   He continues to receive palliative chemotherapy for bladder cancer. Oncologist is Chad Yu and is currently on pembrolizumab.   He presented back to [facility] today with complaints of chest pain, nausea, and vomiting. He has been treating his chest pain with nitroglycerin tablets, which will relieve the pain for 15 minutes, but then CP returns. He reports this feels similar to his chest pain in January. He underwent CT scan yesterday at [facility] as part of his cancer imaging that showed worsening malignancy with increased in size of hepatic metastatic lesions, worsening abdominal and pelvic adenopathy, and increased tumor on left side of urinary bladder. Cardiology was consulted for elevated troponin and chest pain.   The patient reports nausea and vomiting following PO contrast with CT scan yesterday. He vomited from about 2pm through midnight last night. Around midnight, he started having chest pain relieved with nitro. He states chest pain returned and he took a nitro about every 1.5 hrs throughout the night. This morning, return of chest pain prompted ER evaluation. He has been chest pain free since nitro drip was titrated. He has rested for a couple of hours. He has not eaten since yesterday morning. He also states that he urinated "straight blood" this morning.    Past Medical History:  Diagnosis Date  . Bladder tumor   . Cancer (HCC)    BLADDER CANCER  . Coronary artery disease cardiologist-- dr Chad Yu (per pt last office visit 10/ 2018)   s/p  PCI w/ DES x1  to diagonal branch of LAD  . DDD (degenerative disc disease), lumbar   . Degenerative joint disease   . History of concussion    per pt age 39 w/ brief loc--- no residual  . Hyperlipidemia   . Hypertension   . OA (osteoarthritis)   . S/P drug eluting coronary stent placement 10/24/2007   diagonal branch of LAD  . Type 2 diabetes mellitus treated with insulin ([place])    followed by pcp  . Varicose vein of  leg   . Wears glasses     Past Surgical History:  Procedure Laterality Date  . APPENDECTOMY  age 23  . COLONOSCOPY  2012  . CORONARY ANGIOPLASTY  08-26-2010   dr Chad Yu   previous stent patent,  proximal prior to previous stent 99% stenosis;  Unsuccessful multiple attempts PCI, unability to cross with a ballon;  medical management  . CORONARY ANGIOPLASTY WITH STENT PLACEMENT  10-23-2017  dr Chad Yu   positive ischemia on myoview;   40% pLAD and D1 80%,  diagonal branch treated w/ PCI and DES   . CYSTOSCOPY W/ URETERAL STENT PLACEMENT Left 04/23/2019   Procedure: CYSTOSCOPY WITH RETROGRADE PYELOGRAM/URETERAL STENT PLACEMENT;  Surgeon: Chad Hughs, MD;  Location: [facility];  Service: Urology;  Laterality: Left;  . EXCISION MIDLINE SUPERIOR THYOID ISTHMUS MASS  11-27-2008   dr Chad Yu   bening lipoma  . KNEE ARTHROSCOPY Left 01-22-2004   dr Chad Yu  . POSTERIOR LUMBAR FUSION  11-14-2012   dr Chad Yu @[facility]    L3 -- 5  . SHOULDER ARTHROSCOPY Right 2019  . SHOULDER ARTHROSCOPY WITH ROTATOR CUFF REPAIR AND SUBACROMIAL DECOMPRESSION Left 11-12-2009   dr Chad Yu  @[facility]    w/ Labral debridement and DCR  . TONSILLECTOMY  age 58  . TOTAL KNEE ARTHROPLASTY Left 02/17/2016   Procedure: TOTAL KNEE ARTHROPLASTY;  Surgeon: Chad Lights, MD;  Location: [facility];  Service: Orthopedics;  Laterality: Left;  . TOTAL KNEE ARTHROPLASTY Right 02-25-2004   dr Chad Yu  @[facility]   . TRANSURETHRAL RESECTION OF BLADDER TUMOR N/A 08/31/2018   Procedure: TRANSURETHRAL RESECTION OF BLADDER TUMOR (TURBT) WITH INSTILLATION OF POST OPERATIVE CHEMOTHERAPY;  Surgeon: Chad Rhodes, MD;  Location: [facility];  Service: Urology;  Laterality: N/A;  . VARICOSE VEIN SURGERY  1995     Home Medications:  Prior to Admission medications   Medication Sig Start Date End Date Taking? Authorizing Provider  aspirin EC 81 MG EC tablet Take 1 tablet (81 mg total) by mouth daily. 11/26/18  Yes Chad Girt, DO  clopidogrel (PLAVIX) 75 MG  tablet Take 1 tablet (75 mg total) by mouth daily. 02/05/19  Yes Barton Dubois, MD  diclofenac sodium (VOLTAREN) 1 % GEL Apply 1 application topically at bedtime as needed (pain.).   Yes [provider]  Dulaglutide (TRULICITY) 1.5 0000000 SOPN Inject 1.5 mg into the skin once a week.    Yes [provider]  Ensure Max Protein (ENSURE MAX PROTEIN) LIQD Take 330 mLs (11 oz total) by mouth daily. 04/26/19  Yes Sheikh, Omair Latif, DO  Evolocumab (REPATHA SURECLICK) [id number] MG/ML SOAJ Inject 1 pen into the skin every 14 (fourteen) days. 11/26/18  Yes Burnell Blanks, MD  Homeopathic Products [facility] GLOVE + FOAM EX) Apply 1 application topically 2 (two) times daily as needed (leg cramps).   Yes [provider]  HYDROcodone-acetaminophen (NORCO) 5-325 MG tablet Take 1 tablet by mouth every 6 (six) hours as needed for moderate pain. 05/03/19  Yes Shadad,  Mathis Dad, MD  insulin glargine (LANTUS) 100 UNIT/ML injection Inject 50 Units into the skin at bedtime.    Yes [provider]  insulin lispro (HUMALOG) 100 UNIT/ML KwikPen Inject 15 Units into the skin 2 (two) times daily. 09/19/18  Yes [provider]  isosorbide mononitrate (IMDUR) 120 MG 24 hr tablet Take 1 tablet (120 mg total) by mouth daily. 02/06/19  Yes Barton Dubois, MD  metoprolol tartrate (LOPRESSOR) 50 MG tablet Take 1 tablet (50 mg total) by mouth 2 (two) times daily. 01/02/19 05/11/2019 Yes Burtis Junes, NP  Multiple Vitamins-Minerals (MULTIVITAMIN WITH MINERALS) tablet Take 1 tablet by mouth daily.   Yes [provider]  nitroGLYCERIN (NITROSTAT) 0.4 MG SL tablet Place 1 tablet (0.4 mg total) under the tongue every 5 (five) minutes as needed for chest pain. 12/10/18 04/23/2019 Yes Burtis Junes, NP  ondansetron (ZOFRAN ODT) 4 MG disintegrating tablet Take 1 tablet (4 mg total) by mouth every 8 (eight) hours as needed for nausea or vomiting. 04/09/19  Yes Tanner, Lyndon Code., PA-C  Potassium  Citrate 15 MEQ (1620 MG) TBCR Take 1 tablet by mouth daily.  03/11/19  Yes [provider]  tetrahydrozoline 0.05 % ophthalmic solution Place 1 drop into both eyes at bedtime as needed (eye irritation).   Yes [provider]  amLODipine (NORVASC) 5 MG tablet Take 1 tablet (5 mg total) by mouth daily. Patient not taking: Reported on 05/02/2019 02/19/19   Imogene Burn, PA-C  pantoprazole (PROTONIX) 20 MG tablet Take 1 tablet (20 mg total) by mouth daily. Patient not taking: Reported on 04/30/2019 04/09/19 04/08/20  Harle Stanford., PA-C  fenofibrate (TRICOR) 145 MG tablet Take 145 mg by mouth daily.    08/02/18  [provider]    Inpatient Medications: Scheduled Meds: . sodium chloride flush  3 mL Intravenous Once   Continuous Infusions: . heparin 1,200 Units/hr (04/30/2019 1009)  . nitroGLYCERIN 45 mcg/min (05/14/2019 1213)   PRN Meds:   Allergies:    Allergies  Allergen Reactions  . Lisinopril Other (See Comments)    Severe cough Hair ball in throat  . Statins Hives and Other (See Comments)    Other reaction(s): Cough (ALLERGY/intolerance), Myalgias (intolerance) No cholesterol meds  . Atorvastatin Other (See Comments)    REACTION: myalgies  . Rosuvastatin Itching    Social History:   Social History   Socioeconomic History  . Marital status: Married    Spouse name: Not on file  . Number of children: 4  . Years of education: Not on file  . Highest education level: Not on file  Occupational History  . Occupation: [employer]    Employer: UNEMPLOYED  Tobacco Use  . Smoking status: Never Smoker  . Smokeless tobacco: Never Used  Substance and Sexual Activity  . Alcohol use: No  . Drug use: No  . Sexual activity: Not on file  Other Topics Concern  . Not on file  Social History Narrative   The patient lives in [place] with his wife. He works in Leisure centre manager. He denies tobacco or alcohol abuse . He denies  regular exercise.   Social Determinants of Health   Financial Resource Strain:   . Difficulty of Paying Living Expenses:   Food Insecurity:   . Worried About Charity fundraiser in the Last Year:   . Arboriculturist in the Last Year:   Transportation Needs:   . Film/video editor (Medical):   Marland Kitchen  Lack of Transportation (Non-Medical):   Physical Activity:   . Days of Exercise per Week:   . Minutes of Exercise per Session:   Stress:   . Feeling of Stress :   Social Connections:   . Frequency of Communication with Friends and Family:   . Frequency of Social Gatherings with Friends and Family:   . Attends Religious Services:   . Active Member of Clubs or Organizations:   . Attends Archivist Meetings:   Marland Kitchen Marital Status:   Intimate Partner Violence:   . Fear of Current or Ex-Partner:   . Emotionally Abused:   Marland Kitchen Physically Abused:   . Sexually Abused:     Family History:    Family History  Problem Relation Age of Onset  . Heart attack Father   . Hypertension Father   . Heart attack Brother      ROS:  Please see the history of present illness.   All other ROS reviewed and negative.     Physical Exam/Data:   Vitals:   04/25/2019 1230 04/26/2019 1300 05/11/2019 1330 05/08/2019 1400  BP: 108/73 104/64 109/69 111/73  Pulse: 96 88 71 87  Resp: (!) 21 18 18 19   Temp:      TempSrc:      SpO2: 95% 94% 97% 95%  Weight:      Height:        Intake/Output Summary (Last 24 hours) at 04/19/2019 1407 Last data filed at 04/20/2019 1131 Gross per 24 hour  Intake 1000 ml  Output --  Net 1000 ml   Last 3 Weights 05/14/2019 04/23/2019 04/21/2019  Weight (lbs) 230 lb 212 lb 1.3 oz 212 lb 1.3 oz  Weight (kg) 104.327 kg 96.2 kg 96.2 kg     Body mass index is 31.19 kg/m.  General:  Well nourished, well developed, in no acute distress, jaundiced skin and sclera  HEENT: normal Neck: no JVD Endocrine:  No thryomegaly Vascular: No carotid bruits Cardiac:  normal S1, S2; RRR; no  murmur  Lungs:  clear to auscultation bilaterally, no wheezing, rhonchi or rales  Abd: soft, nontender, no hepatomegaly  Ext: trace B LE edema Musculoskeletal:  No deformities, BUE and BLE strength normal and equal Skin: warm and dry  Neuro:  CNs 2-12 intact, no focal abnormalities noted Psych:  Normal affect   EKG:  The EKG was personally reviewed and demonstrates:  Sinus rhythm in the 90s, poor R wave progression with mild ST depression inferior and lateral leads, artifact difficult to interpret ST elevation in V2 - will obtain repeat EKG Telemetry:  Telemetry was personally reviewed and demonstrates:  Sinus rhythm in the 80s  Relevant CV Studies:  Echo 03/19/19: 1. Left ventricular ejection fraction, by estimation, is 55 to 60%. The  left ventricle has normal function. The left ventricle has no regional  wall motion abnormalities. There is moderate left ventricular hypertrophy.  Left ventricular diastolic  parameters are consistent with Grade I diastolic dysfunction (impaired  relaxation).    Echo 02/04/19: 1. Left ventricular ejection fraction, by visual estimation, is 45 to  50%. The left ventricle has normal function. There is mildly increased  left ventricular hypertrophy.  2. Moderate hypokinesis of the left ventricular, mid-apical anterior  wall.  3. Indeterminate diastolic filling due to E-A fusion.  4. The left ventricle demonstrates regional wall motion abnormalities.  5. Global right ventricle has normal systolic function.The right  ventricular size is normal. No increase in right ventricular wall  thickness.  6. Left atrial size was normal.  7. Right atrial size was normal.  8. The mitral valve is normal in structure. Mild mitral valve  regurgitation. No evidence of mitral stenosis.  9. The tricuspid valve is normal in structure.  10. The aortic valve is normal in structure. Aortic valve regurgitation is  not visualized. Mild aortic valve sclerosis without  stenosis.  11. The pulmonic valve was normal in structure. Pulmonic valve  regurgitation is not visualized.  12. Moderately elevated pulmonary artery systolic pressure.  13. The inferior vena cava is dilated in size with <50% respiratory  variability, suggesting right atrial pressure of 15 mmHg.  14. The left ventricular function has worsened.  15. Prior images reviewed side by side.    Nuclear stress test 01/27/2015  Nuclear stress EF: 62%.  There was no ST segment deviation noted during stress.  The study is normal.  The left ventricular ejection fraction is normal (55-65%).  1. No ischemia or infarction by perfusion imaging.  2. Normal LV systolic function and wall motion.    Laboratory Data:  High Sensitivity Troponin:   Recent Labs  Lab 04/20/19 2114 04/20/19 2335 04/24/2019 0753 04/29/2019 0932  TROPONINIHS 21* 16 415* 437*     Chemistry Recent Labs  Lab 05/03/19 1209 05/13/2019 0753  NA 137 132*  K 4.7 5.6*  CL 103 98  CO2 18* 18*  GLUCOSE 85 121*  BUN 39* 47*  CREATININE 2.19* 2.65*  CALCIUM 9.0 9.3  GFRNONAA 30* 24*  GFRAA 34* 27*  ANIONGAP 16* 16*    Recent Labs  Lab 05/03/19 1209  PROT 6.9  ALBUMIN 2.4*  AST 153*  ALT 106*  ALKPHOS 570*  BILITOT 2.8*   Hematology Recent Labs  Lab 05/03/19 1209 05/14/2019 0753  WBC 5.3 6.2  RBC 3.06* 3.17*  HGB 9.1* 9.5*  HCT 30.0* 31.6*  MCV 98.0 99.7  MCH 29.7 30.0  MCHC 30.3 30.1  RDW 15.9* 17.6*  PLT 188 200   BNPNo results for input(s): BNP, PROBNP in the last 168 hours.  DDimer No results for input(s): DDIMER in the last 168 hours.   Radiology/Studies:  CT ABDOMEN PELVIS WO CONTRAST  Result Date: 05/09/2019 CLINICAL DATA:  Metastatic bladder cancer restaging EXAM: CT ABDOMEN AND PELVIS WITHOUT CONTRAST TECHNIQUE: Multidetector CT imaging of the abdomen and pelvis was performed following the standard protocol without IV contrast. COMPARISON:  Multiple exams, including 03/11/2019 FINDINGS:  Lower chest: Mild atelectasis along the right hemidiaphragm. Hepatobiliary: Hypodense heterogeneity in the liver favoring progressive malignancy given the extent of the hypodensity on today's exam compared to prior exams. For example, a lesion in the dome of the right hepatic lobe measures about 8.9 by 7.2 cm, previously about 3.6 by 3.9 cm on 03/11/2019. High density in the gallbladder suggesting sludge. Pancreas: Unremarkable Spleen: Unremarkable Adrenals/Urinary Tract: Adrenal glands normal. Left double-J ureteral stent in place with resolution of the prior hydronephrosis. Tumor along the left posterior bladder wall noted, thickness about 1.7 cm today, previously about 1.4 cm on 03/11/2019. There is extravesical soft tissue tumor adjacent to the bladder in the UVJ with the extravesical component measuring 3.3 by 1.8 cm, previously 3.0 by 1.8 cm. This tumor abuts the left seminal vesicle possibly the left side of the prostate gland. Nonobstructive bilateral nephrolithiasis. Stomach/Bowel: Unremarkable Vascular/Lymphatic: Aortoiliac atherosclerotic vascular disease. Increasing adenopathy in the porta hepatis and retroperitoneum. Index porta hepatis node 1.6 cm in short axis on image 32/2, formerly 1.2 cm. Index left retroperitoneal node  1.8 cm in short axis on image 45/2, previously 0.7 cm. Left obturator node 1.2 cm in short axis on image 76/2, formerly 0.4 cm. A separate left external iliac node measures 1.5 cm in short axis on image 74/2, previously 1.3 cm by my measurements. Reproductive: As noted above, extravesical tumor along the left side of the urinary bladder abuts the left seminal vesicle and possibly the left side of the prostate gland. Other: New small amount of perihepatic ascites and trace perisplenic ascites. Small amount of pelvic ascites noted. Musculoskeletal: Increase in size of the lytic bone lesion in the ischium with some early cortical breakthrough anteriorly and laterally, lesion measures  2.9 by 2.5 cm on image 91/2 (formerly 1.9 by 1.4 cm.). Increase conspicuity of 0.9 cm lytic lesion in the left iliac bone, image 68/2. Postoperative findings in the lumbar spine with transitional S1 segment and PLIF at L4-L5-S1. Anterolisthesis at L4-5, fused in position. IMPRESSION: 1. Overall worsening malignancy with increase in size of hepatic metastatic lesions; worsening abdominal and pelvic adenopathy; increase in tumor along the left side of the urinary bladder potentially with involvement of the seminal vesicle/prostate; and increase in size of the lytic bone lesions in the left iliac bone and ischium. 2. Left double-J ureteral stent with resolution of the prior hydronephrosis. 3. Nonobstructive bilateral nephrolithiasis. 4. New small amount of perihepatic ascites and trace perisplenic ascites. Small amount of pelvic ascites. 5. Aortic atherosclerosis. Aortic Atherosclerosis (ICD10-I70.0). Electronically Signed   By: Van Clines M.D.   On: 04/20/2019 08:12   DG Chest 2 View  Result Date: 05/14/2019 CLINICAL DATA:  Right-sided chest pain. EXAM: CHEST - 2 VIEW COMPARISON:  02/03/2019 FINDINGS: Moderate eventration of the right hemidiaphragm with overlying vascular crowding and streaky atelectasis. No infiltrates or effusions. The cardiac silhouette, mediastinal and hilar contours are within normal limits and stable. Mild calcification of the thoracic aorta. The bony thorax is intact. IMPRESSION: No acute cardiopulmonary findings. Electronically Signed   By: Marijo Sanes M.D.   On: 05/02/2019 08:31       TIMI Risk Score for Unstable Angina or Non-ST Elevation MI:   The patient's TIMI risk score is 6, which indicates a 41% risk of all cause mortality, new or recurrent myocardial infarction or need for urgent revascularization in the next 14 days.   Assessment and Plan:   CAD NSTEMI Hx of mildly reduced EF (45-50%), normalized on most recent echo - hs troponin 415 --> 437 - EKG with left  axis deviation and poor R wave progression (previously wit anterolateral infarct pattern) - pt describes symptoms concerning for angina, relieved with nitro - imdur was previously titrated to 120 mg, 5 mg amlodipine, BB - currently on a nitro drip - heparin drip running - follow CBC - continue ASA and plavix - given his renal disease and metastatic cancer, would continue to optimize medical therapy with the addition of ranexa, increase amlodipine to 10 mg - continue heparin drip x 48 hr - attempt to wean nitro drip - will obtain echo to evaluate EF and wall motion - can consider nuclear stress testing to risk stratify if echo is abnormal - if continued chest pain, can consider ranexa   CKD  Hyperkalemia Dehydration , vomiting following CT contrast - sCr 2.65 with K 5.6 - baseline creatinine appears to be 1.8-2   Anemia - no evidence of bleeding - Hb 9.5, PLT 200   Hypertension - medications as above   Hyperlipidemia - continue repatha  Metastatic bladder cancer with liver involvement - patient is visibly jaundiced, he states is baseline - appreciate oncology input regarding prognosis following CT imaging yesterday   Difficult situation in a patient with known coronary artery disease with multiple presentations for unstable angina and NSTEMI. History sounds consistent with demand ischemia in the setting of dehydration. Past episodes have been related to AoCKD and anemia. I had a discussion with him regarding risk of progression of renal disease should we pursue angiography. He agrees with optimizing medical therapy for now. Continue heparin x 48 hrs. Wean nitro drip. Will obtain echo. Admit to medicine service for hydration, electrolyte imbalances, and to follow anemia. Would appreciate input from oncology regarding his CT scan yesterday and appropriateness of palliative medicine consult.      For questions or updates, please contact [place] Please consult  [email or web address] for contact info under     Signed, Ledora Bottcher, [place]  04/30/2019 2:07 PM   Patient seen and examined.  Agree with above documentation.  Patient is a 69 year old male with history of CAD status post PCI to diagonal (last cath in 2012 showed patent stent but subtotal occlusion prior to stent, no intervention), CKD stage III, stage IV bladder cancer with liver metastasis, diabetes, hypertension who is being seen today for evaluation of chest pain at the request of Chad Yu.  He has had multiple recent admissions.  He was admitted to [facility] in November 2020 with chest pain suspected to be demand ischemia in setting of anemia and CKD.  TTE at that time showed normal EF.  Medical management was recommended, and his Imdur was increased to 60 mg and Lopressor to 50 mg twice daily.  He was hospitalized again in January 2020 with exertional chest pain.  Found to have worsening renal function and anemia.  Chest pain improved with transfusions for his anemia.  TTE at that point showed EF 45 to 50%.  However, repeat echo in March 2021 showed EF had normalized.  Had another admission this month with exertional chest pain.  Found to have worsening renal function from left ureteral obstruction due to metastatic bladder cancer.  Ureteral stent was placed.  His Imdur was titrated up to 120 mg at that time.  He reports that yesterday he underwent a CT scan at [facility] for follow-up of his malignancy.  States that after receiving p.o. contrast for the scan, he had nausea and vomiting.  States that he vomited all night.  Around midnight he started having chest pain.  Would improve with nitroglycerin but eventually worsened to the point that he was having 10 out of 10 chest pain.  This prompted him to come into the ED.  In the ED, he was afebrile, pulse 87, BP 121/71, SPO2 99% on room air.  Labs notable for creatinine 2.65 (from 2.19 on 4/16), K 5.6, hemoglobin 9.5, platelets 200, high-sensitivity troponin  415 > 437.  Initial EKG shows normal sinus rhythm, rate 91, left axis deviation, nonspecific T wave flattening and sub-1 mm ST depressions.  Telemetry shows normal sinus rhythm with rates 90s to 100s, PVCs.  He was given a 1 L normal saline bolus.  Started on heparin drip and nitroglycerin drip.  Nitro drip at 45 mcg/min, and he currently reports that he is chest pain-free.  For his NSTEMI, may represent demand ischemia in the setting of dehydration from persistent vomiting overnight.  Will check TTE to evaluate for new wall motion abnormality.  Considering his stage IV bladder  cancer with progression on CT scan yesterday, and his worsening renal function, is not a good candidate for invasive evaluation.  Discussed with patient, and he is not interested in heart catheterization due to risk of worsening his renal function.  Recommend medical management.  Hemoglobin and platelets appear stable, would continue IV heparin for planned 48 hours.  He is currently pain-free on nitro drip, will wean drip as tolerated.  We will continue aspirin, Plavix, beta-blocker, imdur.  He is on Repatha due to inability to tolerate statins.  Given results of CT scan yesterday, would recommend discussion with oncology about prognosis/goals of care, and consideration of palliative medicine involvement if appropriate.  Donato Heinz, MD

## 2019-05-09 NOTE — Progress Notes (Signed)
ANTICOAGULATION CONSULT NOTE - Initial Consult  Pharmacy Consult for Heparin Indication: chest pain/ACS  Allergies  Allergen Reactions  . Lisinopril Other (See Comments)    Severe cough Hair ball in throat  . Statins Hives and Other (See Comments)    Other reaction(s): Cough (ALLERGY/intolerance), Myalgias (intolerance) No cholesterol meds  . Atorvastatin Other (See Comments)    REACTION: myalgies  . Rosuvastatin Itching    Patient Measurements: Height: 6' (182.9 cm) Weight: 104.3 kg (230 lb) IBW/kg (Calculated) : 77.6 Heparin Dosing Weight: 98 kg  Vital Signs: Temp: 98 F (36.7 C) (04/22 0744) Temp Source: Oral (04/22 0744) BP: 97/69 (04/22 1730) Pulse Rate: 100 (04/22 1830)  Labs: Recent Labs    04/24/2019 0753 04/29/2019 0932 05/16/2019 1800  HGB 9.5*  --   --   HCT 31.6*  --   --   PLT 200  --   --   HEPARINUNFRC  --   --  0.15*  CREATININE 2.65*  --   --   TROPONINIHS 415* 437*  --     Estimated Creatinine Clearance: 32.9 mL/min (A) (by C-G formula based on SCr of 2.65 mg/dL (H)).   Medical History: Past Medical History:  Diagnosis Date  . Bladder tumor   . Cancer (HCC)    BLADDER CANCER  . Coronary artery disease cardiologist-- dr [name] (per pt last office visit 10/ 2018)   s/p  PCI w/ DES x1 to diagonal branch of LAD  . DDD (degenerative disc disease), lumbar   . Degenerative joint disease   . History of concussion    per pt age 28 w/ brief loc--- no residual  . Hyperlipidemia   . Hypertension   . OA (osteoarthritis)   . S/P drug eluting coronary stent placement 10/24/2007   diagonal branch of LAD  . Type 2 diabetes mellitus treated with insulin ([place])    followed by pcp  . Varicose vein of leg   . Wears glasses     Medications:  (Not in a hospital admission)   Assessment: 69 YOM who presents with chest pain and elevated troponins. Pharmacy consulted to start IV heparin for ACS. H/H low, Plt wnl. SCr 2.65 (seems to be baseline for  patient).   Initial HL subtherapeutic at 0.15 on drip rate 1200 units/hr. No overt bleeding or infusion issues noted.  Goal of Therapy:  Heparin level 0.3-0.7 units/ml Monitor platelets by anticoagulation protocol: Yes   Plan:  - Rebolus heparin 3000 units IV once - Increase heparin infusion to 1500 units/hr - Fu 8 hr HL - Monitor daily HL, CBC and s/s of bleeding  Richardine Service, PharmD PGY1 Pharmacy Resident Phone: [phone] 05/16/2019  7:21 PM  Please check [email or web address] for unit-specific pharmacy phone numbers.

## 2019-05-09 NOTE — ED Provider Notes (Signed)
[facility] EMERGENCY DEPARTMENT Provider Note    CSN: [id number] Arrival date & time: 04/28/2019  0732     History Chief Complaint  Patient presents with  . Chest Pain    Chad Yu is a 69 y.o. 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  He reports onset approximately 2 to 3 days ago.  That is a persistent substernal chest pain he feels nauseated has had several days of vomiting.  He had a CT scan done at [facility] yesterday as part of his cancer screening yesterday and said he threw up while getting a CT scan.  He has taken 2 SL nitro's yesterday with relief for "about 15 minutes, then the pain comes back."  He says it feels similar to his NSTEMI back in January.  Just admitted for AKI in April 2021 w/ noted hyperkalemia and blood loss anemia requiring transfusions.    Chest pain regimen is 81 mg aspirin and 75 mg plavix daily, metoprolol 50 mg BID, isosorbide 120 mg daily, nitro SL as needed, no ace/ARB in setting of kidney disease  CT scan abdomen yesterday impression:  IMPRESSION: 1. Overall worsening malignancy with increase in size of hepatic metastatic lesions; worsening abdominal and pelvic adenopathy; increase in tumor along the left side of the urinary bladder potentially with involvement of the seminal vesicle/prostate; and increase in size of the lytic bone lesions in the left iliac bone and ischium. 2. Left double-J ureteral stent with resolution of the prior hydronephrosis. 3. Nonobstructive bilateral nephrolithiasis. 4. New small amount of perihepatic ascites and trace perisplenic ascites. Small amount of pelvic ascites. 5. Aortic atherosclerosis.  HPI     Past Medical History:  Diagnosis Date  .  Bladder tumor   . Cancer (HCC)    BLADDER CANCER  . Coronary artery disease cardiologist-- dr [name] (per pt last office visit 10/ 2018)   s/p  PCI w/ DES x1 to diagonal branch of LAD  . DDD (degenerative disc disease), lumbar   . Degenerative joint disease   . History of concussion    per pt age 80 w/ brief loc--- no residual  . Hyperlipidemia   . Hypertension   . OA (osteoarthritis)   . S/P drug eluting coronary stent placement 10/24/2007   diagonal branch of LAD  . Type 2 diabetes mellitus treated with insulin ([place])    followed by pcp  . Varicose vein of leg   . Wears glasses     Patient Active Problem List   Diagnosis Date Noted  . AKI (acute kidney injury) ([place]) 04/20/2019  . Anemia associated with chemotherapy 04/20/2019  . Hyponatremia 04/20/2019  . Acute systolic HF (heart failure) ([place])   . Acute renal failure superimposed on stage 3b chronic kidney disease ([place])   . Symptomatic anemia 02/04/2019  . Thrombocytopenia ([place]) 02/04/2019  . Renal insufficiency 02/04/2019  . NSTEMI (non-ST elevated myocardial infarction) ([place]) 02/03/2019  . Statin myopathy 01/10/2019  . Unstable angina ([place]) 11/23/2018  . Chest pain 11/22/2018  . Goals of care, counseling/discussion 09/19/2018  . Bladder cancer ([place]) 09/19/2018  . Primary localized osteoarthritis of left knee 02/17/2016  . Bradycardia 12/16/2010  . DIZZINESS 04/27/2009  . CHEST PAIN 04/27/2009  . ABNORMAL ELECTROCARDIOGRAM 04/27/2009  . ABDOMINAL PAIN-RUQ 10/30/2008  .  GASTRITIS 06/13/2008  . GERD 06/05/2008  . SWELLING MASS OR LUMP IN HEAD AND NECK 06/05/2008  . COUGH 06/05/2008  . HEMORRHOIDS 06/04/2008  . CAD, NATIVE VESSEL 04/09/2008  . Type 2 diabetes with nephropathy ([place]) 04/07/2008  . Hyperlipidemia 04/07/2008  . Essential hypertension 04/07/2008  . FATIGUE / MALAISE 04/07/2008  . DYSPNEA 04/07/2008    Past Surgical History:  Procedure Laterality Date  . APPENDECTOMY  age 14  . COLONOSCOPY  2012   . CORONARY ANGIOPLASTY  08-26-2010   dr [name]   previous stent patent,  proximal prior to previous stent 99% stenosis;  Unsuccessful multiple attempts PCI, unability to cross with a ballon;  medical management  . CORONARY ANGIOPLASTY WITH STENT PLACEMENT  10-23-2017  dr Darnell Level brodie   positive ischemia on myoview;   40% pLAD and D1 80%,  diagonal branch treated w/ PCI and DES   . CYSTOSCOPY W/ URETERAL STENT PLACEMENT Left 04/23/2019   Procedure: CYSTOSCOPY WITH RETROGRADE PYELOGRAM/URETERAL STENT PLACEMENT;  Surgeon: Ardis Hughs, MD;  Location: [facility];  Service: Urology;  Laterality: Left;  . EXCISION MIDLINE SUPERIOR THYOID ISTHMUS MASS  11-27-2008   dr Ernesto Rutherford   bening lipoma  . KNEE ARTHROSCOPY Left 01-22-2004   dr Percell Miller  . POSTERIOR LUMBAR FUSION  11-14-2012   dr Christella Noa @[facility]    L3 -- 5  . SHOULDER ARTHROSCOPY Right 2019  . SHOULDER ARTHROSCOPY WITH ROTATOR CUFF REPAIR AND SUBACROMIAL DECOMPRESSION Left 11-12-2009   dr Percell Miller  @[facility]    w/ Labral debridement and DCR  . TONSILLECTOMY  age 48  . TOTAL KNEE ARTHROPLASTY Left 02/17/2016   Procedure: TOTAL KNEE ARTHROPLASTY;  Surgeon: Ninetta Lights, MD;  Location: [place];  Service: Orthopedics;  Laterality: Left;  . TOTAL KNEE ARTHROPLASTY Right 02-25-2004   dr Percell Miller  @[facility]   . TRANSURETHRAL RESECTION OF BLADDER TUMOR N/A 08/31/2018   Procedure: TRANSURETHRAL RESECTION OF BLADDER TUMOR (TURBT) WITH INSTILLATION OF POST OPERATIVE CHEMOTHERAPY;  Surgeon: Kathie Rhodes, MD;  Location: [facility];  Service: Urology;  Laterality: N/A;  . VARICOSE VEIN SURGERY  1995       Family History  Problem Relation Age of Onset  . Heart attack Father   . Hypertension Father   . Heart attack Brother     Social History   Tobacco Use  . Smoking status: Never Smoker  . Smokeless tobacco: Never Used  Substance Use Topics  . Alcohol use: No  . Drug use: No    Home Medications Prior to Admission medications   Medication Sig Start Date End Date  Taking? Authorizing Provider  aspirin EC 81 MG EC tablet Take 1 tablet (81 mg total) by mouth daily. 11/26/18  Yes Geradine Girt, DO  clopidogrel (PLAVIX) 75 MG tablet Take 1 tablet (75 mg total) by mouth daily. 02/05/19  Yes Barton Dubois, MD  diclofenac sodium (VOLTAREN) 1 % GEL Apply 1 application topically at bedtime as needed (pain.).   Yes [provider]  Dulaglutide (TRULICITY) 1.5 0000000 SOPN Inject 1.5 mg into the skin once a week.    Yes [provider]  Ensure Max Protein (ENSURE MAX PROTEIN) LIQD Take 330 mLs (11 oz total) by mouth daily. 04/26/19  Yes Sheikh, Omair Latif, DO  Evolocumab (REPATHA SURECLICK) [id number] MG/ML SOAJ Inject 1 pen into the skin every 14 (fourteen) days. 11/26/18  Yes Burnell Blanks, MD  Homeopathic Products [facility] GLOVE + FOAM EX) Apply 1 application topically 2 (two) times daily as needed (leg  cramps).   Yes [provider]  HYDROcodone-acetaminophen (NORCO) 5-325 MG tablet Take 1 tablet by mouth every 6 (six) hours as needed for moderate pain. 05/03/19  Yes Wyatt Portela, MD  insulin glargine (LANTUS) 100 UNIT/ML injection Inject 50 Units into the skin at bedtime.    Yes [provider]  insulin lispro (HUMALOG) 100 UNIT/ML KwikPen Inject 15 Units into the skin 2 (two) times daily. 09/19/18  Yes [provider]  isosorbide mononitrate (IMDUR) 120 MG 24 hr tablet Take 1 tablet (120 mg total) by mouth daily. 02/06/19  Yes Barton Dubois, MD  metoprolol tartrate (LOPRESSOR) 50 MG tablet Take 1 tablet (50 mg total) by mouth 2 (two) times daily. 01/02/19 04/28/2019 Yes Burtis Junes, NP  Multiple Vitamins-Minerals (MULTIVITAMIN WITH MINERALS) tablet Take 1 tablet by mouth daily.   Yes [provider]  nitroGLYCERIN (NITROSTAT) 0.4 MG SL tablet Place 1 tablet (0.4 mg total) under the tongue every 5 (five) minutes as needed for chest pain. 12/10/18 05/01/2019 Yes Burtis Junes, NP  ondansetron (ZOFRAN ODT) 4  MG disintegrating tablet Take 1 tablet (4 mg total) by mouth every 8 (eight) hours as needed for nausea or vomiting. 04/09/19  Yes Tanner, Lyndon Code., PA-C  Potassium Citrate 15 MEQ (1620 MG) TBCR Take 1 tablet by mouth daily.  03/11/19  Yes [provider]  tetrahydrozoline 0.05 % ophthalmic solution Place 1 drop into both eyes at bedtime as needed (eye irritation).   Yes [provider]  amLODipine (NORVASC) 5 MG tablet Take 1 tablet (5 mg total) by mouth daily. Patient not taking: Reported on 04/19/2019 02/19/19   Imogene Burn, PA-C  pantoprazole (PROTONIX) 20 MG tablet Take 1 tablet (20 mg total) by mouth daily. Patient not taking: Reported on 05/12/2019 04/09/19 04/08/20  Harle Stanford., PA-C  fenofibrate (TRICOR) 145 MG tablet Take 145 mg by mouth daily.    08/02/18  [provider]    Allergies    Lisinopril, Statins, Atorvastatin, and Rosuvastatin  Review of Systems   Review of Systems  Constitutional: Negative for chills and fever.  HENT: Negative for ear pain and sore throat.   Eyes: Negative for pain and visual disturbance.  Respiratory: Positive for shortness of breath. Negative for cough.   Cardiovascular: Positive for chest pain and leg swelling.  Gastrointestinal: Positive for abdominal distention, nausea and vomiting. Negative for abdominal pain.  Genitourinary: Negative for dysuria and hematuria.  Musculoskeletal: Negative for arthralgias and back pain.  Skin: Negative for color change and rash.  Neurological: Negative for syncope and numbness.  Psychiatric/Behavioral: Negative for agitation and confusion.  All other systems reviewed and are negative.   Physical Exam Updated Vital Signs BP 97/69   Pulse 92   Temp 98 F (36.7 C) (Oral)   Resp (!) 22   Ht 6' (1.829 m)   Wt 104.3 kg   SpO2 94%   BMI 31.19 kg/m   Physical Exam Vitals and nursing note reviewed.  Constitutional:      Appearance: He is well-developed. He is obese. He is  ill-appearing.  HENT:     Head: Normocephalic and atraumatic.  Eyes:     Conjunctiva/sclera: Conjunctivae normal.     Pupils: Pupils are equal, round, and reactive to light.  Cardiovascular:     Rate and Rhythm: Normal rate and regular rhythm.  Pulmonary:     Effort: Pulmonary effort is normal. No respiratory distress.  Abdominal:     Palpations: Abdomen  is soft.     Tenderness: There is no abdominal tenderness.  Musculoskeletal:     Cervical back: Neck supple.  Skin:    General: Skin is warm and dry.  Neurological:     General: No focal deficit present.     Mental Status: He is alert and oriented to person, place, and time.     ED Results / Procedures / Treatments   Labs (all labs ordered are listed, but only abnormal results are displayed) Labs Reviewed  BASIC METABOLIC PANEL - Abnormal; Notable for the following components:      Result Value   Sodium 132 (*)    Potassium 5.6 (*)    CO2 18 (*)    Glucose, Bld 121 (*)    BUN 47 (*)    Creatinine, Ser 2.65 (*)    GFR calc non Af Amer 24 (*)    GFR calc Af Amer 27 (*)    Anion gap 16 (*)    All other components within normal limits  CBC - Abnormal; Notable for the following components:   RBC 3.17 (*)    Hemoglobin 9.5 (*)    HCT 31.6 (*)    RDW 17.6 (*)    All other components within normal limits  URINALYSIS, ROUTINE W REFLEX MICROSCOPIC - Abnormal; Notable for the following components:   Color, Urine YELLOW (*)    APPearance CLOUDY (*)    Glucose, UA   (*)    Value: TEST NOT REPORTED DUE TO COLOR INTERFERENCE OF URINE PIGMENT   Hgb urine dipstick   (*)    Value: TEST NOT REPORTED DUE TO COLOR INTERFERENCE OF URINE PIGMENT   Bilirubin Urine   (*)    Value: TEST NOT REPORTED DUE TO COLOR INTERFERENCE OF URINE PIGMENT   Ketones, ur   (*)    Value: TEST NOT REPORTED DUE TO COLOR INTERFERENCE OF URINE PIGMENT   Protein, ur   (*)    Value: TEST NOT REPORTED DUE TO COLOR INTERFERENCE OF URINE PIGMENT   Nitrite    (*)    Value: TEST NOT REPORTED DUE TO COLOR INTERFERENCE OF URINE PIGMENT   Leukocytes,Ua   (*)    Value: TEST NOT REPORTED DUE TO COLOR INTERFERENCE OF URINE PIGMENT   All other components within normal limits  URINALYSIS, MICROSCOPIC (REFLEX) - Abnormal; Notable for the following components:   Bacteria, UA FEW (*)    All other components within normal limits  TROPONIN I (HIGH SENSITIVITY) - Abnormal; Notable for the following components:   Troponin I (High Sensitivity) 415 (*)    All other components within normal limits  TROPONIN I (HIGH SENSITIVITY) - Abnormal; Notable for the following components:   Troponin I (High Sensitivity) 437 (*)    All other components within normal limits  RESPIRATORY PANEL BY RT PCR (FLU A&B, COVID)  NA AND K (SODIUM & POTASSIUM), RAND UR  CREATININE, URINE, RANDOM  HEPARIN LEVEL (UNFRACTIONATED)  HEPARIN LEVEL (UNFRACTIONATED)  CBC  COMPREHENSIVE METABOLIC PANEL  TYPE AND SCREEN    EKG EKG Interpretation  Date/Time:  Thursday May 09 2019 07:36:45 EDT Ventricular Rate:  91 PR Interval:  194 QRS Duration: 88 QT Interval:  376 QTC Calculation: 462 R Axis:   -31 Text Interpretation: Normal sinus rhythm Left axis deviation Minimal voltage criteria for LVH, may be normal variant ( R in aVL ) Nonspecific ST and T wave abnormality Abnormal ECG No evident STEMI, poor baseline in V1, otherwise no sig  change form April 20 2019 ecg Confirmed by Octaviano Glow [phone]) on 04/26/2019 9:03:31 AM   Radiology CT ABDOMEN PELVIS WO CONTRAST  Result Date: 05/05/2019 CLINICAL DATA:  Metastatic bladder cancer restaging EXAM: CT ABDOMEN AND PELVIS WITHOUT CONTRAST TECHNIQUE: Multidetector CT imaging of the abdomen and pelvis was performed following the standard protocol without IV contrast. COMPARISON:  Multiple exams, including 03/11/2019 FINDINGS: Lower chest: Mild atelectasis along the right hemidiaphragm. Hepatobiliary: Hypodense heterogeneity in the liver  favoring progressive malignancy given the extent of the hypodensity on today's exam compared to prior exams. For example, a lesion in the dome of the right hepatic lobe measures about 8.9 by 7.2 cm, previously about 3.6 by 3.9 cm on 03/11/2019. High density in the gallbladder suggesting sludge. Pancreas: Unremarkable Spleen: Unremarkable Adrenals/Urinary Tract: Adrenal glands normal. Left double-J ureteral stent in place with resolution of the prior hydronephrosis. Tumor along the left posterior bladder wall noted, thickness about 1.7 cm today, previously about 1.4 cm on 03/11/2019. There is extravesical soft tissue tumor adjacent to the bladder in the UVJ with the extravesical component measuring 3.3 by 1.8 cm, previously 3.0 by 1.8 cm. This tumor abuts the left seminal vesicle possibly the left side of the prostate gland. Nonobstructive bilateral nephrolithiasis. Stomach/Bowel: Unremarkable Vascular/Lymphatic: Aortoiliac atherosclerotic vascular disease. Increasing adenopathy in the porta hepatis and retroperitoneum. Index porta hepatis node 1.6 cm in short axis on image 32/2, formerly 1.2 cm. Index left retroperitoneal node 1.8 cm in short axis on image 45/2, previously 0.7 cm. Left obturator node 1.2 cm in short axis on image 76/2, formerly 0.4 cm. A separate left external iliac node measures 1.5 cm in short axis on image 74/2, previously 1.3 cm by my measurements. Reproductive: As noted above, extravesical tumor along the left side of the urinary bladder abuts the left seminal vesicle and possibly the left side of the prostate gland. Other: New small amount of perihepatic ascites and trace perisplenic ascites. Small amount of pelvic ascites noted. Musculoskeletal: Increase in size of the lytic bone lesion in the ischium with some early cortical breakthrough anteriorly and laterally, lesion measures 2.9 by 2.5 cm on image 91/2 (formerly 1.9 by 1.4 cm.). Increase conspicuity of 0.9 cm lytic lesion in the left  iliac bone, image 68/2. Postoperative findings in the lumbar spine with transitional S1 segment and PLIF at L4-L5-S1. Anterolisthesis at L4-5, fused in position. IMPRESSION: 1. Overall worsening malignancy with increase in size of hepatic metastatic lesions; worsening abdominal and pelvic adenopathy; increase in tumor along the left side of the urinary bladder potentially with involvement of the seminal vesicle/prostate; and increase in size of the lytic bone lesions in the left iliac bone and ischium. 2. Left double-J ureteral stent with resolution of the prior hydronephrosis. 3. Nonobstructive bilateral nephrolithiasis. 4. New small amount of perihepatic ascites and trace perisplenic ascites. Small amount of pelvic ascites. 5. Aortic atherosclerosis. Aortic Atherosclerosis (ICD10-I70.0). Electronically Signed   By: Van Clines M.D.   On: 04/24/2019 08:12   DG Chest 2 View  Result Date: 05/17/2019 CLINICAL DATA:  Right-sided chest pain. EXAM: CHEST - 2 VIEW COMPARISON:  02/03/2019 FINDINGS: Moderate eventration of the right hemidiaphragm with overlying vascular crowding and streaky atelectasis. No infiltrates or effusions. The cardiac silhouette, mediastinal and hilar contours are within normal limits and stable. Mild calcification of the thoracic aorta. The bony thorax is intact. IMPRESSION: No acute cardiopulmonary findings. Electronically Signed   By: Marijo Sanes M.D.   On: 05/11/2019 08:31  Procedures .Critical Care Performed by: Wyvonnia Dusky, MD Authorized by: Wyvonnia Dusky, MD   Critical care provider statement:    Critical care time (minutes):  65   Critical care was necessary to treat or prevent imminent or life-threatening deterioration of the following conditions:  Cardiac failure   Critical care was time spent personally by me on the following activities:  Discussions with consultants, evaluation of patient's response to treatment, examination of patient, ordering and  performing treatments and interventions, ordering and review of laboratory studies, ordering and review of radiographic studies, pulse oximetry, re-evaluation of patient's condition, obtaining history from patient or surrogate and review of old charts Comments:     NSTEMI requiring IV heparin, IV nitroglycerin, repeat ECG interpretation, discussion with cardiology consultants   (including critical care time)  Medications Ordered in ED Medications  sodium chloride flush (NS) 0.9 % injection 3 mL (3 mLs Intravenous Not Given 04/18/2019 0920)  nitroGLYCERIN 50 mg in dextrose 5 % 250 mL (0.2 mg/mL) infusion (45 mcg/min Intravenous Rate/Dose Change 04/24/2019 1213)  heparin ADULT infusion 100 units/mL (25000 units/295mL sodium chloride 0.45%) (1,200 Units/hr Intravenous New Bag/Given 05/05/2019 1009)  HYDROcodone-acetaminophen (NORCO/VICODIN) 5-325 MG per tablet 1 tablet (has no administration in time range)  acetaminophen (TYLENOL) tablet 650 mg (has no administration in time range)  ondansetron (ZOFRAN) injection 4 mg (has no administration in time range)  heparin bolus via infusion 4,000 Units (4,000 Units Intravenous Bolus from Bag 05/03/2019 1010)  sodium chloride 0.9 % bolus 1,000 mL (0 mLs Intravenous Stopped 04/27/2019 1131)  acetaminophen (TYLENOL) tablet 650 mg (650 mg Oral Given 05/02/2019 1110)  morphine 4 MG/ML injection 4 mg (4 mg Intravenous Given 04/21/2019 1156)  oxyCODONE-acetaminophen (PERCOCET/ROXICET) 5-325 MG per tablet 1 tablet (1 tablet Oral Given 05/09/2019 1612)    ED Course  I have reviewed the triage vital signs and the nursing notes.  Pertinent labs & imaging results that were available during my care of the patient were reviewed by me and considered in my medical decision making (see chart for details).   This patient complains of chest pain.  This involves an extensive number of treatment options, and is a complaint that carries with it a high risk of complications and morbidity.  The  differential diagnosis includes ACS vs. PE vs PTX vs PNA vs reflux vs other  Labs at intake noteable for NSTEMI (trop 415), hyperkalemia (K+ 5.6)  Hgb 9.5 today.  Hx of anemia requiring transfusion but this appeared ot be multifactorial and not specifically related to GI bleed that I can find from reviewing his last hospitalization.  I think the benefits of heparin outweigh the risks of acute bleeding at this time, as there is no evidence that his NSTEMI is 2/2 acute anemia as was felt to be the case in Jan 2021 (Per Dr Cristy Friedlander Feb 2021 office note).  Patient is poor candidate for cath with his kidney function Cr close to his baseline over the past month  I ordered, reviewed, and interpreted labs, which included cardiac enzymes, BMP, CBC I ordered medication IV heparin and IV nitro for NSTEMI, active chest pain I ordered imaging studies which included chest xray I independently visualized and interpreted imaging which showed elevated right hemidiaphragm, no PTX, and the monitor tracing which showed NSR Previous records obtained and reviewed showing med/onc notes for bladder cancer, recent hospitalization discharge note and cardiology notes from Jan hospitalization and Feb office note from thisyear I consulted cardiology and discussed lab and  imaging findings    Clinical Course as of May 09 1806  Thu May 09, 2019  [id number] Patient cardiology group regarding patient's NSTEMI.  He has been roomed.  He reports continued 5 out of 10 chest pain.  Will order IV heparin as well as a nitroglycerin infusion as much as his blood pressure can tolerate.  Appears he was not a candidate for catheterization during his NSTEMI in January.     [MT]  0945 No ecg changes concerning for acute hyperK, I plan to give some IV fluids for his AKI which may help dilute this hyperK, I would hold off on beta agonist treatment or IV diuretics given his AKI, and likewise insulin for now with his BS of only 121.  Will monitor.      [MT]  [phone] I spoke to [place] from cardiology, they will come see patient   [MT]  1106 Trop minor increase from 415-> 437   [MT]  1152 Chest pain increasing, advised his nurse to give 4 mg morphine, nitro infusion running.  Repeat ECG with no STEMI but he continues to have some lateral lead depression in V3-V6.   [MT]  1236 Repeat ecg with no new changes, baseline wander in V2, doubt this is an acute STEMI without elevations in any other anterior leads   [MT]  1506 I spoke to Fabian Sharp the cardiology PA who is staffing the case with Dr Gilman Schmidt, and she reports they are recommenidng medical management only at this point, no cath plans, patient will need to stay on heparin for at least 2 days and also needs a repeat echo.  Given that he is on a nitro infusion, he will likely need a stepdown medical admission.  He has a hx of anemia that needs monitoring and also gross hematuria here likely 2/2 his bladder cancer.   [MT]  1534 Signed out to Dr Tamala Julian   [MT]  1542 Repeat ECG stable, no STEMI per my interpretation   [MT]    Clinical Course User Index [MT] Laycie Schriner, Carola Rhine, MD    Final Clinical Impression(s) / ED Diagnoses Final diagnoses:  NSTEMI (non-ST elevated myocardial infarction) [facility])    Rx / DC Orders ED Discharge Orders    None       Langston Masker Carola Rhine, MD 05/08/2019 [phone]

## 2019-05-09 NOTE — ED Notes (Signed)
Pt called out notifying RN that his pain has increased to 10/10. Pt visibly uncomfortable. Pt was in [place] when I arrived in the room. This RN obtained an EKG- the EKG did not capture the bigeminy. Notified MD of pt's increase in pain.

## 2019-05-09 NOTE — ED Notes (Signed)
Pt states he is chest pain free.  

## 2019-05-10 ENCOUNTER — Inpatient Hospital Stay (HOSPITAL_COMMUNITY): Payer: Medicare Other

## 2019-05-10 ENCOUNTER — Other Ambulatory Visit (HOSPITAL_COMMUNITY): Payer: Medicare Other

## 2019-05-10 DIAGNOSIS — C679 Malignant neoplasm of bladder, unspecified: Secondary | ICD-10-CM | POA: Diagnosis not present

## 2019-05-10 DIAGNOSIS — E8729 Other acidosis: Secondary | ICD-10-CM

## 2019-05-10 DIAGNOSIS — N189 Chronic kidney disease, unspecified: Secondary | ICD-10-CM | POA: Diagnosis not present

## 2019-05-10 DIAGNOSIS — I214 Non-ST elevation (NSTEMI) myocardial infarction: Secondary | ICD-10-CM | POA: Diagnosis not present

## 2019-05-10 DIAGNOSIS — N179 Acute kidney failure, unspecified: Secondary | ICD-10-CM | POA: Diagnosis not present

## 2019-05-10 DIAGNOSIS — R079 Chest pain, unspecified: Secondary | ICD-10-CM | POA: Diagnosis not present

## 2019-05-10 DIAGNOSIS — E875 Hyperkalemia: Secondary | ICD-10-CM

## 2019-05-10 DIAGNOSIS — E872 Acidosis: Secondary | ICD-10-CM

## 2019-05-10 DIAGNOSIS — R17 Unspecified jaundice: Secondary | ICD-10-CM

## 2019-05-10 LAB — COMPREHENSIVE METABOLIC PANEL
ALT: 108 U/L — ABNORMAL HIGH (ref 0–44)
AST: 262 U/L — ABNORMAL HIGH (ref 15–41)
Albumin: 2.5 g/dL — ABNORMAL LOW (ref 3.5–5.0)
Alkaline Phosphatase: 452 U/L — ABNORMAL HIGH (ref 38–126)
Anion gap: 17 — ABNORMAL HIGH (ref 5–15)
BUN: 60 mg/dL — ABNORMAL HIGH (ref 8–23)
CO2: 15 mmol/L — ABNORMAL LOW (ref 22–32)
Calcium: 8.6 mg/dL — ABNORMAL LOW (ref 8.9–10.3)
Chloride: 99 mmol/L (ref 98–111)
Creatinine, Ser: 3.21 mg/dL — ABNORMAL HIGH (ref 0.61–1.24)
GFR calc Af Amer: 22 mL/min — ABNORMAL LOW (ref >60)
GFR calc non Af Amer: 19 mL/min — ABNORMAL LOW (ref >60)
Glucose, Bld: 163 mg/dL — ABNORMAL HIGH (ref 70–99)
Potassium: 6 mmol/L — ABNORMAL HIGH (ref 3.5–5.1)
Sodium: 131 mmol/L — ABNORMAL LOW (ref 135–145)
Total Bilirubin: 4.7 mg/dL — ABNORMAL HIGH (ref 0.3–1.2)
Total Protein: 5.9 g/dL — ABNORMAL LOW (ref 6.5–8.1)

## 2019-05-10 LAB — CBC
HCT: 27.1 % — ABNORMAL LOW (ref 39.0–52.0)
Hemoglobin: 8.2 g/dL — ABNORMAL LOW (ref 13.0–17.0)
MCH: 29.5 pg (ref 26.0–34.0)
MCHC: 30.3 g/dL (ref 30.0–36.0)
MCV: 97.5 fL (ref 80.0–100.0)
Platelets: 181 10*3/uL (ref 150–400)
RBC: 2.78 MIL/uL — ABNORMAL LOW (ref 4.22–5.81)
RDW: 17.9 % — ABNORMAL HIGH (ref 11.5–15.5)
WBC: 6 10*3/uL (ref 4.0–10.5)
nRBC: 0 % (ref 0.0–0.2)

## 2019-05-10 LAB — ECHOCARDIOGRAM COMPLETE
Height: 72 in
Weight: 3488.56 oz

## 2019-05-10 LAB — GLUCOSE, CAPILLARY: Glucose-Capillary: 152 mg/dL — ABNORMAL HIGH (ref 70–99)

## 2019-05-10 LAB — HEPARIN LEVEL (UNFRACTIONATED): Heparin Unfractionated: 0.36 IU/mL (ref 0.30–0.70)

## 2019-05-10 LAB — POTASSIUM: Potassium: 5.9 mmol/L — ABNORMAL HIGH (ref 3.5–5.1)

## 2019-05-10 MED ORDER — SODIUM ZIRCONIUM CYCLOSILICATE 10 G PO PACK
10.0000 g | PACK | Freq: Every day | ORAL | Status: DC
  Filled 2019-05-10: qty 1

## 2019-05-10 MED ORDER — MORPHINE SULFATE (PF) 2 MG/ML IV SOLN: 2.0000 mg | INTRAVENOUS | Status: DC | PRN

## 2019-05-10 MED ORDER — OXYCODONE HCL 5 MG PO TABS: 5.0000 mg | ORAL_TABLET | ORAL | Status: DC | PRN

## 2019-05-10 MED ORDER — STERILE WATER FOR INJECTION IV SOLN
INTRAVENOUS | Status: DC
  Filled 2019-05-10: qty 850

## 2019-05-10 MED ORDER — LACTATED RINGERS IV BOLUS: 500.0000 mL | Freq: Once | INTRAVENOUS | Status: DC

## 2019-05-10 MED FILL — Medication: Qty: 1 | Status: AC

## 2019-05-16 ENCOUNTER — Other Ambulatory Visit: Payer: Medicare Other

## 2019-05-16 ENCOUNTER — Ambulatory Visit: Payer: Medicare Other | Admitting: Oncology

## 2019-05-16 ENCOUNTER — Ambulatory Visit: Payer: Medicare Other

## 2019-05-18 NOTE — Progress Notes (Signed)
CDS initiated. Referral [phone].

## 2019-05-18 NOTE — Progress Notes (Addendum)
Progress Note  Patient Name: Chad Yu Date of Encounter: 2019-05-29  Primary Cardiologist: Lauree Chandler, MD   Subjective   Vomited again this morning. One bout of chest pain relieved with sitting up in bed. Nitro weaned to 9 ml/hr  Inpatient Medications    Scheduled Meds: . aspirin EC  81 mg Oral Daily  . clopidogrel  75 mg Oral Daily  . insulin aspart  0-5 Units Subcutaneous QHS  . insulin aspart  0-9 Units Subcutaneous TID WC  . pantoprazole  20 mg Oral Daily  . Ensure Max Protein  11 oz Oral Daily  . sodium chloride flush  3 mL Intravenous Once   Continuous Infusions: . heparin 1,500 Units/hr (2019/05/29 0300)  . nitroGLYCERIN 30 mcg/min (May 29, 2019 0309)   PRN Meds: acetaminophen, HYDROcodone-acetaminophen, ondansetron (ZOFRAN) IV   Vital Signs    Vitals:   05/06/2019 2003 05/07/2019 2004 04/19/2019 2352 05-29-2019 0446  BP:  127/72 112/67 122/75  Pulse:  92 89 92  Resp:  20 20 20   Temp:  98 F (36.7 C) 98.6 F (37 C) 98.1 F (36.7 C)  TempSrc:  Oral Oral Oral  SpO2:  100% 98% 100%  Weight: 99.4 kg   98.9 kg  Height: 6' (1.829 m)       Intake/Output Summary (Last 24 hours) at 29-May-2019 0732 Last data filed at 05/29/19 0300 Gross per 24 hour  Intake 1607.02 ml  Output 300 ml  Net 1307.02 ml   Last 3 Weights 05/29/19 05/03/2019 04/23/2019  Weight (lbs) 218 lb 0.6 oz 219 lb 3.2 oz 230 lb  Weight (kg) 98.9 kg 99.428 kg 104.327 kg      Telemetry    Sinus rhythm in the 90s - Personally Reviewed  ECG    No tracings today - Personally Reviewed  Physical Exam   GEN: No acute distress.   Neck: No JVD Cardiac: RRR, no murmurs, rubs, or gallops.  Respiratory: Clear to auscultation bilaterally. GI: Soft, nontender, non-distended  MS: B LE edema Neuro:  Nonfocal  Psych: Normal affect   Labs    High Sensitivity Troponin:   Recent Labs  Lab 04/20/19 2114 04/20/19 2335 05/11/2019 0753 05/07/2019 0932  TROPONINIHS 21* 16 415* 437*       Chemistry Recent Labs  Lab 05/03/19 1209 05/11/2019 0753 2019/05/29 0458  NA 137 132* 131*  K 4.7 5.6* 6.0*  CL 103 98 99  CO2 18* 18* 15*  GLUCOSE 85 121* 163*  BUN 39* 47* 60*  CREATININE 2.19* 2.65* 3.21*  CALCIUM 9.0 9.3 8.6*  PROT 6.9  --  5.9*  ALBUMIN 2.4*  --  2.5*  AST 153*  --  262*  ALT 106*  --  108*  ALKPHOS 570*  --  452*  BILITOT 2.8*  --  4.7*  GFRNONAA 30* 24* 19*  GFRAA 34* 27* 22*  ANIONGAP 16* 16* 17*     Hematology Recent Labs  Lab 05/03/19 1209 05/03/19 1209 05/08/2019 0753 05/06/2019 0753 05/01/2019 2049 05/15/2019 2311 2019/05/29 0458  WBC 5.3  --  6.2  --   --   --  6.0  RBC 3.06*  --  3.17*  --   --   --  2.78*  HGB 9.1*  --  9.5*   < > 8.6* 8.3* 8.2*  HCT 30.0*   < > 31.6*   < > 28.4* 27.8* 27.1*  MCV 98.0  --  99.7  --   --   --  97.5  MCH 29.7  --  30.0  --   --   --  29.5  MCHC 30.3  --  30.1  --   --   --  30.3  RDW 15.9*  --  17.6*  --   --   --  17.9*  PLT 188  --  200  --   --   --  181   < > = values in this interval not displayed.    BNPNo results for input(s): BNP, PROBNP in the last 168 hours.   DDimer No results for input(s): DDIMER in the last 168 hours.   Radiology    CT ABDOMEN PELVIS WO CONTRAST  Result Date: 04/24/2019 CLINICAL DATA:  Metastatic bladder cancer restaging EXAM: CT ABDOMEN AND PELVIS WITHOUT CONTRAST TECHNIQUE: Multidetector CT imaging of the abdomen and pelvis was performed following the standard protocol without IV contrast. COMPARISON:  Multiple exams, including 03/11/2019 FINDINGS: Lower chest: Mild atelectasis along the right hemidiaphragm. Hepatobiliary: Hypodense heterogeneity in the liver favoring progressive malignancy given the extent of the hypodensity on today's exam compared to prior exams. For example, a lesion in the dome of the right hepatic lobe measures about 8.9 by 7.2 cm, previously about 3.6 by 3.9 cm on 03/11/2019. High density in the gallbladder suggesting sludge. Pancreas: Unremarkable  Spleen: Unremarkable Adrenals/Urinary Tract: Adrenal glands normal. Left double-J ureteral stent in place with resolution of the prior hydronephrosis. Tumor along the left posterior bladder wall noted, thickness about 1.7 cm today, previously about 1.4 cm on 03/11/2019. There is extravesical soft tissue tumor adjacent to the bladder in the UVJ with the extravesical component measuring 3.3 by 1.8 cm, previously 3.0 by 1.8 cm. This tumor abuts the left seminal vesicle possibly the left side of the prostate gland. Nonobstructive bilateral nephrolithiasis. Stomach/Bowel: Unremarkable Vascular/Lymphatic: Aortoiliac atherosclerotic vascular disease. Increasing adenopathy in the porta hepatis and retroperitoneum. Index porta hepatis node 1.6 cm in short axis on image 32/2, formerly 1.2 cm. Index left retroperitoneal node 1.8 cm in short axis on image 45/2, previously 0.7 cm. Left obturator node 1.2 cm in short axis on image 76/2, formerly 0.4 cm. A separate left external iliac node measures 1.5 cm in short axis on image 74/2, previously 1.3 cm by my measurements. Reproductive: As noted above, extravesical tumor along the left side of the urinary bladder abuts the left seminal vesicle and possibly the left side of the prostate gland. Other: New small amount of perihepatic ascites and trace perisplenic ascites. Small amount of pelvic ascites noted. Musculoskeletal: Increase in size of the lytic bone lesion in the ischium with some early cortical breakthrough anteriorly and laterally, lesion measures 2.9 by 2.5 cm on image 91/2 (formerly 1.9 by 1.4 cm.). Increase conspicuity of 0.9 cm lytic lesion in the left iliac bone, image 68/2. Postoperative findings in the lumbar spine with transitional S1 segment and PLIF at L4-L5-S1. Anterolisthesis at L4-5, fused in position. IMPRESSION: 1. Overall worsening malignancy with increase in size of hepatic metastatic lesions; worsening abdominal and pelvic adenopathy; increase in tumor  along the left side of the urinary bladder potentially with involvement of the seminal vesicle/prostate; and increase in size of the lytic bone lesions in the left iliac bone and ischium. 2. Left double-J ureteral stent with resolution of the prior hydronephrosis. 3. Nonobstructive bilateral nephrolithiasis. 4. New small amount of perihepatic ascites and trace perisplenic ascites. Small amount of pelvic ascites. 5. Aortic atherosclerosis. Aortic Atherosclerosis (ICD10-I70.0). Electronically Signed   By: Cindra Eves.D.  On: 05/08/2019 08:12   DG Chest 2 View  Result Date: 05/07/2019 CLINICAL DATA:  Right-sided chest pain. EXAM: CHEST - 2 VIEW COMPARISON:  02/03/2019 FINDINGS: Moderate eventration of the right hemidiaphragm with overlying vascular crowding and streaky atelectasis. No infiltrates or effusions. The cardiac silhouette, mediastinal and hilar contours are within normal limits and stable. Mild calcification of the thoracic aorta. The bony thorax is intact. IMPRESSION: No acute cardiopulmonary findings. Electronically Signed   By: Marijo Sanes M.D.   On: 04/27/2019 08:31    Cardiac Studies   Echo pending  Echo 03/2019: 1. Left ventricular ejection fraction, by estimation, is 55 to 60%. The  left ventricle has normal function. The left ventricle has no regional  wall motion abnormalities. There is moderate left ventricular hypertrophy.  Left ventricular diastolic  parameters are consistent with Grade I diastolic dysfunction (impaired  relaxation).  Patient Profile     69 y.o. male with a hx of CAD with prior PCI, CKD stage III, bladder cancer with liver mets, HTN, DM, and HLD who is being seen for chest pain.  Assessment & Plan    CAD NSTEMI Hx of mildly reduced EF (45-50%), normalized on echo 03/2019 - CE mild and flat - suspect demand ischemia in the setting of dehydration and persistent vomiting for ~12 hrs, TTE shows EF 50-55%. Having hematuria and dropg in Hgb, will  discontinue heparin gtt - will continue ASA, plavix, imdur 120 mg, and BB - nitro has been weaned to 20 mcg/min - He had a bout of chest pain this morning that improved when he sat up in bed   Acute on CKD Hyperkalemia Dehydration after prolonged vomiting following PO CT contrast - sCr 3.21 (2.65) - baseline creatinine 1.8-2 - K has increased to 6.0 from 5.6 - pt continues to vomit - appreciate primary help to lower K   Vomiting - patient was able to eat a small dinner last night, but unfortunately vomited again early this morning - suspect he needs gentle hydration and scheduled zofran  - consider reglan?   Anemia - secondary to ongoing chemotherapy and CKD - Hb drifting down to 8.2 (9.5) - PLT 181 (200) - no active bleeding - monitor closely - will try to keep heparin drip running if possible   Hyperlipidemia - on repatha, intolerant to statins   I called his wife with an update. Echo pending today.     For questions or updates, please contact [place] Please consult [email or web address] for contact info under        Signed, Ledora Bottcher, PA  May 29, 2019, 7:32 AM    Patient seen and examined.  Agree with above documentation.On exam, patient is alert and oriented, regular rate and rhythm, no murmurs, lungs CTAB, 1+ LE edema, no JVD.  Telemetry personally reviewed and shows NSR with rate 80-90s, NSVT up to 9 beats.  TTE today shows LVEF 50 to 55%, normal RV function, no significant valvular disease.  Labs notable for worsening renal function (creatinine 2.65 > 3.21), potassium 6.0, hemoglobin 9.5 >8.2, platelets 181.  Suspect his troponin elevation represents demand ischemia in setting of acute illness with nausea/vomiting and worsening renal function.  Not a candidate for invasive evaluation given stage IV cancer and worsening renal function.  Currently chest pain free, will wean nitroglycerin drip as tolerated.  Given no significant WMA on TTE to suggest MI, and  having hematuria, will stop heparin gtt.  He has LE edema on exam but suspect this is  2/2 hypoalbuminemia; no JVD, lungs are clear, and IVC small/collapsible on echo.  Suspect he is dry 2/2 persistent vomiting, agree with IV fluids for worsening renal function.  Donato Heinz, MD

## 2019-05-18 NOTE — Progress Notes (Signed)
  Echocardiogram 2D Echocardiogram has been performed.  Jannett Celestine 05/14/2019, 9:49 AM

## 2019-05-18 NOTE — Progress Notes (Signed)
PROGRESS NOTE    Chad Yu  [id number] DOB: [birth year] DOA: 05/11/2019 PCP: [name], MD   Chief Complaint  Patient presents with  . Chest Pain   Brief Narrative:  Chad Yu is Chad Yu 69 y.o. male with medical history significant of bladder cancer on chemotherapy, CKD, anemia, CAD s/p PCI, HTN, and DM type II presented with complaints of chest pain.  Patient had just been hospitalized earlier this month with initial complaints of chest pain which resolved prior to arrival after receiving nitroglycerin.  He was seen to have have acute renal failure superimposed on chronic kidney disease secondary to Tinna Kolker obstruction related with metastatic bladder cancer.  Urology had evaluated the patient and he underwent left ureteral stent placement.  Patient had undergone Pollyanna Levay repeat CT scan of the abdomen and pelvis yesterday which showed worsening malignancy with increased size of hepatic lesions, worsening abdominal/pelvic adenopathy, increased tumor size involving the bladder, and increase in lytic lesions.  He had recurrence of chest pain last night and took nitroglycerin 3 times throughout the night with intermittent relief in symptoms.  Patient reports having Remonia Otte sharp shooting pain in his abdomen that persists related to his cancer as well as left foot numbness.  He has also been having hematuria, but feels dehydrated.  He reports that he is only able to eat soft foods.  When he has time he would like to have Chelsia Serres tube placed to give him food so he does not have to die hungry.  ED Course: Upon admission into the emergency department patient was seen to be mildly tachypneic, and all other vital signs relatively within normal limits.  Labs significant for hemoglobin 9.5, sodium 132, potassium 5.6, BUN 47, creatinine 2.56, troponin  415->437.  Chest x-ray was clear of any acute findings.  Cardiology had been consulted for patient's complaints of chest pain.  He was placed on Joangel Vanosdol nitroglycerin drip as well as  heparin drip due to continued complaints of chest pain.  Assessment & Plan:   Principal Problem:   NSTEMI (non-ST elevated myocardial infarction) ([facility]) Active Problems:   Type 2 diabetes with nephropathy (HCC)   Essential hypertension   CAD, NATIVE VESSEL   GERD   Bladder cancer ([place])   Acute kidney injury superimposed on chronic kidney disease (HCC)   Hyponatremia  NSTEMI Chest pain CAD: Acute.  Patient sent with complaints of chest pain only reviewed.  Labs significant for elevated troponin 415->437.  Patient had been started on Tyriana Helmkamp heparin drip.  Cardiology was consulted and recommended continue on heparin drip for 48 hours as no acute intervention warranted at this time given worsening of kidney function and metastatic disease.  Pain currently on controlled with nitroglycerin drip. Suspect secondary to demand. -Continue heparin drip -Continue nitroglycerin drip -Continue aspirin, Plavix, imdur, beta blocker -Follow-up echocardiogram - pending -Appreciate cardiology consultative services  Acute kidney injury superimposed on chronic kidney disease stage IIIB  Anion Gap Metabolic Acidosis: Patient notes with creatinine elevated up to 2.65 with BUN 47.  At discharge from the hospital on 4/16 creatinine was 2.19. FeNa 0.05% suggesting prerenal cause of symptoms. - Worsening kidney function today, creatinine to 3.21 - Continue IVF -> will give via IV - suspect the metabolic acidosis is related to his AKI   Hyperkalemia: worsening today to 6 -repeat K stat -start lokelma -IV fluids as seen above  Anemia of chronic blood loss  Hematuria: Acute on chronic.  Patient reports continued hematuria.  Hemoglobin appears slightly improved to 9.5,  but suspect this is secondary to hemoconcentration from dehydration.  He has required several blood transfusions in the past.  Patient was placed on heparin drip for his NSTEMI. -Type and screen for possible need of blood products -Serial monitoring  of H&H - relatively stable overnight on heparin -Consider discontinuing heparin drip for gross blood loss. -Transfuse blood products as needed  Hematuria: UA with >50 RBC's.  Follow closely with heparin above.  Also has elevated bilirubin which could be making his urine darker.    Elevated Liver Enzymes  Elevated Bilirubin  Jaundice: bilirubin 4.7 today, AST 262, ALT 108.   Scleral icterus on exam. CT with hypodense hyerogeneity in liver favoring progressive malignancy, high desnity in gallbladder suggesting sludge Will follow RUQ [place] Follow with IVF    Metastatic bladder cancer: Patient on palliative chemotherapy in the outpatient setting.  Recent CT scan of the abdomen on 4/21 showing progression of metastatic disease.  Patient followed by Dr. Alen Blew in outpatient setting.  - Dr. Alen Blew notified of his admission, appreciate assistance  Systolic CHF: Last EF noted to be 45 to 50%.  Patient appears to be hypovolemic -Continue to monitor intake and output  Essential hypertension -Hold home blood pressure medications due to soft pressures  Insulin-dependent diabetes mellitus with hyperglycemia: Last hemoglobin A1c noted to be 7.4 in January 2021.  Patient reports poor p.o. intake with blood sugars the lower limits of normal. -CBGs before every meal with sensitive SSI  Hyponatremia: Acute.  On admission sodium 132.  Suspecting secondary to dehydration and poor oral intake. -mild, continue to monitor  Bilateral nephrolithiasis: Patient underwent double-J stent ureteral stent placement on 4/6.  Repeat CT scan showing resolution of hydronephrosis. -Continue outpatient follow-up with Dr. Karsten Ro  GERD -Continue Protonix  DO NOT RESUSCITATE: Present on admission  DVT prophylaxis: heparin gtt Code Status: DNR Family Communication: none at bedside Disposition:   Status is: Inpatient  Remains inpatient appropriate because:Inpatient level of care appropriate due to severity of  illness   Dispo: The patient is from: Home              Anticipated d/c is to: Home              Anticipated d/c date is: 3 days              Patient currently is not medically stable to d/c.   Consultants:   Oncology  cardiology  Procedures:  Echo pending  Antimicrobials:  Anti-infectives (From admission, onward)   None     Subjective: C/o lower abdomen pain  Objective: Vitals:   05/15/2019 2003 04/22/2019 2004 05/16/2019 2352 06/05/2019 0446  BP:  127/72 112/67 122/75  Pulse:  92 89 92  Resp:  20 20 20   Temp:  98 F (36.7 C) 98.6 F (37 C) 98.1 F (36.7 C)  TempSrc:  Oral Oral Oral  SpO2:  100% 98% 100%  Weight: 99.4 kg   98.9 kg  Height: 6' (1.829 m)       Intake/Output Summary (Last 24 hours) at 06-05-2019 0937 Last data filed at Jun 05, 2019 0700 Gross per 24 hour  Intake 1699.36 ml  Output 300 ml  Net 1399.36 ml   Filed Weights   05/06/2019 0744 05/06/2019 2003 Jun 05, 2019 0446  Weight: 104.3 kg 99.4 kg 98.9 kg    Examination:  General exam: Appears calm and comfortable  Respiratory system: Clear to auscultation. Respiratory effort normal. Cardiovascular system: S1 & S2 heard, RRR.  Gastrointestinal system: Abdomen is  nondistended, soft and ttp in lower abdomen Central nervous system: Alert and oriented. No focal neurological deficits. Extremities: 1+ LE edema Skin: No rashes, lesions or ulcers Psychiatry: Judgement and insight appear normal. Mood & affect appropriate.     Data Reviewed: I have personally reviewed following labs and imaging studies  CBC: Recent Labs  Lab 05/03/19 1209 05/17/2019 0753 05/17/2019 2049 04/27/2019 2311 05/18/19 0458  WBC 5.3 6.2  --   --  6.0  NEUTROABS 4.3  --   --   --   --   HGB 9.1* 9.5* 8.6* 8.3* 8.2*  HCT 30.0* 31.6* 28.4* 27.8* 27.1*  MCV 98.0 99.7  --   --  97.5  PLT 188 200  --   --  0000000    Basic Metabolic Panel: Recent Labs  Lab 05/03/19 1209 05/07/2019 0753 05-18-19 0458  NA 137 132* 131*  K 4.7 5.6* 6.0*   CL 103 98 99  CO2 18* 18* 15*  GLUCOSE 85 121* 163*  BUN 39* 47* 60*  CREATININE 2.19* 2.65* 3.21*  CALCIUM 9.0 9.3 8.6*    GFR: Estimated Creatinine Clearance: 26.4 mL/min (Bond [place]ries[place]) (by C-G formula based on SCr of 3.21 mg/dL (H)).  Liver Function Tests: Recent Labs  Lab 05/03/19 1209 05/18/2019 0458  AST 153* 262*  ALT 106* 108*  ALKPHOS 570* 452*  BILITOT 2.8* 4.7*  PROT 6.9 5.9*  ALBUMIN 2.4* 2.5*    CBG: Recent Labs  Lab 04/23/2019 2119 2019/05/18 0720  GLUCAP 154* 152*     Recent Results (from the past 240 hour(s))  Respiratory Panel by RT PCR (Flu Albina Gosney&B, Covid) - Nasopharyngeal Swab     Status: None   Collection Time: 05/05/2019 10:07 AM   Specimen: Nasopharyngeal Swab  Result Value Ref Range Status   SARS Coronavirus 2 by RT PCR NEGATIVE NEGATIVE Final    Comment: (NOTE) SARS-CoV-2 target nucleic acids are NOT DETECTED. The SARS-CoV-2 RNA is generally detectable in upper respiratoy specimens during the acute phase of infection. The lowest concentration of SARS-CoV-2 viral copies this assay can detect is 131 copies/mL. Naliyah Neth negative result does not preclude SARS-Cov-2 infection and should not be used as the sole basis for treatment or other patient management decisions. Morene Cecilio negative result may occur with  improper specimen collection/handling, submission of specimen other than nasopharyngeal swab, presence of viral mutation(s) within the areas targeted by this assay, and inadequate number of viral copies (<131 copies/mL). Cesiah Westley negative result must be combined with clinical observations, patient history, and epidemiological information. The expected result is Negative. Fact Sheet for Patients:  PinkCheek.be Fact Sheet for Healthcare Providers:  GravelBags.it This test is not yet ap proved or cleared by the [place] FDA and  has been authorized for detection and/or diagnosis of SARS-CoV-2 by FDA under an Emergency  Use Authorization (EUA). This EUA will remain  in effect (meaning this test can be used) for the duration of the COVID-19 declaration under Section 564(b)(1) of the Act, 21 U.S.C. section 360bbb-3(b)(1), unless the authorization is terminated or revoked sooner.    Influenza Gabriell Casimir by PCR NEGATIVE NEGATIVE Final   Influenza B by PCR NEGATIVE NEGATIVE Final    Comment: (NOTE) The Xpert Xpress SARS-CoV-2/FLU/RSV assay is intended as an aid in  the diagnosis of influenza from Nasopharyngeal swab specimens and  should not be used as Matyas Baisley sole basis for treatment. Nasal washings and  aspirates are unacceptable for Xpert Xpress SARS-CoV-2/FLU/RSV  testing. Fact Sheet for Patients: PinkCheek.be Fact Sheet for  Healthcare Providers: GravelBags.it This test is not yet approved or cleared by the [place] and  has been authorized for detection and/or diagnosis of SARS-CoV-2 by  FDA under an Emergency Use Authorization (EUA). This EUA will remain  in effect (meaning this test can be used) for the duration of the  Covid-19 declaration under Section 564(b)(1) of the Act, 21  U.S.C. section 360bbb-3(b)(1), unless the authorization is  terminated or revoked. Performed at [facility], [place] [address] [place], [place] [address]          Radiology Studies: CT ABDOMEN PELVIS WO CONTRAST  Result Date: 04/22/2019 CLINICAL DATA:  Metastatic bladder cancer restaging EXAM: CT ABDOMEN AND PELVIS WITHOUT CONTRAST TECHNIQUE: Multidetector CT imaging of the abdomen and pelvis was performed following the standard protocol without IV contrast. COMPARISON:  Multiple exams, including 03/11/2019 FINDINGS: Lower chest: Mild atelectasis along the right hemidiaphragm. Hepatobiliary: Hypodense heterogeneity in the liver favoring progressive malignancy given the extent of the hypodensity on today's exam compared to prior exams. For example, Idalie Canto lesion in the  dome of the right hepatic lobe measures about 8.9 by 7.2 cm, previously about 3.6 by 3.9 cm on 03/11/2019. High density in the gallbladder suggesting sludge. Pancreas: Unremarkable Spleen: Unremarkable Adrenals/Urinary Tract: Adrenal glands normal. Left double-J ureteral stent in place with resolution of the prior hydronephrosis. Tumor along the left posterior bladder wall noted, thickness about 1.7 cm today, previously about 1.4 cm on 03/11/2019. There is extravesical soft tissue tumor adjacent to the bladder in the UVJ with the extravesical component measuring 3.3 by 1.8 cm, previously 3.0 by 1.8 cm. This tumor abuts the left seminal vesicle possibly the left side of the prostate gland. Nonobstructive bilateral nephrolithiasis. Stomach/Bowel: Unremarkable Vascular/Lymphatic: Aortoiliac atherosclerotic vascular disease. Increasing adenopathy in the porta hepatis and retroperitoneum. Index porta hepatis node 1.6 cm in short axis on image 32/2, formerly 1.2 cm. Index left retroperitoneal node 1.8 cm in short axis on image 45/2, previously 0.7 cm. Left obturator node 1.2 cm in short axis on image 76/2, formerly 0.4 cm. Ruhani Umland separate left external iliac node measures 1.5 cm in short axis on image 74/2, previously 1.3 cm by my measurements. Reproductive: As noted above, extravesical tumor along the left side of the urinary bladder abuts the left seminal vesicle and possibly the left side of the prostate gland. Other: New small amount of perihepatic ascites and trace perisplenic ascites. Small amount of pelvic ascites noted. Musculoskeletal: Increase in size of the lytic bone lesion in the ischium with some early cortical breakthrough anteriorly and laterally, lesion measures 2.9 by 2.5 cm on image 91/2 (formerly 1.9 by 1.4 cm.). Increase conspicuity of 0.9 cm lytic lesion in the left iliac bone, image 68/2. Postoperative findings in the lumbar spine with transitional S1 segment and PLIF at L4-L5-S1. Anterolisthesis at  L4-5, fused in position. IMPRESSION: 1. Overall worsening malignancy with increase in size of hepatic metastatic lesions; worsening abdominal and pelvic adenopathy; increase in tumor along the left side of the urinary bladder potentially with involvement of the seminal vesicle/prostate; and increase in size of the lytic bone lesions in the left iliac bone and ischium. 2. Left double-J ureteral stent with resolution of the prior hydronephrosis. 3. Nonobstructive bilateral nephrolithiasis. 4. New small amount of perihepatic ascites and trace perisplenic ascites. Small amount of pelvic ascites. 5. Aortic atherosclerosis. Aortic Atherosclerosis (ICD10-I70.0). Electronically Signed   By: Van Clines M.D.   On: 05/03/2019 08:12   DG Chest 2 View  Result Date: 05/02/2019 CLINICAL DATA:  Right-sided chest pain. EXAM: CHEST - 2 VIEW COMPARISON:  02/03/2019 FINDINGS: Moderate eventration of the right hemidiaphragm with overlying vascular crowding and streaky atelectasis. No infiltrates or effusions. The cardiac silhouette, mediastinal and hilar contours are within normal limits and stable. Mild calcification of the thoracic aorta. The bony thorax is intact. IMPRESSION: No acute cardiopulmonary findings. Electronically Signed   By: Marijo Sanes M.D.   On: 05/03/2019 08:31        Scheduled Meds: . aspirin EC  81 mg Oral Daily  . clopidogrel  75 mg Oral Daily  . insulin aspart  0-5 Units Subcutaneous QHS  . insulin aspart  0-9 Units Subcutaneous TID WC  . pantoprazole  20 mg Oral Daily  . Ensure Max Protein  11 oz Oral Daily  . sodium chloride flush  3 mL Intravenous Once   Continuous Infusions: . heparin 1,500 Units/hr (05-12-2019 0700)  . lactated ringers    . nitroGLYCERIN 30 mcg/min (May 12, 2019 0739)  .  sodium bicarbonate (isotonic) infusion in sterile water       LOS: 1 day    Time spent: over 30 min    Fayrene Helper, MD [facility]   To contact the attending provider  between 7A-7P or the covering provider during after hours 7P-7A, please log into the web site [email or web address] and access using universal [place] password for that web site. If you do not have the password, please call the hospital operator.  05-12-19, 9:37 AM

## 2019-05-18 NOTE — Significant Event (Addendum)
Code called at 11:25AM.  Patient became unresponsive and pulseless.  On telemetry, initial rhythm appeared to be polymorphic VT, which degenerated into VF.  Chest compressions were briefly initiated, but it was confirmed that patient's code status is DNR, and no further resuscitative measures were taken per patient's previously expressed wishes.  Time of death 11:28AM.  Patient's primary team, Dr Marcelline Deist, was notified.  I spoke with patient's wife, Raquel Sarna.  She is on her way to the hospital.  Donato Heinz, MD

## 2019-05-18 NOTE — Death Summary Note (Signed)
DEATH SUMMARY   Patient Details  Name: Chad Yu MRN: [id number] DOB: [birth year]  Admission/Discharge Information   Admit Date:  Jun 06, 2019  Date of Death: Date of Death: 07-Jun-2019  Time of Death: Time of Death: 05/08/1126  Length of Stay: 1  Referring Physician: [name], MD   Reason(s) for Hospitalization  chest pain, NSTEMI  Diagnoses  Preliminary cause of death:  Secondary Diagnoses (including complications and co-morbidities):  Principal Problem:   NSTEMI (non-ST elevated myocardial infarction) [facility]) Active Problems:   Type 2 diabetes with nephropathy (Chad Yu)   Essential hypertension   CAD, NATIVE VESSEL   GERD   Bladder cancer ([facility])   Acute kidney injury superimposed on chronic kidney disease [facility])   Hyponatremia   Brief Hospital Course (including significant findings, care, treatment, and services provided and events leading to death)  Chad Yu is Chad Yu 69 y.o. year old male who has Chad Yu medical history significant ofbladder cancer on chemotherapy, CKD, anemia, CAD s/pPCI, HTN, andDM type II presented with complaints of chest pain. Patient had just been hospitalized earlier this month with initial complaints of chest pain which resolved prior to arrival after receiving nitroglycerin. He was seen to have have acute renal failure superimposed on chronic kidney disease secondary to Chad Yu obstruction related with metastatic bladder cancer. Urology had evaluated the patient and he underwent left ureteral stent placement. Patient had undergone Chad Yu repeat CT scan of the abdomen and pelvis yesterday which showed worsening malignancy with increased size of hepatic lesions, worsening abdominal/pelvic adenopathy, increased tumor size involving the bladder, and increase in lyticlesions. He had recurrence of chest pain last night and took nitroglycerin 3 times throughout the night with intermittent relief in symptoms. Patient reports having Chad Yu sharp shooting pain in his abdomen that  persists related to his cancer as well as left foot numbness. He has also been having hematuria, but feels dehydrated. He reports that he is only able to eat soft foods.  He was admitted with an NSTEMI, cardiology was consulted and recommended medical management with heparin gtt and nitro gtt.  He had worsening AKI on hospital day 1 as well as worsening hyperbilirubinemia with jaundice and hyperkalemia 6 -> 5.9.  He had echo and RUQ [place] on 2022-06-07.  Concern for hematuria, but Hb was relatively stable from 06-Jun-2022 PM to 2022-06-07 AM.  Unfortunately after returning from RUQ [place], he became unresponsive and pulseless with initial rhythm which per cards appeared to be polymorphic VT which degenerated into v fib.  Chest compressions were briefly initiated, but patient's code status confirmed to be DNR and no additional resuscitative measures were taken per patient wishes.  He passed away at 11:28 AM.  I spoke with his wife and son on their arrival, condolences given and answered questions.  See previous notes for additional details    Pertinent Labs and Studies  Significant Diagnostic Studies CT ABDOMEN PELVIS WO CONTRAST  Result Date: 2019-06-06 CLINICAL DATA:  Metastatic bladder cancer restaging EXAM: CT ABDOMEN AND PELVIS WITHOUT CONTRAST TECHNIQUE: Multidetector CT imaging of the abdomen and pelvis was performed following the standard protocol without IV contrast. COMPARISON:  Multiple exams, including 03/11/2019 FINDINGS: Lower chest: Mild atelectasis along the right hemidiaphragm. Hepatobiliary: Hypodense heterogeneity in the liver favoring progressive malignancy given the extent of the hypodensity on today's exam compared to prior exams. For example, [name] lesion in the dome of the right hepatic lobe measures about 8.9 by 7.2 cm, previously about 3.6 by 3.9 cm on 03/11/2019. High density in the  gallbladder suggesting sludge. Pancreas: Unremarkable Spleen: Unremarkable Adrenals/Urinary Tract: Adrenal glands normal. Left  double-J ureteral stent in place with resolution of the prior hydronephrosis. Tumor along the left posterior bladder wall noted, thickness about 1.7 cm today, previously about 1.4 cm on 03/11/2019. There is extravesical soft tissue tumor adjacent to the bladder in the UVJ with the extravesical component measuring 3.3 by 1.8 cm, previously 3.0 by 1.8 cm. This tumor abuts the left seminal vesicle possibly the left side of the prostate gland. Nonobstructive bilateral nephrolithiasis. Stomach/Bowel: Unremarkable Vascular/Lymphatic: Aortoiliac atherosclerotic vascular disease. Increasing adenopathy in the porta hepatis and retroperitoneum. Index porta hepatis node 1.6 cm in short axis on image 32/2, formerly 1.2 cm. Index left retroperitoneal node 1.8 cm in short axis on image 45/2, previously 0.7 cm. Left obturator node 1.2 cm in short axis on image 76/2, formerly 0.4 cm. [name] separate left external iliac node measures 1.5 cm in short axis on image 74/2, previously 1.3 cm by my measurements. Reproductive: As noted above, extravesical tumor along the left side of the urinary bladder abuts the left seminal vesicle and possibly the left side of the prostate gland. Other: New small amount of perihepatic ascites and trace perisplenic ascites. Small amount of pelvic ascites noted. Musculoskeletal: Increase in size of the lytic bone lesion in the ischium with some early cortical breakthrough anteriorly and laterally, lesion measures 2.9 by 2.5 cm on image 91/2 (formerly 1.9 by 1.4 cm.). Increase conspicuity of 0.9 cm lytic lesion in the left iliac bone, image 68/2. Postoperative findings in the lumbar spine with transitional S1 segment and PLIF at L4-L5-S1. Anterolisthesis at L4-5, fused in position. IMPRESSION: 1. Overall worsening malignancy with increase in size of hepatic metastatic lesions; worsening abdominal and pelvic adenopathy; increase in tumor along the left side of the urinary bladder potentially with involvement of  the seminal vesicle/prostate; and increase in size of the lytic bone lesions in the left iliac bone and ischium. 2. Left double-J ureteral stent with resolution of the prior hydronephrosis. 3. Nonobstructive bilateral nephrolithiasis. 4. New small amount of perihepatic ascites and trace perisplenic ascites. Small amount of pelvic ascites. 5. Aortic atherosclerosis. Aortic Atherosclerosis (ICD10-I70.0). Electronically Signed   By: Van Clines M.D.   On: 04/24/2019 08:12   DG Chest 2 View  Result Date: 05/17/2019 CLINICAL DATA:  Right-sided chest pain. EXAM: CHEST - 2 VIEW COMPARISON:  02/03/2019 FINDINGS: Moderate eventration of the right hemidiaphragm with overlying vascular crowding and streaky atelectasis. No infiltrates or effusions. The cardiac silhouette, mediastinal and hilar contours are within normal limits and stable. Mild calcification of the thoracic aorta. The bony thorax is intact. IMPRESSION: No acute cardiopulmonary findings. Electronically Signed   By: Marijo Sanes M.D.   On: 04/22/2019 08:31   US Renal  Result Date: 04/20/2019 CLINICAL DATA:  Hydronephrosis. EXAM: RENAL / URINARY TRACT ULTRASOUND COMPLETE COMPARISON:  None. FINDINGS: Right Kidney: Renal measurements: 16.2 x 5.1 x 6.6 cm = volume: 177 mL. There is no hydronephrosis. There is Rosalina Dingwall 13 mm stone in the upper pole. Left Kidney: Renal measurements: 12.1 x 7.5 x 6.6 cm = volume: 380 mL. There is mild to moderate left-sided hydroureteronephrosis. There is an 8 mm nonobstructing stone in the interpolar region. Bladder: The ureteral jets were not visualized. Other: The spleen is significantly enlarged measuring approximately 782 mL in volume. IMPRESSION: 1. Mild-to-moderate left-sided hydronephrosis. 2. Bilateral nonobstructing nephroliths are noted. 3. The bilateral ureteral jets were not visualized however this may be secondary to underdistention. 4.  Incidentally noted splenomegaly. Electronically Signed   By: Constance Holster  M.D.   On: 04/20/2019 23:26   DG C-Arm 1-60 Min-No Report  Result Date: 04/23/2019 Fluoroscopy was utilized by the requesting physician.  No radiographic interpretation.   ECHOCARDIOGRAM COMPLETE  Result Date: 2019-05-14    ECHOCARDIOGRAM REPORT   Patient Name:   MARVION MICHALAK Date of Exam: 05/14/19 Medical Rec #:  [id number]       Height:       72.0 in Accession #:    [id number]      Weight:       218.0 lb Date of Birth:  1950/02/09      BSA:          2.210 m Patient Age:    69 years        BP:           122/75 mmHg Patient Gender: M               HR:           92 bpm. Exam Location:  Inpatient Procedure: 2D Echo Indications:    786.50 chest pain  History:        Patient has prior history of Echocardiogram examinations, most                 recent 03/19/2019. CAD; Risk Factors:Hypertension, Diabetes and                 Dyslipidemia. Cancer.  Sonographer:    Jannett Celestine RDCS (AE) Referring Phys: [phone] RONDELL Eulonda Andalon SMITH IMPRESSIONS  1. Distal septal and apical hypokinesis poor image quality consider definity or cardiac MRI to better judge RWMAls and EF . Left ventricular ejection fraction, by estimation, is 50 to 55%. The left ventricle has low normal function. The left ventricle demonstrates regional wall motion abnormalities (see scoring diagram/findings for description). The left ventricular internal cavity size was mildly dilated. There is mild left ventricular hypertrophy. Left ventricular diastolic parameters were normal.  2. Right ventricular systolic function is normal. The right ventricular size is normal. There is normal pulmonary artery systolic pressure.  3. Left atrial size was moderately dilated.  4. The mitral valve is normal in structure. No evidence of mitral valve regurgitation. No evidence of mitral stenosis.  5. The aortic valve was not well visualized. Aortic valve regurgitation is not visualized. Mild to moderate aortic valve sclerosis/calcification is present, without any evidence of  aortic stenosis.  6. The inferior vena cava is normal in size with greater than 50% respiratory variability, suggesting right atrial pressure of 3 mmHg. FINDINGS  Left Ventricle: Distal septal and apical hypokinesis poor image quality consider definity or cardiac MRI to better judge RWMAls and EF. Left ventricular ejection fraction, by estimation, is 50 to 55%. The left ventricle has low normal function. The left  ventricle demonstrates regional wall motion abnormalities. The left ventricular internal cavity size was mildly dilated. There is mild left ventricular hypertrophy. Left ventricular diastolic parameters were normal. Right Ventricle: The right ventricular size is normal. No increase in right ventricular wall thickness. Right ventricular systolic function is normal. There is normal pulmonary artery systolic pressure. Left Atrium: Left atrial size was moderately dilated. Right Atrium: Right atrial size was normal in size. Pericardium: There is no evidence of pericardial effusion. Mitral Valve: The mitral valve is normal in structure. Normal mobility of the mitral valve leaflets. No evidence of mitral valve regurgitation. No evidence of mitral valve stenosis. Tricuspid Valve:  The tricuspid valve is normal in structure. Tricuspid valve regurgitation is not demonstrated. No evidence of tricuspid stenosis. Aortic Valve: The aortic valve was not well visualized. Aortic valve regurgitation is not visualized. Mild to moderate aortic valve sclerosis/calcification is present, without any evidence of aortic stenosis. Pulmonic Valve: The pulmonic valve was normal in structure. Pulmonic valve regurgitation is trivial. No evidence of pulmonic stenosis. Aorta: The aortic root is normal in size and structure. Venous: The inferior vena cava is normal in size with greater than 50% respiratory variability, suggesting right atrial pressure of 3 mmHg. IAS/Shunts: No atrial level shunt detected by color flow Doppler.  LEFT  VENTRICLE PLAX 2D LVIDd:         4.10 cm  Diastology LVIDs:         2.80 cm  LV e' lateral:   12.50 cm/s LV PW:         1.20 cm  LV E/e' lateral: 4.5 LV IVS:        1.30 cm  LV e' medial:    6.42 cm/s LVOT diam:     2.30 cm  LV E/e' medial:  8.7 LV SV:         59 LV SV Index:   27 LVOT Area:     4.15 cm  LEFT ATRIUM              Index LA diam:        4.50 cm  2.04 cm/m LA Vol (A2C):   100.5 ml 45.47 ml/m LA Vol (A4C):   39.6 ml  17.92 ml/m LA Biplane Vol: 67.7 ml  30.63 ml/m  AORTIC VALVE LVOT Vmax:   73.10 cm/s LVOT Vmean:  52.700 cm/s LVOT VTI:    0.142 m  AORTA Ao Root diam: 3.40 cm MITRAL VALVE MV Area (PHT): 3.03 cm    SHUNTS MV Decel Time: 250 msec    Systemic VTI:  0.14 m MV E velocity: 55.70 cm/s  Systemic Diam: 2.30 cm MV Toy Eisemann velocity: 83.60 cm/s MV E/Deone Leifheit ratio:  0.67 Jenkins Rouge MD Electronically signed by Jenkins Rouge MD Signature Date/Time: 05-23-2019/10:18:35 AM    Final    US Abdomen Limited RUQ  Result Date: 2019-05-23 CLINICAL DATA:  Elevated bilirubin.  Metastatic bladder cancer. EXAM: ULTRASOUND ABDOMEN LIMITED RIGHT UPPER QUADRANT COMPARISON:  None. FINDINGS: Gallbladder: The gallbladder wall measures 8.1 mm. No stones, sludge, or pericholecystic fluid. No Murphy's sign. Common bile duct: Diameter: 3.1 mm Liver: Heterogeneous increased echotexture. No focal mass. Portal vein is patent on color Doppler imaging with normal direction of blood flow towards the liver. Other: Mild perihepatic ascites. IMPRESSION: 1. The gallbladder wall is thickened 8.1 mm. No stones, sludge, or pericholecystic fluid. No Murphy's sign. Recommend clinical correlation. 2. No common bile duct dilatation. 3. Heterogeneous increased echotexture in the liver, nonspecific. 4. Mild perihepatic ascites of uncertain etiology. Electronically Signed   By: Dorise Bullion III M.D   On: 05/23/2019 14:30    Microbiology Recent Results (from the past 240 hour(s))  Respiratory Panel by RT PCR (Flu Canio Winokur&B, Covid) - Nasopharyngeal  Swab     Status: None   Collection Time: 04/25/2019 10:07 AM   Specimen: Nasopharyngeal Swab  Result Value Ref Range Status   SARS Coronavirus 2 by RT PCR NEGATIVE NEGATIVE Final    Comment: (NOTE) SARS-CoV-2 target nucleic acids are NOT DETECTED. The SARS-CoV-2 RNA is generally detectable in upper respiratoy specimens during the acute phase of infection. The lowest concentration of SARS-CoV-2 viral  copies this assay can detect is 131 copies/mL. Dimitra Woodstock negative result does not preclude SARS-Cov-2 infection and should not be used as the sole basis for treatment or other patient management decisions. Zailah Zagami negative result may occur with  improper specimen collection/handling, submission of specimen other than nasopharyngeal swab, presence of viral mutation(s) within the areas targeted by this assay, and inadequate number of viral copies (<131 copies/mL). Cyndra Feinberg negative result must be combined with clinical observations, patient history, and epidemiological information. The expected result is Negative. Fact Sheet for Patients:  PinkCheek.be Fact Sheet for Healthcare Providers:  GravelBags.it This test is not yet ap proved or cleared by the [place] FDA and  has been authorized for detection and/or diagnosis of SARS-CoV-2 by FDA under an Emergency Use Authorization (EUA). This EUA will remain  in effect (meaning this test can be used) for the duration of the COVID-19 declaration under Section 564(b)(1) of the Act, 21 U.S.C. section 360bbb-3(b)(1), unless the authorization is terminated or revoked sooner.    Influenza Armandina Iman by PCR NEGATIVE NEGATIVE Final   Influenza B by PCR NEGATIVE NEGATIVE Final    Comment: (NOTE) The Xpert Xpress SARS-CoV-2/FLU/RSV assay is intended as an aid in  the diagnosis of influenza from Nasopharyngeal swab specimens and  should not be used as Dorrine Montone sole basis for treatment. Nasal washings and  aspirates are  unacceptable for Xpert Xpress SARS-CoV-2/FLU/RSV  testing. Fact Sheet for Patients: PinkCheek.be Fact Sheet for Healthcare Providers: GravelBags.it This test is not yet approved or cleared by the [place] FDA and  has been authorized for detection and/or diagnosis of SARS-CoV-2 by  FDA under an Emergency Use Authorization (EUA). This EUA will remain  in effect (meaning this test can be used) for the duration of the  Covid-19 declaration under Section 564(b)(1) of the Act, 21  U.S.C. section 360bbb-3(b)(1), unless the authorization is  terminated or revoked. Performed at [facility], [place] [address], [place], [place] [address]     Lab Basic Metabolic Panel: Recent Labs  Lab 04/30/2019 0753 Jun 07, 2019 0458 2019-06-07 1003  NA 132* 131*  --   K 5.6* 6.0* 5.9*  CL 98 99  --   CO2 18* 15*  --   GLUCOSE 121* 163*  --   BUN 47* 60*  --   CREATININE 2.65* 3.21*  --   CALCIUM 9.3 8.6*  --    Liver Function Tests: Recent Labs  Lab Jun 07, 2019 0458  AST 262*  ALT 108*  ALKPHOS 452*  BILITOT 4.7*  PROT 5.9*  ALBUMIN 2.5*   No results for input(s): LIPASE, AMYLASE in the last 168 hours. No results for input(s): AMMONIA in the last 168 hours. CBC: Recent Labs  Lab 05/11/2019 0753 04/26/2019 2049 04/30/2019 2311 06-07-2019 0458  WBC 6.2  --   --  6.0  HGB 9.5* 8.6* 8.3* 8.2*  HCT 31.6* 28.4* 27.8* 27.1*  MCV 99.7  --   --  97.5  PLT 200  --   --  181   Cardiac Enzymes: No results for input(s): CKTOTAL, CKMB, CKMBINDEX, TROPONINI in the last 168 hours. Sepsis Labs: Recent Labs  Lab 05/16/2019 0753 2019/06/07 0458  WBC 6.2 6.0    Procedures/Operations  See previous notes   Fayrene Helper 06-07-2019, 6:04 PM

## 2019-05-18 NOTE — Progress Notes (Signed)
About 0430 patient assisted into the bathroom x1 staff member while having bowel movement had an episode of tachycardia which lasted about 4-5 minutes and resolved on it's own.  Patient asymptomatic.  RN stayed with patient during episode and assisted patient back to bed.

## 2019-05-18 NOTE — Progress Notes (Signed)
Spoke with on call for [facility] due to urine that patient voided was bloody and patient complaining of abdominal pain and requesting something for pain. Order received.

## 2019-05-18 DEATH — deceased

## 2019-05-24 ENCOUNTER — Other Ambulatory Visit: Payer: Medicare Other

## 2019-05-24 ENCOUNTER — Ambulatory Visit: Payer: Medicare Other

## 2019-05-24 ENCOUNTER — Ambulatory Visit: Payer: Medicare Other | Admitting: Oncology

## 2019-05-27 ENCOUNTER — Other Ambulatory Visit: Payer: Medicare Other

## 2019-06-03 ENCOUNTER — Ambulatory Visit: Payer: Medicare Other | Admitting: Cardiovascular Disease

## 2020-01-19 IMAGING — CT NM PET TUM IMG INITIAL (PI) SKULL BASE T - THIGH
1 of 7 series · 1 of 25 positions shown · non-contrast
Comparison: MRI abdomen 09/20/2018, CT chest 09/19/2018 in [place]
abdomen and pelvis from 09/07/2018

CLINICAL DATA: Initial treatment strategy for bladder cancer.

EXAM:
NUCLEAR MEDICINE PET SKULL BASE TO THIGH
TECHNIQUE: 11.2 mCi F-18 FDG was injected intravenously. Full-ring PET imaging
was performed from the skull base to thigh after the radiotracer. CT
data was obtained and used for attenuation correction and anatomic
localization.
Fasting blood glucose: 136 mg/dl

[Series 4: ct sk_thigh 5.0 b31f · axial · 5.0mm · 0.98mm/px · 1 of 242 slices shown]
[im 242/242  brain]
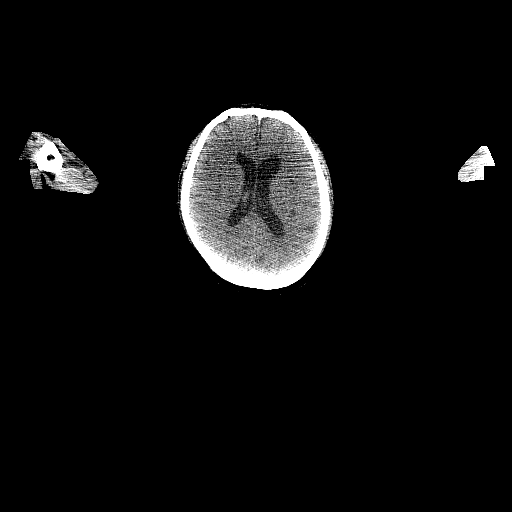

[1 of 25 positions shown; findings below may reference images not displayed]

FINDINGS: Mediastinal blood pool activity: SUV max

Liver activity: SUV max NA

There is marked diffuse physiologic skeletal muscle uptake which
diminishes exam detail.

NECK: No hypermetabolic lymph nodes in the neck.

Incidental CT findings: none

CHEST: No enlarged or FDG avid mediastinal, hilar, axillary or
supraclavicular lymph nodes.

Incidental CT findings: Aortic atherosclerosis. No aneurysm. Lad and
left circumflex coronary artery calcifications identified.

ABDOMEN/PELVIS: Lesion within segment 4 B measures 4.2 cm and has an
SUV max of 6.3. Within segment 8 there is a second low-density
lesion measuring 1.3 cm with SUV max of 2.59. liver lesions
identified.

No abnormal FDG uptake identified within pancreas, spleen, and
adrenal gland. There is an enlarged left pelvic sidewall lymph node
which measures 1.1 cm short axis and has an SUV max of 2.48. This is
new compared with 09/07/2018.

Incidental CT findings: Aortic atherosclerosis. Bilateral
nephrolithiasis. No hydronephrosis

SKELETON: No focal hypermetabolic activity to suggest skeletal
metastasis.

Incidental CT findings: Advanced lumbar spondylosis is identified.
IMPRESSION: 1. Exam detail is diminished due to diffuse physiologic skeletal
muscle uptake. This may be secondary to tracer uptake in the
nonfasting state.
2. There are 2 suspicious lesions within the liver, concerning for
metastatic disease. The largest is in segment 4 B, is hypermetabolic
and measures 4.2 cm. Smaller lesion within segment 8 measures 2.5 cm
and is also concerning for metastasis.
3. Aortic Atherosclerosis ([id number]-WUX.X). Coronary artery
calcifications.
4. Kidney stones.

## 2022-10-31 NOTE — Telephone Encounter (Signed)
ror
